# Patient Record
Sex: Female | Born: 1950 | Race: White | Hispanic: No | Marital: Married | Smoking: Former smoker
Health system: Southern US, Community
[De-identification: ages and names within clinical notes are randomized; demographics above are authoritative.]

## PROBLEM LIST (undated history)

## (undated) DIAGNOSIS — F32A Depression, unspecified: Secondary | ICD-10-CM

## (undated) DIAGNOSIS — K5790 Diverticulosis of intestine, part unspecified, without perforation or abscess without bleeding: Secondary | ICD-10-CM

## (undated) DIAGNOSIS — K219 Gastro-esophageal reflux disease without esophagitis: Secondary | ICD-10-CM

## (undated) DIAGNOSIS — M791 Myalgia, unspecified site: Secondary | ICD-10-CM

## (undated) DIAGNOSIS — M2540 Effusion, unspecified joint: Secondary | ICD-10-CM

## (undated) DIAGNOSIS — Z5189 Encounter for other specified aftercare: Secondary | ICD-10-CM

## (undated) DIAGNOSIS — IMO0001 Reserved for inherently not codable concepts without codable children: Secondary | ICD-10-CM

## (undated) DIAGNOSIS — D649 Anemia, unspecified: Secondary | ICD-10-CM

## (undated) DIAGNOSIS — F329 Major depressive disorder, single episode, unspecified: Secondary | ICD-10-CM

## (undated) DIAGNOSIS — M069 Rheumatoid arthritis, unspecified: Secondary | ICD-10-CM

## (undated) DIAGNOSIS — F419 Anxiety disorder, unspecified: Secondary | ICD-10-CM

## (undated) DIAGNOSIS — M797 Fibromyalgia: Secondary | ICD-10-CM

## (undated) DIAGNOSIS — H269 Unspecified cataract: Secondary | ICD-10-CM

## (undated) DIAGNOSIS — M7530 Calcific tendinitis of unspecified shoulder: Secondary | ICD-10-CM

## (undated) HISTORY — DX: Fibromyalgia: M79.7

## (undated) HISTORY — PX: ROTATOR CUFF REPAIR: SHX139

## (undated) HISTORY — DX: Anxiety disorder, unspecified: F41.9

## (undated) HISTORY — DX: Myalgia, unspecified site: M79.10

## (undated) HISTORY — DX: Rheumatoid arthritis, unspecified: M06.9

## (undated) HISTORY — DX: Effusion, unspecified joint: M25.40

## (undated) HISTORY — DX: Gastro-esophageal reflux disease without esophagitis: K21.9

## (undated) HISTORY — PX: OTHER SURGICAL HISTORY: SHX169

## (undated) HISTORY — PX: TUBAL LIGATION: SHX77

## (undated) HISTORY — PX: APPENDECTOMY: SHX54

## (undated) HISTORY — DX: Encounter for other specified aftercare: Z51.89

## (undated) HISTORY — DX: Anemia, unspecified: D64.9

## (undated) HISTORY — DX: Major depressive disorder, single episode, unspecified: F32.9

## (undated) HISTORY — PX: FOOT FRACTURE SURGERY: SHX645

## (undated) HISTORY — DX: Diverticulosis of intestine, part unspecified, without perforation or abscess without bleeding: K57.90

## (undated) HISTORY — PX: TOTAL ABDOMINAL HYSTERECTOMY: SHX209

## (undated) HISTORY — DX: Reserved for inherently not codable concepts without codable children: IMO0001

## (undated) HISTORY — PX: TONSILLECTOMY: SUR1361

## (undated) HISTORY — PX: REPLACEMENT TOTAL KNEE: SUR1224

## (undated) HISTORY — DX: Depression, unspecified: F32.A

## (undated) HISTORY — DX: Calcific tendinitis of unspecified shoulder: M75.30

---

## 1997-09-07 ENCOUNTER — Encounter (HOSPITAL_COMMUNITY): Admission: RE | Admit: 1997-09-07 | Discharge: 1997-12-06 | Payer: Self-pay | Admitting: Dermatology

## 1997-12-12 ENCOUNTER — Encounter (HOSPITAL_COMMUNITY): Admission: RE | Admit: 1997-12-12 | Discharge: 1998-03-12 | Payer: Self-pay | Admitting: Dermatology

## 1998-03-25 ENCOUNTER — Emergency Department (HOSPITAL_COMMUNITY): Admission: EM | Admit: 1998-03-25 | Discharge: 1998-03-25 | Payer: Self-pay | Admitting: Emergency Medicine

## 1999-05-24 ENCOUNTER — Encounter: Payer: Self-pay | Admitting: Urology

## 1999-05-24 ENCOUNTER — Observation Stay (HOSPITAL_COMMUNITY): Admission: RE | Admit: 1999-05-24 | Discharge: 1999-05-25 | Payer: Self-pay | Admitting: Urology

## 1999-08-01 ENCOUNTER — Encounter: Admission: RE | Admit: 1999-08-01 | Discharge: 1999-08-01 | Payer: Self-pay | Admitting: Unknown Physician Specialty

## 1999-09-06 ENCOUNTER — Encounter: Admission: RE | Admit: 1999-09-06 | Discharge: 1999-09-06 | Payer: Self-pay

## 2000-03-02 ENCOUNTER — Encounter: Admission: RE | Admit: 2000-03-02 | Discharge: 2000-03-02 | Payer: Self-pay

## 2000-06-08 ENCOUNTER — Encounter: Payer: Self-pay | Admitting: Orthopedic Surgery

## 2000-06-08 ENCOUNTER — Encounter: Admission: RE | Admit: 2000-06-08 | Discharge: 2000-06-08 | Payer: Self-pay | Admitting: Gastroenterology

## 2000-06-19 ENCOUNTER — Encounter (INDEPENDENT_AMBULATORY_CARE_PROVIDER_SITE_OTHER): Payer: Self-pay | Admitting: Specialist

## 2000-06-19 ENCOUNTER — Ambulatory Visit (HOSPITAL_COMMUNITY): Admission: RE | Admit: 2000-06-19 | Discharge: 2000-06-19 | Payer: Self-pay | Admitting: Oncology

## 2000-08-11 ENCOUNTER — Encounter: Admission: RE | Admit: 2000-08-11 | Discharge: 2000-08-11 | Payer: Self-pay

## 2000-11-11 ENCOUNTER — Encounter: Admission: RE | Admit: 2000-11-11 | Discharge: 2000-11-11 | Payer: Self-pay

## 2001-03-09 ENCOUNTER — Encounter: Admission: RE | Admit: 2001-03-09 | Discharge: 2001-03-09 | Payer: Self-pay

## 2001-12-03 ENCOUNTER — Encounter: Admission: RE | Admit: 2001-12-03 | Discharge: 2001-12-03 | Payer: Self-pay

## 2002-08-05 ENCOUNTER — Encounter: Admission: RE | Admit: 2002-08-05 | Discharge: 2002-08-05 | Payer: Self-pay

## 2003-02-16 ENCOUNTER — Ambulatory Visit (HOSPITAL_COMMUNITY): Admission: RE | Admit: 2003-02-16 | Discharge: 2003-02-16 | Payer: Self-pay | Admitting: Oncology

## 2003-02-16 ENCOUNTER — Encounter (INDEPENDENT_AMBULATORY_CARE_PROVIDER_SITE_OTHER): Payer: Self-pay | Admitting: Specialist

## 2003-04-17 ENCOUNTER — Encounter: Admission: RE | Admit: 2003-04-17 | Discharge: 2003-04-17 | Payer: Self-pay

## 2003-10-03 ENCOUNTER — Encounter: Admission: RE | Admit: 2003-10-03 | Discharge: 2003-10-03 | Payer: Self-pay

## 2003-12-16 ENCOUNTER — Ambulatory Visit (HOSPITAL_COMMUNITY): Admission: AD | Admit: 2003-12-16 | Discharge: 2003-12-18 | Payer: Self-pay | Admitting: Orthopedic Surgery

## 2005-02-17 ENCOUNTER — Encounter: Admission: RE | Admit: 2005-02-17 | Discharge: 2005-02-17 | Payer: Self-pay

## 2005-03-10 ENCOUNTER — Encounter: Admission: RE | Admit: 2005-03-10 | Discharge: 2005-03-10 | Payer: Self-pay

## 2005-04-21 ENCOUNTER — Emergency Department (HOSPITAL_COMMUNITY): Admission: EM | Admit: 2005-04-21 | Discharge: 2005-04-22 | Payer: Self-pay | Admitting: Emergency Medicine

## 2005-07-24 ENCOUNTER — Encounter: Admission: RE | Admit: 2005-07-24 | Discharge: 2005-07-24 | Payer: Self-pay

## 2005-10-24 ENCOUNTER — Encounter (HOSPITAL_COMMUNITY): Admission: RE | Admit: 2005-10-24 | Discharge: 2006-01-22 | Payer: Self-pay

## 2006-03-06 ENCOUNTER — Encounter (HOSPITAL_COMMUNITY): Admission: RE | Admit: 2006-03-06 | Discharge: 2006-06-04 | Payer: Self-pay

## 2006-04-06 ENCOUNTER — Ambulatory Visit: Payer: Self-pay | Admitting: Gastroenterology

## 2006-04-06 ENCOUNTER — Encounter: Admission: RE | Admit: 2006-04-06 | Discharge: 2006-04-06 | Payer: Self-pay

## 2006-04-09 ENCOUNTER — Ambulatory Visit: Payer: Self-pay | Admitting: Gastroenterology

## 2006-04-21 ENCOUNTER — Encounter: Admission: RE | Admit: 2006-04-21 | Discharge: 2006-04-21 | Payer: Self-pay

## 2006-05-11 ENCOUNTER — Ambulatory Visit: Payer: Self-pay | Admitting: Gastroenterology

## 2006-10-30 ENCOUNTER — Ambulatory Visit (HOSPITAL_BASED_OUTPATIENT_CLINIC_OR_DEPARTMENT_OTHER): Admission: RE | Admit: 2006-10-30 | Discharge: 2006-10-30 | Payer: Self-pay | Admitting: Orthopedic Surgery

## 2007-02-05 ENCOUNTER — Ambulatory Visit (HOSPITAL_COMMUNITY): Admission: RE | Admit: 2007-02-05 | Discharge: 2007-02-05 | Payer: Self-pay

## 2008-01-22 ENCOUNTER — Emergency Department (HOSPITAL_COMMUNITY): Admission: EM | Admit: 2008-01-22 | Discharge: 2008-01-22 | Payer: Self-pay | Admitting: Emergency Medicine

## 2008-04-05 ENCOUNTER — Ambulatory Visit: Payer: Self-pay | Admitting: Oncology

## 2008-04-20 LAB — CBC WITH DIFFERENTIAL/PLATELET
Basophils Absolute: 0 10*3/uL (ref 0.0–0.1)
EOS%: 0 % (ref 0.0–7.0)
Eosinophils Absolute: 0 10*3/uL (ref 0.0–0.5)
HCT: 34.9 % (ref 34.8–46.6)
HGB: 12.2 g/dL (ref 11.6–15.9)
MCH: 30.8 pg (ref 26.0–34.0)
MONO#: 0.4 10*3/uL (ref 0.1–0.9)
NEUT#: 0.3 10*3/uL — CL (ref 1.5–6.5)
NEUT%: 16.6 % — ABNORMAL LOW (ref 39.6–76.8)
RDW: 14.3 % (ref 11.3–14.5)
WBC: 1.9 10*3/uL — ABNORMAL LOW (ref 3.9–10.0)
lymph#: 1.2 10*3/uL (ref 0.9–3.3)

## 2008-04-20 LAB — MORPHOLOGY
PLT EST: ADEQUATE
RBC Comments: NORMAL

## 2008-04-24 LAB — COMPREHENSIVE METABOLIC PANEL
ALT: 21 U/L (ref 0–35)
Alkaline Phosphatase: 71 U/L (ref 39–117)
Glucose, Bld: 122 mg/dL — ABNORMAL HIGH (ref 70–99)
Sodium: 134 mEq/L — ABNORMAL LOW (ref 135–145)
Total Bilirubin: 0.5 mg/dL (ref 0.3–1.2)
Total Protein: 7.2 g/dL (ref 6.0–8.3)

## 2008-04-24 LAB — ANA: Anti Nuclear Antibody(ANA): NEGATIVE

## 2008-05-01 LAB — CBC WITH DIFFERENTIAL/PLATELET
Eosinophils Absolute: 0 10*3/uL (ref 0.0–0.5)
LYMPH%: 48.4 % — ABNORMAL HIGH (ref 14.0–48.0)
MONO#: 0.5 10*3/uL (ref 0.1–0.9)
NEUT#: 0.6 10*3/uL — ABNORMAL LOW (ref 1.5–6.5)
Platelets: 215 10*3/uL (ref 145–400)
RBC: 4.31 10*6/uL (ref 3.70–5.32)
WBC: 2.1 10*3/uL — ABNORMAL LOW (ref 3.9–10.0)

## 2008-05-03 LAB — CBC WITH DIFFERENTIAL/PLATELET
Basophils Absolute: 0 10*3/uL (ref 0.0–0.1)
Eosinophils Absolute: 0 10*3/uL (ref 0.0–0.5)
HCT: 38.2 % (ref 34.8–46.6)
LYMPH%: 59.3 % — ABNORMAL HIGH (ref 14.0–48.0)
MONO#: 0.5 10*3/uL (ref 0.1–0.9)
NEUT#: 0.6 10*3/uL — ABNORMAL LOW (ref 1.5–6.5)
NEUT%: 22.4 % — ABNORMAL LOW (ref 39.6–76.8)
Platelets: 217 10*3/uL (ref 145–400)
WBC: 2.9 10*3/uL — ABNORMAL LOW (ref 3.9–10.0)

## 2008-05-24 ENCOUNTER — Ambulatory Visit: Payer: Self-pay | Admitting: Oncology

## 2008-05-26 LAB — CBC WITH DIFFERENTIAL/PLATELET
BASO%: 0.7 % (ref 0.0–2.0)
HCT: 36.2 % (ref 34.8–46.6)
LYMPH%: 61.2 % — ABNORMAL HIGH (ref 14.0–48.0)
MCHC: 35.1 g/dL (ref 32.0–36.0)
MCV: 89.3 fL (ref 81.0–101.0)
MONO%: 18.6 % — ABNORMAL HIGH (ref 0.0–13.0)
NEUT%: 19.5 % — ABNORMAL LOW (ref 39.6–76.8)
Platelets: 199 10*3/uL (ref 145–400)
RBC: 4.06 10*6/uL (ref 3.70–5.32)

## 2008-05-31 LAB — CBC WITH DIFFERENTIAL/PLATELET
BASO%: 0.5 % (ref 0.0–2.0)
EOS%: 0 % (ref 0.0–7.0)
LYMPH%: 51.3 % — ABNORMAL HIGH (ref 14.0–48.0)
MCH: 30.9 pg (ref 26.0–34.0)
MCHC: 34.6 g/dL (ref 32.0–36.0)
MONO#: 0.2 10*3/uL (ref 0.1–0.9)
NEUT%: 39.1 % — ABNORMAL LOW (ref 39.6–76.8)
Platelets: 236 10*3/uL (ref 145–400)
RBC: 4.18 10*6/uL (ref 3.70–5.32)
WBC: 2 10*3/uL — ABNORMAL LOW (ref 3.9–10.0)
lymph#: 1 10*3/uL (ref 0.9–3.3)

## 2008-06-07 LAB — CBC WITH DIFFERENTIAL/PLATELET
BASO%: 0.6 % (ref 0.0–2.0)
EOS%: 0.1 % (ref 0.0–7.0)
HCT: 37.9 % (ref 34.8–46.6)
MCH: 31.4 pg (ref 26.0–34.0)
MCHC: 34.9 g/dL (ref 32.0–36.0)
MONO#: 0.2 10*3/uL (ref 0.1–0.9)
NEUT%: 49 % (ref 39.6–76.8)
RDW: 15.4 % — ABNORMAL HIGH (ref 11.3–14.5)
WBC: 2.4 10*3/uL — ABNORMAL LOW (ref 3.9–10.0)
lymph#: 1 10*3/uL (ref 0.9–3.3)

## 2008-06-15 LAB — COMPREHENSIVE METABOLIC PANEL
ALT: 15 U/L (ref 0–35)
AST: 12 U/L (ref 0–37)
Albumin: 4.2 g/dL (ref 3.5–5.2)
Alkaline Phosphatase: 38 U/L — ABNORMAL LOW (ref 39–117)
Potassium: 4 mEq/L (ref 3.5–5.3)
Sodium: 138 mEq/L (ref 135–145)
Total Protein: 7.2 g/dL (ref 6.0–8.3)

## 2008-06-15 LAB — CBC WITH DIFFERENTIAL/PLATELET
BASO%: 0.3 % (ref 0.0–2.0)
EOS%: 0.1 % (ref 0.0–7.0)
Eosinophils Absolute: 0 10*3/uL (ref 0.0–0.5)
MCHC: 34.6 g/dL (ref 32.0–36.0)
MCV: 90 fL (ref 81.0–101.0)
MONO%: 5.7 % (ref 0.0–13.0)
NEUT#: 3.2 10*3/uL (ref 1.5–6.5)
RBC: 4.52 10*6/uL (ref 3.70–5.32)
RDW: 14.6 % — ABNORMAL HIGH (ref 11.3–14.5)

## 2008-06-19 ENCOUNTER — Inpatient Hospital Stay (HOSPITAL_COMMUNITY): Admission: RE | Admit: 2008-06-19 | Discharge: 2008-06-22 | Payer: Self-pay | Admitting: Orthopedic Surgery

## 2008-06-29 ENCOUNTER — Ambulatory Visit: Payer: Self-pay | Admitting: Oncology

## 2008-08-01 LAB — CBC WITH DIFFERENTIAL/PLATELET
Basophils Absolute: 0 10*3/uL (ref 0.0–0.1)
EOS%: 0.2 % (ref 0.0–7.0)
HGB: 12.7 g/dL (ref 11.6–15.9)
LYMPH%: 67.1 % — ABNORMAL HIGH (ref 14.0–49.7)
MCH: 30.6 pg (ref 25.1–34.0)
MCV: 88.6 fL (ref 79.5–101.0)
MONO%: 11.7 % (ref 0.0–14.0)
RBC: 4.16 10*6/uL (ref 3.70–5.45)
RDW: 14.4 % (ref 11.2–14.5)

## 2008-08-15 ENCOUNTER — Ambulatory Visit: Payer: Self-pay | Admitting: Oncology

## 2008-08-15 LAB — CBC WITH DIFFERENTIAL/PLATELET
BASO%: 0.8 % (ref 0.0–2.0)
EOS%: 0 % (ref 0.0–7.0)
Eosinophils Absolute: 0 10*3/uL (ref 0.0–0.5)
HGB: 12.4 g/dL (ref 11.6–15.9)
LYMPH%: 38.9 % (ref 14.0–49.7)
MCV: 85.6 fL (ref 79.5–101.0)
MONO#: 0.2 10*3/uL (ref 0.1–0.9)
NEUT#: 1.2 10*3/uL — ABNORMAL LOW (ref 1.5–6.5)
NEUT%: 51.9 % (ref 38.4–76.8)
RBC: 4.18 10*6/uL (ref 3.70–5.45)
RDW: 14.5 % (ref 11.2–14.5)
WBC: 2.3 10*3/uL — ABNORMAL LOW (ref 3.9–10.3)

## 2008-08-15 LAB — COMPREHENSIVE METABOLIC PANEL
ALT: <8 U/L (ref 0–35)
AST: 10 U/L (ref 0–37)
Creatinine, Ser: 0.72 mg/dL (ref 0.40–1.20)
Total Bilirubin: 0.5 mg/dL (ref 0.3–1.2)

## 2008-09-01 LAB — CBC WITH DIFFERENTIAL/PLATELET
Basophils Absolute: 0 10*3/uL (ref 0.0–0.1)
EOS%: 0.1 % (ref 0.0–7.0)
HCT: 32.6 % — ABNORMAL LOW (ref 34.8–46.6)
HGB: 11.2 g/dL — ABNORMAL LOW (ref 11.6–15.9)
MCH: 29 pg (ref 25.1–34.0)
MCV: 84.6 fL (ref 79.5–101.0)
MONO%: 17.8 % — ABNORMAL HIGH (ref 0.0–14.0)
NEUT%: 19.6 % — ABNORMAL LOW (ref 38.4–76.8)

## 2008-09-29 ENCOUNTER — Ambulatory Visit: Payer: Self-pay | Admitting: Oncology

## 2008-10-03 LAB — CBC WITH DIFFERENTIAL/PLATELET
Basophils Absolute: 0 10*3/uL (ref 0.0–0.1)
EOS%: 0.1 % (ref 0.0–7.0)
Eosinophils Absolute: 0 10*3/uL (ref 0.0–0.5)
HGB: 12.2 g/dL (ref 11.6–15.9)
NEUT#: 0.8 10*3/uL — ABNORMAL LOW (ref 1.5–6.5)
RBC: 4.26 10*6/uL (ref 3.70–5.45)
RDW: 15.7 % — ABNORMAL HIGH (ref 11.2–14.5)
lymph#: 1.9 10*3/uL (ref 0.9–3.3)

## 2008-10-24 LAB — CBC WITH DIFFERENTIAL/PLATELET
BASO%: 0.7 % (ref 0.0–2.0)
EOS%: 0 % (ref 0.0–7.0)
MCH: 28.4 pg (ref 25.1–34.0)
MCHC: 34 g/dL (ref 31.5–36.0)
MONO%: 18.5 % — ABNORMAL HIGH (ref 0.0–14.0)
RBC: 4.01 10*6/uL (ref 3.70–5.45)
RDW: 15.3 % — ABNORMAL HIGH (ref 11.2–14.5)
lymph#: 1.7 10*3/uL (ref 0.9–3.3)

## 2008-11-10 ENCOUNTER — Ambulatory Visit: Payer: Self-pay | Admitting: Oncology

## 2008-11-14 LAB — CBC WITH DIFFERENTIAL/PLATELET
BASO%: 0.3 % (ref 0.0–2.0)
HCT: 36.2 % (ref 34.8–46.6)
MCHC: 34.7 g/dL (ref 31.5–36.0)
MONO#: 0.7 10*3/uL (ref 0.1–0.9)
NEUT%: 5.5 % — ABNORMAL LOW (ref 38.4–76.8)
WBC: 4.8 10*3/uL (ref 3.9–10.3)
lymph#: 3.9 10*3/uL — ABNORMAL HIGH (ref 0.9–3.3)

## 2008-11-14 LAB — COMPREHENSIVE METABOLIC PANEL
ALT: 10 U/L (ref 0–35)
Albumin: 4 g/dL (ref 3.5–5.2)
CO2: 27 mEq/L (ref 19–32)
Calcium: 9.3 mg/dL (ref 8.4–10.5)
Chloride: 104 mEq/L (ref 96–112)
Creatinine, Ser: 0.62 mg/dL (ref 0.40–1.20)
Potassium: 4.1 mEq/L (ref 3.5–5.3)
Sodium: 140 mEq/L (ref 135–145)
Total Protein: 7.5 g/dL (ref 6.0–8.3)

## 2008-11-14 LAB — LACTATE DEHYDROGENASE: LDH: 136 U/L (ref 94–250)

## 2009-02-09 ENCOUNTER — Ambulatory Visit: Payer: Self-pay | Admitting: Oncology

## 2009-02-14 LAB — CBC WITH DIFFERENTIAL/PLATELET
BASO%: 0.6 % (ref 0.0–2.0)
LYMPH%: 72.4 % — ABNORMAL HIGH (ref 14.0–49.7)
MCHC: 34.5 g/dL (ref 31.5–36.0)
MONO#: 0.5 10*3/uL (ref 0.1–0.9)
Platelets: 191 10*3/uL (ref 145–400)
RBC: 3.92 10*6/uL (ref 3.70–5.45)
WBC: 2.9 10*3/uL — ABNORMAL LOW (ref 3.9–10.3)
lymph#: 2.1 10*3/uL (ref 0.9–3.3)

## 2009-04-18 ENCOUNTER — Encounter: Admission: RE | Admit: 2009-04-18 | Discharge: 2009-04-18 | Payer: Self-pay

## 2009-05-10 ENCOUNTER — Ambulatory Visit: Payer: Self-pay | Admitting: Oncology

## 2009-06-21 ENCOUNTER — Encounter: Admission: RE | Admit: 2009-06-21 | Discharge: 2009-06-21 | Payer: Self-pay | Admitting: Internal Medicine

## 2009-07-03 ENCOUNTER — Ambulatory Visit: Payer: Self-pay | Admitting: Oncology

## 2009-07-05 LAB — CBC WITH DIFFERENTIAL/PLATELET
BASO%: 0.6 % (ref 0.0–2.0)
Basophils Absolute: 0 10*3/uL (ref 0.0–0.1)
EOS%: 0.3 % (ref 0.0–7.0)
Eosinophils Absolute: 0 10*3/uL (ref 0.0–0.5)
HCT: 35.9 % (ref 34.8–46.6)
HGB: 11.9 g/dL (ref 11.6–15.9)
LYMPH%: 83.1 % — ABNORMAL HIGH (ref 14.0–49.7)
MCH: 28 pg (ref 25.1–34.0)
MCHC: 33.3 g/dL (ref 31.5–36.0)
MCV: 84 fL (ref 79.5–101.0)
MONO#: 0.5 10*3/uL (ref 0.1–0.9)
MONO%: 8.7 % (ref 0.0–14.0)
NEUT#: 0.4 10*3/uL — CL (ref 1.5–6.5)
NEUT%: 7.3 % — ABNORMAL LOW (ref 38.4–76.8)
Platelets: 222 10*3/uL (ref 145–400)
RBC: 4.27 10*6/uL (ref 3.70–5.45)
RDW: 17.1 % — ABNORMAL HIGH (ref 11.2–14.5)
WBC: 5.4 10*3/uL (ref 3.9–10.3)
lymph#: 4.5 10*3/uL — ABNORMAL HIGH (ref 0.9–3.3)

## 2009-07-05 LAB — COMPREHENSIVE METABOLIC PANEL
ALT: 9 U/L (ref 0–35)
AST: 11 U/L (ref 0–37)
Albumin: 3.6 g/dL (ref 3.5–5.2)
Alkaline Phosphatase: 52 U/L (ref 39–117)
BUN: 6 mg/dL (ref 6–23)
CO2: 28 mEq/L (ref 19–32)
Calcium: 9 mg/dL (ref 8.4–10.5)
Chloride: 101 mEq/L (ref 96–112)
Creatinine, Ser: 0.69 mg/dL (ref 0.40–1.20)
Glucose, Bld: 116 mg/dL — ABNORMAL HIGH (ref 70–99)
Potassium: 4.1 mEq/L (ref 3.5–5.3)
Sodium: 140 mEq/L (ref 135–145)
Total Bilirubin: 0.4 mg/dL (ref 0.3–1.2)
Total Protein: 6.9 g/dL (ref 6.0–8.3)

## 2009-07-05 LAB — LACTATE DEHYDROGENASE: LDH: 113 U/L (ref 94–250)

## 2010-01-01 ENCOUNTER — Ambulatory Visit: Payer: Self-pay | Admitting: Oncology

## 2010-01-14 LAB — CBC WITH DIFFERENTIAL/PLATELET
Basophils Absolute: 0 10*3/uL (ref 0.0–0.1)
EOS%: 0 % (ref 0.0–7.0)
HCT: 34 % — ABNORMAL LOW (ref 34.8–46.6)
HGB: 11.7 g/dL (ref 11.6–15.9)
LYMPH%: 82.6 % — ABNORMAL HIGH (ref 14.0–49.7)
MCH: 29.4 pg (ref 25.1–34.0)
MCV: 85.7 fL (ref 79.5–101.0)
MONO%: 12.8 % (ref 0.0–14.0)
NEUT%: 4 % — ABNORMAL LOW (ref 38.4–76.8)

## 2010-06-04 ENCOUNTER — Encounter
Admission: RE | Admit: 2010-06-04 | Discharge: 2010-06-04 | Payer: Self-pay | Source: Home / Self Care | Attending: Family Medicine | Admitting: Family Medicine

## 2010-06-21 ENCOUNTER — Ambulatory Visit: Payer: Self-pay | Admitting: Oncology

## 2010-06-25 LAB — COMPREHENSIVE METABOLIC PANEL
ALT: 19 U/L (ref 0–35)
AST: 17 U/L (ref 0–37)
Albumin: 3.8 g/dL (ref 3.5–5.2)
Alkaline Phosphatase: 56 U/L (ref 39–117)
BUN: 11 mg/dL (ref 6–23)
CO2: 30 mEq/L (ref 19–32)
Calcium: 8.8 mg/dL (ref 8.4–10.5)
Chloride: 100 mEq/L (ref 96–112)
Creatinine, Ser: 0.73 mg/dL (ref 0.40–1.20)
Glucose, Bld: 120 mg/dL — ABNORMAL HIGH (ref 70–99)
Potassium: 3.6 mEq/L (ref 3.5–5.3)
Sodium: 138 mEq/L (ref 135–145)
Total Bilirubin: 0.5 mg/dL (ref 0.3–1.2)
Total Protein: 7.7 g/dL (ref 6.0–8.3)

## 2010-06-25 LAB — CBC WITH DIFFERENTIAL/PLATELET
BASO%: 0.5 % (ref 0.0–2.0)
Basophils Absolute: 0 10*3/uL (ref 0.0–0.1)
EOS%: 0 % (ref 0.0–7.0)
Eosinophils Absolute: 0 10*3/uL (ref 0.0–0.5)
HCT: 38.4 % (ref 34.8–46.6)
HGB: 13.2 g/dL (ref 11.6–15.9)
LYMPH%: 85.6 % — ABNORMAL HIGH (ref 14.0–49.7)
MCH: 29.3 pg (ref 25.1–34.0)
MCHC: 34.4 g/dL (ref 31.5–36.0)
MCV: 85.2 fL (ref 79.5–101.0)
MONO#: 0.5 10*3/uL (ref 0.1–0.9)
MONO%: 10.4 % (ref 0.0–14.0)
NEUT#: 0.2 10*3/uL — CL (ref 1.5–6.5)
NEUT%: 3.5 % — ABNORMAL LOW (ref 38.4–76.8)
Platelets: 147 10*3/uL (ref 145–400)
RBC: 4.51 10*6/uL (ref 3.70–5.45)
RDW: 15.4 % — ABNORMAL HIGH (ref 11.2–14.5)
WBC: 4.7 10*3/uL (ref 3.9–10.3)
lymph#: 4 10*3/uL — ABNORMAL HIGH (ref 0.9–3.3)

## 2010-06-25 LAB — TECHNOLOGIST REVIEW

## 2010-06-25 LAB — LACTATE DEHYDROGENASE: LDH: 126 U/L (ref 94–250)

## 2010-07-15 ENCOUNTER — Emergency Department (HOSPITAL_COMMUNITY)
Admission: EM | Admit: 2010-07-15 | Discharge: 2010-07-15 | Disposition: A | Discharge status: Home / Self Care | Payer: Medicare Other | Attending: Urology | Admitting: Urology

## 2010-07-15 ENCOUNTER — Encounter (HOSPITAL_COMMUNITY): Payer: Self-pay | Admitting: Radiology

## 2010-07-15 ENCOUNTER — Inpatient Hospital Stay: Admission: AD | Admit: 2010-07-15 | Payer: Self-pay | Source: Ambulatory Visit | Admitting: Family Medicine

## 2010-07-15 ENCOUNTER — Emergency Department (HOSPITAL_COMMUNITY): Payer: Medicare Other

## 2010-07-15 DIAGNOSIS — IMO0001 Reserved for inherently not codable concepts without codable children: Secondary | ICD-10-CM | POA: Insufficient documentation

## 2010-07-15 DIAGNOSIS — K219 Gastro-esophageal reflux disease without esophagitis: Secondary | ICD-10-CM | POA: Insufficient documentation

## 2010-07-15 DIAGNOSIS — M069 Rheumatoid arthritis, unspecified: Secondary | ICD-10-CM | POA: Insufficient documentation

## 2010-07-15 DIAGNOSIS — J3489 Other specified disorders of nose and nasal sinuses: Secondary | ICD-10-CM | POA: Insufficient documentation

## 2010-07-15 DIAGNOSIS — J029 Acute pharyngitis, unspecified: Secondary | ICD-10-CM | POA: Insufficient documentation

## 2010-07-15 DIAGNOSIS — K1370 Unspecified lesions of oral mucosa: Secondary | ICD-10-CM | POA: Insufficient documentation

## 2010-07-15 DIAGNOSIS — R05 Cough: Secondary | ICD-10-CM | POA: Insufficient documentation

## 2010-07-15 DIAGNOSIS — R059 Cough, unspecified: Secondary | ICD-10-CM | POA: Insufficient documentation

## 2010-07-15 DIAGNOSIS — J329 Chronic sinusitis, unspecified: Secondary | ICD-10-CM | POA: Insufficient documentation

## 2010-07-15 DIAGNOSIS — R509 Fever, unspecified: Secondary | ICD-10-CM | POA: Insufficient documentation

## 2010-07-15 LAB — BASIC METABOLIC PANEL
BUN: 6 mg/dL (ref 6–23)
CO2: 24 mEq/L (ref 19–32)
Calcium: 8.6 mg/dL (ref 8.4–10.5)
Chloride: 98 mEq/L (ref 96–112)
Creatinine, Ser: 0.8 mg/dL (ref 0.4–1.2)
GFR calc Af Amer: >60 mL/min (ref >60)
GFR calc non Af Amer: >60 mL/min (ref >60)
Glucose, Bld: 160 mg/dL — ABNORMAL HIGH (ref 70–99)
Potassium: 3.5 mEq/L (ref 3.5–5.1)
Sodium: 133 mEq/L — ABNORMAL LOW (ref 135–145)

## 2010-07-15 LAB — CBC
HCT: 34.1 % — ABNORMAL LOW (ref 36.0–46.0)
Hemoglobin: 11.6 g/dL — ABNORMAL LOW (ref 12.0–15.0)
MCH: 27.9 pg (ref 26.0–34.0)
MCHC: 34 g/dL (ref 30.0–36.0)
MCV: 82 fL (ref 78.0–100.0)
Platelets: 99 10*3/uL — ABNORMAL LOW (ref 150–400)
RBC: 4.16 MIL/uL (ref 3.87–5.11)
RDW: 14.6 % (ref 11.5–15.5)
WBC: 5.4 10*3/uL (ref 4.0–10.5)

## 2010-07-15 LAB — DIFFERENTIAL
Basophils Absolute: 0 10*3/uL (ref 0.0–0.1)
Basophils Relative: 0 % (ref 0–1)
Eosinophils Absolute: 0 10*3/uL (ref 0.0–0.7)
Eosinophils Relative: 0 % (ref 0–5)
Lymphocytes Relative: 70 % — ABNORMAL HIGH (ref 12–46)
Lymphs Abs: 3.8 10*3/uL (ref 0.7–4.0)
Monocytes Absolute: 1.3 10*3/uL — ABNORMAL HIGH (ref 0.1–1.0)
Monocytes Relative: 24 % — ABNORMAL HIGH (ref 3–12)
Neutro Abs: 0.3 10*3/uL — ABNORMAL LOW (ref 1.7–7.7)
Neutrophils Relative %: 6 % — ABNORMAL LOW (ref 43–77)

## 2010-07-15 MED ORDER — IOHEXOL 300 MG/ML  SOLN: 100.0000 mL | Freq: Once | INTRAMUSCULAR | Status: DC | PRN

## 2010-07-16 LAB — PATHOLOGIST SMEAR REVIEW

## 2010-08-27 ENCOUNTER — Other Ambulatory Visit: Payer: Self-pay | Admitting: Dermatology

## 2010-09-03 NOTE — Consult Note (Signed)
NAMECHLOIE, LONEY            ACCOUNT NO.:  [id number]  MEDICAL RECORD NO.:  [id number]           PATIENT TYPE:  E  LOCATION:  [facility]                         FACILITY:  [facility]  PHYSICIAN:  Dr. Jennette Kettle               DATE OF BIRTH:  [birth year]  DATE OF CONSULTATION: DATE OF DISCHARGE:  07/15/2010                                CONSULTATION   CHIEF COMPLAINT:  Throat hurts.  HISTORY OF PRESENT ILLNESS:  The patient is a 60 year old female who presents from Urgent Care with mouth/throat pain.  The pain began 5 days ago with no provocation (she awoke with it) and has progressively worsened.  For the past 3 days, she has had intolerance of non-cold foods secondary to pain.  Yesterday, she was seen at [facility] for this and was diagnosed with the soft palate infection and sent home on clindamycin 300 mg by mouth every 6 hours.  She returned today and was felt to have worsened.  Accordingly, she was sent to the [facility] Emergency Department for further evaluation with a CT of the head and neck.  This per the request of Dr. Jenne Pane who was consulted by Dr. Ephriam Jenkins ( the physician who saw the patient at [facility]).  The patient passes fevers, frontal sinus pain, watering nonpainful eyes, and productive cough, but denies eye pain, photophobia, blurred vision, hemoptysis, or other systemic complaints.  ALLERGIES:  PENICILLIN.  MEDICATIONS: 1. Prednisone 5 mg p.o. b.i.d. 2. Ultram 50-200 mg p.o. daily p.r.n. 3. Omeprazole 20 mg p.o. daily. 4. Lyrica 200 mg p.o. b.i.d. 5. Fentanyl patch 50 mcg every 3 days. 6. Flexeril 10 mg by mouth q.h.s. 7. Nortriptyline 75 mg p.o. q.h.s.  PAST MEDICAL HISTORY: 1. Rheumatoid arthritis, on chronic prednisone. 2. History of cardiac arrhythmia. 3. Gastroesophageal reflux disease. 4. Urinary incontinence. 5. Osteoporosis. 6. Fibromyalgia. 7. Chronic fatigue syndrome. 8. History of asthma. 9. History of the irritable bowel syndrome. 10.History of  leukopenia.  FAMILY HISTORY:  Heart problems in both parents, otherwise noncontributory.  SOCIAL HISTORY:  Social alcohol use.  Denies tobacco or illicit drug use.  He is currently on disability.  REVIEW OF SYSTEMS:  Per HPI, otherwise, 12-point review of systems was performed and was negative.  PHYSICAL EXAMINATION:  VITAL SIGNS:  Pulse 120, blood pressure 101/65, respiratory rate 18, O2 sats 99% on room air, current temperature is 100.7 degrees Fahrenheit. GENERAL:  No acute distress. CARDIOVASCULAR:  Regular rate and rhythm, 2+ pulses. RESPIRATORY:  Clear to auscultation bilaterally. ABDOMEN:  Soft, nontender, nondistended. EXTREMITIES:  No edema. HEENT:  Eyes, red and watery, no pain to palpation or photophobia. Discomfort with palpation of the frontal sinuses.  A 1-2 mm white lesion in the right posterior hallux, no drainage.  Uvula is midline, no tonsillar exudate but some generalized tonsillar erythema and pharyngeal erythema. NECK:  Has full range of motion.  Slight pain with palpation of right submandibular gland, otherwise, there is no pain with palpation or significant lymphadenopathy.  LABORATORY DATA:  CBC shows white count 5.4, hemoglobin 11.6, platelets 99, absolute eosinophil count of 300 which  is within the patient's normal range.  Basic metabolic demonstrates sodium 133, potassium 3.5, chloride 28, bicarb 24, BUN 6, creatinine 0.8, glucose 160, calcium 8.6. CT of the head and neck demonstrates chronic paranasal sinusitis, mild hypertrophy of the palatine tonsils without tonsillar abscess, atherosclerosis of the left carotid bulb and proximal left internal carotid artery, and some spinal degenerative disk disease.  ASSESSMENT AND PLAN:  This is a 60 year old female here with chronic sinusitis and small posterior palate lesion.  The patient is nontoxic appearing with stable vitals, so I feel that this is way too soon to call this treatment failure.  With a  reassuring CT scan, we feel that the patient can be discharged on continued p.o. clindamycin with outpatient ENT referral for sinusitis.  The patient was given 600 mg of IV clindamycin while in the emergency department along with 1 liter of normal saline.  The patient was instructed to return to the doctor for worsening fever, eye pain, problems breathing, or inability to swallow. Prior to discharge, the patient was also given a stress-dose steroid taper, and viscous lidocaine for pain.  The patient was also discharged with p.o. Vicodin for pain.    ______________________________ Majel Homer, MD   ______________________________ Dr. Jennette Kettle    ER/MEDQ  D:  07/18/2010  T:  07/19/2010  Job:  [id number]  cc:   Dr. Jenne Pane  Electronically Signed by Manuela Neptune MD on 07/31/2010 11:02:42 AM Electronically Signed by Denny Levy MD on 09/03/2010 04:32:16 PM

## 2010-09-23 LAB — BASIC METABOLIC PANEL
CO2: 31 mEq/L (ref 19–32)
CO2: 31 mEq/L (ref 19–32)
Calcium: 8.3 mg/dL — ABNORMAL LOW (ref 8.4–10.5)
Chloride: 103 mEq/L (ref 96–112)
Creatinine, Ser: 0.47 mg/dL (ref 0.4–1.2)
GFR calc Af Amer: >60 mL/min (ref >60)
GFR calc Af Amer: >60 mL/min (ref >60)
GFR calc non Af Amer: >60 mL/min (ref >60)
Glucose, Bld: 126 mg/dL — ABNORMAL HIGH (ref 70–99)
Potassium: 4 mEq/L (ref 3.5–5.1)
Potassium: 4.1 mEq/L (ref 3.5–5.1)
Sodium: 137 mEq/L (ref 135–145)

## 2010-09-23 LAB — COMPREHENSIVE METABOLIC PANEL
ALT: 23 U/L (ref 0–35)
AST: 21 U/L (ref 0–37)
Calcium: 8.9 mg/dL (ref 8.4–10.5)
GFR calc Af Amer: >60 mL/min (ref >60)
Glucose, Bld: 107 mg/dL — ABNORMAL HIGH (ref 70–99)
Sodium: 136 mEq/L (ref 135–145)
Total Protein: 6.7 g/dL (ref 6.0–8.3)

## 2010-09-23 LAB — CBC
HCT: 27.5 % — ABNORMAL LOW (ref 36.0–46.0)
HCT: 30.2 % — ABNORMAL LOW (ref 36.0–46.0)
HCT: 30.3 % — ABNORMAL LOW (ref 36.0–46.0)
Hemoglobin: 10.5 g/dL — ABNORMAL LOW (ref 12.0–15.0)
Hemoglobin: 10.5 g/dL — ABNORMAL LOW (ref 12.0–15.0)
Hemoglobin: 9.6 g/dL — ABNORMAL LOW (ref 12.0–15.0)
MCHC: 33.9 g/dL (ref 30.0–36.0)
MCHC: 34.6 g/dL (ref 30.0–36.0)
MCHC: 34.7 g/dL (ref 30.0–36.0)
MCHC: 34.9 g/dL (ref 30.0–36.0)
MCV: 90.5 fL (ref 78.0–100.0)
MCV: 90.6 fL (ref 78.0–100.0)
Platelets: 196 10*3/uL (ref 150–400)
RBC: 3.04 MIL/uL — ABNORMAL LOW (ref 3.87–5.11)
RBC: 3.31 MIL/uL — ABNORMAL LOW (ref 3.87–5.11)
RBC: 3.35 MIL/uL — ABNORMAL LOW (ref 3.87–5.11)
RDW: 13.8 % (ref 11.5–15.5)
RDW: 14.3 % (ref 11.5–15.5)
RDW: 14.6 % (ref 11.5–15.5)
WBC: 3.1 10*3/uL — ABNORMAL LOW (ref 4.0–10.5)

## 2010-09-23 LAB — PROTIME-INR
INR: 1 (ref 0.00–1.49)
INR: 2.8 — ABNORMAL HIGH (ref 0.00–1.49)
Prothrombin Time: 12.5 seconds (ref 11.6–15.2)

## 2010-09-23 LAB — URINALYSIS, ROUTINE W REFLEX MICROSCOPIC
Bilirubin Urine: NEGATIVE
Glucose, UA: NEGATIVE mg/dL
Ketones, ur: NEGATIVE mg/dL
Nitrite: NEGATIVE
Specific Gravity, Urine: 1.02 (ref 1.005–1.030)
pH: 6.5 (ref 5.0–8.0)

## 2010-10-22 NOTE — Op Note (Signed)
[name], [name]            ACCOUNT NO.:  [id number]   MEDICAL RECORD NO.:  [id number]          PATIENT TYPE:  AMB   LOCATION:  DAY                          FACILITY:  [facility]   PHYSICIAN:  Jamison Neighbor, M.D.  DATE OF BIRTH:  [birth year]   DATE OF PROCEDURE:  02/05/2007  DATE OF DISCHARGE:                               OPERATIVE REPORT   PREOPERATIVE DIAGNOSIS:  Malfunctioning InterStim.   POSTOPERATIVE DIAGNOSIS:  Malfunctioning InterStim.   PROCEDURE:  Replacement of first and second stage InterStim with  intraoperative reprogramming and fluoroscopic guidance.   SURGEON:  Jamison Neighbor, M.D.   ANESTHESIA:  Local plus IV sedation.   COMPLICATIONS:  None.   DRAINS:  None.   BRIEF HISTORY:  This 60 year old female had an InterStim placed eight  years ago and excellent response until recently when she came in and  said it was not functioning.  We at first thought that the problem would  be that her battery had worn out.  We checked this though and the  battery appeared to be functional.  The patient could not get adequate  reprogramming and she felt like the device was cutting on and off.  She  is now to undergo revision of the device.  She may require both first  and second stage replacement.  The patient understands the risks and  benefits of the procedure including the possibility of postoperative  infection which would necessitate removal of the new implant.  She gave  full informed consent.   PROCEDURE:  After adequate IV sedation was obtained, the patient was  placed in the prone position.  She had a Foley catheter in place,  received a full 10-minute scrub and paint with Betadine.  She had her  buttocks pulled apart with tape.  The local anesthetic was infiltrated  into the old incision, which was then opened and the device was  identified.  The old IPG was elevated.  This had the lead extender  attached to it.  The lead extender was removed and the lead was  inspected and it was clear that a little bit of the protective covering  for the wire had come loose.  We tested this and there was an excellent  bellows effect and also good dorsiflexion of the great toe, but because  of the fact that this insulating plastic had become worn, it was thought  that that lead could not be utilized.  Initially attempts at placing a  lead on the opposite side were made, thinking that the old lead could be  left in place and the patient would not require the secondary incision.  It should be noted the patient had one of the very old leads that was  placed through a paramedian incision and it was thought that a new  percutaneous lead would be nicer for her.  Several attempts at getting a  good lead placement on the patient's left hand side were unsuccessful  and that effect was never as good as what she had initially.  It turned  out that we could get somewhat of a bellows effect  in the S4 foramen but  the response in the standard S3 foramen was very poor and incision was  made to abandon the left-hand side and proceed with removal of the old  right-sided lead and placement of a new lead on the right-hand side.  Additional infiltration of local anesthetic was performed and the  paramedian incision was opened.  The lead was identified.  The sutures  were cut away and the lead was removed.  A search needle was then placed  down right through that old hole and testing showed an excellent effect.  The quadripolar lead was then placed down through this and was actually  sutured down with a Vicryl stitch to hold it in place.  Testing showed  this worked peripherally with an excellent bellows effect on every lead.  This was then tunneled over to the old pocket.  The pocket was opened  and a new pocket was created deep to this.  That entire area was  irrigated during the procedure in order to wash out any bile foam that  might be in that area.  After the lead had been  tunneled over, it was  connected to the new IPG which was then placed down into the deep  pocket.  The impedance testing showed that all leads appeared to be  functioning normally.  What was a little unusual, however, is that any  time a lead was connected to the case that the impedance was a little  bit off, but any of the bipolar connections were perfect.  The lead was  left down within the new deep pocket.  The back of the old pocket was  then closed over top of the new IPG.  The pocket was then closed to  close the dead space.  The old incisions were closed with surgical  staples.  The patient tolerated the procedure and was taken to the  recovery room in good condition.  She will be sent home with Tylox and  Keflex and return to see me in one week.      Jamison Neighbor, M.D.  Electronically Signed     RJE/MEDQ  D:  02/05/2007  T:  02/06/2007  Job:  [id number]

## 2010-10-22 NOTE — H&P (Signed)
NAMEADANNA, [name]            ACCOUNT NO.:  [id number]   MEDICAL RECORD NO.:  [id number]          PATIENT TYPE:  INP   LOCATION:                               FACILITY:  [facility]   PHYSICIAN:  Ollen Gross, M.D.    DATE OF BIRTH:  1951-04-03   DATE OF ADMISSION:  06/19/2008  DATE OF DISCHARGE:                              HISTORY & PHYSICAL   d   CHIEF COMPLAINT:  Painful range of motion left knee.   HISTORY OF PRESENT ILLNESS:  The patient is a 60 year old female with  significant history of rheumatoid arthritis and chronic prednisone use.  The patient has been having progressively worsening pain of her left  knee.  The patient has failed conservative treatment in her knee.  She  has not been checked since she had an arthroscopy.  Arthroscopy reveals  that she has significant arthritic changes with bone-on-bone lateral  compartment of the patellofemoral area.  Patient has elected to proceed  with a total knee arthroplasty by Dr. Lequita Halt on June 19, 2008.   ALLERGIES:  PENICILLIN.   PATIENT IS INTOLERANT TO MORPHINE THAT CAUSES A SIGNIFICANT BURNING  SENSATION.   PRIMARY CARE PHYSICIAN:  Dr. Chase Picket.   CURRENT MEDICATIONS:  1. Prednisone 5 mg a day.  2. Vicodin 7.5 mg every 4 hours as needed.  3. Valium 5 mg every 8 hours p.r.n.  4. Prevacid 30 mg a day.  5. Lyrica 75 mg 2 tablets twice a day.  6. Nortriptyline 25 mg 1-3 tablets at bedtime.  7. Flexeril 10 mg at night.  8. Fosamax 70 mg once a weekend.  9. Fentanyl patch 75 mcg changed every 72 hours secondary to      fibromyalgia.   PAST MEDICAL HISTORY:  1. Severe rheumatoid arthritis on chronic prednisone use.  2. Cardiac arrhythmia occasional PVCs.  3. Reflux disease.  4. Urinary incontinence.  5. Osteoporosis.  6. Chronic fatigue syndrome.  7. Fibromyalgia.  8. History of memory impairment.  9. History impaired vision.  10.History of asthma.  11.History of irritable bowel syndrome.   REVIEW OF  SYSTEMS:  GENERAL:  She does have issues occasionally with  memory issues and occasional fatigue.  HEENT: She occasionally has issues related to blurred vision, some  dizziness, but has not had any problems with dizziness recently.  She  does have general weakness due to her fibromyalgia with balance  problems.  CARDIOVASCULAR:  She occasionally has some PVCs.  Her last stress test  was about 2 years previous.  GI:  She does have occasional problems of harping with some difficulty  swallowing.  GU:  She does have some incontinence at night.  MUSCULOSKELETAL:  She does have joint pain and swelling, stiffness and  typical rheumatoid deformities.   The patient does have a left buttocks battery pack for a bladder  stimulator which is implanted.   PAST SURGICAL HISTORY:  1. Left and right knee arthroscopies.  2. Right foot and ankle.  3. Right rotator cuff, left rotator cuff.  4. Right wrist fusion.  5. Left thumb surgery.  6. Right wrist carpal  tunnel.  7. Hysterectomy.  8. Tubal ligation.  9. Appendectomy.  10.Tonsillectomy without any complications with anesthesia.   FAMILY MEDICAL HISTORY:  Father deceased from congestive heart failure  at the age of 19.  Mother is deceased from complications of congestive  heart failure at the age of 65.  Grandmother had mouth cancer and died  the age of 69.   SOCIAL HISTORY:  The patient is married.  She is totally disabled.  She  currently smokes about two cigarettes a day.  She has occasional social  alcoholic beverage.  She has one child.  She lives in a full single  family home, two-story.  She will be returning home for convalescence  with family.   PHYSICAL EXAMINATION:  VITAL SIGNS:  She is 5 feet 2 inches, 175 pounds,  blood pressure was 118/68, pulse of 70 and regular, respirations 12,  patient is afebrile.  GENERAL:  This is short-statured female, ambulates with a cane in her  left hand.  She has obvious upper extremity  rheumatoid changes about  bilateral hand.  She has a very classic moon face, puffiness, secondary  to prednisone.  She walks with a significant left limb.  HEENT: Pupils were equal, she had good hearing.  She had a very typical  moon-faced type puffiness about the soft tissue.  NECK:  She was able to touch her chin to chest, look vertical towards  ceiling about 30 degrees above horizontal.  She is able to look over her  right and left neck and shoulder without any discomfort.  She has no  palpable lymphadenopathy.  CHEST:  Lung sounds were clear and equal.  HEART:  Regular rate and rhythm.  ABDOMEN:  Soft, nontender.  UPPER EXTREMITIES:  She had good range of motion of her right and left  shoulder, right and left elbow, her right wrist was fused.  She had good  motion of the left wrist.  She had very classic rheumatoid deformities  about the hands.  Both hips, she had full extension.  She was able to  flex them up to 120 degrees with 30 degrees internal-external rotation  without any discomfort.  The left knee, she lacked about 5 degrees  extension.  She was only able to flex it back to about 95 degrees.  She  did have a brace on it at this time.  She had no erythema or effusion  about the knee.  No gross instability.  Right knee, she had full  extension.  He was able to flex it back to 120 degrees.  He had no signs  of infection.  No effusion.  No instability.  Both calves were soft.  She had painless motion of both ankles.  PERIPHERAL VASCULAR:  Carotid pulses were 2+, no bruits.  Radial pulses  were 2+.  Dorsalis pedis pulses were 1+.  She had no lower extremity  edema or venous stasis changes.  NEUROLOGICAL:  The patient was conscious, alert and appropriate.  She  had no gross neurologic defects noted.  BREAST, RECTAL, GU:  Exams were deferred at this time.   IMPRESSION:  1. Rheumatoid arthritis on chronic prednisone use.  2. Severe left knee arthritic changes with painful range  of motion.  3. Reflux disease.  4. Cardiac arrhythmia with frequent PVCs.  5. Urinary incontinence.  6. Osteoporosis.  7. Fibromyalgia.  8. Chronic fatigue syndrome.  9. History of asthma.  10.History of memory impairment visual impairment.  11.History of vertigo and migraines.  12.History of irritable bowel syndrome.   PLAN:  Patient is scheduled to have a left total knee arthroplasty by  Dr. Lequita Halt at [facility] on June 19, 2008.  Did request  that anesthesia evaluate this patient for the need of possible x-rays of  her cervical spine for stability issues due to her severe and chronic  rheumatoid disease.  The patient will undergo all of the routine labs  and tests prior to having a left total knee arthroplasty by Dr. Lequita Halt.      Jamelle Rushing, P.A.      Ollen Gross, M.D.  Electronically Signed    RWK/MEDQ  D:  05/19/2008  T:  05/19/2008  Job:  [id number]   cc:   Ollen Gross, M.D.  Fax: [phone]

## 2010-10-22 NOTE — Discharge Summary (Signed)
[name], [name]            ACCOUNT NO.:  [id number]   MEDICAL RECORD NO.:  [id number]          PATIENT TYPE:  INP   LOCATION:  1614                         FACILITY:  [facility]   PHYSICIAN:  Ollen Gross, M.D.    DATE OF BIRTH:  [birth year]   DATE OF ADMISSION:  06/19/2008  DATE OF DISCHARGE:  06/22/2008                               DISCHARGE SUMMARY   ADMITTING DIAGNOSES:  1. Left knee arthritis.  2. Rheumatoid arthritis.  3. Reflux disease.  4. Cardiac arrhythmia with frequent premature ventricular      contractions.  5. Urinary incontinence.  6. Osteoporosis.  7. Fibromyalgia.  8. Chronic fatigue syndrome.  9. History of asthma.  10.History of memory impairment.  11.Visual impairment.  12.History of vertigo.  13.Migraines.  14.History of irritable bowel syndrome.   DISCHARGE DIAGNOSES:  1. Rheumatoid arthritis, left knee, status post left total knee      replacement arthroplasty.  2. Mild postop blood loss anemia; did not require transfusion.      Remaining discharge same as many  3. Reflux disease.  4. Cardiac arrhythmia with frequent premature ventricular      contractions.  5. Urinary incontinence.  6. Osteoporosis.  7. Fibromyalgia.  8. Chronic fatigue syndrome.  9. History of asthma.  10.History of memory impairment.  11.Visual impairment.  12.History of vertigo.  13.Migraines.  14.History of irritable bowel syndrome.   PROCEDURE:  June 19, 2008, left total knee.  Surgeon Dr. Lequita Halt.  Assistant Avel Peace PA-C.  Anesthesia general.  Postop Marcaine pain  pump.   CONSULTATIONS:  None.   BRIEF HISTORY:  [name] with severe  patellofemoral arthritis, left knee, with also rheumatoid arthritis,  extensive nonoperative management, still has intractable pain.  Now  presents for total knee arthroplasty.   LABORATORY DATA:  Preop CBC showed hemoglobin of 13.6, hematocrit 40.1,  white cell count 4.3, platelets 196,000.   Postop hemoglobin 10.5 and  9.6.  Came back up to last H&H of 10.5 and 30.3.  PT/PTT preop 12.5 and  24  respectively.  INR 0.9.  Serial protimes followed per hip protocol.  Last PT/INR 31.6and 2.8.  Chem panel on admission all within normal  limits with exception of minimally low ALP of 36.  Remaining serial  BMETs were followed.  Electrolytes remained within normal limits.  Preop  UA negative.  Blood group type A+.   EKG June 14, 2008 showed sinus rhythm with frequent premature  ventricular complexes, left axis deviation, ST-T wave abnormality,  confirmed Dr. Eldridge Dace.   HOSPITAL COURSE:  The patient was admitted to the hospital, hospital  taken to the OR, underwent above said procedure without complication.  The patient tolerated the procedure well, later transferred to the  orthopedic floor on PCA.  Her pain control following surgery doing  pretty well.  On the morning of day #1, vitals looked good, was  diuresing fluids well.  The patient did receive steroid boost at the  time of surgery and prednisone dependent.  She was also placed on  prednisone taper.  Hemoglobin looked good.  Started getting out  of bed.  By day #2 she was doing well, tolerating meds.  Hemoglobin is down just  a little bit.  She was asymptomatic with this.  Dressing change.  Incision looked good.  She is up walking 80 to 150 feet and then  eventually by day #3 she was doing well, tolerating her meds.  Hemoglobin was back up to 10.5 and she was discharged home.   DISCHARGE/PLAN:  1. Was discharged home on June 22, 2008.  2. Discharge diagnoses, please see above.  3. Discharge meds are Percocet, Robaxin, Coumadin.  4. Diet as tolerated.   ACTIVITY:  Total knee protocol, weightbearing as tolerated.  Home health  PT, home health nursing.   FOLLOW UP:  Follow up in 2 weeks.   DISPOSITION:  Home.   CONDITION ON DISCHARGE:  Improving.      Alexzandrew L. Perkins, P.A.C.      Ollen Gross,  M.D.  Electronically Signed    ALP/MEDQ  D:  08/05/2008  T:  08/05/2008  Job:  [id number]   cc:   Areatha Keas, M.D.  Fax: [phone]

## 2010-10-22 NOTE — Op Note (Signed)
NAMENOHA, MILBERGER            ACCOUNT NO.:  [id number]   MEDICAL RECORD NO.:  [id number]          PATIENT TYPE:  INP   LOCATION:  0005                         FACILITY:  [facility]   PHYSICIAN:  Ollen Gross, M.D.    DATE OF BIRTH:  1950-11-29   DATE OF PROCEDURE:  06/19/2008  DATE OF DISCHARGE:                               OPERATIVE REPORT   PREOPERATIVE DIAGNOSIS:  Rheumatoid/osteoarthritis left knee.   POSTOPERATIVE DIAGNOSIS:  Rheumatoid/osteoarthritis left knee.   PROCEDURE:  Left total knee arthroplasty.   SURGEON:  Ollen Gross, M.D.   ASSISTANT:  Avel Peace, PA-C.   ANESTHESIA:  General with postop Marcaine pain pump.   ESTIMATED BLOOD LOSS:  Minimal.   DRAIN:  None.   TOURNIQUET TIME:  26 minutes at 300 mmHg.   COMPLICATIONS:  None.   CONDITION:  Stable to recovery.   BRIEF CLINICAL NOTE:  [name] is a 60 year old female with severe  patellofemoral arthritis of the left knee, who also has rheumatoid  arthritis.  She has had extensive nonoperative management and still has  intractable pain.  She presents now for total knee arthroplasty.   PROCEDURE IN DETAIL:  After the successful initiation of general  anesthetic, a tourniquet was placed high on the left thigh and the left  lower extremity prepped and draped in the usual sterile fashion.  The  extremity was wrapped in Esmarch, knee flexed and tourniquet inflated to  300 mmHg.  A midline incision was made with a 10 blade through  subcutaneous tissue to the level of the extensor mechanism.  A fresh  blade was used to make a medial parapatellar arthrotomy.  Soft tissue  over the proximal and medial tibia was subperiosteally elevated to the  joint line with the knife and into the semimembranosus bursa with a Cobb  elevator.  Soft tissue laterally was elevated with attention being paid  to avoid the patellar tendon on the tibial tubercle.  The patella was  subluxed laterally, knee flexed 90 degrees, ACL and PCL  removed.  A  drill was used create a starting hole in the distal femur.  The canal  was thoroughly irrigated.  A 5 degree left valgus alignment guide was  placed and referencing off the posterior condyle, rotation was marked  and a block pinned to remove 10 mm off the distal femur.  Distal femoral  resection was made with an oscillating saw.  A sizing block was placed  and a size 2.5 was the most appropriate.  Rotation was marked at the  epicondylar axis.  A size 2.5 cutting block was placed and the anterior,  posterior and chamfer cuts made.   The tibia subluxed forward and the menisci were removed.  Extramedullary  tibial alignment guide was placed referencing proximally at the medial  aspect of the tibial tubercle and distally along the second metatarsal  axis and tibial crest.  The block was pinned to remove 10 mm off the non-  deficient lateral side.  Tibial resection was made with an oscillating  saw.  A size 2.5 was most appropriate tibial component.  The proximal  tibia  was prepared with a modular drill and keel punch for the size 2.5.  Femoral preparation was completed with the intercondylar cut.   A size 2.5 mobile bearing tibial trial, a 2.5 posterior stabilized  femoral trial and a 10-mm posterior stabilized rotating platform insert  trial were placed.  With the 10, she had a little bit of anterior-  posterior play, so I went to 12.5, which allowed for full extension with  excellent varus, valgus, anterior and posterior balance throughout full  range of motion.  The patella was everted and thickness measured to be  20 mm.  Freehand resection was taken to 12 mm, 35 template was placed,  lug holes were drilled, trial patella was placed and it tracked  normally.  Osteophytes were removed off the posterior femur with the  trial in place.  All trials were removed and the cut bone surfaces  prepared with pulsatile lavage.  Cement was mixed and once ready for  implantation, a size  2.5 mobile bearing tibial tray, 2.5 posterior  stabilized femur and 35 patella were cemented into place.  The patella  was held with a clamp.  The trial 12.5 mm insert was placed, knee held  in full extension and all extruded cement removed.  When the cement had  fully hardened, then the permanent 12.5 mm posterior stabilized rotating  platform insert was placed into the tibial tray.  The wound was  copiously irrigated with saline solution and then FloSeal injected on  the posterior capsule, medial and lateral gutters and suprapatellar  area.  A moist sponge was placed and tourniquet released for a total  time of 26 minutes.  The sponge was held for 2 minutes and removed.  Minimal bleeding was encountered.  The bleeding that was encountered was  stopped with electrocautery.  The wound was again irrigated and the  arthrotomy closed with interrupted #1 PDS.  Flexion against gravity was  135 degrees.  The subcu was closed with interrupted 2-0 Vicryl and  subcuticular running 4-0 Monocryl.  The incision was then cleaned and  dried and Steri-Strips and a bulky sterile dressing applied.  She was  then placed into a knee immobilizer, awakened and transported to  recovery in stable condition.      Ollen Gross, M.D.  Electronically Signed     FA/MEDQ  D:  06/19/2008  T:  06/19/2008  Job:  [id number]

## 2010-10-25 NOTE — Op Note (Signed)
NAME:  [name], [name]            ACCOUNT NO.:  [id number]   MEDICAL RECORD NO.:  [id number]          PATIENT TYPE:  AMB   LOCATION:  NESC                         [facility]ACILITY:  [facility]   PHYSICIAN:  Ollen Gross, M.D.    DATE OF BIRTH:  1950-12-12   DATE OF PROCEDURE:  10/30/2006  DATE OF DISCHARGE:                               OPERATIVE REPORT   PREOPERATIVE DIAGNOSIS:  Left knee lateral meniscal tear.   POSTOPERATIVE DIAGNOSIS:  Left knee lateral meniscal tear.   PROCEDURE:  Left knee arthroscopy with lateral meniscal debridement.   SURGEON:  Dr. Homero Fellers Aluisio.   ASSISTANT:  No assistant.   ANESTHESIA:  General.   ESTIMATED BLOOD LOSS:  Minimal.   DRAIN:  None.   COMPLICATIONS:  None.   CONDITION:  Stable to recovery.   CLINICAL NOTE:  [name] is a 60 year old female with a several month  history of progressively increasing left knee pain and dysfunction.  Exam and history suggested a lateral meniscal tear.  She is unable to  have an MRI and based on clinical exam, we elected to proceed with  arthroscopic treatment.  She had a similar presentation with the right  knee a few years back and responded very well to arthroscopy.   DESCRIPTION OF PROCEDURE:  After successful administration of general  anesthetic, a tourniquet was placed on the left thigh and left lower  extremity prepped and draped in the usual sterile fashion.  A standard  superomedial inferolateral incision was made, inflow cannula passed,  superomedial camera passed inferolateral.  Arthroscopic visualization  proceeds.  The undersurface of the patella shows grade 4 change  laterally.  The lateral trochlea also has grade 4 change.  Centrally  there is some grade 2 and 3 change.  The medial and lateral gutters were  visualized, there were no loose bodies.  Flexion and valgus force was  applied to the knee and the medial compartment is entered.  The medial  compartment looks normal.  A spinal needle was used  to localize the  inferomedial portal, a small incision made, dilator placed.  We applied  valgus force in the posterior horn and the medial meniscus looks fine.  The intercondylar notch was visualized and the ACL looks normal.  The  lateral compartment was entered, she has exposed bone throughout a large  portion of the lateral tibial plateau.  The lateral femoral condyle does  not have any corresponding lesion.  There is a bad tear in the body and  posterior horn of the lateral meniscus.  The meniscus was debrided back  to a stable base with baskets and a 4.2-mm shaver.  The area of the  lateral tibial plateau that did not have exposed bone did have some  grade 3 chondromalacia with unstable looking cartilage and I debrided  back to stable cartilaginous base.  The joint was again visualized.  No  other loose bodies, torn cartilage or chondral defects.  The arthroscopic equipment was then removed from inferior portals which  are closed with interrupted 4-0 nylon.  20 mL of 0.25% Marcaine with epi  injected through the inflow  cannula and that is removed and that portal  closed with nylon.  A bulky sterile dressing is applied and she is  awakened and transported to recovery in stable condition.      Ollen Gross, M.D.  Electronically Signed     FA/MEDQ  D:  10/30/2006  T:  10/30/2006  Job:  [id number]

## 2010-10-25 NOTE — Assessment & Plan Note (Signed)
[facility]                         GASTROENTEROLOGY OFFICE NOTE   [name], [name]                   MRN:          [id number]  DATE:05/11/2006                            DOB:          [birth year]    PROBLEM:  Chest pain and dysphagia.   [name] has returned for a scheduled GI followup.  Upper endoscopy  on April 09, 2006 was entirely unremarkable.  She has been taking  Prevacid twice a day and reports no further GI symptoms.  Specifically,  she is without chest pain, dysphagia or pyrosis.  She remains on  prednisone 5 mg a day for her rheumatoid arthritis.   PHYSICAL EXAMINATION:  VITAL SIGNS:  Pulse 60, blood pressure 110/70,  weight 179.   IMPRESSION:  Chest pain - likely secondary to acid reflux.   RECOMMENDATIONS:  Continue PPI therapy, though I instructed her to  reduce her Prevacid to once a day.  She will increase her dose, should  her symptoms recur.     Barbette Hair. Arlyce Dice, MD,FACG  Electronically Signed    RDK/MedQ  DD: 05/11/2006  DT: 05/12/2006  Job #: [id number]   cc:   Areatha Keas, M.D.

## 2010-10-25 NOTE — Letter (Signed)
April 06, 2006    Areatha Keas, M.D.  [address]  [address]  [place], [place] [address]   RE:  [name], [name]  MRN:  [id number]  /  DOB:  1951/01/02   Dear Dr. Phylliss Bob:   Upon your kind referral, I had the pleasure of evaluating your patient and I  am pleased to offer my findings.  I saw [name] in the office  today.  Enclosed is a copy of my progress note that details my findings and  recommendations.   Thank you for the opportunity to participate in your patient's care.    Sincerely,      Barbette Hair. Arlyce Dice, MD,FACG    RDK/MedQ  DD: 04/06/2006  DT: 04/07/2006  Job #: [id number]

## 2010-10-25 NOTE — Assessment & Plan Note (Signed)
[facility]                           GASTROENTEROLOGY OFFICE NOTE   [name], [name]                   MRN:          [id number]  DATE:04/06/2006                            DOB:          Jan 13, 1951    REASON FOR CONSULTATION:  Chest pain.   [name] is a pleasant 60 year old white female referred through the  courtesy of Dr. Phylliss Bob for evaluation.  She has been suffering from  intermittent chest pain.  It is described as burning chest discomfort that  develops into frank pain.  She attributes this to her reflux.  She has  occasional dysphagia to liquids but not solids.  She takes Prevacid with  partial relief.  Symptoms have been worsening over the past 2 months.  She  takes prednisone 5 mg daily chronically.  She was recently started on Cipro  and Flagyl for acute left lower quadrant pain that was determined to be  diverticulitis by CT scan.  She tends to have loose stools.  There is no  history of melena or hematochezia.   PAST MEDICAL HISTORY:  Pertinent for rheumatoid arthritis.  She has  fibromyalgia as well.  She is status post hysterectomy and appendectomy and  what sounds like interstitial cystitis.   FAMILY HISTORY:  Pertinent for a father with colon polyps.  Both parents  have heart disease.   MEDICATIONS:  Include:  1. Prednisone 5 mg a day.  2. Prevacid 30 mg daily.  3. Nortriptyline.  4. Prozac.  5. Fosamax.  6. Xanax p.r.n.  7. Rituxan IV infusion.  8. Reglan before each meal and h.s.   She has no allergies.  She does not smoke, she drinks occasionally.  She is  married and works as a Librarian, academic.   REVIEW OF SYSTEMS:  Positive for feet swelling, joint pains, back pains, and  a recent sore throat.   PHYSICAL EXAMINATION:  VITAL SIGNS:  The pulse is 72, blood pressure 120/80,  weight 175.  HEENT:  EOMI. PERRLA. Sclerae are anicteric.  Conjunctivae are pink.  NECK:  Supple without thyromegaly, adenopathy or  carotid bruits.  CHEST:  Clear to auscultation and percussion without adventitious sounds.  CARDIAC:  Regular rhythm; normal S1 S2.  There are no murmurs, gallops or  rubs.  ABDOMEN:  Bowel sounds are normoactive.  Abdomen is soft, non-tender and non-  distended.  There are no abdominal masses, tenderness, splenic enlargement  or hepatomegaly.  EXTREMITIES:  Full range of motion.  No cyanosis, clubbing or edema.  RECTAL:  Deferred.   IMPRESSION:  1. Chest pain that likely is related to acid reflux.  She may have a      candida infection of her esophagus in view of her chronic steroid use,      though no infection is seen in her oropharynx.  It is unlikely that her      chest pain is related to cardiac pain.  2. Resolving acute diverticulitis.  3. History of intermittent dysphagia - questionable secondary to peptic      stricture.   RECOMMENDATION:  1. Complete 10-day course of antibiotics.  2. Increase Prevacid to twice a day, one-half hour before breakfast and      before dinner.  3. Upper endoscopy with dilatation as indicated.     Barbette Hair. Arlyce Dice, MD,FACG    RDK/MedQ  DD: 04/06/2006  DT: 04/07/2006  Job #: [id number]   cc:   Areatha Keas, M.D.

## 2010-10-25 NOTE — Op Note (Signed)
NAME:  [name], [name]                      ACCOUNT NO.:  [id number]   MEDICAL RECORD NO.:  [id number]                   PATIENT TYPE:  OIB   LOCATION:  2899                                 FACILITY:  [facility]   PHYSICIAN:  Leonides Grills, M.D.                  DATE OF BIRTH:  May 08, 1951   DATE OF PROCEDURE:  12/16/2003  DATE OF DISCHARGE:                                 OPERATIVE REPORT   PREOPERATIVE DIAGNOSES:  1. Right first and second tarsometatarsal joint fracture-dislocation.  2. Right lateral malleolus fracture.  3. Right hallux valgus.  4. Right intercuneiform fracture-dislocation.  5. Right first metatarsal fracture.   POSTOPERATIVE DIAGNOSES:  1. Right first and second tarsometatarsal joint fracture-dislocation.  2. Right lateral malleolus fracture.  3. Right hallux valgus.  4. Right intercuneiform fracture-dislocation.  5. Right first metatarsal fracture.   OPERATION:  1. Open reduction and internal fixation, right first and second     tarsometatarsal joint fracture-dislocations.  2. Open reduction and internal fixation, right lateral malleolus fracture.  3. Open reduction and internal fixation of right intercuneiform fracture-     dislocation.  4. Open reduction and internal fixation, right first metatarsal fracture.  5. Right modified McBride bunionectomy.  6. Stress x-rays, right foot.   ANESTHESIA:  General endotracheal tube with ankle block.   SURGEON:  Leonides Grills, M.D.   ASSISTANT:  Lianne Cure, P.A.   ESTIMATED BLOOD LOSS:  Minimal.   TOURNIQUET TIME:  Approximately an hour and a half.   COMPLICATIONS:  None.   DISPOSITION:  Stable to PAR.   INDICATIONS:  This is a 60 year old female who sustained the above injury.  She also has been complaining of medial right great toe pain that was  interfering with her life prior to her injury.  She wished to have this  fixed as well as her fractures and fracture-dislocations fixed.  She was  consented  for the above procedure.  All risks, which include infection,  nerve or vessel injury, nonunion, malunion, hardware rotation, hardware  failure, stiffness, arthritis, recurrence of deformity, were all explained,  questions were encouraged and answered.   OPERATION:  The patient was brought to the operating room and placed in the  supine position after adequate ankle block as well as general endotracheal  tube anesthesia was administered, as well as Ancef 1 g IV piggyback.  The  right lower extremity was then prepped and draped in the usual sterile  manner over a proximally-placed thigh tourniquet.  The limb was gravity-  exsanguinated and the tourniquet was elevated to 290 mmHg.  A longitudinal  incision over the lateral malleolus was then made, dissection was carried  down to bone.  The fracture site was then encountered.  A four-hole one-  third tubular plate was then applied.  The fracture was then anatomically  reduced with a Weber clamp.  Four 3.5 mm fully-threaded cortical screws were  placed  and held the fracture anatomically reduced.  X-rays were obtained in  AP, lateral, and mortise views and showed an anatomic reduction of the  lateral malleolus.  The wound was copiously irrigated with normal saline.  Hemostasis was obtained throughout this portion of the procedure.  The subcu  was closed with 3-0 Vicryl, the skin was closed with 4-0 nylon.  A  longitudinal incision was then made over the dorsal aspect of the foot.  Dissection was carried down through skin.  Hemostasis was obtained.  It was  just lateral to the EHL tendon the incision was made.  The superficial  peroneal nerve was identified and a formal superficial peroneal nerve  neurolysis was then performed.  Once this was fully neurolysed and both the  extensor hallucis longus and brevis were mobilized, the fracture site and  fracture-dislocation was encountered.  We first reduced the intercuneiform  fracture-dislocation  with the two-point reduction clamp and through  percutaneous incision, a 3.5 mm fully-threaded cortical set screw was then  placed.  This held the reduction anatomic as well.  We then anatomically  reduced the second tarsometatarsal with a two-point reduction clamp.  Once  this was anatomically reduced, again through a stab wound medially a 3.5 mm  fully-threaded cortical set screw was then placed.  This held the second  tarsometatarsal anatomically reduced, and this was verified under C-arm  guidance.  We then found that the base of the first metatarsal as well as  the first tarsometatarsal joint were in a varus position.  We then corrected  this in slight valgus to help with her bunion deformity and applied a six-  hole quarter-tubular stacked plate.  This was pre-bent for the adequate  flexion.  This was felt through the forefoot as well to be adequately  plantar flexed.  We placed five 2.7 mm fully-threaded cortical set screws  through the plate respectively, holding the first metatarsal fracture  anatomically reduced as well as anatomically holding the first  tarsometatarsal joint anatomically reduced as well.  This was verified under  C-arm guidance in the AP and lateral planes to be in excellent position.  We  obtained stress x-rays and showed that the third tarsometatarsal did not  move and did not require any further fixation.  We also found that the  fixation construct was solid and without any gross motion or evidence of any  abnormalities.  All fixation was in proper position as well.  We then made a  longitudinal incision over the great toe MTP joint.  Dissection was carried  down through skin.  Neurovascular structures were identified both superiorly  and inferiorly.  L-shaped capsulotomy was then made.  A simple bunionectomy  was then performed with a sagittal saw.  The lateral capsule was then released with a curved Beaver blade.  We then advanced the capsule both   superiorly and proximally once a portion of capsule had been removed with 2-  0 Vicryl suture.  This was done with the toe held in varus and supinated  position.  This had excellent repair of the hallux valgus deformity.  X-rays  were obtained and showed it was in excellent alignment as well.  The wounds  were copiously irrigated with normal saline, the tourniquet was deflated,  hemostasis was obtained.  The subcu was closed with 3-0 Vicryl, the skin was  closed with 4-0 nylon over all wounds.  A sterile dressing was applied.  A  modified Jones dressing was applied.  The  patient was stable to the PAR.                                               Leonides Grills, M.D.    PB/MEDQ  D:  12/16/2003  T:  12/18/2003  Job:  [id number]

## 2010-11-20 ENCOUNTER — Encounter: Payer: Medicare Other | Attending: Physical Medicine & Rehabilitation | Admitting: Physical Medicine & Rehabilitation

## 2010-11-20 ENCOUNTER — Ambulatory Visit (HOSPITAL_COMMUNITY)
Admission: RE | Admit: 2010-11-20 | Discharge: 2010-11-20 | Disposition: A | Discharge status: Home / Self Care | Payer: Medicare Other | Source: Ambulatory Visit | Attending: Physical Medicine & Rehabilitation | Admitting: Physical Medicine & Rehabilitation

## 2010-11-20 ENCOUNTER — Other Ambulatory Visit: Payer: Self-pay | Admitting: Physical Medicine & Rehabilitation

## 2010-11-20 DIAGNOSIS — IMO0001 Reserved for inherently not codable concepts without codable children: Secondary | ICD-10-CM | POA: Insufficient documentation

## 2010-11-20 DIAGNOSIS — M25519 Pain in unspecified shoulder: Secondary | ICD-10-CM | POA: Insufficient documentation

## 2010-11-20 DIAGNOSIS — F329 Major depressive disorder, single episode, unspecified: Secondary | ICD-10-CM

## 2010-11-20 DIAGNOSIS — R52 Pain, unspecified: Secondary | ICD-10-CM

## 2010-11-20 DIAGNOSIS — M7530 Calcific tendinitis of unspecified shoulder: Secondary | ICD-10-CM

## 2010-11-20 DIAGNOSIS — M069 Rheumatoid arthritis, unspecified: Secondary | ICD-10-CM

## 2010-11-20 DIAGNOSIS — Z96659 Presence of unspecified artificial knee joint: Secondary | ICD-10-CM | POA: Insufficient documentation

## 2010-11-20 DIAGNOSIS — F3289 Other specified depressive episodes: Secondary | ICD-10-CM | POA: Insufficient documentation

## 2010-11-20 DIAGNOSIS — M0500 Felty's syndrome, unspecified site: Secondary | ICD-10-CM | POA: Insufficient documentation

## 2010-11-21 NOTE — Group Therapy Note (Signed)
CHIEF COMPLAINT:  Diffuse pain.  HISTORY OF PRESENT ILLNESS:  [name] with long history dating back around 30 years of rheumatoid arthritis. She was diagnosed also with fibromyalgia syndrome by Dr. Tylene Knox. She has had chronic problems with pain throughout her body from head to toe.  She has had left knee replacement and needs right knee replacement soon.  She has had surgery on her feet as well as orthoscopic surgery on her shoulders in the past.  She has been on numerous medications over the years.  More recently she had been on opiate for pain control including fentanyl.  She had her fentanyl reduced over the last 6 months down to 25 mcg from the previous 75 mcg she had use before and states her pain is increased.  She uses nortriptyline as well as cyclobenzaprine at night with her Valium.  She is on Lyrica additionally.  She trials of DMARDs in the past.  Most recently she is was on Enbrel, now only on prednisone.  She has had problems with the right leg wound after a fall and seen Plastic Surgery for this due to fact wound is not healing.  The patient tells me she will see Dr. Dierdre Knox, in the near future for rheumatological management.  The patient rates her pain is 7/10, described as burning, stabbing, aching.  Pain interferes with general activities, relations with others, enjoyment of life on a moderate-to-severe level.  Pain in shoulders, elbows, hands, wrists, legs, knees, back, etc.  She states that she sleeps fairly well from 11 p.m. to 7 a.m. nightly.  Pain is worse with any type of activity.  She is really limited especially due to her recent fall and wound issues.  Her walking is generally at sore.  She has to walk with assistance.  She may driving.  She climbs steps with assistance only.  REVIEW OF SYSTEMS:  Notable for trouble walking, spasms, tingling, weakness, depression, anxiety, and weight gain.  She reports  swelling overall.  She does state that she has not stimulator.  Full 12-point review is in the written health and history section of the chart.  SOCIAL HISTORY:  The patient is married and living with her spouse, is with her today.  The patient still occasionally drinks alcohol.  FAMILY HISTORY:  Positive for high blood pressure.  ALLERGIES:  None except for PENICILLIN.  HOME MEDICATIONS: 1. Vitamin D 1000 units daily. 2. Calcium 100 mg twice a day. 3. Prednisone 5 mg 2 tablets daily. 4. Prilosec 20 mg twice a day. 5. Lyrica 200 mg 2 times a day. 6. Cymbalta 60 mg 2 pills once a day. 7. Fentanyl patch 25 mcg q.3 days. 8. Nortriptyline 75 mg daily. 9. Cyclobenzaprine 10 mg daily. 10.Diazepam 5 mg 4 times a day. 11.Oxycodone and hydrocodone as needed for breakthrough pain which was     given by her plastic surgeon most recently.  PAST MEDICAL HISTORY:  Positive for the above as well as depression, perhaps Felty syndrome, limited history is available to me in our referral packet.  She also has cholelithiasis.  Mild reflux as well. Vitamin D deficiency.  PHYSICAL EXAMINATION:  VITAL SIGNS:  Blood pressure is 122/69, pulse is 103, respiratory rate 18, and she is satting 97% on room air. GENERAL:  The patient is generally pleasant and alert.  She is slightly overweight. MUSCULOSKELETAL:  Legs have some edema but this is mild.  She has a wound over the shin, I  assume over the proximal third of the tibia as well as over the distal third.  The distal one was wrapped in gauze I did not open.  The top point had some drainage and has looked to be some fiber necrotic tissue superficially.  Rest of her skin was intact.  She had multiple scars from prior operations of the feet, left knee, and shoulders.  The patient is diffusely tender to palpation particularly in the upper extremities along the shoulder and into the tricep region bilaterally.  She had pain with any type of pinching  maneuvers.  Her right wrist is fused.  Strength, otherwise in the upper extremities is 3- 4/5 except for at the shoulders with abduction and rotational movements. Hip flexion, knee extensor grossly 3-4/5 and ankle dorsiflexion, plantar flexion is 3+-4/5.  She had no focal sensory loss that I could discern either leg.  Straight leg testing was is equivocally provocative.  She has poor posture overall and has a head forward shoulders curved internally position.  Trapezius muscle was tender to palpation distally and tight throughout.  She has multiple joint deformities including the left wrist, bilateral metacarpal phalangeal joints are also notable. She has some chronic sclerotic changes in the right knee as well. HEART:  Tachycardiac. CHEST:  Clear. ABDOMEN:  Soft, nontender. COGNITIVE:  The patient is alert, appropriate, and very pleasant today.  ASSESSMENT: 1. Rheumatoid arthritis. 2. Fibromyalgia syndrome. 3. Felty syndrome. 4. History of multiple surgeries including bilateral shoulders, left     knee replacement, bilateral feet, right wrist fusion related to     rheumatoid arthritis. 5. Depression.  PLAN: 1. To save everybody some confusion, I wrote her fentanyl patch and     increased to 50 mcg.  We will take over this medication here on. 2. Also help with her prednisone today but I would prefer that     Rheumatology follows her for direct rheumatological management.     She was given 5 mg 2 tablets daily, #60. 3. We will increase Lyrica to 200 mg t.i.d. for general fibromyalgia     symptoms. 4. The patient will follow up with Plastic Surgery regarding wound     care issues. 5. I would like to see if we can perhaps give sample pack some of her     medications particularly the Valium.  She is generally using that     in the morning on occasion as well as at nighttime.  May be able to     reduce this if we increase her nortriptyline or cyclobenzaprine. 6. Continue vitamin D  and calcium supplementation. 7. I will see the patient back here in about one months time.  We did     discuss shoulder problems today as well.  I will send her for x-     rays of both shoulders and then consider injections for pain as the     shoulders itself seems to     be the most tender area on exam today.  She may need further     followup with Orthopedic Surgery in that area also.     Ranelle Oyster, M.D. Electronically Signed    ZTS/MedQ D:  11/20/2010 14:46:07  T:  11/21/2010 01:54:35  Job #:  [id number]  cc:   Dr. Hamilton Capri  Thayer Headings, M.D. Fax: [phone]  Dr. Laurence Aly

## 2010-12-16 ENCOUNTER — Encounter: Payer: Medicare Other | Attending: Physical Medicine & Rehabilitation | Admitting: Physical Medicine & Rehabilitation

## 2010-12-16 DIAGNOSIS — F341 Dysthymic disorder: Secondary | ICD-10-CM | POA: Insufficient documentation

## 2010-12-16 DIAGNOSIS — M7530 Calcific tendinitis of unspecified shoulder: Secondary | ICD-10-CM

## 2010-12-16 DIAGNOSIS — M19019 Primary osteoarthritis, unspecified shoulder: Secondary | ICD-10-CM | POA: Insufficient documentation

## 2010-12-16 DIAGNOSIS — IMO0001 Reserved for inherently not codable concepts without codable children: Secondary | ICD-10-CM | POA: Insufficient documentation

## 2010-12-16 DIAGNOSIS — M069 Rheumatoid arthritis, unspecified: Secondary | ICD-10-CM | POA: Insufficient documentation

## 2010-12-16 NOTE — Assessment & Plan Note (Signed)
HISTORY:  [name] is back regarding her multiple pain complaints related to her rheumatoid arthritis and fibromyalgia syndrome.  She had her split-thickness skin graft of the right lower leg done on June 25 and states this healing nicely.  She was seen Dr. Dierdre Forth as well in followup.  She felt that the fentanyl patch was helpful for her overall pain as well as Lyrica, but at times due to problems with skin and wound care, she finds a hard to differentiate whether she is improved or not. She reports some pain in the right shoulder still as well as right hamstring more than left hamstring.  She has not been moving around as much with the recent surgery.  The x-ray both of her shoulders, the left shoulder was fairly unremarkable given her history.  The right shoulder was notable for marked narrowing of the subacromial space with likely impinged upon the rotator cuff.  REVIEW OF SYSTEMS:  Notable for the above.  She does report some depression, anxiety, easy bleeding.  Full 12-point review is in the written health and history section of the chart.  SOCIAL HISTORY:  Unchanged.  She is married and husband is with her today.  PHYSICAL EXAMINATION:  VITAL SIGNS:  Blood pressure 154/70, pulse 115, respiratory rate 18, and she is satting 97% on room air. GENERAL:  The patient is pleasant, alert, oriented x3.  Affect showing bright and appropriate. MUSCULOSKELETAL:  She favors the lower right leg with ambulation and walks with a shuffling gait.  She has some pain in both hamstring compartments with standing today.  There was pain also along the posterior pelvis.  Right leg was wrapped and I did not remove the dressing.  Right shoulder has noticeable pain with abduction and she does have tight the subacromial space.  ASSESSMENT: 1. Rheumatoid arthritis. 2. Fibromyalgia syndrome. 3. History of rotator cuff injury bilaterally, status post rotator     cuff repair by reports bilaterally.  She  has significant     degeneration at the right shoulder with likely compromise somewhat     of the rotator cuff tendon. 4. Depression.  PLAN: 1. I would like for the wound in the right leg to heal further.  If     she does see further improvement there, we can pursue physical     therapy to work on lower extremity strength and range of motion,     low back and core muscle strengthening as well as stabilization of     the scapula, rotator cuff/scapula stabilizing muscles. 2. After informed consent, I injected via lateral approach 40 mg     Kenalog and 3 mL of 1% lidocaine.  The patient tolerated well     without any ill effects today. 3. Consider further imaging of the right shoulder.  She cannot have an     MRI due to her stimulator placement.  Consider Orthopedic followup     also. 4. Pursue aquatic therapy at some point down the line as well. 5. I will see her back here in about 1 months' time.  Answered all     questions today.  The patient will call me back with any further     issues.     Ranelle Oyster, M.D. Electronically Signed    ZTS/MedQ D:  12/16/2010 11:46:05  T:  12/16/2010 18:45:31  Job #:  [id number]  cc:   Thayer Headings, M.D. Fax: [phone]  Alben Deeds, MD Fax: [phone]  Dr. Hamilton Capri

## 2011-01-04 ENCOUNTER — Emergency Department (HOSPITAL_COMMUNITY)
Admission: EM | Admit: 2011-01-04 | Discharge: 2011-01-05 | Disposition: A | Discharge status: Home / Self Care | Payer: Medicare Other | Attending: Emergency Medicine | Admitting: Emergency Medicine

## 2011-01-04 ENCOUNTER — Emergency Department (HOSPITAL_COMMUNITY): Payer: Medicare Other

## 2011-01-04 DIAGNOSIS — M069 Rheumatoid arthritis, unspecified: Secondary | ICD-10-CM | POA: Insufficient documentation

## 2011-01-04 DIAGNOSIS — K5732 Diverticulitis of large intestine without perforation or abscess without bleeding: Secondary | ICD-10-CM | POA: Insufficient documentation

## 2011-01-04 DIAGNOSIS — R509 Fever, unspecified: Secondary | ICD-10-CM | POA: Insufficient documentation

## 2011-01-04 DIAGNOSIS — R222 Localized swelling, mass and lump, trunk: Secondary | ICD-10-CM | POA: Insufficient documentation

## 2011-01-04 DIAGNOSIS — Z79899 Other long term (current) drug therapy: Secondary | ICD-10-CM | POA: Insufficient documentation

## 2011-01-04 DIAGNOSIS — R000 Tachycardia, unspecified: Secondary | ICD-10-CM | POA: Insufficient documentation

## 2011-01-04 DIAGNOSIS — IMO0001 Reserved for inherently not codable concepts without codable children: Secondary | ICD-10-CM | POA: Insufficient documentation

## 2011-01-04 LAB — CBC
Platelets: 157 10*3/uL (ref 150–400)
RBC: 3.7 MIL/uL — ABNORMAL LOW (ref 3.87–5.11)
WBC: 6.9 10*3/uL (ref 4.0–10.5)

## 2011-01-04 LAB — URINALYSIS, ROUTINE W REFLEX MICROSCOPIC
Hgb urine dipstick: NEGATIVE
Nitrite: NEGATIVE
Protein, ur: NEGATIVE mg/dL
Urobilinogen, UA: 0.2 mg/dL (ref 0.0–1.0)

## 2011-01-04 LAB — COMPREHENSIVE METABOLIC PANEL
AST: 11 U/L (ref 0–37)
BUN: 5 mg/dL — ABNORMAL LOW (ref 6–23)
CO2: 28 mEq/L (ref 19–32)
Calcium: 8.1 mg/dL — ABNORMAL LOW (ref 8.4–10.5)
Chloride: 99 mEq/L (ref 96–112)
Creatinine, Ser: 0.55 mg/dL (ref 0.50–1.10)
GFR calc Af Amer: >60 mL/min (ref >60)
GFR calc non Af Amer: >60 mL/min (ref >60)
Glucose, Bld: 116 mg/dL — ABNORMAL HIGH (ref 70–99)
Total Bilirubin: 0.3 mg/dL (ref 0.3–1.2)

## 2011-01-04 MED ORDER — IOHEXOL 300 MG/ML  SOLN
100.0000 mL | Freq: Once | INTRAMUSCULAR | Status: AC | PRN
  Administered 2011-01-04: 100 mL via INTRAVENOUS

## 2011-01-05 LAB — DIFFERENTIAL
Band Neutrophils: 0 % (ref 0–10)
Basophils Absolute: 0 10*3/uL (ref 0.0–0.1)
Basophils Relative: 0 % (ref 0–1)
Blasts: 0 %
Myelocytes: 0 %
Neutro Abs: 0.1 10*3/uL — ABNORMAL LOW (ref 1.7–7.7)
Neutrophils Relative %: 2 % — ABNORMAL LOW (ref 43–77)
Promyelocytes Absolute: 0 %

## 2011-01-05 LAB — URINE CULTURE: Culture  Setup Time: 201207282004

## 2011-01-06 LAB — CLOSTRIDIUM DIFFICILE BY PCR: Toxigenic C. Difficile by PCR: POSITIVE — AB

## 2011-01-06 LAB — OVA AND PARASITE EXAMINATION

## 2011-01-08 LAB — STOOL CULTURE

## 2011-01-09 ENCOUNTER — Other Ambulatory Visit (HOSPITAL_COMMUNITY): Payer: Self-pay | Admitting: Internal Medicine

## 2011-01-09 DIAGNOSIS — R918 Other nonspecific abnormal finding of lung field: Secondary | ICD-10-CM

## 2011-01-11 LAB — CULTURE, BLOOD (ROUTINE X 2)
Culture  Setup Time: 201207290119
Culture: NO GROWTH

## 2011-01-13 ENCOUNTER — Ambulatory Visit (HOSPITAL_COMMUNITY)
Admission: RE | Admit: 2011-01-13 | Discharge: 2011-01-13 | Disposition: A | Discharge status: Home / Self Care | Payer: Medicare Other | Source: Ambulatory Visit | Attending: Internal Medicine | Admitting: Internal Medicine

## 2011-01-13 DIAGNOSIS — K7689 Other specified diseases of liver: Secondary | ICD-10-CM | POA: Insufficient documentation

## 2011-01-13 DIAGNOSIS — J984 Other disorders of lung: Secondary | ICD-10-CM | POA: Insufficient documentation

## 2011-01-13 DIAGNOSIS — R918 Other nonspecific abnormal finding of lung field: Secondary | ICD-10-CM

## 2011-01-13 MED ORDER — IOHEXOL 300 MG/ML  SOLN: 95.0000 mL | Freq: Once | INTRAMUSCULAR | Status: AC | PRN

## 2011-01-13 MED ORDER — IOHEXOL 300 MG/ML  SOLN: 100.0000 mL | Freq: Once | INTRAMUSCULAR | Status: DC | PRN

## 2011-01-16 ENCOUNTER — Ambulatory Visit: Payer: Medicare Other

## 2011-01-16 ENCOUNTER — Ambulatory Visit (INDEPENDENT_AMBULATORY_CARE_PROVIDER_SITE_OTHER): Payer: Medicare Other | Admitting: Internal Medicine

## 2011-01-16 ENCOUNTER — Encounter: Payer: Self-pay | Admitting: Internal Medicine

## 2011-01-16 VITALS — BP 124/66 | HR 101 | Temp 98.2°F | Ht 62.5 in | Wt 178.0 lb

## 2011-01-16 DIAGNOSIS — R918 Other nonspecific abnormal finding of lung field: Secondary | ICD-10-CM

## 2011-01-16 DIAGNOSIS — R0989 Other specified symptoms and signs involving the circulatory and respiratory systems: Secondary | ICD-10-CM

## 2011-01-16 DIAGNOSIS — R0609 Other forms of dyspnea: Secondary | ICD-10-CM

## 2011-01-16 DIAGNOSIS — R222 Localized swelling, mass and lump, trunk: Secondary | ICD-10-CM

## 2011-01-16 DIAGNOSIS — R05 Cough: Secondary | ICD-10-CM | POA: Insufficient documentation

## 2011-01-16 DIAGNOSIS — R0602 Shortness of breath: Secondary | ICD-10-CM

## 2011-01-16 LAB — PROTIME-INR
INR: 1.1 ratio — ABNORMAL HIGH (ref 0.8–1.0)
Prothrombin Time: 12.7 s — ABNORMAL HIGH (ref 10.2–12.4)

## 2011-01-16 LAB — PULMONARY FUNCTION TEST

## 2011-01-16 LAB — APTT: aPTT: 26.9 s (ref 21.7–28.8)

## 2011-01-16 NOTE — Assessment & Plan Note (Signed)
Explained ddx of lung cancer (high prob), inflammation and Rheumatoid lung involvment (both low prob). Explained need for biopsy and methods of CTT guided TTNA, EBUS and regular flex bronch - all to be decided after PET. Explained risks of bleeding, ptx and sedation complications and non-diagnosis. She agrees to proceed.  PLAN Please have PET scan < 1 week My nurse will check if there is recent blood work for PT/PTT for biopsy procedure; if not we will repeat this Please have full PFT breathing test in < 1 week here or [place] I will call you with PET scan results to decide on appropriate biopsy procedure Call if having any problems

## 2011-01-16 NOTE — Progress Notes (Signed)
Subjective:    Patient ID: [name], female    DOB: 08-11-1950, 60 y.o.   MRN: [id number]  HPI  60 year old female.Ex-smoker. RA on chronic prednisone.  Presents with husband [name] who sees me for asthma. Had diverticular symptoms 2 weeks ago per hx. CT abg lung cut showed mass. This resulted in CT chest as outpatient 01/13/2011 which shows lung mass. So referred here. REviwe of CT shows 4.6cm irregular lung mass that abuts visceral pleura and is ain RML with associated lung collapse. There is also 2.3 subcarinal node. Otherwise negative. Personally reviewed film and these are new since CT chest Nov 2006 and CXR May 2011  In terms of symptoms reports 2 months of new insidious onset cough that is slowly progressive but still rates it as mild-moderate. Past 1 week small amounts of associated white sputum. Also, past 2 months associated feelings of fatigue +. No clear cut aggravating or relieving factors. No associated chest pain, fever, chills, weight loss  Recent labs 01/09/2011 - creat 0.6mg %, plat 152k  Past Medical History  Diagnosis Date  . Rheumatoid arthritis   . Diverticulosis   . Fibromyalgia      Family History  Problem Relation Age of Onset  . Clotting disorder Mother   . Allergies Mother      History   Social History  . Marital Status: Married    Spouse Name: N/A    Number of Children: N/A  . Years of Education: N/A   Occupational History  . disability    Social History Main Topics  . Smoking status: Former Smoker -- 0.2 packs/day for 40 years    Types: Cigarettes    Quit date: 03/09/2009  . Smokeless tobacco: Not on file  . Alcohol Use: Yes     social  . Drug Use: No  . Sexually Active: Not on file   Other Topics Concern  . Not on file   Social History Narrative  . No narrative on file     Allergies  Allergen Reactions  . Penicillins      No outpatient prescriptions prior to visit.      Review of Systems  Constitutional:  Negative for fever and unexpected weight change.  HENT: Negative for ear pain, nosebleeds, congestion, sore throat, rhinorrhea, sneezing, trouble swallowing, dental problem, postnasal drip and sinus pressure.   Eyes: Negative for redness and itching.  Respiratory: Positive for cough and shortness of breath. Negative for chest tightness and wheezing.   Cardiovascular: Negative for palpitations and leg swelling.  Gastrointestinal: Negative for nausea and vomiting.  Genitourinary: Negative for dysuria.  Musculoskeletal: Negative for joint swelling.  Skin: Negative for rash.  Neurological: Negative for headaches.  Hematological: Does not bruise/bleed easily.  Psychiatric/Behavioral: Negative for dysphoric mood. The patient is not nervous/anxious.        Objective:   Physical Exam  Vitals reviewed. Constitutional: She is oriented to person, place, and time. She appears well-developed and well-nourished. No distress.       Obese cushingoid  HENT:  Head: Normocephalic and atraumatic.  Right Ear: External ear normal.  Left Ear: External ear normal.  Mouth/Throat: Oropharynx is clear and moist. No oropharyngeal exudate.  Eyes: Conjunctivae and EOM are normal. Pupils are equal, round, and reactive to light. Right eye exhibits no discharge. Left eye exhibits no discharge. No scleral icterus.  Neck: Normal range of motion. Neck supple. No JVD present. No tracheal deviation present. No thyromegaly present.  Cardiovascular: Normal rate,  regular rhythm, normal heart sounds and intact distal pulses.  Exam reveals no gallop and no friction rub.   No murmur heard. Pulmonary/Chest: Effort normal and breath sounds normal. No respiratory distress. She has no wheezes. She has no rales. She exhibits no tenderness.  Abdominal: Soft. Bowel sounds are normal. She exhibits no distension and no mass. There is no tenderness. There is no rebound and no guarding.  Musculoskeletal: Normal range of motion. She  exhibits no edema and no tenderness.       RA joint deformities Left knee scar Ace wrap in left shin Rt knee OA  Lymphadenopathy:    She has no cervical adenopathy.  Neurological: She is alert and oriented to person, place, and time. She has normal reflexes. No cranial nerve deficit. She exhibits normal muscle tone. Coordination normal.  Skin: Skin is warm and dry. No rash noted. She is not diaphoretic. No erythema. No pallor.  Psychiatric: Her behavior is normal. Judgment and thought content normal.       Flat affect Anxious about pottential cancer diagnosis          Assessment & Plan:

## 2011-01-16 NOTE — Patient Instructions (Signed)
Please have PET scan < 1 week My nurse will check if there is recent blood work for PT/PTT for biopsy procedure; if not we will repeat this Please have full PFT breathing test in < 1 week here or [place] I will call you with PET scan results to decide on appropriate biopsy procedure Call if having any problems

## 2011-01-16 NOTE — Assessment & Plan Note (Signed)
Probably due to ;lung mass  Plan Per lung mass

## 2011-01-16 NOTE — Progress Notes (Signed)
PFT done today. 

## 2011-01-20 ENCOUNTER — Encounter: Payer: Medicare Other | Attending: Physical Medicine & Rehabilitation | Admitting: Physical Medicine & Rehabilitation

## 2011-01-21 ENCOUNTER — Emergency Department (HOSPITAL_COMMUNITY): Payer: Medicare Other

## 2011-01-21 ENCOUNTER — Inpatient Hospital Stay (HOSPITAL_COMMUNITY)
Admission: EM | Admit: 2011-01-21 | Discharge: 2011-01-24 | DRG: 166 | Disposition: A | Discharge status: Home / Self Care | Payer: Medicare Other | Attending: Internal Medicine | Admitting: Internal Medicine

## 2011-01-21 ENCOUNTER — Encounter (HOSPITAL_COMMUNITY): Payer: Self-pay

## 2011-01-21 DIAGNOSIS — Z8719 Personal history of other diseases of the digestive system: Secondary | ICD-10-CM

## 2011-01-21 DIAGNOSIS — Z880 Allergy status to penicillin: Secondary | ICD-10-CM

## 2011-01-21 DIAGNOSIS — IMO0001 Reserved for inherently not codable concepts without codable children: Secondary | ICD-10-CM | POA: Diagnosis present

## 2011-01-21 DIAGNOSIS — R918 Other nonspecific abnormal finding of lung field: Secondary | ICD-10-CM

## 2011-01-21 DIAGNOSIS — D6489 Other specified anemias: Secondary | ICD-10-CM | POA: Diagnosis present

## 2011-01-21 DIAGNOSIS — IMO0002 Reserved for concepts with insufficient information to code with codable children: Secondary | ICD-10-CM

## 2011-01-21 DIAGNOSIS — J189 Pneumonia, unspecified organism: Principal | ICD-10-CM | POA: Diagnosis present

## 2011-01-21 DIAGNOSIS — A0472 Enterocolitis due to Clostridium difficile, not specified as recurrent: Secondary | ICD-10-CM | POA: Diagnosis not present

## 2011-01-21 DIAGNOSIS — M810 Age-related osteoporosis without current pathological fracture: Secondary | ICD-10-CM | POA: Diagnosis present

## 2011-01-21 DIAGNOSIS — G894 Chronic pain syndrome: Secondary | ICD-10-CM | POA: Diagnosis present

## 2011-01-21 DIAGNOSIS — L97909 Non-pressure chronic ulcer of unspecified part of unspecified lower leg with unspecified severity: Secondary | ICD-10-CM | POA: Diagnosis present

## 2011-01-21 DIAGNOSIS — Z87891 Personal history of nicotine dependence: Secondary | ICD-10-CM

## 2011-01-21 DIAGNOSIS — K219 Gastro-esophageal reflux disease without esophagitis: Secondary | ICD-10-CM | POA: Diagnosis present

## 2011-01-21 DIAGNOSIS — J9600 Acute respiratory failure, unspecified whether with hypoxia or hypercapnia: Secondary | ICD-10-CM | POA: Diagnosis present

## 2011-01-21 DIAGNOSIS — M069 Rheumatoid arthritis, unspecified: Secondary | ICD-10-CM | POA: Diagnosis present

## 2011-01-21 DIAGNOSIS — R222 Localized swelling, mass and lump, trunk: Secondary | ICD-10-CM | POA: Diagnosis present

## 2011-01-21 LAB — COMPREHENSIVE METABOLIC PANEL
ALT: 12 U/L (ref 0–35)
AST: 14 U/L (ref 0–37)
Calcium: 8.8 mg/dL (ref 8.4–10.5)
Creatinine, Ser: 1.11 mg/dL — ABNORMAL HIGH (ref 0.50–1.10)
GFR calc Af Amer: >60 mL/min (ref >60)
Glucose, Bld: 129 mg/dL — ABNORMAL HIGH (ref 70–99)
Sodium: 135 mEq/L (ref 135–145)
Total Protein: 8.2 g/dL (ref 6.0–8.3)

## 2011-01-21 LAB — DIFFERENTIAL
Basophils Absolute: 0.1 10*3/uL (ref 0.0–0.1)
Eosinophils Absolute: 0 10*3/uL (ref 0.0–0.7)
Lymphocytes Relative: 75 % — ABNORMAL HIGH (ref 12–46)
Neutrophils Relative %: 2 % — ABNORMAL LOW (ref 43–77)

## 2011-01-21 LAB — URINALYSIS, ROUTINE W REFLEX MICROSCOPIC
Nitrite: NEGATIVE
Specific Gravity, Urine: 1.02 (ref 1.005–1.030)
Urobilinogen, UA: 0.2 mg/dL (ref 0.0–1.0)
pH: 6 (ref 5.0–8.0)

## 2011-01-21 LAB — CBC
Platelets: 201 10*3/uL (ref 150–400)
RBC: 4.11 MIL/uL (ref 3.87–5.11)
RDW: 16.5 % — ABNORMAL HIGH (ref 11.5–15.5)
WBC: 6.1 10*3/uL (ref 4.0–10.5)

## 2011-01-21 LAB — PROTIME-INR: Prothrombin Time: 15 seconds (ref 11.6–15.2)

## 2011-01-21 LAB — APTT: aPTT: 44 seconds — ABNORMAL HIGH (ref 24–37)

## 2011-01-21 MED ORDER — IOHEXOL 300 MG/ML  SOLN
100.0000 mL | Freq: Once | INTRAMUSCULAR | Status: AC | PRN
  Administered 2011-01-21: 100 mL via INTRAVENOUS

## 2011-01-22 ENCOUNTER — Telehealth: Payer: Self-pay | Admitting: Internal Medicine

## 2011-01-22 ENCOUNTER — Inpatient Hospital Stay (HOSPITAL_COMMUNITY): Payer: Medicare Other

## 2011-01-22 LAB — IRON AND TIBC
Iron: <10 ug/dL — ABNORMAL LOW (ref 42–135)
UIBC: 140 ug/dL

## 2011-01-22 LAB — CBC
Hemoglobin: 9.2 g/dL — ABNORMAL LOW (ref 12.0–15.0)
MCH: 26.7 pg (ref 26.0–34.0)
MCHC: 32.4 g/dL (ref 30.0–36.0)

## 2011-01-22 LAB — COMPREHENSIVE METABOLIC PANEL
ALT: 10 U/L (ref 0–35)
Alkaline Phosphatase: 50 U/L (ref 39–117)
CO2: 27 mEq/L (ref 19–32)
Calcium: 7.9 mg/dL — ABNORMAL LOW (ref 8.4–10.5)
GFR calc Af Amer: >60 mL/min (ref >60)
GFR calc non Af Amer: >60 mL/min (ref >60)
Glucose, Bld: 103 mg/dL — ABNORMAL HIGH (ref 70–99)
Sodium: 133 mEq/L — ABNORMAL LOW (ref 135–145)
Total Bilirubin: 0.6 mg/dL (ref 0.3–1.2)

## 2011-01-22 LAB — PATHOLOGIST SMEAR REVIEW

## 2011-01-22 LAB — TSH: TSH: 1.934 u[IU]/mL (ref 0.350–4.500)

## 2011-01-22 LAB — DIFFERENTIAL
Basophils Absolute: 0 10*3/uL (ref 0.0–0.1)
Eosinophils Relative: 0 % (ref 0–5)
Lymphs Abs: 2.5 10*3/uL (ref 0.7–4.0)
Monocytes Absolute: 0.5 10*3/uL (ref 0.1–1.0)

## 2011-01-22 LAB — PROTIME-INR: Prothrombin Time: 16.2 seconds — ABNORMAL HIGH (ref 11.6–15.2)

## 2011-01-22 MED ORDER — FLUDEOXYGLUCOSE F - 18 (FDG) INJECTION
14.2000 | Freq: Once | INTRAVENOUS | Status: AC | PRN
  Administered 2011-01-22: 14.2 via INTRAVENOUS

## 2011-01-22 NOTE — Telephone Encounter (Signed)
Please of PET scan results. [name], CMA

## 2011-01-22 NOTE — Telephone Encounter (Signed)
Patient is admitted as inpatient. Spoke to hospitlast Dr Kerry Hough. Recommended he call Pulmonary consultation

## 2011-01-23 ENCOUNTER — Other Ambulatory Visit: Payer: Self-pay | Admitting: Internal Medicine

## 2011-01-23 ENCOUNTER — Inpatient Hospital Stay (HOSPITAL_COMMUNITY): Payer: Medicare Other

## 2011-01-23 DIAGNOSIS — J189 Pneumonia, unspecified organism: Secondary | ICD-10-CM

## 2011-01-23 DIAGNOSIS — R509 Fever, unspecified: Secondary | ICD-10-CM

## 2011-01-23 DIAGNOSIS — R222 Localized swelling, mass and lump, trunk: Secondary | ICD-10-CM

## 2011-01-23 DIAGNOSIS — R05 Cough: Secondary | ICD-10-CM

## 2011-01-23 LAB — CBC
MCH: 26.5 pg (ref 26.0–34.0)
MCHC: 32.3 g/dL (ref 30.0–36.0)
Platelets: 132 10*3/uL — ABNORMAL LOW (ref 150–400)
RDW: 16.3 % — ABNORMAL HIGH (ref 11.5–15.5)

## 2011-01-23 LAB — GLUCOSE, CAPILLARY: Glucose-Capillary: 111 mg/dL — ABNORMAL HIGH (ref 70–99)

## 2011-01-23 LAB — BASIC METABOLIC PANEL
Calcium: 8.1 mg/dL — ABNORMAL LOW (ref 8.4–10.5)
Creatinine, Ser: 0.59 mg/dL (ref 0.50–1.10)
GFR calc non Af Amer: >60 mL/min (ref >60)
Sodium: 136 mEq/L (ref 135–145)

## 2011-01-23 LAB — CLOSTRIDIUM DIFFICILE BY PCR: Toxigenic C. Difficile by PCR: POSITIVE — AB

## 2011-01-24 LAB — BASIC METABOLIC PANEL
BUN: 6 mg/dL (ref 6–23)
CO2: 26 mEq/L (ref 19–32)
Calcium: 8.7 mg/dL (ref 8.4–10.5)
Chloride: 106 mEq/L (ref 96–112)
Creatinine, Ser: <0.47 mg/dL — ABNORMAL LOW (ref 0.50–1.10)
Glucose, Bld: 137 mg/dL — ABNORMAL HIGH (ref 70–99)
Potassium: 3.4 mEq/L — ABNORMAL LOW (ref 3.5–5.1)
Sodium: 139 mEq/L (ref 135–145)

## 2011-01-24 LAB — CBC
MCHC: 31.5 g/dL (ref 30.0–36.0)
MCV: 82.9 fL (ref 78.0–100.0)
Platelets: 121 10*3/uL — ABNORMAL LOW (ref 150–400)
RDW: 16.2 % — ABNORMAL HIGH (ref 11.5–15.5)
WBC: 1.6 10*3/uL — ABNORMAL LOW (ref 4.0–10.5)

## 2011-01-26 LAB — CULTURE, RESPIRATORY W GRAM STAIN

## 2011-01-26 NOTE — H&P (Signed)
NAME:  [name], ALARID            ACCOUNT NO.:  [id number]  MEDICAL RECORD NO.:  [id number]  LOCATION:  [facility]                         FACILITY:  [facility]  PHYSICIAN:  Kathlen Mody, MD       DATE OF BIRTH:  Dec 16, 1950  DATE OF ADMISSION:  01/21/2011 DATE OF DISCHARGE:                             HISTORY & PHYSICAL   PRIMARY CARE PHYSICIAN:  Lorelle Formosa, MD  CHIEF COMPLAINT:  Brought in by the patient's husband for cough, right- sided chest pain, right upper quadrant pain, and fever.  HISTORY OF PRESENT ILLNESS:  This is a 60 year old lady with history of rheumatoid arthritis, fibromyalgia, chronic lower extremity ulcer, depression.  She had a recent episode of diverticulitis, was treated with ciprofloxacin and Flagyl.  Had persistent right upper quadrant pain and right-sided chest pain, associated with cough and fever of 103.  The patient is a poor historian.  The patient's husband is not at bedside. Whatever history that was available was from the ER notes and very minimal history from the patient. The patient was recently found to have a right lung mass about a week ago and that she was scheduled for a PET scan the next Monday.  The patient did not have biopsy of the right lung mass yet.  Today, she complains of right upper quadrant pain, not associated with any nausea, vomiting, diarrhea, also points to her right lower ribs and complains of pain.  Has chronic cough since many months and a fever of 103.3.  Denies any shortness of breath.  Denies headaches, blurry vision.  In the ER, the patient had a CT abdomen and pelvis with contrast done which found to have a 5.9 and 4.8 mass-like opacity with central necrosis in the right middle lobe of the lung, suspicious for bronchogenic carcinoma.  The hospitalist service has been requested to admit the patient for possible postobstructive pneumonia, in the setting of right lung mass.  REVIEW OF SYSTEMS:  See HPI, otherwise  negative.  PAST MEDICAL HISTORY: 1. History of rheumatoid arthritis, on chronic steroids. 2. Fibromyalgia. 3. Anemia. 4. 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 5. GERD. 6. Osteoporosis. 7. Chronic pain syndrome. 8. History of diverticulosis and diverticulitis. 9. Depression.  PAST SURGICAL HISTORY:  History of multiple surgeries to the shoulders, has a history of hysterectomy, and appendectomy.  SOCIAL HISTORY:  Lives with her husband, chronic smoker, takes alcohol occasionally.  Denies any recreational drug use.  ALLERGIES:  Patient is allergic to PENICILLIN, gives her itching as per the E-chart, but the patient denies at this time of any allergies.  FAMILY HISTORY:  No significant.  PHYSICAL EXAMINATION:  VITAL SIGNS:  She is febrile with T-max of 103.3, blood pressure of 110/55, pulse of 108 per minute, respirations 20 per minute, saturating 94% on room air, 96% on 2 L of nasal cannula. GENERAL:  She is alert.  She is febrile.  She is comfortable, in no acute distress, but very restless. HEENT:  Pupils reacting to light.  No JVD. CARDIOVASCULAR:  S1 and S2 heard. RESPIRATORY:  Decreased air entry bilateral.  No wheezing or rhonchi. ABDOMEN:  Obese, soft, nontender.  Bowel sounds are heard. EXTREMITIES:  Chronic  lower extremity ulcer, with wrapped in a stocking.  PERTINENT LABORATORY DATA:  On admission, the patient had a CBC significant for hemoglobin of 10.6, WBC count was normal, platelets were normal.  INR of 1.16.  Comprehensive metabolic panel significant for creatinine of 1.19, glucose of 129.  Urinalysis negative for nitrites and leukocytes.  RADIOLOGICAL DATA:  The patient had a chest x-ray which showed mass within the right lobe as does the bowel, may represent a primary carcinoma versus inflammatory process.  Had a CT abdomen and pelvis with contrast today showed no evidence of bowel obstruction, colonic diverticulosis; no inflammatory changes, but  has a 5.9 and a 4.8 cm mass- like opacity with central necrosis and surrounding inflammatory changes in the right middle lobe; neoplasm not excluded.  The patient had a CT chest with contrast on January 13, 2011, which shows right middle lobe mass approximately 4.6 cm in diameter.  ASSESSMENT AND PLAN:  A 60 year old lady admitted for right upper quadrant and right chest pain, associated with chronic cough and fever, was found to have right lung mass, suspicious for bronchogenic carcinoma.  We will treat the patient for postobstructive pneumonia.  We will treat her with broad-spectrum antibiotics, IV Zosyn, and IV vancomycin even though patient is allergic to penicillin.  She was given Zosyn in ER at 7 p.m. today.  She did not have any allergic reaction to that.  Probably, scheduled for a PET scan while she is in the hospital. We will need an oncology consult in the morning.  Keep her nasal oxygen as needed and albuterol nebulizers if needed.  Gastroesophageal reflux disease.  Continue with Protonix 40 mg daily.  For deep vein thrombosis prophylaxis, subcutaneous Lovenox.          ______________________________ Kathlen Mody, MD     VA/MEDQ  D:  01/21/2011  T:  01/21/2011  Job:  [id number]  Electronically Signed by Kathlen Mody MD on 01/26/2011 07:50:26 AM

## 2011-01-27 ENCOUNTER — Telehealth: Payer: Self-pay | Admitting: Internal Medicine

## 2011-01-27 ENCOUNTER — Other Ambulatory Visit (HOSPITAL_COMMUNITY): Payer: Medicare Other

## 2011-01-27 NOTE — Telephone Encounter (Signed)
Spoke with pt. She states that that Dr Sherene Sires called her today and gave her results over the phone. She states that now all she needs is to sched rov with MR per Dr Thurston Hole recs. I have scheduled her for his first available- 02/20/11 at 3:45 and advised her to call sooner of needed. Pt okay with this plan and states nothing further needed.

## 2011-01-28 LAB — CULTURE, BLOOD (ROUTINE X 2): Culture  Setup Time: 201208150533

## 2011-02-01 NOTE — Consult Note (Signed)
[name], PROVINCE            ACCOUNT NO.:  [id number]  MEDICAL RECORD NO.:  [id number]  LOCATION:  1438                         FACILITY:  [facility]  PHYSICIAN:  Charlaine Dalton. Sherene Sires, MD, FCCPDATE OF BIRTH:  [birth year]  DATE OF CONSULTATION:  01/23/2011 DATE OF DISCHARGE:                                CONSULTATION   REASON FOR CONSULTATION:  Febrile illness with right middle lobe density.  HISTORY:  This is a 60 year old white female who quit smoking 2 years ago who has a history of steroid-dependent rheumatoid arthritis with a CT scan of the abdomen done for the purposes of evaluating possible diverticulosis showing a right lower lobe density associated with a 2- week history of cough.  She abruptly became worse within 24 hours of admission with fever of 102 and right-sided pleuritic pain.  Her cough has previously been productive of white mucus and continues to be so with no purulence or hemoptysis.  However, inpatient study was completed which indicated a PET scan showing a densely active lesion in an area in the right middle lobe with the differential diagnosis being pneumonia versus neoplasm.  Because she has been on steroids with the possibility of opportunistic infection and because she has been a smoker with possibility of bronchogenic carcinoma, the issue was whether or not she should have bronchoscopy.  PAST MEDICAL HISTORY:  Significant for rheumatoid arthritis, diverticulosis, and fibromyalgia.  SOCIAL HISTORY:  She quit smoking 2 years ago.  FAMILY HISTORY:  Positive for clotting disorders in her mother and allergies in her mother.  MEDICATIONS:  Prior to admission, this patient was maintained on prednisone as noted above with no other pulmonary medications.  ALLERGIES:  The patient is reported to be allergic to PENICILLIN, although the patient denies this.  PHYSICAL EXAMINATION:  GENERAL:  This is a pleasant, elderly white female, in no acute distress with  stable vital signs.  She looks older than stated age. HEENT:  Unremarkable.  Oropharynx is clear. NECK:  Supple without cervical adenopathy or tenderness.  Her trachea is midline. LUNGS:  Lungs fields reveal minimal rhonchi bilaterally.  Overall air movement is adequate. HEART:  There is regular rate and rhythm without murmur, gallop, or rub. ABDOMEN:  Soft and benign with no palpable organomegaly, masses or tenderness. EXTREMITIES:  Without calf tenderness, cyanosis, clubbing, or edema.  IMAGING STUDIES:  Chest x-ray shows a dense right middle lobe infiltrate which corresponds to the intensely positive PET scan done on August 15th with no evidence of nodal activity.  No metastatic diseases noted for that matter and evidence of active diverticular inflammation.  IMPRESSION:  Incidentally noted "lung mass" now with a febrile illness, but no history of any hemoptysis or purulent sputum.  It is not clear to me whether the acute illness  that resulted in hospitalization has anything to do with the mass, but since the differential diagnosis includes opportunistic infection in a steroid-dependent patient and bronchogenic carcinoma in this former smoker, I think it is reasonable to try to obtain a tissue diagnosis rather than just treat her empirically with antibiotics (this does not really fit the pattern of a pneumonia, although the right middle lobe syndrome does come to mind as  possibility for chronic infection masquerading her cancer).  I discussed the risks, benefits, and alternatives in detail with both the patient and her husband, and they agreed to proceed as soon as we can schedule this.     Charlaine Dalton. Sherene Sires, MD, FCCP     MBW/MEDQ  D:  01/23/2011  T:  01/24/2011  Job:  [id number]  Electronically Signed by Sandrea Hughs MD FCCP on 02/01/2011 08:58:33 AM

## 2011-02-01 NOTE — Op Note (Signed)
  NAMEMODESTY, RUDY            ACCOUNT NO.:  [id number]  MEDICAL RECORD NO.:  [id number]  LOCATION:  1438                         FACILITY:  [facility]  PHYSICIAN:  Charlaine Dalton. Sherene Sires, MD, FCCPDATE OF BIRTH:  1950-08-06  DATE OF PROCEDURE: 01/23/11 DATE OF DISCHARGE:                              OPERATIVE REPORT   PROCEDURE: Video Fiberoptic bronchoscopy with transbronchial biopsy of the right middle lobe.  HISTORY AND INDICATIONS:  This is a 60 year old white female, a former smoker, who agreed to bronchoscopy after a full discussion of the risks, benefits, and alternatives.  She was seen on the morning of the evaluation, and a full H and P was dictated.  A formal time-out procedure was carried out prior to the procedure.  She was premedicated with a total of 5 mg of IV Versed and 50 mg of IV Demerol, but required an additional 5 mg of IV Versed and 50 grams of IV Demerol during the procedure to suppress excessive coughing.  The procedure was performed in the bronchoscopy suite with continuous monitoring by a surface ECG and oximetry.  Using a standard flexible video fiber bronchoscope, the right naris was easily cannulated with good visualization of the oropharynx and larynx. The cords moved normally.  There were no apparent upper airway lesions.  Using additional 1% lidocaine as needed, the entire tracheobronchial tree was explored bilaterally with the following findings: 1. Trachea and left-sided airways were normal. 2. The right upper lobe was normal. 3. There was heaped up tissue diffusely throughout the right middle     lobe and right lower lobe basal segments in a redundant fashion,     but nothing that suggested neoplasm.  On inspiration, the airways     appeared opened widely except for the right middle lobe which was     slit-like in appearance.  DESCRIPTION OF PROCEDURE: 1. Initially, the right middle lobe was lavaged.  However, I could not     obtain a wedge  position for this procedure. 2. The right middle lobe was biopsied x4.  I could not obtain a wedge     in this lobe either because of the slit-like anatomy of the     orifice.  However, the patient tolerated the procedure well, and we obtained adequate tissue within the density by fluoroscopic-guided transbronchial biopsy.  Followup chest x-ray shows no significant change or pneumothorax.  The patient tolerated the procedure well and will be returned to her room and diet resumed in 2 hours.  IMPRESSION:  Density, right middle lobe associated with febrile illness, unclear whether this represents pneumonia versus neoplasm in a patient who has rheumatoid arthritis but with also a history of steroid dependence(therefore immunocompromised) and also a smoker in the past.  RECOMMENDATIONS:  Await histology and special stains if indicated.     Charlaine Dalton. Sherene Sires, MD, FCCP     MBW/MEDQ  D:  01/23/2011  T:  01/24/2011  Job:  [id number]  Electronically Signed by Sandrea Hughs MD FCCP on 02/01/2011 08:59:45 AM

## 2011-02-04 NOTE — Discharge Summary (Signed)
[name], [name]            ACCOUNT NO.:  [id number]  MEDICAL RECORD NO.:  [id number]  LOCATION:  1438                         FACILITY:  [facility]  PHYSICIAN:  Erick Blinks, MD     DATE OF BIRTH:  March 20, 1951  DATE OF ADMISSION:  01/21/2011 DATE OF DISCHARGE:  01/24/2011                              DISCHARGE SUMMARY   PRIMARY CARE PHYSICIAN:  Dr. Ronne Binning  PULMONOLOGIST:  Kalman Shan, MD  DISCHARGE DIAGNOSES: 1. Right-sided pneumonia, possibly postobstructive, improving. 2. C difficile colitis. 3. Right middle lobe mass for outpatient pulmonary followup. 4. History of rheumatoid arthritis, on chronic prednisone. 5. Fibromyalgia. 6. Chronic anemia. 7. 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 8. Acute respiratory failure, resolved. 9. History of diverticulosis. 10.Chronic pain syndrome. 11.Gastroesophageal reflux disease. 12.Osteoporosis.  DISCHARGE MEDICATIONS: 1. Mucinex 600 mg p.o. b.i.d. 2. Guaifenesin DM 5 mL p.o. q.4 h. p.r.n. 3. Ferrous sulfate 325 mg p.o. b.i.d. 4. Flagyl 500 mg p.o. t.i.d. 5. Florastor 250 mg p.o. b.i.d. 6. Vitamin D3, 1000 units 1 tablet p.o. daily. 7. Calcium 600 mg 1 tablet p.o. b.i.d. 8. Prednisone 5 mg p.o. daily. 9. Prilosec 20 mg p.o. b.i.d. 10.Lyrica 200 mg p.o. t.i.d. 11.Fentanyl 50 mcg per hour 1 patch transdermal q.72 h. 12.Nortriptyline 75 mg 1 capsule p.o. nightly. 13.Diazepam 5 mg p.o. q.6 h p.r.n. 14.Cymbalta 60 mg p.o. b.i.d. 15.Flexeril 10 mg p.o. nightly. 16.Avelox 400 mg p.o. daily.  ADMISSION HISTORY:  This is a 60 year old female, former smoker, with a history of steroid-dependent rheumatoid arthritis.  She had a recent episode of diverticulitis and was treated with ciprofloxacin and Flagyl. The patient had persistent right upper quadrant pain as well as right- sided chest pain.  She had a fever of 103 and was brought to the emergency  room for evaluation.  The patient had a CT scan done recently, which showed possible right lung mass.  She was scheduled to have a PET scan as well as possible biopsy with Dr. Marchelle Gearing as an outpatient. She presents to the emergency room with productive cough as well as fever of 103.  It is felt that she may have a possible postobstructive pneumonia.  She was subsequently admitted for further evaluation.  For details, please refer to the history and physical per Dr. Blake Divine on August 14th.  HOSPITAL COURSE: 1. Right lung mass.  The patient was seen in consultation by Dr. Sherene Sires     from the pulmonary service and underwent fiberoptic bronchoscopy on     August 16th.  Biopsy samples were taken but the results are     pending.  Respiratory culture preliminary showed no organisms.  AFB     smear pulmonary also showed no acid fast bacilli.  Fungal smears     did not show any history of fungal elements, which is also     preliminary.  The patient was started initially on vancomycin and     Zosyn and this was switched over to Avelox.  She is currently     afebrile.  She does have a productive cough, but is off oxygen and  reports feeling back to her baseline.  She will complete a total of     10 days antibiotics and follow up with pulmonary service.  She may     follow up with Dr. Marchelle Gearing for biopsy results.  Of note, she also     did have a PET scan while here in the hospital, which showed     intense uptake in the right middle lobe at the site of her possible     mass, otherwise no other signs of metastases were identified.  The     patient will follow up with Dr. Marchelle Gearing for further treatment.     She has been cleared for discharge by Dr. Sherene Sires. 2. C difficile colitis.  The patient started to have multiple bowel     movements and was also febrile.  She did test positive for C     difficile and was started on Flagyl.  She reports resolution of her     diarrhea and will complete a total of  14 days of Flagyl.  We have     also added a probiotic to her regimen. 3. Rheumatoid arthritis.  The patient was continued on her home dose     of prednisone and has not had any acute issues with her arthritis.  DIAGNOSTIC IMAGING: 1. Chest x-ray on admission shows a mass within the right middle lobe     as described above, which may represent primary carcinoma of the     lung versus an inflammatory process.  Followup chest CT maybe     helpful to determine any significant interval change. 2. CT abdomen and pelvis on August 14th shows no evidence of bowel     obstruction, colonic diverticulosis without associated inflammatory     changes prior inflammatory stranding in the left mid abdomen has     resolved 5.9 x 4.8 cm mass, likely opacity with central necrosis     and surrounding inflammatory changes in the right middle lobe,     neoplasm not excluded, small right pleural effusion increased. 3. 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  Given the surrounding mid inflammatory     change, pneumonia cannot be excluded, but is felt less likely if     they are infectious symptoms, an antibiotic therapy and short-term     imaging followup recommended.  If no such symptoms, antibiotic     should be considered.  No evidence of thoracic nodes or     extrathoracic metastasis, small right pleural effusion with     adjacent dependant atelectasis, splenomegaly, diffuse increased     activity throughout the marrow space, nonspecific, no focal     abnormality to suggest osseous metastases. 4. Chest x-ray, August 16th shows stable right middle lobe opacity,     suspected small right pleural effusion, no pneumothorax.  PROCEDURES:  Fiberoptic bronchoscopy on August 16th by Dr. Sherene Sires.  DISCHARGE INSTRUCTIONS:  The patient should continue on a low-salt diet, conduct her activity as  tolerated.  She will follow up with Dr. Marchelle Gearing next week for results of her biopsies from the bronchoscopy and any further treatment as appropriate, she needs to see her primary care physician in the next 1 week and her activities as tolerated.  CONDITION AT THE TIME OF DISCHARGE:  Improved.     Erick Blinks, MD  JM/MEDQ  D:  01/24/2011  T:  01/25/2011  Job:  [id number]  cc:   Dr. Bethena Midget, MD [address] [place] [address] [place] [address]  Electronically Signed by Erick Blinks  on 02/04/2011 04:22:45 PM

## 2011-02-07 ENCOUNTER — Other Ambulatory Visit: Payer: Self-pay | Admitting: Internal Medicine

## 2011-02-07 ENCOUNTER — Ambulatory Visit
Admission: RE | Admit: 2011-02-07 | Discharge: 2011-02-07 | Disposition: A | Discharge status: Home / Self Care | Payer: Medicare Other | Source: Ambulatory Visit | Attending: Internal Medicine | Admitting: Internal Medicine

## 2011-02-07 ENCOUNTER — Encounter: Payer: Self-pay | Admitting: Internal Medicine

## 2011-02-07 ENCOUNTER — Other Ambulatory Visit (HOSPITAL_COMMUNITY): Payer: Self-pay | Admitting: Internal Medicine

## 2011-02-07 DIAGNOSIS — R1011 Right upper quadrant pain: Secondary | ICD-10-CM

## 2011-02-07 DIAGNOSIS — J90 Pleural effusion, not elsewhere classified: Secondary | ICD-10-CM

## 2011-02-08 ENCOUNTER — Ambulatory Visit (HOSPITAL_COMMUNITY)
Admission: RE | Admit: 2011-02-08 | Discharge: 2011-02-08 | Disposition: A | Discharge status: Home / Self Care | Payer: Medicare Other | Source: Ambulatory Visit | Attending: Internal Medicine | Admitting: Internal Medicine

## 2011-02-08 ENCOUNTER — Other Ambulatory Visit (HOSPITAL_COMMUNITY): Payer: Self-pay | Admitting: Internal Medicine

## 2011-02-08 DIAGNOSIS — J90 Pleural effusion, not elsewhere classified: Secondary | ICD-10-CM

## 2011-02-08 LAB — CBC
Hemoglobin: 9.4 g/dL — ABNORMAL LOW (ref 12.0–15.0)
MCH: 26.2 pg (ref 26.0–34.0)
RBC: 3.59 MIL/uL — ABNORMAL LOW (ref 3.87–5.11)

## 2011-02-08 LAB — DIFFERENTIAL
Eosinophils Relative: 0 % (ref 0–5)
Lymphs Abs: 3 10*3/uL (ref 0.7–4.0)
Monocytes Relative: 17 % — ABNORMAL HIGH (ref 3–12)

## 2011-02-08 LAB — COMPREHENSIVE METABOLIC PANEL
AST: 10 U/L (ref 0–37)
Albumin: 2.6 g/dL — ABNORMAL LOW (ref 3.5–5.2)
Chloride: 95 mEq/L — ABNORMAL LOW (ref 96–112)
Creatinine, Ser: 0.52 mg/dL (ref 0.50–1.10)
Potassium: 3.3 mEq/L — ABNORMAL LOW (ref 3.5–5.1)
Total Bilirubin: 0.4 mg/dL (ref 0.3–1.2)
Total Protein: 7.9 g/dL (ref 6.0–8.3)

## 2011-02-08 LAB — TRIGLYCERIDES: Triglycerides: 141 mg/dL (ref <150)

## 2011-02-11 ENCOUNTER — Emergency Department (HOSPITAL_COMMUNITY): Payer: Medicare Other

## 2011-02-11 ENCOUNTER — Inpatient Hospital Stay (HOSPITAL_COMMUNITY)
Admission: EM | Admit: 2011-02-11 | Discharge: 2011-02-18 | DRG: 194 | Disposition: A | Discharge status: Home / Self Care | Payer: Medicare Other | Attending: Internal Medicine | Admitting: Internal Medicine

## 2011-02-11 DIAGNOSIS — Z9889 Other specified postprocedural states: Secondary | ICD-10-CM

## 2011-02-11 DIAGNOSIS — I959 Hypotension, unspecified: Secondary | ICD-10-CM | POA: Diagnosis present

## 2011-02-11 DIAGNOSIS — G8929 Other chronic pain: Secondary | ICD-10-CM | POA: Diagnosis present

## 2011-02-11 DIAGNOSIS — B9689 Other specified bacterial agents as the cause of diseases classified elsewhere: Secondary | ICD-10-CM | POA: Diagnosis present

## 2011-02-11 DIAGNOSIS — IMO0001 Reserved for inherently not codable concepts without codable children: Secondary | ICD-10-CM | POA: Diagnosis present

## 2011-02-11 DIAGNOSIS — F17200 Nicotine dependence, unspecified, uncomplicated: Secondary | ICD-10-CM | POA: Diagnosis present

## 2011-02-11 DIAGNOSIS — K219 Gastro-esophageal reflux disease without esophagitis: Secondary | ICD-10-CM | POA: Diagnosis present

## 2011-02-11 DIAGNOSIS — N179 Acute kidney failure, unspecified: Secondary | ICD-10-CM | POA: Diagnosis present

## 2011-02-11 DIAGNOSIS — D708 Other neutropenia: Secondary | ICD-10-CM | POA: Diagnosis present

## 2011-02-11 DIAGNOSIS — E876 Hypokalemia: Secondary | ICD-10-CM | POA: Diagnosis present

## 2011-02-11 DIAGNOSIS — E871 Hypo-osmolality and hyponatremia: Secondary | ICD-10-CM | POA: Diagnosis present

## 2011-02-11 DIAGNOSIS — J90 Pleural effusion, not elsewhere classified: Secondary | ICD-10-CM | POA: Diagnosis present

## 2011-02-11 DIAGNOSIS — D638 Anemia in other chronic diseases classified elsewhere: Secondary | ICD-10-CM | POA: Diagnosis present

## 2011-02-11 DIAGNOSIS — J189 Pneumonia, unspecified organism: Principal | ICD-10-CM | POA: Diagnosis present

## 2011-02-11 DIAGNOSIS — IMO0002 Reserved for concepts with insufficient information to code with codable children: Secondary | ICD-10-CM

## 2011-02-11 DIAGNOSIS — R5081 Fever presenting with conditions classified elsewhere: Secondary | ICD-10-CM | POA: Diagnosis present

## 2011-02-11 DIAGNOSIS — M069 Rheumatoid arthritis, unspecified: Secondary | ICD-10-CM | POA: Diagnosis present

## 2011-02-11 DIAGNOSIS — R222 Localized swelling, mass and lump, trunk: Secondary | ICD-10-CM | POA: Diagnosis present

## 2011-02-11 DIAGNOSIS — R197 Diarrhea, unspecified: Secondary | ICD-10-CM | POA: Diagnosis present

## 2011-02-11 LAB — DIFFERENTIAL
Basophils Absolute: 0 10*3/uL (ref 0.0–0.1)
Lymphocytes Relative: 85 % — ABNORMAL HIGH (ref 12–46)
Monocytes Relative: 11 % (ref 3–12)

## 2011-02-11 LAB — CBC
MCV: 82 fL (ref 78.0–100.0)
Platelets: 185 10*3/uL (ref 150–400)
RDW: 18.2 % — ABNORMAL HIGH (ref 11.5–15.5)
WBC: 3.6 10*3/uL — ABNORMAL LOW (ref 4.0–10.5)

## 2011-02-11 LAB — POCT I-STAT, CHEM 8
Glucose, Bld: 125 mg/dL — ABNORMAL HIGH (ref 70–99)
HCT: 30 % — ABNORMAL LOW (ref 36.0–46.0)
Hemoglobin: 10.2 g/dL — ABNORMAL LOW (ref 12.0–15.0)
Potassium: 3.2 mEq/L — ABNORMAL LOW (ref 3.5–5.1)
Sodium: 134 mEq/L — ABNORMAL LOW (ref 135–145)

## 2011-02-11 LAB — PROTIME-INR
INR: 1.39 (ref 0.00–1.49)
Prothrombin Time: 17.3 seconds — ABNORMAL HIGH (ref 11.6–15.2)

## 2011-02-11 LAB — URINALYSIS, ROUTINE W REFLEX MICROSCOPIC
Leukocytes, UA: NEGATIVE
Nitrite: NEGATIVE
Specific Gravity, Urine: 1.016 (ref 1.005–1.030)
pH: 5.5 (ref 5.0–8.0)

## 2011-02-11 LAB — PATHOLOGIST SMEAR REVIEW

## 2011-02-11 LAB — MRSA PCR SCREENING: MRSA by PCR: NEGATIVE

## 2011-02-11 LAB — PRO B NATRIURETIC PEPTIDE: Pro B Natriuretic peptide (BNP): 678.3 pg/mL — ABNORMAL HIGH (ref 0–125)

## 2011-02-12 ENCOUNTER — Other Ambulatory Visit: Payer: Self-pay | Admitting: Interventional Radiology

## 2011-02-12 ENCOUNTER — Inpatient Hospital Stay (HOSPITAL_COMMUNITY): Payer: Medicare Other

## 2011-02-12 DIAGNOSIS — D61818 Other pancytopenia: Secondary | ICD-10-CM

## 2011-02-12 LAB — CBC
HCT: 24 % — ABNORMAL LOW (ref 36.0–46.0)
HCT: 21.9 % — ABNORMAL LOW (ref 36.0–46.0)
MCH: 26 pg (ref 26.0–34.0)
MCHC: 32.4 g/dL (ref 30.0–36.0)
MCV: 80.2 fL (ref 78.0–100.0)
Platelets: 194 10*3/uL (ref 150–400)
Platelets: 175 10*3/uL (ref 150–400)
RDW: 17.9 % — ABNORMAL HIGH (ref 11.5–15.5)
RDW: 17.4 % — ABNORMAL HIGH (ref 11.5–15.5)
WBC: 0.5 10*3/uL — CL (ref 4.0–10.5)

## 2011-02-12 LAB — URINE CULTURE
Colony Count: NO GROWTH
Culture: NO GROWTH

## 2011-02-12 LAB — DIFFERENTIAL
Band Neutrophils: 0 % (ref 0–10)
Basophils Absolute: 0 10*3/uL (ref 0.0–0.1)
Eosinophils Absolute: 0 10*3/uL (ref 0.0–0.7)
Monocytes Absolute: 0 10*3/uL — ABNORMAL LOW (ref 0.1–1.0)
nRBC: 0 /100 WBC

## 2011-02-12 LAB — SODIUM, URINE, RANDOM: Sodium, Ur: 25 mEq/L

## 2011-02-12 LAB — COMPREHENSIVE METABOLIC PANEL
ALT: 6 U/L (ref 0–35)
AST: 9 U/L (ref 0–37)
Albumin: 2.1 g/dL — ABNORMAL LOW (ref 3.5–5.2)
Alkaline Phosphatase: 32 U/L — ABNORMAL LOW (ref 39–117)
BUN: 8 mg/dL (ref 6–23)
Chloride: 105 mEq/L (ref 96–112)
Potassium: 3.5 mEq/L (ref 3.5–5.1)
Sodium: 137 mEq/L (ref 135–145)
Total Bilirubin: 0.2 mg/dL — ABNORMAL LOW (ref 0.3–1.2)

## 2011-02-12 LAB — CREATININE, URINE, RANDOM: Creatinine, Urine: 28.2 mg/dL

## 2011-02-12 NOTE — H&P (Signed)
NAME:  [name], [name]            ACCOUNT NO.:  [id number]  MEDICAL RECORD NO.:  [id number]  LOCATION:  WLED                         FACILITY:  [facility]  PHYSICIAN:  Marinda Elk, M.D.DATE OF BIRTH:  Oct 19, 1950  DATE OF ADMISSION:  02/11/2011 DATE OF DISCHARGE:                             HISTORY & PHYSICAL   PRIMARY CARE DOCTOR:  Dr. Ronne Binning.  PULMONOLOGIST:  Dr. Marchelle Gearing.  HISTORY OF PRESENT ILLNESS:  This is a 60 year old female recently discharged from the hospital on January 25, 2011 for right-sided pneumonia, possible post obstructive with bronchoscopy at that time with pathology showing benign lung, negative for dysplasia or malignancy. Also during that time, she developed Clostridium difficile colitis and also was to follow up with Pulmonology as an outpatient.  She started having fever 2 or 3 days after she left the hospital with progressive weakness.  She has also been complaining that she slipped today, but did not hit her head.  She was so weak when she stood up and felt dizzy and she kind of slipped.  She did not lost consciousness at that time, was not complaining of any joint pain.  She checked her temperature at home. It was 102.  When she slipped and almost fell, she did not fall because she was caught by her husband.  She decided to come here into the ED. Her in the ED, temperature was checked, it was 102.  She has been coughing ever since and has not stopped and her cough has not gotten better.  She relates no nausea, vomiting, diarrhea, chest pain, or shortness of breath.  ALLERGIES:  PENICILLIN, she gets itching.  PAST MEDICAL HISTORY: 1. Right-sided pneumonia with possible post obstruction, Clostridium     difficile colitis. 2. Right middle lobe mass for outpatient workup. 3. History of rheumatoid arthritis, on chronic steroid. 4. Fibromyalgia. 5. Chronic anemia. 6. Chronic traumatic wound to the right lower extremity, status post      graft. 7. Respiratory failure secondary to #1, now resolved. 8. History of diverticulosis. 9. History of chronic pain syndrome. 10.History of GERD.  DISCHARGE MEDICATIONS: 1. Calcium carbonate 600 mg p.o. b.i.d. 2. Cymbalta 60 mg p.o. b.i.d. 3. Vicodin 7.5/650 mg p.o. q.6 h. p.r.n. 4. Vitamin D 1000 units daily. 5. Florastor 1 capsule b.i.d. 6. Prilosec 20 mg b.i.d. 7. Prednisone 5 mg daily. 8. Nortriptyline 75 mg at bedtime. 9. Flagyl 500 mg p.o. daily. 10.Lyrica 200 mg capsule t.i.d. 11.Flexeril 10 mg daily. 12.Ferrous sulfate 325 mg b.i.d. 13.Fentanyl patch 50 mcg every 3 days. 14.Diazepam 5 mg p.o. q.6 h.  PAST SURGICAL HISTORY:  Multiple surgeries on her shoulder, hysterectomy, and appendectomy.  SOCIAL HISTORY:  Lives with her husband, chronic smoker, drinks alcohol occasionally.  Denies drugs.  FAMILY HISTORY:  Noncontributory.  PHYSICAL EXAMINATION:  VITAL SIGNS:  Temperature 102.5, heart rate of 115, and blood pressure 108/43.  She is saturating 100% on room air and breathing 20 times per minute. GENERAL:  She is awake, alert, and oriented x3, coherent for language. HEENT:  Atraumatic and normocephalic.  Anicteric.  No pallor. CARDIOVASCULAR:  She has a regular rate and rhythm with positive S1 and S2.  No murmurs, rubs, or gallops.  LUNGS:  Good air movement, clear to auscultation. ABDOMEN:  Positive bowel sounds, nontender, nondistended, and soft. EXTREMITIES:  Positive pulses.  No clubbing, cyanosis, or edema. SKIN:  She has no rashes or ulceration except for the graft on her right lower extremity, which is not infected. NEUROLOGIC:  Nonfocal.  LABORATORY DATA:  Labs on admission shows pathology smear, pancytopenia. Her lactic acid was 1.2.  Her procalcitonin was 0.32 and BNP was 678. Her white count was 3.6 with an ANC of 0.1.  Her hemoglobin is 8.9 with an MCV of 82, and platelet count 185 with RDW of 18.2.  Her first set of cardiac enzymes is negative.   Her sodium is 134, potassium 3.2, chloride 92, glucose of 125, BUN of 5, creatinine of 1.0, baseline 0.4, and bicarbonate of 25.  Her D-dimer was positive at 2.2.  Her UA does not show any signs of infection.  Pevic abdominal x-ray showed no fracture or dislocation in the pelvis.  C-spine x-rays showed no fracture or dislocation.  DJD of C6-C7.  Her chest x-ray showed increased small right pleural effusion and right lower lobe infiltrate.  ASSESSMENT AND PLAN: 1. Healthcare-associated pneumonia, cough, and fever, She was recently      in the hospital. She was here for more than 3 days, started her on      vancomycin and Zosyn, monitor saturations, admitted her to step-down.       We will get sputum cultures and blood cultures.  We will give her      stress dose steroid as her blood pressure is borderline.  We will      also try to tap that pleural effusion to see if we can get any      kind of organism or any malignancy cells.  So, we will send for      cytology also. 2. Hyponatremia and hypokalemia most likely secondary to #3.  We will     give her IV fluids.  Check a BMET.  Replete potassium and check her     magnesium.  Check her urine sodium and urine creatinine.  This also     could be relative adrenal insufficiency.  So we will give her     stress dose steroids. 3. Acute kidney injury.  This is secondary to #1 and decreased p.o.     intake.  We will give her IV fluids. 4. Leukopenia probably secondary to #1.  Continue to monitor. 5. Hypotension.  We will bolus her here in the ED.  We will admit her     to step-down, do strict intake and outputs.  Her lactic is normal.     So at this time, we will not consult CCM.  I do not think she needs     pressors.  We will go for her stress dose steroids.     As she is having fever, has hyponatremia, and is  little bit borderline      hypotensive.  She might be in relative adrenal  insufficiency. 6. Anemia of chronic disease.  We will type  and screen 1 unit and we     will monitor her hemoglobin in case she needs transfusion after     necessitation. 7. Pleural effusion.  We will tap till IR to tap and send for     Cytology.     Marinda Elk, M.D.     AF/MEDQ  D:  02/11/2011  T:  02/11/2011  Job:  [id number]  cc:   Dr. Ronne Binning.  Electronically Signed by Marinda Elk M.D. on 02/12/2011 08:40:12 AM

## 2011-02-13 ENCOUNTER — Ambulatory Visit: Payer: Medicare Other | Admitting: Adult Health

## 2011-02-13 DIAGNOSIS — D61818 Other pancytopenia: Secondary | ICD-10-CM

## 2011-02-13 LAB — BASIC METABOLIC PANEL
Chloride: 107 mEq/L (ref 96–112)
Glucose, Bld: 141 mg/dL — ABNORMAL HIGH (ref 70–99)
Potassium: 3 mEq/L — ABNORMAL LOW (ref 3.5–5.1)
Sodium: 140 mEq/L (ref 135–145)

## 2011-02-13 LAB — DIFFERENTIAL
Basophils Absolute: 0 10*3/uL (ref 0.0–0.1)
Eosinophils Relative: 0 % (ref 0–5)
Lymphocytes Relative: 50 % — ABNORMAL HIGH (ref 12–46)
Lymphs Abs: 0.5 10*3/uL — ABNORMAL LOW (ref 0.7–4.0)
Monocytes Relative: 27 % — ABNORMAL HIGH (ref 3–12)
Neutrophils Relative %: 23 % — ABNORMAL LOW (ref 43–77)

## 2011-02-13 LAB — CBC
Hemoglobin: 7.8 g/dL — ABNORMAL LOW (ref 12.0–15.0)
MCHC: 31.6 g/dL (ref 30.0–36.0)
RBC: 3.05 MIL/uL — ABNORMAL LOW (ref 3.87–5.11)
WBC: 1.1 10*3/uL — CL (ref 4.0–10.5)

## 2011-02-13 LAB — VANCOMYCIN, TROUGH: Vancomycin Tr: 12.6 ug/mL (ref 10.0–20.0)

## 2011-02-13 LAB — IGG, IGA, IGM
IgA: 256 mg/dL (ref 69–380)
IgM, Serum: 52 mg/dL — ABNORMAL LOW (ref 52–322)

## 2011-02-14 LAB — CBC
HCT: 23.8 % — ABNORMAL LOW (ref 36.0–46.0)
Hemoglobin: 7.6 g/dL — ABNORMAL LOW (ref 12.0–15.0)
WBC: 1.8 10*3/uL — ABNORMAL LOW (ref 4.0–10.5)

## 2011-02-14 LAB — DIFFERENTIAL
Eosinophils Relative: 1 % (ref 0–5)
Monocytes Relative: 15 % — ABNORMAL HIGH (ref 3–12)
Neutrophils Relative %: 28 % — ABNORMAL LOW (ref 43–77)

## 2011-02-14 LAB — BASIC METABOLIC PANEL
BUN: 6 mg/dL (ref 6–23)
CO2: 26 mEq/L (ref 19–32)
Chloride: 107 mEq/L (ref 96–112)
Glucose, Bld: 107 mg/dL — ABNORMAL HIGH (ref 70–99)
Potassium: 3.3 mEq/L — ABNORMAL LOW (ref 3.5–5.1)

## 2011-02-14 LAB — CULTURE, RESPIRATORY W GRAM STAIN

## 2011-02-15 LAB — DIFFERENTIAL
Eosinophils Relative: 1 % (ref 0–5)
Lymphs Abs: 0.7 10*3/uL (ref 0.7–4.0)
Monocytes Relative: 10 % (ref 3–12)
Neutro Abs: 1.1 10*3/uL — ABNORMAL LOW (ref 1.7–7.7)

## 2011-02-15 LAB — CBC
Hemoglobin: 8.5 g/dL — ABNORMAL LOW (ref 12.0–15.0)
MCH: 25.6 pg — ABNORMAL LOW (ref 26.0–34.0)
MCV: 83.4 fL (ref 78.0–100.0)
RBC: 3.32 MIL/uL — ABNORMAL LOW (ref 3.87–5.11)

## 2011-02-16 LAB — DIFFERENTIAL
Basophils Relative: 1 % (ref 0–1)
Eosinophils Relative: 1 % (ref 0–5)
Monocytes Absolute: 0.4 10*3/uL (ref 0.1–1.0)
Neutrophils Relative %: 41 % — ABNORMAL LOW (ref 43–77)
WBC Morphology: INCREASED

## 2011-02-16 LAB — CBC
HCT: 24.4 % — ABNORMAL LOW (ref 36.0–46.0)
Hemoglobin: 7.9 g/dL — ABNORMAL LOW (ref 12.0–15.0)
MCHC: 32.4 g/dL (ref 30.0–36.0)
RBC: 2.98 MIL/uL — ABNORMAL LOW (ref 3.87–5.11)

## 2011-02-17 DIAGNOSIS — D709 Neutropenia, unspecified: Secondary | ICD-10-CM

## 2011-02-17 DIAGNOSIS — J189 Pneumonia, unspecified organism: Secondary | ICD-10-CM

## 2011-02-17 LAB — DIFFERENTIAL
Basophils Relative: 0 % (ref 0–1)
Eosinophils Absolute: 0 10*3/uL (ref 0.0–0.7)
Eosinophils Relative: 0 % (ref 0–5)
Lymphocytes Relative: 43 % (ref 12–46)
Monocytes Absolute: 0.4 10*3/uL (ref 0.1–1.0)
Neutrophils Relative %: 40 % — ABNORMAL LOW (ref 43–77)

## 2011-02-17 LAB — BASIC METABOLIC PANEL
CO2: 32 mEq/L (ref 19–32)
Chloride: 101 mEq/L (ref 96–112)
Chloride: 102 mEq/L (ref 96–112)
GFR calc Af Amer: >60 mL/min (ref >60)
GFR calc Af Amer: >60 mL/min (ref >60)
GFR calc non Af Amer: >60 mL/min (ref >60)
Potassium: 2.5 mEq/L — CL (ref 3.5–5.1)
Potassium: 2.7 mEq/L — CL (ref 3.5–5.1)
Sodium: 141 mEq/L (ref 135–145)
Sodium: 142 mEq/L (ref 135–145)

## 2011-02-17 LAB — CBC
HCT: 25.1 % — ABNORMAL LOW (ref 36.0–46.0)
MCHC: 31.1 g/dL (ref 30.0–36.0)
RDW: 18 % — ABNORMAL HIGH (ref 11.5–15.5)
WBC: 2.6 10*3/uL — ABNORMAL LOW (ref 4.0–10.5)

## 2011-02-17 LAB — CULTURE, BLOOD (ROUTINE X 2)
Culture  Setup Time: 201209042318
Culture  Setup Time: 201209042258
Culture: NO GROWTH

## 2011-02-17 LAB — MAGNESIUM: Magnesium: 1.5 mg/dL (ref 1.5–2.5)

## 2011-02-18 LAB — CBC
MCV: 83 fL (ref 78.0–100.0)
Platelets: 108 10*3/uL — ABNORMAL LOW (ref 150–400)
RDW: 18.6 % — ABNORMAL HIGH (ref 11.5–15.5)
WBC: 2.8 10*3/uL — ABNORMAL LOW (ref 4.0–10.5)

## 2011-02-18 LAB — DIFFERENTIAL
Basophils Absolute: 0 10*3/uL (ref 0.0–0.1)
Eosinophils Relative: 1 % (ref 0–5)
Lymphocytes Relative: 41 % (ref 12–46)
Lymphs Abs: 1.1 10*3/uL (ref 0.7–4.0)
Monocytes Relative: 14 % — ABNORMAL HIGH (ref 3–12)
Neutrophils Relative %: 43 % (ref 43–77)

## 2011-02-18 LAB — BASIC METABOLIC PANEL
Calcium: 8.6 mg/dL (ref 8.4–10.5)
Chloride: 104 mEq/L (ref 96–112)
Creatinine, Ser: 0.91 mg/dL (ref 0.50–1.10)
GFR calc Af Amer: >60 mL/min (ref >60)
Sodium: 144 mEq/L (ref 135–145)

## 2011-02-18 NOTE — Discharge Summary (Addendum)
  [name], [name]            ACCOUNT NO.:  [id number]  MEDICAL RECORD NO.:  [id number]  LOCATION:  1406                         FACILITY:  [facility]  PHYSICIAN:  Brendia Sacks, MD    DATE OF BIRTH:  12-01-1950  DATE OF ADMISSION:  02/11/2011 DATE OF DISCHARGE:  02/18/2011                        DISCHARGE SUMMARY - REFERRING   ADDENDUM:  FOLLOWUP:  The patient has an appointment with Dr. Marchelle Gearing on Thursday of this week.  Could consider repeat BMET to check her potassium level.  The patient is being discharged on 40 mEq of potassium b.i.d. for some hypokalemia experienced inpatient. Alternatively, BMET could be followed up at [facility] or PCP.     Gwenyth Bender, NP   ______________________________ Brendia Sacks, MD    KMB/MEDQ  D:  02/18/2011  T:  02/18/2011  Job:  [id number]  Electronically Signed by Brendia Sacks  on 02/18/2011 09:43:11 PM Electronically Signed by Toya Smothers  on 02/27/2011 07:55:10 AM

## 2011-02-18 NOTE — Discharge Summary (Addendum)
NAMEMarland Knox  BRYCELYNN, STAMPLEY            ACCOUNT NO.:  [id number]  MEDICAL RECORD NO.:  [id number]  LOCATION:  1406                         FACILITY:  [facility]  PHYSICIAN:  Brendia Sacks, MD    DATE OF BIRTH:  1951/01/24  DATE OF ADMISSION:  02/11/2011 DATE OF DISCHARGE:  02/18/2011                        DISCHARGE SUMMARY - REFERRING   PRIMARY CARE PROVIDER:  Thayer Headings, M.D.  PULMONOLOGIST:  Kalman Shan, MD.  ONCOLOGIST:  Samul Dada, M.D.  DISCHARGE DIAGNOSES: 1. Healthcare-acquired pneumonia/right middle lobe mass. 2. Neutropenic fever. 3. Diarrhea. 4. Normocytic anemia. 5. Autoimmune neutropenia. 6. Rheumatoid arthritis.  DISCHARGE MEDICATIONS: 1. Neupogen 480 mcg per 1.6 mL, 480 mcg subcutaneously q. 24. 2. Potassium chloride 40 mEq p.o. b.i.d. 3. Calcium one tablet p.o. b.i.d. 4. Cymbalta 60 mg p.o. b.i.d. 5. Diazepam 5 mg p.o. every 6 hours as needed for anxiety. 6. Fentanyl patch 50 mcg/hr one patch transdermally every 3 days. 7. Flexeril 10 mg p.o. daily at bedtime as needed for muscle spasms. 8. Ferrous sulfate 325 mg one tablet p.o. b.i.d. 9. Hydrocodone/APAP 7.5/650 mg one tablet p.o. every 6 hours as needed     for pain. 10.Lyrica 200 mg p.o. t.i.d. 11.Nortriptyline 75 mg p.o. daily at bedtime. 12.Prednisone 5 mg p.o. daily. 13.Prilosec 20 mg p.o. b.i.d. 14.Florastor 250 mg p.o. b.i.d. 15.Vitamin D3 1000 units one tablet p.o. daily.  DIAGNOSTIC LABS:  WBCs 3.6, hemoglobin 8.9, hematocrit 28.3, MCH 25.8, neutrophils 2%.  Sodium 134, potassium 3.2, chloride 96, CO2 of 25, BUN 5, creatinine 1, glucose 125.  D-dimer was 2.26.  Urinalysis was negative.  Troponin-1 0.00.  Pro-BNP is 678.3.  Procalcitonin was 0.32. Lactic acid was 1.2.  MRSA PCR screening negative.  Pathologist smear review yields pancytopenia.  Magnesium 1.7, PT 17.3, INR 1.39.  C diff by PCR is negative.  Urine sodium 25, urine creatinine 28.2, IgA 256, IgG 1790, IgM 52.  Blood  cultures yield no growth x2 to date. Respiratory culture yields moderate white blood cells present, predominantly PMN, few squamous epithelial  cells, rare gram-positive cocci in pairs, gram-positive rods.  Urine culture shows no growth to date.  PATHOLOGY:  Cytology of pleural fluid yields atypical cells. Morphological features and immunophenotype favor reactive mesothelial cells.  DIAGNOSTIC IMAGING: 1. Chest x-ray on September 4:  Increased small right pleural effusion     and right lower lung infiltrate and/or atelectasis. 2. C-spine x-ray on September 4 yields no fracture or dislocation     seen.  Degenerative changes, most severe at C6-7.  Mild     anterolisthesis of C7 on T1. 3. Pelvic x-ray yields no fracture dislocation. 4. Chest x-ray on September 5 yields no pneumothorax, decreased right     effusion. 5. Ultrasound-guided right thoracentesis on September 5 yielded 400 mL     of pleural fluid.  CONSULTATIONS:  Dr. Arline Asp with Oncology/Hematology.  PROCEDURES:  Ultrasound-guided right thoracentesis on September 5.  BRIEF HISTORY:  [name] is a 60 year old female, recently discharged from the hospital for right-sided pneumonia, possible postobstructive with bronchitis at that time with pathology showing benign lung, negative for dysplasia or malignancy.  Also during that time, she developed C-diff and was also to follow up  with Pulmonology as an outpatient.  Two days prior to her presentation on September 4, she developed progressive weakness.  She also complained of a fall and stated she did not hit her head.  She indicated that she was very weak, felt dizzy and that she just slept.  She denied any loss of consciousness.  She checked her temperature and indicated that it was 102.  She came to the emergency room and in the emergency room at [facility], her temperature was 102.  She indicated she had been coughing ever since she was discharged.  She denied nausea,  vomiting, diarrhea, chest pain, or shortness of breath.  She was admitted by the hospitalist for further workup and treatment.  HOSPITAL COURSE BY PROBLEM: 1. Healthcare-associated pneumonia.  She was admitted to the step-down     unit.  She was provided with IV fluids, O2 support.  Blood     cultures, sputum culture, urine cultures were obtained and as     indicated above.  She was started on vancomycin and Zosyn and     completed a 7-day course of same.  On September 5, she underwent a     right-sided thoracentesis, which yielded 400 mL of fluid.  Cytology     dictated as above.  She improved with this course of treatment, was     transferred to Telemetry floor on September 7.  At the time of     discharge, she has completed a 7-day course of vancomycin and     Zosyn.  She is afebrile, nontoxic-appearing. 2. Neutropenic fever.  Dr. Arline Asp from Oncology consulted.  His     office notes indicate the patient has had neutropenia since the     late 1990s.  The patient clinically benign until recently.  Of     note, the patient also receiving prednisone.  At the time of     discharge, the patient is afebrile. 3. Diarrhea.  C-diff PCR was negative.  Flagyl was discontinued on     September 7.  The patient has had no reoccurrence of diarrhea.  No     abdominal pain. 4. Normocytic anemia.  The patient's hemoglobin ranged from 7.8 to 8.1     during her hospitalization. The patient has an appointment with Dr. Arline Asp     who will follow her blood counts on an outpatient basis.  The     patient will continue on her Neupogen.  Dr. Mamie Levers office will     administer this on a daily basis.  Discussed with the patient who     was instructed to call Dr. Mamie Levers office this afternoon after     discharge to make arrangements for daily administration of her     Neupogen. 5. Autoimmune neutropenia, see above. 6. Right mid lobe mass.  The patient has an appointment with Dr.     Marchelle Gearing on  September 13 at 9:30.  Biopsies negative for cancer,     but etiology remains unclear. 7. Rheumatoid arthritis remained stable.  The patient received a     stress dose of steroids.  Is being discharged on her home dose of 5     mg of prednisone.  PHYSICAL EXAM:  VITAL SIGNS:  Temperature 98.5, blood pressure 134/82, respiration 17, heart rate 92, saturations 93% on room air. GENERAL:  Awake, alert, well-nourished, no acute distress. CV:  Regular rate and rhythm.  No murmur, gallop or rub.  No lower extremity edema. RESPIRATORY:  Normal effort.  Breath sounds clear to auscultation.  No rhonchi or wheeze. ABDOMEN:  Round, soft, positive bowel sounds throughout, nontender to palpation. MUSCULOSKELETAL:  Moves all extremities, full range of motion.  No joint swelling.  FOLLOWUP: 1. The patient will see her pulmonologist, Dr. Marchelle Gearing on September     13. 2. Follow up with Dr. Arline Asp.  She will call his office this     afternoon to make arrangements for her daily Neupogen     administration.  She will also schedule an appointment for a     followup lab work to be done within the week.  ACTIVITY:  As tolerated.  DIET:  Regular.  DISPOSITION:  The patient is medically stable and ready for discharge to home.  Time spent on discharge 50 minutes.     Gwenyth Bender, NP   ______________________________ Brendia Sacks, MD    KMB/MEDQ  D:  02/18/2011  T:  02/18/2011  Job:  [id number]  cc:   Thayer Headings, M.D. Fax: [phone]  Kalman Shan, MD [address] [place] [address] [place] [address]  Samul Dada, M.D. Fax: [phone]  Electronically Signed by Brendia Sacks  on 02/18/2011 09:46:47 PM Electronically Signed by Toya Smothers  on 02/27/2011 07:55:14 AM

## 2011-02-19 ENCOUNTER — Encounter (HOSPITAL_BASED_OUTPATIENT_CLINIC_OR_DEPARTMENT_OTHER): Payer: Medicare Other | Admitting: Oncology

## 2011-02-19 DIAGNOSIS — D72819 Decreased white blood cell count, unspecified: Secondary | ICD-10-CM

## 2011-02-19 DIAGNOSIS — D709 Neutropenia, unspecified: Secondary | ICD-10-CM

## 2011-02-20 ENCOUNTER — Encounter: Payer: Self-pay | Admitting: Internal Medicine

## 2011-02-20 ENCOUNTER — Encounter (HOSPITAL_BASED_OUTPATIENT_CLINIC_OR_DEPARTMENT_OTHER): Payer: Medicare Other | Admitting: Oncology

## 2011-02-20 ENCOUNTER — Ambulatory Visit (INDEPENDENT_AMBULATORY_CARE_PROVIDER_SITE_OTHER): Payer: Medicare Other | Admitting: Internal Medicine

## 2011-02-20 VITALS — BP 112/68 | HR 94 | Temp 98.6°F | Ht 62.5 in | Wt 173.6 lb

## 2011-02-20 DIAGNOSIS — D709 Neutropenia, unspecified: Secondary | ICD-10-CM

## 2011-02-20 DIAGNOSIS — R918 Other nonspecific abnormal finding of lung field: Secondary | ICD-10-CM

## 2011-02-20 DIAGNOSIS — R5383 Other fatigue: Secondary | ICD-10-CM

## 2011-02-20 DIAGNOSIS — D72819 Decreased white blood cell count, unspecified: Secondary | ICD-10-CM

## 2011-02-20 DIAGNOSIS — R222 Localized swelling, mass and lump, trunk: Secondary | ICD-10-CM

## 2011-02-20 NOTE — Patient Instructions (Signed)
Please have CT scan chest without contrast for followup around 03/15/11 - few days before or or after ( I am off week of 03/14/11 for recert-exams) I will see you after that If my appointment is much after the CT, I will call you with results I will talk to Dr Dierdre Forth about your rheumatoid status I will let Dr Ronne Binning know about your urination problems

## 2011-02-20 NOTE — Progress Notes (Signed)
Subjective:    Patient ID: [name], female    DOB: 1950/09/25, 60 y.o.   MRN: [id number]  HPI 60 year old female.Ex-smoker. RA on chronic prednisone. Patient of Dr. 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 Husband Janiah Devinney who sees me for asthma.  CT abg lung cut in July 2012 for diverticular symptoms showed rt lung mass. 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 There is also 2.3 subcarinal node. Otherwise negative. These are new since CT chest Nov 2006 and CXR May 2011   OV 02/20/11: She presents for followup of above. Husband with her. Son also present this time. Since above she got admitted 8/14 - 8/17 with C diff colitis. During this time PET scan 01/22/11: PET HOT SUV 10 with worsening size of mass and 9 x 6cm size with development of new Rt pleural effusion in a week . S/p bronch by Dr Sherene Sires 01/23/11: RML orifice noted to be slit like with difficulty to wedge for BAL (concernn for RML syndrome raised) and transbronhical biopsy was non-diagnostic for malignancy. Dishargeed on avelox and flagyl. She was then readmitted 02/11/11 to 02/18/11 for neutropenic fever; presumed source was RML mass/infiltrate. Rx in hospital for 7 days with vanc/zosyn. Of note, on 02/12/11 - 2 days later 400cc thoracentesis done (fluid showed atypical cells and mesothelial cells, no glucse or other workup sent). Currently on neupogen but not on antibiotics  Currently reporting persistence of chroni cough (mild mpderate) and some green sputum for several days but no fever. Fatigue persists. No dyspnea.  No associated chest pain, fever, chills, weight loss. In addition, is having nocturia and polyria. She is due to see Dr Dierdre Forth of rheum (? restablisshin with rheum after a while)  She and family very worried about  symptoms. She is keen to have lobectomy in case cancer or RML syndrome is etiology   Review of Systems  Constitutional: Negative for fever and unexpected weight change.  HENT: Negative for ear pain, nosebleeds, congestion, sore throat, rhinorrhea, sneezing, trouble swallowing, dental problem, postnasal drip and sinus pressure.   Eyes: Negative for redness and itching.  Respiratory: Negative for cough, chest tightness, shortness of breath and wheezing.   Cardiovascular: Negative for palpitations and leg swelling.  Gastrointestinal: Negative for nausea and vomiting.  Genitourinary: Negative for dysuria.  Musculoskeletal: Negative for joint swelling.  Skin: Negative for rash.  Neurological: Negative for numbness and headaches.  Hematological: Does not bruise/bleed easily.  Psychiatric/Behavioral: Negative for dysphoric mood. The patient is not nervous/anxious.        Objective:   Physical Exam Vitals reviewed. Constitutional: She is oriented to person, place, and time. She appears well-developed and well-nourished. No distress.       Obese cushingoid  HENT:  Head: Normocephalic and atraumatic.  Right Ear: External ear normal.  Left Ear: External ear normal.  Mouth/Throat: Oropharynx is clear and moist. No oropharyngeal exudate.  Eyes: Conjunctivae and EOM are normal. Pupils are equal, round, and reactive to light. Right eye exhibits no discharge. Left eye exhibits no discharge. No scleral icterus.  Neck: Normal range of motion. Neck supple. No JVD present. No tracheal deviation present. No thyromegaly present.  Cardiovascular: Normal rate, regular rhythm, normal heart sounds and intact distal pulses.  Exam reveals no gallop and no friction rub.  No murmur heard. Pulmonary/Chest: Effort normal and breath sounds normal. No respiratory distress. She has no wheezes. She has no rales. She exhibits no tenderness.  Abdominal: Soft. Bowel sounds are normal. She exhibits no distension and no  mass. There is no tenderness. There is no rebound and no guarding.  Musculoskeletal: Normal range of motion. She exhibits no edema and no tenderness.       RA joint deformities Left knee scar Ace wrap in left shin Rt knee OA  Lymphadenopathy:    She has no cervical adenopathy.  Neurological: She is alert and oriented to person, place, and time. She has normal reflexes. No cranial nerve deficit. She exhibits normal muscle tone. Coordination normal.  Skin: Skin is warm and dry. No rash noted. She is not diaphoretic. No erythema. No pallor.  Psychiatric: Her behavior is normal. Judgment and thought content normal.       Flat affect Anxious about pottential cancer diagnosis        Assessment & Plan:

## 2011-02-21 ENCOUNTER — Encounter (HOSPITAL_BASED_OUTPATIENT_CLINIC_OR_DEPARTMENT_OTHER): Payer: Medicare Other | Admitting: Oncology

## 2011-02-21 DIAGNOSIS — D72819 Decreased white blood cell count, unspecified: Secondary | ICD-10-CM

## 2011-02-21 DIAGNOSIS — D709 Neutropenia, unspecified: Secondary | ICD-10-CM

## 2011-02-21 LAB — FUNGUS CULTURE W SMEAR: Fungal Smear: NONE SEEN

## 2011-02-21 NOTE — Assessment & Plan Note (Addendum)
So far the needle suspicion is lower for malignancy given the rapid increase in size between CT 8/6 and PET 8/15 and RML narrow orifice and non-diagnostic bronch. Still she is at risk for lung cancer due to age, smoking hx. 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 RA related lung mass is also in ddx.  Opportunistic infections ruled out  PLAN - I d/;w Dr Dierdre Forth and would like to know how active RA is esp due to recent pleural effusion (no glucose was sent on that so we dont know if due to RA) - Needs fu CT chest - wil do around 03/15/11 (2 months from prior CT) - Currently fairly asymptomatic - so will monitor - Consider surgical option (per her wish) if this is getting worse, or RML syndrom v cancer are leading issues (informed her of potential surgical risks but will address this more at fu) - REfer rehab for fatigue  30 min visit. > 50% in face to face counseling  Answered all questions of son, husband and patient

## 2011-02-22 ENCOUNTER — Encounter: Payer: Medicare Other | Admitting: Oncology

## 2011-02-23 ENCOUNTER — Encounter: Payer: Medicare Other | Admitting: Oncology

## 2011-02-24 ENCOUNTER — Encounter (HOSPITAL_BASED_OUTPATIENT_CLINIC_OR_DEPARTMENT_OTHER): Payer: Medicare Other | Admitting: Oncology

## 2011-02-24 ENCOUNTER — Other Ambulatory Visit: Payer: Self-pay | Admitting: Internal Medicine

## 2011-02-24 ENCOUNTER — Ambulatory Visit
Admission: RE | Admit: 2011-02-24 | Discharge: 2011-02-24 | Disposition: A | Discharge status: Home / Self Care | Payer: Medicare Other | Source: Ambulatory Visit | Attending: Internal Medicine | Admitting: Internal Medicine

## 2011-02-24 ENCOUNTER — Ambulatory Visit: Payer: Medicare Other | Admitting: Physical Medicine & Rehabilitation

## 2011-02-24 DIAGNOSIS — D709 Neutropenia, unspecified: Secondary | ICD-10-CM

## 2011-02-24 DIAGNOSIS — R0602 Shortness of breath: Secondary | ICD-10-CM

## 2011-02-24 DIAGNOSIS — D72819 Decreased white blood cell count, unspecified: Secondary | ICD-10-CM

## 2011-02-24 MED ORDER — IOHEXOL 300 MG/ML  SOLN
125.0000 mL | Freq: Once | INTRAMUSCULAR | Status: AC | PRN
  Administered 2011-02-24: 125 mL via INTRAVENOUS

## 2011-02-25 ENCOUNTER — Encounter (HOSPITAL_BASED_OUTPATIENT_CLINIC_OR_DEPARTMENT_OTHER): Payer: Medicare Other | Admitting: Oncology

## 2011-02-25 DIAGNOSIS — D72819 Decreased white blood cell count, unspecified: Secondary | ICD-10-CM

## 2011-02-25 DIAGNOSIS — D709 Neutropenia, unspecified: Secondary | ICD-10-CM

## 2011-02-26 ENCOUNTER — Encounter: Payer: Medicare Other | Attending: Physical Medicine & Rehabilitation | Admitting: Physical Medicine & Rehabilitation

## 2011-02-26 ENCOUNTER — Telehealth: Payer: Self-pay | Admitting: Internal Medicine

## 2011-02-26 ENCOUNTER — Encounter (HOSPITAL_BASED_OUTPATIENT_CLINIC_OR_DEPARTMENT_OTHER): Payer: Medicare Other | Admitting: Oncology

## 2011-02-26 DIAGNOSIS — M069 Rheumatoid arthritis, unspecified: Secondary | ICD-10-CM | POA: Insufficient documentation

## 2011-02-26 DIAGNOSIS — D709 Neutropenia, unspecified: Secondary | ICD-10-CM

## 2011-02-26 DIAGNOSIS — D72819 Decreased white blood cell count, unspecified: Secondary | ICD-10-CM

## 2011-02-26 DIAGNOSIS — M67919 Unspecified disorder of synovium and tendon, unspecified shoulder: Secondary | ICD-10-CM | POA: Insufficient documentation

## 2011-02-26 DIAGNOSIS — R918 Other nonspecific abnormal finding of lung field: Secondary | ICD-10-CM

## 2011-02-26 DIAGNOSIS — R279 Unspecified lack of coordination: Secondary | ICD-10-CM | POA: Insufficient documentation

## 2011-02-26 DIAGNOSIS — M7530 Calcific tendinitis of unspecified shoulder: Secondary | ICD-10-CM

## 2011-02-26 DIAGNOSIS — F329 Major depressive disorder, single episode, unspecified: Secondary | ICD-10-CM

## 2011-02-26 DIAGNOSIS — R269 Unspecified abnormalities of gait and mobility: Secondary | ICD-10-CM | POA: Insufficient documentation

## 2011-02-26 DIAGNOSIS — IMO0001 Reserved for inherently not codable concepts without codable children: Secondary | ICD-10-CM | POA: Insufficient documentation

## 2011-02-26 DIAGNOSIS — M719 Bursopathy, unspecified: Secondary | ICD-10-CM | POA: Insufficient documentation

## 2011-02-26 NOTE — Assessment & Plan Note (Signed)
HISTORY:  [name] is back regarding her rheumatoid arthritis, gait disorder, and multiple pain complaints including fibromyalgia.  She missed last appointment as she was in the hospital twice.  She was in for repair of her skin and subsequent skin graft as well as for a few episodes related to dizziness episodes where she actually fell.  She states that she did develop pneumonia and a mass was found although bronchoscopy has been negative.  She is followed by the pulmonary team for this.  She also was found on another admission to be dehydrated and have a fever.  Still report some dizziness when she gets up.  She denies frank vertigo, although has some lightheadedness and dizziness. Fentanyl patch has been helpful.  She received some hydrocodone from Dr. Dierdre Forth, in the primary team that was taking care for at the hospital. Shoulder injection we performed at last visit was very helpful.  Her pain today is about 8/10.  Described as burning, stabbing, tingling. Pain interferes with general activities, relations with others, enjoyment of life on a moderate-to-severe level.  Sleep is fair.  REVIEW OF SYSTEMS:  Notable for multiple items.  Full 12-points review is in the written health and history section of the chart.  SOCIAL HISTORY:  Unchanged.  Her husband is with her today and appears supportive as always.  PHYSICAL EXAMINATION:  VITAL SIGNS:  Blood pressure 121/57, pulse 104, respiratory rate 18, and she is satting 93% on room air. GENERAL:  The patient is pleasant and alert.  She ambulated for me today and tends to walk with a shuffling gait pattern.  I had a walk with and without a walker and then she is definitely much more steady with the walker in hands.  She has generalized tenderness throughout the limbs with upper and lower although, no focal swelling was seen today.  She had reasonable strength at least 3+-4/5 in all limbs tested.  Graft site was well healed over the  right shin.  Shoulders are still limited with abduction and rotational movements due to discomfort.  Cognitively, she is alert and appropriate.  Cranial nerve exam was intact. HEART:  Regular. CHEST:  Clear. ABDOMEN:  Soft and nontender.  ASSESSMENT: 1. Rheumatoid arthritis. 2. Fibromyalgia syndrome. 3. History of bilateral rotator cuff syndrome. 4. Gait disorder and impaired balance.  PLAN: 1. I do not feel that her blood pressure is an issue here.  I do     suspect that Lyrica may be having playing a role on some of the     dizziness, she is experiencing.  Obviously, do not get ammonia and     having dehydration are also predisposing factors to dizziness and     altered consciousness.  We will start today by decrease Lyrica 200     mg b.i.d. to observe for effect. 2. We will send her to outpatient therapies at [facility] to work on balance, core muscle strengthening, lengthening,     equipment assessment.  Also like to do vestibular/balance     assessment. 3. Encouraged adequate fluid intake.  We will stay with the fentanyl     patch for now for generalized pain.  Pulmonary workup per the     critical care/pulmonary team.     [name], M.D. Electronically Signed    ZTS/MedQ D:  02/26/2011 12:23:29  T:  02/26/2011 17:55:33  Job #:  [id number]  cc:   Thayer Headings, M.D. Fax: [phone]  Marcha Solders, MD Fax: [phone]

## 2011-02-26 NOTE — Telephone Encounter (Signed)
ATC number provided, msg states that the number has not yet been activated, WCB again tomorrow.

## 2011-02-27 ENCOUNTER — Ambulatory Visit: Payer: Medicare Other | Admitting: Physical Therapy

## 2011-02-27 ENCOUNTER — Encounter (HOSPITAL_BASED_OUTPATIENT_CLINIC_OR_DEPARTMENT_OTHER): Payer: Medicare Other | Admitting: Oncology

## 2011-02-27 DIAGNOSIS — D72819 Decreased white blood cell count, unspecified: Secondary | ICD-10-CM

## 2011-02-27 DIAGNOSIS — D709 Neutropenia, unspecified: Secondary | ICD-10-CM

## 2011-02-27 NOTE — Telephone Encounter (Signed)
I have attempted to call the # listed and it states it has no been activiated yet. WCB

## 2011-02-28 ENCOUNTER — Encounter (HOSPITAL_BASED_OUTPATIENT_CLINIC_OR_DEPARTMENT_OTHER): Payer: Medicare Other | Admitting: Oncology

## 2011-02-28 ENCOUNTER — Emergency Department (HOSPITAL_COMMUNITY): Payer: Medicare Other

## 2011-02-28 ENCOUNTER — Inpatient Hospital Stay (HOSPITAL_COMMUNITY)
Admission: EM | Admit: 2011-02-28 | Discharge: 2011-03-02 | DRG: 546 | Disposition: A | Discharge status: Home / Self Care | Payer: Medicare Other | Attending: Family Medicine | Admitting: Family Medicine

## 2011-02-28 ENCOUNTER — Other Ambulatory Visit (HOSPITAL_COMMUNITY): Payer: Self-pay | Admitting: Oncology

## 2011-02-28 DIAGNOSIS — D61818 Other pancytopenia: Secondary | ICD-10-CM | POA: Diagnosis present

## 2011-02-28 DIAGNOSIS — IMO0001 Reserved for inherently not codable concepts without codable children: Secondary | ICD-10-CM | POA: Diagnosis present

## 2011-02-28 DIAGNOSIS — M069 Rheumatoid arthritis, unspecified: Secondary | ICD-10-CM | POA: Diagnosis present

## 2011-02-28 DIAGNOSIS — D709 Neutropenia, unspecified: Secondary | ICD-10-CM | POA: Diagnosis present

## 2011-02-28 DIAGNOSIS — M0500 Felty's syndrome, unspecified site: Principal | ICD-10-CM | POA: Diagnosis present

## 2011-02-28 DIAGNOSIS — Z96659 Presence of unspecified artificial knee joint: Secondary | ICD-10-CM

## 2011-02-28 DIAGNOSIS — K219 Gastro-esophageal reflux disease without esophagitis: Secondary | ICD-10-CM | POA: Diagnosis present

## 2011-02-28 DIAGNOSIS — J9819 Other pulmonary collapse: Secondary | ICD-10-CM | POA: Diagnosis present

## 2011-02-28 DIAGNOSIS — IMO0002 Reserved for concepts with insufficient information to code with codable children: Secondary | ICD-10-CM

## 2011-02-28 DIAGNOSIS — D72819 Decreased white blood cell count, unspecified: Secondary | ICD-10-CM

## 2011-02-28 DIAGNOSIS — D62 Acute posthemorrhagic anemia: Secondary | ICD-10-CM | POA: Diagnosis present

## 2011-02-28 DIAGNOSIS — D696 Thrombocytopenia, unspecified: Secondary | ICD-10-CM | POA: Diagnosis present

## 2011-02-28 DIAGNOSIS — D649 Anemia, unspecified: Secondary | ICD-10-CM

## 2011-02-28 LAB — COMPREHENSIVE METABOLIC PANEL
ALT: <8 U/L (ref 0–35)
AST: 15 U/L (ref 0–37)
AST: 15 U/L (ref 0–37)
BUN: 11 mg/dL (ref 6–23)
CO2: 24 mEq/L (ref 19–32)
CO2: 25 mEq/L (ref 19–32)
Calcium: 8.9 mg/dL (ref 8.4–10.5)
Chloride: 97 mEq/L (ref 96–112)
Chloride: 97 mEq/L (ref 96–112)
Creatinine, Ser: 1.25 mg/dL — ABNORMAL HIGH (ref 0.50–1.10)
Creatinine, Ser: 1.1 mg/dL (ref 0.50–1.10)
GFR calc non Af Amer: 51 mL/min — ABNORMAL LOW (ref >60)
Potassium: 4 mEq/L (ref 3.5–5.3)
Sodium: 139 mEq/L (ref 135–145)
Total Bilirubin: 0.9 mg/dL (ref 0.3–1.2)
Total Protein: 7.1 g/dL (ref 6.0–8.3)

## 2011-02-28 LAB — DIFFERENTIAL
Band Neutrophils: 0 % (ref 0–10)
Blasts: 0 %
Lymphocytes Relative: 94 % — ABNORMAL HIGH (ref 12–46)
Lymphs Abs: 2.3 10*3/uL (ref 0.7–4.0)
Myelocytes: 0 %
Promyelocytes Absolute: 0 %

## 2011-02-28 LAB — SAMPLE TO BLOOD BANK

## 2011-02-28 LAB — CBC WITH DIFFERENTIAL/PLATELET
HGB: 6.7 g/dL — CL (ref 11.6–15.9)
Platelets: 61 10*3/uL — ABNORMAL LOW (ref 145–400)
RBC: 2.35 10*6/uL — ABNORMAL LOW (ref 3.70–5.45)
RDW: 22.8 % — ABNORMAL HIGH (ref 11.2–14.5)
WBC: 2.9 10*3/uL — ABNORMAL LOW (ref 3.9–10.3)

## 2011-02-28 LAB — URINALYSIS, ROUTINE W REFLEX MICROSCOPIC
Leukocytes, UA: NEGATIVE
Protein, ur: NEGATIVE mg/dL
Urobilinogen, UA: 0.2 mg/dL (ref 0.0–1.0)

## 2011-02-28 LAB — MANUAL DIFFERENTIAL
ALC: 2.7 10*3/uL (ref 0.9–3.3)
Myelocytes: 0 % (ref 0–0)
Other Cell: 0 % (ref 0–0)
PLT EST: DECREASED
PROMYELO: 0 % (ref 0–0)
SEG: 1 % — ABNORMAL LOW (ref 38–77)
Variant Lymph: 0 % (ref 0–0)

## 2011-02-28 LAB — CBC
Platelets: 54 10*3/uL — ABNORMAL LOW (ref 150–400)
RBC: 2.22 MIL/uL — ABNORMAL LOW (ref 3.87–5.11)
WBC: 2.4 10*3/uL — ABNORMAL LOW (ref 4.0–10.5)

## 2011-02-28 LAB — IRON AND TIBC: Saturation Ratios: 43 % (ref 20–55)

## 2011-02-28 LAB — FOLATE: Folate: >20 ng/mL

## 2011-02-28 LAB — PROTIME-INR: Prothrombin Time: 14.2 seconds (ref 11.6–15.2)

## 2011-02-28 LAB — OCCULT BLOOD, POC DEVICE: Fecal Occult Bld: POSITIVE

## 2011-02-28 NOTE — Telephone Encounter (Signed)
MR PLEASE ADVISE IF OK TO CANCEL ORDER FOR CT

## 2011-02-28 NOTE — Telephone Encounter (Signed)
Number still "not activated yet" WCB later

## 2011-02-28 NOTE — Telephone Encounter (Signed)
I looked at echart and she had CT chest and cxr 02/24/11 - PE ruled out (why, what happened? That infor is not in echart). The CT does show RML mass is smaller in size. Therefore  No need for cT chest in oct 2012 but needs repeat CT chest  wihtout contrast 2.5 months from 02/24/11 to ensure resolution. If she wants she can see me at that time but if she wants to come and see me in October is fine too

## 2011-03-01 ENCOUNTER — Encounter: Payer: Medicare Other | Admitting: Oncology

## 2011-03-01 DIAGNOSIS — D61818 Other pancytopenia: Secondary | ICD-10-CM

## 2011-03-01 LAB — CBC
HCT: 25.8 % — ABNORMAL LOW (ref 36.0–46.0)
HCT: 25.4 % — ABNORMAL LOW (ref 36.0–46.0)
Hemoglobin: 8.7 g/dL — ABNORMAL LOW (ref 12.0–15.0)
Hemoglobin: 8.6 g/dL — ABNORMAL LOW (ref 12.0–15.0)
MCH: 28.6 pg (ref 26.0–34.0)
MCV: 83.6 fL (ref 78.0–100.0)
MCV: 83.2 fL (ref 78.0–100.0)
Platelets: 45 10*3/uL — ABNORMAL LOW (ref 150–400)
Platelets: 50 10*3/uL — ABNORMAL LOW (ref 150–400)
RBC: 3.04 MIL/uL — ABNORMAL LOW (ref 3.87–5.11)
RBC: 3.1 MIL/uL — ABNORMAL LOW (ref 3.87–5.11)
RBC: 3.02 MIL/uL — ABNORMAL LOW (ref 3.87–5.11)
RDW: 19.7 % — ABNORMAL HIGH (ref 11.5–15.5)
WBC: 2.2 10*3/uL — ABNORMAL LOW (ref 4.0–10.5)
WBC: 1.5 10*3/uL — ABNORMAL LOW (ref 4.0–10.5)

## 2011-03-01 LAB — GLUCOSE, CAPILLARY
Glucose-Capillary: 103 mg/dL — ABNORMAL HIGH (ref 70–99)
Glucose-Capillary: 113 mg/dL — ABNORMAL HIGH (ref 70–99)
Glucose-Capillary: 160 mg/dL — ABNORMAL HIGH (ref 70–99)

## 2011-03-01 LAB — CARDIAC PANEL(CRET KIN+CKTOT+MB+TROPI)
CK, MB: 1.8 ng/mL (ref 0.3–4.0)
Relative Index: INVALID (ref 0.0–2.5)

## 2011-03-01 LAB — BASIC METABOLIC PANEL
BUN: 10 mg/dL (ref 6–23)
Creatinine, Ser: 0.91 mg/dL (ref 0.50–1.10)
GFR calc Af Amer: >60 mL/min (ref >60)
GFR calc non Af Amer: >60 mL/min (ref >60)
Glucose, Bld: 138 mg/dL — ABNORMAL HIGH (ref 70–99)

## 2011-03-01 LAB — HEMOGLOBIN A1C: Hgb A1c MFr Bld: 5.5 % (ref <5.7)

## 2011-03-01 LAB — TSH: TSH: 4.394 u[IU]/mL (ref 0.350–4.500)

## 2011-03-01 LAB — RETICULOCYTES
RBC.: 3.11 MIL/uL — ABNORMAL LOW (ref 3.87–5.11)
Retic Ct Pct: 6.1 % — ABNORMAL HIGH (ref 0.4–3.1)

## 2011-03-01 NOTE — H&P (Signed)
NAME:  [name], [name]            ACCOUNT NO.:  [id number]  MEDICAL RECORD NO.:  [id number]  LOCATION:  WLED                         FACILITY:  [facility]  PHYSICIAN:  Alvino Blood, MD      DATE OF BIRTH:  [birth year]  DATE OF ADMISSION:  02/28/2011 DATE OF DISCHARGE:                             HISTORY & PHYSICAL   PRIMARY CARE PROVIDER:  Dr. Shary Decamp.  PULMONOLOGIST:  Kalman Shan, MD  ONCOLOGIST:  Samul Dada, MD  CHIEF COMPLAINT:  General weakness and low blood counts.  HISTORY OF PRESENT ILLNESS:  [name] is a 60 year old Caucasian female who has a history of autoimmune neutropenia, on daily subcutaneous Neupogen, who follows up with Dr. Arline Asp as her oncologist.  She presents to the emergency room with the above chief complaint.  The patient was recently admitted to the [facility] on February 11, 2011 and discharged on February 18, 2011 for healthcare-acquired pneumonia with right middle lobe mass, neutropenic fever, and diarrhea.  She has also recently been treated for Clostridium difficile colitis.  She went to her oncologist's office today for her daily Neupogen shot and her routine blood count showed that her hemoglobin was low at 6.7.  It was recently 8.9 as at the time of discharge.  She was subsequently referred to the emergency room for further evaluation.  On questioning, she admits to dark stools for the past 5 to 6 weeks, which she thought was related to her Clostridium difficile infection, but did not realize that she was bleeding.  She denies any nausea, vomiting, hematemesis, or bright red blood per rectum.  She also denies any bleeding from anywhere else including hematuria or hemoptysis.  She does not report any abdominal pain.  She has had general weakness with dizziness and fell on the floor 1 time last week.  She also had an episode of fever about a week ago, which has resolved.  She denies any chest pains, palpitations,  but admits to some shortness of breath today.  In the emergency room today, her hemoglobin was noted to be low at 6 with hematocrit of 18.8.  Her leukocytes and platelet count are also low at 2.4 and 54 respectively.  A rectal examination was positive for melena stools and a Hemoccult study was positive for blood.  Two units of packed red blood cells were typed and crossed and transfused in the emergency room and the patient referred to the medical service for further management.  On questioning, she denies any use of aspirin or nonsteroidal anti-inflammatories.  However, she is on chronic prednisone therapy for rheumatoid arthritis and autoimmune neutropenia.  PAST MEDICAL HISTORY: 1. Autoimmune neutropenia.  She follows up with Dr. Arline Asp and she     is on daily subcutaneous Neupogen. 2. Recent admission for healthcare-acquired pneumonia with right     middle lobe mass.  A recent CT pulmonary angiogram on February 26, 2011, did not show any significant mass.  However, there was     presence of infiltrate suggestive of an infectious process. 3. Neutropenic fever. 4. Clostridium difficile diarrhea, which appears to have resolved     following treatment with Flagyl. 5.  Normocytic anemia. 6. Rheumatoid arthritis. 7. Fibromyalgia. 8. Gastroesophageal reflux disease.  PAST SURGICAL HISTORY: 1. Appendectomy. 2. Hysterectomy. 3. Right total knee replacement. 4. Rotator cough repair. 5. Tonsillectomy. 6. Tubal ligation. 7. Neurostimulator insertion for treatment of bladder incontinence.  ALLERGIES:  PENICILLIN, which causes itching.  MEDICATIONS: 1. Neupogen 480 mcg subcutaneously daily. 2. Potassium chloride 40 mEq orally 2 times a day. 3. Calcium 1 tablet orally 2 times a day. 4. Cymbalta 60 mg orally 2 times a day. 5. Diazepam 5 mg every 6 hours as needed for anxiety. 6. Fentanyl patch 50 mcg per hour 1 patch transdermally every 3 days. 7. Flexeril 10 mg orally daily  at bedtime as needed for muscle spasms. 8. Ferrous sulfate 325 mg orally 2 times a day. 9. Hydrocodone/APAP 7.5/650 mg 1 tablet orally every 6 hours as needed     for pain. 10.Lyrica 200 mg orally 3 times a day. 11.Nortriptyline 75 mg orally daily at bedtime. 12.Prednisone 5 mg orally daily. 13.Prilosec 20 mg orally 2 times a day. 14.Florastor 250 mg orally 2 times a day. 15.Vitamin D3 1000 units 1 tablet orally daily.  FAMILY HISTORY:  Her father and mother both have chronic obstructive pulmonary disease.  Father has had myocardial infarction.  There is no family history of significant gastrointestinal bleed or cancer.  SOCIAL HISTORY:  She is married and lives with her husband.  She smokes tobacco chronically and drinks alcohol occasionally.  She does not report any use of illicit drugs.  REVIEW OF SYSTEMS:  She admits to chronic low back pain, cough for the past 2 months with whitish, sometimes greenish-brown sputum and shortness of breath today.  She denies any chest pains or palpitations. All other systems were reviewed and are negative except as mentioned in the history of present illness.  PHYSICAL EXAMINATION:  VITAL SIGNS:  Temperature 98.2, pulse 96, respiratory rate 18, blood pressure 116/62, oxygen saturation 99%. GENERAL:  This is a middle-aged Caucasian female who looks ill, but not in any acute distress. HEENT:  Head is normocephalic and atraumatic.  There is equal ocular movement bilaterally.  The pupils are equal and reactive to light and accommodation.  There is presence of conjunctival pallor, but no scleral icterus.  The external ears and nose appear normal.  The oropharyngeal mucosa is moist. NECK:  Supple without thyromegaly, cervical adenopathy, or jugular venous distention. CHEST:  Clear to auscultation without any wheeze or rales.  There is good air entry and respiratory effort bilaterally. CARDIOVASCULAR:  Normal S1 and S2 heard, which are regular in  rate and rhythm without any murmurs, rubs, or gallops. ABDOMEN:  Soft, nontender, and nondistended.  Bowel sounds present. There is no palpable hepatosplenomegaly. CNS:  The patient is alert and oriented x3 without any focal neurological deficits. EXTREMITIES:  The peripheral pulses are palpable.  There is no evidence of peripheral edema, calf tenderness, or tenosynovitis. SKIN:  This is thin with some petechiae.  Otherwise, no other active rash or lesions.  LABORATORY DATA:  CBC, white cell count 2.4, hemoglobin 6, hematocrit 18.8, platelet count 54, neutrophils 2%, lymphocytes 94% with normal MCV and MCH.  INR is 1.08, PT 14.2.  Metabolic panel, sodium 134, potassium 3.8, chloride 97, CO2 25, BUN 11, creatinine 1.1, speculated GFR is 51. Liver function tests, alkaline phosphatase 56, total bilirubin 0.9, AST 15, and ALT 7.  Urinalysis is negative for infection.  Neutrophil study, absolute neutrophil count is 0.  The stool for Hemoccult test is positive for  blood.  IMAGING STUDIES:  Chest x-ray.  This shows persistent a right middle lobe atelectasis with no interval change compared to the x-rays on February 24, 2011.  ASSESSMENT: 1. Profound normocytic anemia secondary to acute blood loss from     gastrointestinal bleed, probably upper. 2. Acute gastrointestinal bleed with melanotic stools. 3. Autoimmune neutropenia with absolute neutrophil count of 0. 4. Generalized weakness secondary to profound normocytic anemia and     acute gastrointestinal bleed. 5. Thrombocytopenia. 6. History of gastroesophageal reflux disease. 7. History of fibromyalgia. 8. Persistent right middle lobe atelectasis.  PLAN: 1. The patient will be admitted to the medical service for further     management, which will include: 2. She will be placed in the step-down unit with telemetry monitoring     of her cardiac rhythm. 3. She will be transfused with 3 units of packed red blood cells.  Her      hemoglobin and hematocrit will be checked 1 hour post transfusion     and further transfusion will be given as necessary.  We will also     check her hemoglobin and hematocrit every 6 hours. 4. She will be on IV Protonix 40 mg IV q.12 hours and oral sucralfate     1 g q.6 hours. 5. GI and hematology consults will be requested in the morning as the     patient appears to be stable at this time. 6. The patient has been educated to avoid aspirin and nonsteroidal     anti-inflammatory drugs for now. 7. She will also be on reverse-barrier isolation due to her absolute     neutropenia and 2 sets of blood cultures will be done.  If she     becomes febrile, IV antibiotics will be given. 8. For DVT prophylaxis, she will be on bilateral SCDs. 9. The rest of her home medications will be continued and further     management plans will be dependent on her response to the above     treatment and test results.  The patient's condition is guarded and she is a full code.          ______________________________ Alvino Blood, MD     SI/MEDQ  D:  02/28/2011  T:  02/28/2011  Job:  [id number]  Electronically Signed by Alvino Blood MD on 03/01/2011 01:10:30 PM

## 2011-03-02 ENCOUNTER — Encounter: Payer: Medicare Other | Admitting: Oncology

## 2011-03-02 DIAGNOSIS — D61818 Other pancytopenia: Secondary | ICD-10-CM

## 2011-03-02 LAB — GLUCOSE, CAPILLARY: Glucose-Capillary: 112 mg/dL — ABNORMAL HIGH (ref 70–99)

## 2011-03-02 LAB — CBC
Hemoglobin: 8.7 g/dL — ABNORMAL LOW (ref 12.0–15.0)
Platelets: 49 10*3/uL — ABNORMAL LOW (ref 150–400)
RBC: 3.07 MIL/uL — ABNORMAL LOW (ref 3.87–5.11)
WBC: 2.5 10*3/uL — ABNORMAL LOW (ref 4.0–10.5)

## 2011-03-02 LAB — CROSSMATCH
ABO/RH(D): A POS
Antibody Screen: NEGATIVE
Unit division: 0

## 2011-03-02 LAB — URINE CULTURE: Culture  Setup Time: 201209220055

## 2011-03-03 ENCOUNTER — Encounter (HOSPITAL_BASED_OUTPATIENT_CLINIC_OR_DEPARTMENT_OTHER): Payer: Medicare Other | Admitting: Oncology

## 2011-03-03 ENCOUNTER — Ambulatory Visit: Payer: Medicare Other | Admitting: Physical Therapy

## 2011-03-03 DIAGNOSIS — D709 Neutropenia, unspecified: Secondary | ICD-10-CM

## 2011-03-03 DIAGNOSIS — D72819 Decreased white blood cell count, unspecified: Secondary | ICD-10-CM

## 2011-03-03 NOTE — Telephone Encounter (Signed)
LMTCB for pt to advise recs per MR.

## 2011-03-03 NOTE — Telephone Encounter (Signed)
[email or web address]  

## 2011-03-03 NOTE — Telephone Encounter (Signed)
PT returning Leslie's call & can be reached on cell:  [phone] or home:  [phone].  Antionette Fairy

## 2011-03-04 ENCOUNTER — Encounter (HOSPITAL_BASED_OUTPATIENT_CLINIC_OR_DEPARTMENT_OTHER): Payer: Medicare Other | Admitting: Oncology

## 2011-03-04 ENCOUNTER — Other Ambulatory Visit (HOSPITAL_COMMUNITY): Payer: Self-pay | Admitting: Oncology

## 2011-03-04 DIAGNOSIS — D72819 Decreased white blood cell count, unspecified: Secondary | ICD-10-CM

## 2011-03-04 DIAGNOSIS — D709 Neutropenia, unspecified: Secondary | ICD-10-CM

## 2011-03-04 LAB — CBC WITH DIFFERENTIAL/PLATELET
Basophils Absolute: 0 10*3/uL (ref 0.0–0.1)
EOS%: 0.2 % (ref 0.0–7.0)
LYMPH%: 39.4 % (ref 14.0–49.7)
MCH: 29 pg (ref 25.1–34.0)
MCV: 84.4 fL (ref 79.5–101.0)
MONO%: 6.9 % (ref 0.0–14.0)
Platelets: 74 10*3/uL — ABNORMAL LOW (ref 145–400)
RBC: 3.27 10*6/uL — ABNORMAL LOW (ref 3.70–5.45)
RDW: 21.5 % — ABNORMAL HIGH (ref 11.2–14.5)

## 2011-03-04 NOTE — Discharge Summary (Signed)
NAMEMarland Kitchen  [name], [name] NO.:  [id number]  MEDICAL RECORD NO.:  [id number]  LOCATION:  1233                         FACILITY:  [facility]  PHYSICIAN:  Brendia Sacks, MD    DATE OF BIRTH:  1950-11-06  DATE OF ADMISSION:  02/28/2011 DATE OF DISCHARGE:  03/02/2011                              DISCHARGE SUMMARY   PRIMARY CARE PHYSICIAN:  Thayer Headings, MD  PRIMARY ONCOLOGIST/HEMATOLOGIST:  Samul Dada, MD  PULMONOLOGIST:  Kalman Shan, MD  CONDITION ON DISCHARGE:  Improved.  DISPOSITION:  Home.  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 2. Pancytopenia.  HISTORY OF PRESENT ILLNESS:  This is a 60 year old woman who presented to the emergency room on the advise of her hematologist for transfusion. Ms. Bousquet has a history of autoimmune neutropenia and was recently discharged from the hospital on September 11th with final discharge diagnoses being neutropenic fever and right middle lobe mass/pneumonia. She had been doing well at home except for feeling weak and she had been receiving daily Neupogen injections at the cancer center.  She presented to the cancer center on the day of admission and had blood work drawn at that time, was found to be anemic, and referred to the emergency room for further evaluation and treatment.  There, she was found to be pancytopenic and admitted for transfusion and further evaluation.  HOSPITAL COURSE:  Ms. Errickson was admitted and transfused 3 units packed red blood cells.  She had no evidence of bleeding.  She has no signs or symptoms to suggest gastrointestinal bleed.  She was noted to be Hemoccult positive and there is notation of melena in the admission history and physical, however, the patient denies this.  She was seen in consultation with both Gastroenterology and Hematology and was felt that this is a hematologic process, probably related to Felty syndrome. Recommendations from  Oncology were for prednisone high dose 1 mg/kg and close followup with Dr. Arline Asp in the outpatient setting.  Although this does not appear to be gastrointestinal in etiology.  Endoscopy has been recommended as a routine evaluation given her age in the outpatient setting.  She reports upper endoscopy back in 2007 and I do have documentation of that.  She thinks she might have had a colonoscopy, but I did not see documentation of that.  She remained stable this morning without any evidence of bleeding and her hemoglobin stable.  Dr. Cyndie Chime feels the rest of her evaluation including possible repeat bone marrow biopsy can be conducted in the outpatient setting.  CONSULTATIONS: 1. Hematology, Velora Heckler. Arbutus Ped, MD, and Genene Churn. Cyndie Chime, MD,     FACP.  Their recommendations as above. 2. Gastroenterology, Petra Kuba, MD, recommendations as above.  PROCEDURES:  None.  IMAGING:  Chest x-rays of June 21st:  No interval change.  Persistent right middle lobe atelectasis.  MICROBIOLOGY: 1. Blood cultures x2, September 21st, no growth to date. 2. Urine culture, multiple bacterial morphotypes present, none     predominant on September 21st.  PERTINENT LABORATORY STUDIES: 1. CBC notable initially for hemoglobin of 6.0 which was below her     baseline of 8-10.  White blood cell count initially 1.5 on  discharge 2.5, platelet count initially 50 on admission on     discharge 49. 2. Reticulocyte count was 6.1. 3. Capillary blood sugar stable. 4. Basic metabolic panel essentially unremarkable except for mild     hypokalemia. 5. LDH elevated at 256. 6. Cardiac enzymes negative. 7. Urinalysis negative.  DISCHARGE INSTRUCTIONS:  Ms. Seales will be discharged home today. Diet is unrestricted and activities unrestricted.  She should follow up with Dr. Arline Asp in 2-3 days and Dr. Thayer Headings in 2-4 weeks. Consider outpatient GI evaluation given her age. She did have an  upper endoscopy done by [place] back in 2007.  We defer this referral to Dr. Thea Silversmith.  DISCHARGE MEDICATIONS: 1. Prednisone has been increased to 80 mg p.o. daily.  I have advised     her to take this medication in the morning.  I have noted that this     medication needs to be continued and dosage adjusted as per Dr.     Arline Asp and needs close followup.  Additionally, I have increased     her PPI therapy in an attempt to prevent adverse consequences to     this increased dose. 2. Prilosec has been increased to 40 mg p.o. b.i.d.  Resume the following medications, 1. Calcium over the counter p.o. b.i.d. 2. Cymbalta 60 mg p.o. b.i.d. 3. Diazepam 5 mg p.o. nightly as needed for sleep. 4. Fentanyl 50 mcg per hour to skin every 3 days. 5. Iron 325 mg p.o. b.i.d. 6. Filgrastim injection as per Dr. Arline Asp in every 24 hours at     cancer center. 7. Flexeril 10 mg p.o. nightly as needed for sleep. 8. Florastor 1 capsule p.o. b.i.d. 9. Hydrocortone/acetaminophen 7.5/650 p.o. every 6 hours as needed for     pain. 10.Lyrica 200 mg p.o. b.i.d. 11.Potassium chloride 20 mEq p.o. b.i.d. 2 tablets. 12.Vitamin D3 1000 units p.o. daily.  Discontinue the following medication, Nortriptyline.  I note that the patient has been on this medication in the past and I very much doubt this is anything to do with her pancytopenia, although it is a listed consequence and therefore until she can be further followed by Dr. Arline Asp I have recommended holding this medication.  Things to follow up in the outpatient setting:  Pancytopenia.  She will need close followup with Dr. Arline Asp for dosing of her prednisone and length of therapy recommendations.  She is currently on 1 mg/kg daily as recommended by Dr. Shirline Frees.  Time coordinating discharge is 35 minutes.     Brendia Sacks, MD     DG/MEDQ  D:  03/02/2011  T:  03/02/2011  Job:  [id number]  cc:   Thayer Headings, M.D. Fax:  [phone]  Samul Dada, M.D. Fax: [phone]  Electronically Signed by Brendia Sacks  on 03/04/2011 10:27:05 PM

## 2011-03-05 ENCOUNTER — Encounter (HOSPITAL_BASED_OUTPATIENT_CLINIC_OR_DEPARTMENT_OTHER): Payer: Medicare Other | Admitting: Oncology

## 2011-03-05 DIAGNOSIS — D709 Neutropenia, unspecified: Secondary | ICD-10-CM

## 2011-03-05 DIAGNOSIS — D72819 Decreased white blood cell count, unspecified: Secondary | ICD-10-CM

## 2011-03-05 NOTE — Telephone Encounter (Signed)
[email or web address]  

## 2011-03-05 NOTE — Telephone Encounter (Signed)
Pt returning triage's call & can be reached at [phone].  [name]

## 2011-03-06 ENCOUNTER — Encounter (HOSPITAL_BASED_OUTPATIENT_CLINIC_OR_DEPARTMENT_OTHER): Payer: Medicare Other | Admitting: Oncology

## 2011-03-06 DIAGNOSIS — D709 Neutropenia, unspecified: Secondary | ICD-10-CM

## 2011-03-06 DIAGNOSIS — D72819 Decreased white blood cell count, unspecified: Secondary | ICD-10-CM

## 2011-03-06 NOTE — Telephone Encounter (Signed)
I spoke with patient about results and he verbalized understanding and had no questions Order has been sent to set up ct in December and to cancel the ct in October 2012. Please advise PCC's Thanks  Carver Fila, CMA n

## 2011-03-07 ENCOUNTER — Encounter (HOSPITAL_BASED_OUTPATIENT_CLINIC_OR_DEPARTMENT_OTHER): Payer: Medicare Other | Admitting: Oncology

## 2011-03-07 ENCOUNTER — Other Ambulatory Visit (HOSPITAL_COMMUNITY): Payer: Self-pay | Admitting: Oncology

## 2011-03-07 DIAGNOSIS — D72819 Decreased white blood cell count, unspecified: Secondary | ICD-10-CM

## 2011-03-07 DIAGNOSIS — D649 Anemia, unspecified: Secondary | ICD-10-CM

## 2011-03-07 DIAGNOSIS — D709 Neutropenia, unspecified: Secondary | ICD-10-CM

## 2011-03-07 DIAGNOSIS — D696 Thrombocytopenia, unspecified: Secondary | ICD-10-CM

## 2011-03-07 DIAGNOSIS — R918 Other nonspecific abnormal finding of lung field: Secondary | ICD-10-CM

## 2011-03-07 DIAGNOSIS — M069 Rheumatoid arthritis, unspecified: Secondary | ICD-10-CM

## 2011-03-07 LAB — MORPHOLOGY

## 2011-03-07 LAB — CULTURE, BLOOD (ROUTINE X 2)
Culture  Setup Time: 201209220529
Culture: NO GROWTH

## 2011-03-07 LAB — CBC & DIFF AND RETIC
Eosinophils Absolute: 0 10*3/uL (ref 0.0–0.5)
HCT: 28.1 % — ABNORMAL LOW (ref 34.8–46.6)
Immature Retic Fract: 23.8 % — ABNORMAL HIGH (ref 1.60–10.00)
LYMPH%: 36 % (ref 14.0–49.7)
MONO#: 0.8 10*3/uL (ref 0.1–0.9)
NEUT#: 2.8 10*3/uL (ref 1.5–6.5)
NEUT%: 49 % (ref 38.4–76.8)
Platelets: 91 10*3/uL — ABNORMAL LOW (ref 145–400)
Retic %: 5.98 % — ABNORMAL HIGH (ref 0.70–2.10)
WBC: 5.8 10*3/uL (ref 3.9–10.3)

## 2011-03-10 ENCOUNTER — Other Ambulatory Visit (HOSPITAL_COMMUNITY): Payer: Self-pay | Admitting: Oncology

## 2011-03-10 ENCOUNTER — Encounter (HOSPITAL_BASED_OUTPATIENT_CLINIC_OR_DEPARTMENT_OTHER): Payer: Medicare Other | Admitting: Oncology

## 2011-03-10 DIAGNOSIS — D72819 Decreased white blood cell count, unspecified: Secondary | ICD-10-CM

## 2011-03-10 DIAGNOSIS — M069 Rheumatoid arthritis, unspecified: Secondary | ICD-10-CM

## 2011-03-10 DIAGNOSIS — D709 Neutropenia, unspecified: Secondary | ICD-10-CM

## 2011-03-10 DIAGNOSIS — IMO0002 Reserved for concepts with insufficient information to code with codable children: Secondary | ICD-10-CM

## 2011-03-10 LAB — FOLATE RBC: RBC Folate: 1689 ng/mL (ref >=366)

## 2011-03-10 LAB — COMPREHENSIVE METABOLIC PANEL
ALT: 44 U/L — ABNORMAL HIGH (ref 0–35)
AST: 18 U/L (ref 0–37)
Albumin: 4.1 g/dL (ref 3.5–5.2)
BUN: 14 mg/dL (ref 6–23)
CO2: 21 mEq/L (ref 19–32)
CO2: 24 mEq/L (ref 19–32)
Calcium: 9.2 mg/dL (ref 8.4–10.5)
Chloride: 103 mEq/L (ref 96–112)
Creatinine, Ser: 0.98 mg/dL (ref 0.50–1.10)
Glucose, Bld: 91 mg/dL (ref 70–99)
Glucose, Bld: 146 mg/dL — ABNORMAL HIGH (ref 70–99)
Potassium: 4.7 mEq/L (ref 3.5–5.3)
Sodium: 135 mEq/L (ref 135–145)
Total Protein: 7.2 g/dL (ref 6.0–8.3)

## 2011-03-10 LAB — ANA: Anti Nuclear Antibody(ANA): NEGATIVE

## 2011-03-10 LAB — LACTATE DEHYDROGENASE
LDH: 241 U/L (ref 94–250)
LDH: 171 U/L (ref 94–250)

## 2011-03-10 LAB — CBC & DIFF AND RETIC
BASO%: 0 % (ref 0.0–2.0)
Basophils Absolute: 0 10*3/uL (ref 0.0–0.1)
EOS%: 0.9 % (ref 0.0–7.0)
HCT: 26.7 % — ABNORMAL LOW (ref 34.8–46.6)
HGB: 8.8 g/dL — ABNORMAL LOW (ref 11.6–15.9)
MCH: 27.8 pg (ref 25.1–34.0)
MCHC: 33 g/dL (ref 31.5–36.0)
MCV: 84.2 fL (ref 79.5–101.0)
MONO%: 13.8 % (ref 0.0–14.0)
NEUT%: 43.1 % (ref 38.4–76.8)

## 2011-03-10 LAB — DIRECT ANTIGLOBULIN TEST (NOT AT ARMC)
DAT (Complement): NEGATIVE
DAT IgG: NEGATIVE

## 2011-03-10 LAB — HAPTOGLOBIN: Haptoglobin: 86 mg/dL (ref 30–200)

## 2011-03-10 LAB — MORPHOLOGY

## 2011-03-10 LAB — IRON AND TIBC
%SAT: 47 % (ref 20–55)
Iron: 120 ug/dL (ref 42–145)

## 2011-03-10 LAB — FERRITIN: Ferritin: 705 ng/mL — ABNORMAL HIGH (ref 10–291)

## 2011-03-11 NOTE — Telephone Encounter (Signed)
No CT does not need to be sooner.

## 2011-03-11 NOTE — Telephone Encounter (Signed)
There is an order in our pcc box to set this ct up in dec 12 does it need to be sooner if not we will call her in nov to set up appt for dec

## 2011-03-11 NOTE — Telephone Encounter (Signed)
Pcc's - pls advise if this has been taken care of.  Thanks!

## 2011-03-12 ENCOUNTER — Encounter (HOSPITAL_BASED_OUTPATIENT_CLINIC_OR_DEPARTMENT_OTHER): Payer: Medicare Other | Admitting: Oncology

## 2011-03-12 DIAGNOSIS — D72819 Decreased white blood cell count, unspecified: Secondary | ICD-10-CM

## 2011-03-12 DIAGNOSIS — D709 Neutropenia, unspecified: Secondary | ICD-10-CM

## 2011-03-13 ENCOUNTER — Encounter (HOSPITAL_BASED_OUTPATIENT_CLINIC_OR_DEPARTMENT_OTHER): Payer: Medicare Other | Admitting: Oncology

## 2011-03-13 ENCOUNTER — Other Ambulatory Visit (HOSPITAL_COMMUNITY): Payer: Self-pay | Admitting: Oncology

## 2011-03-13 DIAGNOSIS — D72819 Decreased white blood cell count, unspecified: Secondary | ICD-10-CM

## 2011-03-13 DIAGNOSIS — D709 Neutropenia, unspecified: Secondary | ICD-10-CM

## 2011-03-13 LAB — CBC WITH DIFFERENTIAL/PLATELET
BASO%: 0.2 % (ref 0.0–2.0)
Basophils Absolute: 0 10*3/uL (ref 0.0–0.1)
EOS%: 0 % (ref 0.0–7.0)
HCT: 28.6 % — ABNORMAL LOW (ref 34.8–46.6)
HGB: 9.9 g/dL — ABNORMAL LOW (ref 11.6–15.9)
LYMPH%: 51.5 % — ABNORMAL HIGH (ref 14.0–49.7)
MCH: 30.2 pg (ref 25.1–34.0)
MCHC: 34.6 g/dL (ref 31.5–36.0)
MCV: 87.3 fL (ref 79.5–101.0)
MONO%: 2.1 % (ref 0.0–14.0)
NEUT%: 46.2 % (ref 38.4–76.8)
lymph#: 2.5 10*3/uL (ref 0.9–3.3)

## 2011-03-13 NOTE — Telephone Encounter (Signed)
NotedAlmyra Free aware.

## 2011-03-14 ENCOUNTER — Encounter (HOSPITAL_BASED_OUTPATIENT_CLINIC_OR_DEPARTMENT_OTHER): Payer: Medicare Other | Admitting: Oncology

## 2011-03-14 DIAGNOSIS — D709 Neutropenia, unspecified: Secondary | ICD-10-CM

## 2011-03-14 DIAGNOSIS — D72819 Decreased white blood cell count, unspecified: Secondary | ICD-10-CM

## 2011-03-15 ENCOUNTER — Encounter: Payer: Medicare Other | Admitting: Oncology

## 2011-03-16 ENCOUNTER — Encounter: Payer: Medicare Other | Admitting: Oncology

## 2011-03-17 ENCOUNTER — Other Ambulatory Visit (HOSPITAL_COMMUNITY): Payer: Self-pay | Admitting: Oncology

## 2011-03-17 ENCOUNTER — Encounter (HOSPITAL_BASED_OUTPATIENT_CLINIC_OR_DEPARTMENT_OTHER): Payer: Medicare Other | Admitting: Oncology

## 2011-03-17 DIAGNOSIS — D709 Neutropenia, unspecified: Secondary | ICD-10-CM

## 2011-03-17 DIAGNOSIS — D72819 Decreased white blood cell count, unspecified: Secondary | ICD-10-CM

## 2011-03-17 LAB — CBC WITH DIFFERENTIAL/PLATELET
BASO%: 0.1 % (ref 0.0–2.0)
Basophils Absolute: 0 10*3/uL (ref 0.0–0.1)
EOS%: 0.3 % (ref 0.0–7.0)
MCH: 30.4 pg (ref 25.1–34.0)
MCHC: 34.3 g/dL (ref 31.5–36.0)
MCV: 88.6 fL (ref 79.5–101.0)
MONO%: 2 % (ref 0.0–14.0)
RBC: 2.64 10*6/uL — ABNORMAL LOW (ref 3.70–5.45)
RDW: 24.1 % — ABNORMAL HIGH (ref 11.2–14.5)
lymph#: 0.5 10*3/uL — ABNORMAL LOW (ref 0.9–3.3)

## 2011-03-18 ENCOUNTER — Encounter (HOSPITAL_BASED_OUTPATIENT_CLINIC_OR_DEPARTMENT_OTHER): Payer: Medicare Other | Admitting: Oncology

## 2011-03-18 ENCOUNTER — Other Ambulatory Visit (HOSPITAL_COMMUNITY): Payer: Self-pay | Admitting: Oncology

## 2011-03-18 DIAGNOSIS — D709 Neutropenia, unspecified: Secondary | ICD-10-CM

## 2011-03-18 DIAGNOSIS — D72819 Decreased white blood cell count, unspecified: Secondary | ICD-10-CM

## 2011-03-18 LAB — CBC WITH DIFFERENTIAL/PLATELET
EOS%: 0.4 % (ref 0.0–7.0)
Eosinophils Absolute: 0 10*3/uL (ref 0.0–0.5)
HGB: 9.2 g/dL — ABNORMAL LOW (ref 11.6–15.9)
MCV: 88.8 fL (ref 79.5–101.0)
MONO%: 0.4 % (ref 0.0–14.0)
NEUT#: 3.6 10*3/uL (ref 1.5–6.5)
RBC: 3.04 10*6/uL — ABNORMAL LOW (ref 3.70–5.45)
RDW: 24.6 % — ABNORMAL HIGH (ref 11.2–14.5)
lymph#: 2.7 10*3/uL (ref 0.9–3.3)

## 2011-03-18 NOTE — Consult Note (Signed)
[name]            ACCOUNT NO.:  [id number]  MEDICAL RECORD NO.:  [id number]  LOCATION:  1406                         FACILITY:  [facility]  PHYSICIAN:  Laurice Record, M.D.DATE OF BIRTH:  1950-11-09  DATE OF CONSULTATION: DATE OF DISCHARGE:  02/18/2011                                CONSULTATION   REFERRING PHYSICIAN:  [facility].  REASON FOR CONSULTATION:  Lung mass finding.  [name] is a 60 year old white female, patient of Dr. Arline Asp, with history of autoimmune leukopenia and thrombocytopenia dating back ten  years.  When he last saw the patient 8 months ago, her labs were stable and no specific recommendations were made.  She has a history of rheumatoid arthritis, managed with Enbrel injections.  The patient was admitted on January 21, 2011, with right-sided chest pain, cough, and fever for 1 week prior to admission and she was found to have a right lung mass on chest CT scan on January 13, 2011.  The mass measures, 3.9 x 4.6 x 2.9 cm, irregular, heterogeneous in the right middle lobe, abutting the visceral pleura.  Inferior to the mass, lung mass was seen to had collapse.  There were 2 low density lesions in the left lobe of the liver, compatible with cysts.  The adrenal glands were unremarkable. PET scan on January 23, 2011, showed hypermetabolism corresponding to the right middle lobe mass.  Note, on earlier abdominal and pelvic CT scan in July 2012, unremarkable findings, except for having a 5 cm right middle lobe opacity.  On January 26, 2011, the patient received a fiberoptic bronchoscopy with biopsy of the right middle lobe.  Pathology was negative for dysplasia.  It was felt that the patient had pneumonia and was treated with antibiotics.  Her course was complicated by C difficile colitis, managed as an outpatient.  She was re-admitted on February 11, 2011, with generalized weakness and fall.  Her admission labs showed a white count of 3.6,  hemoglobin and hematocrit of 8.9 and 28.3 respectively, and a platelet count of 189, with ANC of 200.  She was febrile at 102 Fahrenheit.  Cultures were drawn and they are pending.  Chest x-ray revealed a moderate right pleural effusion.  She underwent thoracenteses drawing 400 cc of amber colored fluid.  The cytology is pending.    PAST MEDICAL HISTORY: 1. Autoimmune leukopenia. 2. Fibromyalgia. 3. Rheumatoid arthritis, on Enbrel. 4. Status post hysterectomy. 5. History of porphyria cutanea tarda.  ALLERGIES:  PENICILLIN.  MEDICATIONS: 1. Zosyn 3.375 mg IV q.8 h. 2. Vancomycin 1000 mg q.12 h. 3. Flagyl 500 mg b.i.d. 4. Duragesic 50 mcg q.72 h. 5. Cymbalta 60 mg p.o. b.i.d. 6. Lyrica 200 mg t.i.d. 7. Vicodin 1-2 p.o. q.4 h. p.r.n. 8. Prednisone 5 mg daily.  SOCIAL HISTORY:  The patient is married, chronic smoker.  FAMILY HISTORY:  Noncontributory.  REVIEW OF SYSTEMS:  Remarkable for chronic nonproductive cough.  No chest pain.  There is right upper quadrant discomfort.  Denies any chills.  Otherwise, review of systems is negative.  PHYSICAL EXAMINATION:  GENERAL:  This is a 60 year old white female, in no acute distress, alert and oriented x3. VITAL SIGNS:  Blood pressure 148/63, pulse  81, respirations 19, temperature 102.5, saturations 97% in 2 L of oxygen. HEENT:  Normocephalic, atraumatic.  PERRLA.  Oral cavity without thrush or lesions. NECK:  Supple.  No cervical or supraclavicular masses. CARDIOVASCULAR:  Regular rate and rhythm without murmurs, rubs, or gallops. LUNGS:  Essentially clear to auscultation. ABDOMEN:  Soft, slightly tender in the right upper quadrant, but no palpable masses.  Bowel sounds x4. EXTREMITIES:  Without edema.  Pulses present.  LABORATORY DATA:  Hemoglobin 7.1, hematocrit 21.9, white count 0.5, ANC of 0, platelets 175.  Sodium 137, potassium 3.5, chloride 105, CO2 of 21, BUN 8, creatinine 0.51, glucose 178, calcium 8.2.  Total  bilirubin 0.2, AST 9, ALT 6, alkaline phosphatase 32.  IMPRESSION AND PLAN: 1. Autoimmune neutropenia, in the setting of rheumatoid arthritis     which is managed with Enbrel and prednisone, now complicated by     fever and neutropenia.  The patient has been placed on broad-     spectrum coverage.  Cultures are pending.  Would recommend Neupogen     480 mcg subcutaneous daily with neutropenic precautions.     Neutropenia also is complicated by ongoing infection thus obtain quantitative      immunoglobulins. 2. Anemia, status post 2 units of packed RBCs, keep hemoglobin greater     than 7. 3. Right lung mass, felt to be secondary to infection, the patient has     undergone biopsy. 4. Clostridium difficile colitis.  Thank you very much for allowing [place] the opportunity to participate in the care of [name].  Dr. Arline Asp will see pateint when he returns to the office in the morning.     [name], [name]   ______________________________ Laurice Record, M.D.    SW/MEDQ  D:  02/18/2011  T:  02/18/2011  Job:  [id number]  cc:   Thayer Headings, M.D. Fax: [phone]  Electronically Signed by [name] P.A. on 02/19/2011 03:16:00 PM Electronically Signed by Arlan Organ M.D. on 03/18/2011 01:29:14 PM

## 2011-03-19 ENCOUNTER — Other Ambulatory Visit (HOSPITAL_COMMUNITY): Payer: Self-pay | Admitting: Oncology

## 2011-03-19 ENCOUNTER — Encounter (HOSPITAL_BASED_OUTPATIENT_CLINIC_OR_DEPARTMENT_OTHER): Payer: Medicare Other | Admitting: Oncology

## 2011-03-19 DIAGNOSIS — D709 Neutropenia, unspecified: Secondary | ICD-10-CM

## 2011-03-19 DIAGNOSIS — D72819 Decreased white blood cell count, unspecified: Secondary | ICD-10-CM

## 2011-03-19 DIAGNOSIS — D649 Anemia, unspecified: Secondary | ICD-10-CM

## 2011-03-19 LAB — FECAL OCCULT BLOOD, GUAIAC: Occult Blood: NEGATIVE

## 2011-03-20 ENCOUNTER — Encounter (HOSPITAL_BASED_OUTPATIENT_CLINIC_OR_DEPARTMENT_OTHER): Payer: Medicare Other | Admitting: Oncology

## 2011-03-20 ENCOUNTER — Other Ambulatory Visit (HOSPITAL_COMMUNITY): Payer: Self-pay | Admitting: Oncology

## 2011-03-20 ENCOUNTER — Encounter (HOSPITAL_COMMUNITY)
Admission: RE | Admit: 2011-03-20 | Discharge: 2011-03-20 | Disposition: A | Discharge status: Home / Self Care | Payer: Medicare Other | Source: Ambulatory Visit | Attending: Oncology | Admitting: Oncology

## 2011-03-20 DIAGNOSIS — D72819 Decreased white blood cell count, unspecified: Secondary | ICD-10-CM

## 2011-03-20 DIAGNOSIS — D649 Anemia, unspecified: Secondary | ICD-10-CM | POA: Insufficient documentation

## 2011-03-20 DIAGNOSIS — D709 Neutropenia, unspecified: Secondary | ICD-10-CM

## 2011-03-20 LAB — CBC WITH DIFFERENTIAL/PLATELET
BASO%: 0.1 % (ref 0.0–2.0)
Eosinophils Absolute: 0 10*3/uL (ref 0.0–0.5)
HCT: 25 % — ABNORMAL LOW (ref 34.8–46.6)
MCHC: 34.6 g/dL (ref 31.5–36.0)
MONO#: 0 10*3/uL — ABNORMAL LOW (ref 0.1–0.9)
NEUT#: 4.5 10*3/uL (ref 1.5–6.5)
RBC: 2.79 10*6/uL — ABNORMAL LOW (ref 3.70–5.45)
WBC: 5.1 10*3/uL (ref 3.9–10.3)
lymph#: 0.6 10*3/uL — ABNORMAL LOW (ref 0.9–3.3)

## 2011-03-21 ENCOUNTER — Encounter (HOSPITAL_BASED_OUTPATIENT_CLINIC_OR_DEPARTMENT_OTHER): Payer: Medicare Other | Admitting: Oncology

## 2011-03-21 DIAGNOSIS — D709 Neutropenia, unspecified: Secondary | ICD-10-CM

## 2011-03-21 DIAGNOSIS — D72819 Decreased white blood cell count, unspecified: Secondary | ICD-10-CM

## 2011-03-22 ENCOUNTER — Encounter (HOSPITAL_BASED_OUTPATIENT_CLINIC_OR_DEPARTMENT_OTHER): Payer: Medicare Other | Admitting: Oncology

## 2011-03-22 DIAGNOSIS — D709 Neutropenia, unspecified: Secondary | ICD-10-CM

## 2011-03-22 LAB — CROSSMATCH
ABO/RH(D): A POS
Antibody Screen: NEGATIVE
Unit division: 0
Unit division: 0

## 2011-03-23 ENCOUNTER — Encounter (HOSPITAL_BASED_OUTPATIENT_CLINIC_OR_DEPARTMENT_OTHER): Payer: Medicare Other | Admitting: Oncology

## 2011-03-23 DIAGNOSIS — D709 Neutropenia, unspecified: Secondary | ICD-10-CM

## 2011-03-24 ENCOUNTER — Other Ambulatory Visit (HOSPITAL_COMMUNITY): Payer: Self-pay | Admitting: Oncology

## 2011-03-24 ENCOUNTER — Encounter (HOSPITAL_BASED_OUTPATIENT_CLINIC_OR_DEPARTMENT_OTHER): Payer: Medicare Other | Admitting: Oncology

## 2011-03-24 DIAGNOSIS — D709 Neutropenia, unspecified: Secondary | ICD-10-CM

## 2011-03-24 DIAGNOSIS — D72819 Decreased white blood cell count, unspecified: Secondary | ICD-10-CM

## 2011-03-24 DIAGNOSIS — D649 Anemia, unspecified: Secondary | ICD-10-CM

## 2011-03-24 LAB — CBC WITH DIFFERENTIAL/PLATELET
Basophils Absolute: 0 10*3/uL (ref 0.0–0.1)
Eosinophils Absolute: 0 10*3/uL (ref 0.0–0.5)
HGB: 11.1 g/dL — ABNORMAL LOW (ref 11.6–15.9)
LYMPH%: 5.4 % — ABNORMAL LOW (ref 14.0–49.7)
MCV: 90.9 fL (ref 79.5–101.0)
MONO%: 0.3 % (ref 0.0–14.0)
NEUT#: 5.7 10*3/uL (ref 1.5–6.5)
NEUT%: 94.1 % — ABNORMAL HIGH (ref 38.4–76.8)
Platelets: 87 10*3/uL — ABNORMAL LOW (ref 145–400)

## 2011-03-25 ENCOUNTER — Encounter (HOSPITAL_BASED_OUTPATIENT_CLINIC_OR_DEPARTMENT_OTHER): Payer: Medicare Other | Admitting: Oncology

## 2011-03-25 DIAGNOSIS — D709 Neutropenia, unspecified: Secondary | ICD-10-CM

## 2011-03-26 ENCOUNTER — Encounter (HOSPITAL_BASED_OUTPATIENT_CLINIC_OR_DEPARTMENT_OTHER): Payer: Medicare Other | Admitting: Oncology

## 2011-03-26 ENCOUNTER — Encounter: Payer: Medicare Other | Attending: Neurosurgery | Admitting: Neurosurgery

## 2011-03-26 DIAGNOSIS — M67919 Unspecified disorder of synovium and tendon, unspecified shoulder: Secondary | ICD-10-CM | POA: Insufficient documentation

## 2011-03-26 DIAGNOSIS — D759 Disease of blood and blood-forming organs, unspecified: Secondary | ICD-10-CM | POA: Insufficient documentation

## 2011-03-26 DIAGNOSIS — M069 Rheumatoid arthritis, unspecified: Secondary | ICD-10-CM | POA: Insufficient documentation

## 2011-03-26 DIAGNOSIS — IMO0001 Reserved for inherently not codable concepts without codable children: Secondary | ICD-10-CM | POA: Insufficient documentation

## 2011-03-26 DIAGNOSIS — R269 Unspecified abnormalities of gait and mobility: Secondary | ICD-10-CM | POA: Insufficient documentation

## 2011-03-26 DIAGNOSIS — R52 Pain, unspecified: Secondary | ICD-10-CM | POA: Insufficient documentation

## 2011-03-26 DIAGNOSIS — D709 Neutropenia, unspecified: Secondary | ICD-10-CM

## 2011-03-26 DIAGNOSIS — R279 Unspecified lack of coordination: Secondary | ICD-10-CM | POA: Insufficient documentation

## 2011-03-26 DIAGNOSIS — M719 Bursopathy, unspecified: Secondary | ICD-10-CM | POA: Insufficient documentation

## 2011-03-26 NOTE — Assessment & Plan Note (Signed)
HISTORY:  This is a patient of Dr. Riley Kill, seen regarding generalized pain.  She has a history of fibromyalgia as well as RA and somewhat past history of a gait disorder.  She states that she has been under the care Dr. Arline Asp, for a blood dyscrasia and is going to have a total workup to through office.  He has coordinating her prednisone wean from when she was in the hospital a few weeks ago.  She rates no change in her pain at 7-8, it is intermittent and aching type pain.  General activity level is 3-7.  Pain is worse in the morning.  Sleep patterns are fair. Pain is worse with bending and activity.  Rest and medication tend to help.  She walks without assistance.  She can walk about 10 minutes at a time.  She climb steps and drives.  She is on disability.  She needs some help with meal prep, household duties, and shopping.  REVIEW OF SYSTEMS:  She has issues with easy bleeding, skin rash or breakdown, limb swelling, weakness, numbness, trouble, walking, depression, anxiety, no suicidal thoughts.  She did not bring her fentanyl box today for count.  She was warned that would not be tolerated, she needs to comply, and she stated understanding.  PAST MEDICAL HISTORY, SOCIAL HISTORY, AND FAMILY HISTORY:  Unchanged.  PHYSICAL EXAMINATION:  VITAL SIGNS: Blood pressure is 103/53, pulse 94, respirations 18, and O2 sats 95 on room air. MUSCULOSKELETAL:  Motor strength and sensation appear to be intact. CONSTITUTIONAL:  She is within normal limits.  She is alert and oriented times 3.  She has normal gait.  ASSESSMENT: 1. History of rheumatoid arthritis. 2. Fibromyalgia. 3. Bilateral rotator cuff syndrome. 4. Gait disorder some balance impairment.  PLAN: 1. She has decreased her Lyrica 1 twice a day. 2. Fentanyl transdermal 50 mcg once transdermally every 72 hours, 10     with no refill. 3. We are going to continue her on Cymbalta 60 mg 1 p.o. b.i.d. 60     with 2 refills, that was  from her previous primary care doctor and     she has requested we take that over I think it will be a problem. 4. Her questions were encouraged and answered.  We will see her here     in a month.     [name]     Ranelle Oyster, M.D. Electronically Signed   RLW/MedQ D:  03/26/2011 12:42:50  T:  03/26/2011 22:19:38  Job #:  [id number]

## 2011-03-27 ENCOUNTER — Encounter (HOSPITAL_BASED_OUTPATIENT_CLINIC_OR_DEPARTMENT_OTHER): Payer: Medicare Other | Admitting: Oncology

## 2011-03-27 DIAGNOSIS — D709 Neutropenia, unspecified: Secondary | ICD-10-CM

## 2011-03-28 ENCOUNTER — Encounter (HOSPITAL_BASED_OUTPATIENT_CLINIC_OR_DEPARTMENT_OTHER): Payer: Medicare Other | Admitting: Oncology

## 2011-03-29 ENCOUNTER — Encounter: Payer: Medicare Other | Admitting: Oncology

## 2011-03-29 DIAGNOSIS — D709 Neutropenia, unspecified: Secondary | ICD-10-CM

## 2011-03-30 ENCOUNTER — Encounter: Payer: Medicare Other | Admitting: Oncology

## 2011-03-31 ENCOUNTER — Encounter (HOSPITAL_BASED_OUTPATIENT_CLINIC_OR_DEPARTMENT_OTHER): Payer: Medicare Other | Admitting: Oncology

## 2011-03-31 DIAGNOSIS — D709 Neutropenia, unspecified: Secondary | ICD-10-CM

## 2011-04-01 ENCOUNTER — Encounter (HOSPITAL_BASED_OUTPATIENT_CLINIC_OR_DEPARTMENT_OTHER): Payer: Medicare Other | Admitting: Oncology

## 2011-04-01 ENCOUNTER — Other Ambulatory Visit (HOSPITAL_COMMUNITY): Payer: Self-pay | Admitting: Oncology

## 2011-04-01 DIAGNOSIS — D709 Neutropenia, unspecified: Secondary | ICD-10-CM

## 2011-04-01 DIAGNOSIS — D72819 Decreased white blood cell count, unspecified: Secondary | ICD-10-CM

## 2011-04-01 DIAGNOSIS — D649 Anemia, unspecified: Secondary | ICD-10-CM

## 2011-04-01 LAB — CBC WITH DIFFERENTIAL/PLATELET
BASO%: 0.1 % (ref 0.0–2.0)
Eosinophils Absolute: 0 10*3/uL (ref 0.0–0.5)
LYMPH%: 7.5 % — ABNORMAL LOW (ref 14.0–49.7)
MCH: 31.6 pg (ref 25.1–34.0)
MCHC: 34.1 g/dL (ref 31.5–36.0)
MCV: 92.9 fL (ref 79.5–101.0)
MONO%: 0.4 % (ref 0.0–14.0)
Platelets: 73 10*3/uL — ABNORMAL LOW (ref 145–400)
RBC: 2.98 10*6/uL — ABNORMAL LOW (ref 3.70–5.45)

## 2011-04-01 NOTE — Consult Note (Signed)
NAME:  [name], [name]            ACCOUNT NO.:  [id number]  MEDICAL RECORD NO.:  [id number]  LOCATION:  1233                         FACILITY:  [facility]  PHYSICIAN:  Petra Kuba, M.D.    DATE OF BIRTH:  1951/02/13  DATE OF CONSULTATION:  03/01/2011 DATE OF DISCHARGE:                                CONSULTATION   HISTORY OF PRESENT ILLNESS:  The patient is seen at the request of the hospitalist for guaiac positivity and worsening anemia than her recent anemia.  She has a long history of chronic anemia and chronic pancytopenia, followed by Dr. Arline Asp but has no GI symptoms except for she is on iron and does see some dark stools from time to time.  Her baseline hemoglobin is usually around 8 but she dropped to 6 and was guaiac positive and we were consulted for further workup and plans.  She had a colonoscopy she says 4 to 5 years ago at [facility] but not found on the computer, although an endoscopy report was okay back then.  Recently she has had a questionable lung problem although a CT angiogram did not reveal a mass done recently.  She also had C diff recently, helped by Flagyl, but no upper tract symptoms.  PAST MEDICAL HISTORY:  Pertinent for the autoimmune neutropenia as well as the lung problems mentioned above, history of C diff, rheumatoid arthritis, fibromyalgia, and reflux.  She has had an appendectomy, hysterectomy, total knee replacement, rotator cuff, tonsillectomy, tubal ligation and a neurostimulator insertion for bladder incontinence.  ALLERGIES:  PENICILLIN.  HOME MEDICATIONS:  Include Neupogen, potassium, calcium, Cymbalta,Valium, fentanyl patch, Flexeril, iron, hydrocodone, Lyrica, protriptyline prednisone, Prilosec, Florastor, and vitamins.  FAMILY HISTORY:  Negative for any obvious GI problems.  SOCIAL HISTORY:  She does smoke.  Drinks occasionally.  Will use 1 Aleve every 2 months and denies any other over-the-counter medicine use.  REVIEW OF SYSTEMS:   Negative except above.  PHYSICAL EXAM:  GENERAL:  No acute distress, lying comfortable in the bed. VITAL SIGNS:  Stable, afebrile. ABDOMEN:  Soft, nontender.  PERTINENT LABORATORY AND X-RAY DATA:  Pertinent for a PET scan in August which did show the lung abnormality but did not light up any other GI issues.  A CT of the abdomen and pelvis in August showed some diverticula but no other abnormalities.  And a recent ultrasound did confirm her large gallstones which she has had before but no other abnormalities.  Labs include white count of 2.2 with a post transfusion hemoglobin of 8.7, MCV 83, platelets 45.  BUN and creatinine normal. Anemia panel pertinent for ferritin of 393.  She was not iron deficient with normal iron TIBC and percent saturation.  B12 and folate were normal.  PT was normal.  Liver tests were normal.  Albumin 3.2.  ASSESSMENT: 1. Multiple medical problems. 2. Pancytopenia with a slight decrease in her chronic hemoglobin. 3. Guaiac positivity with essentially no GI symptoms but no workup in     the last 4 to 5 years endoscopically.  However, PET scan and CT     recently were okay.  PLAN:  Happy to proceed with colonoscopy and endoscopy if wanted or needed per the  Oncology team.  We will also require them to say it is safe to proceed.  We could proceed either as an inpatient or an outpatient based on her current stability.  Happy to see back p.r.n. Will follow with you.          ______________________________ Petra Kuba, M.D.     MEM/MEDQ  D:  03/01/2011  T:  03/01/2011  Job:  [id number]  cc:   Kalman Shan, MD [address] [place] [address] [place] [address]  Samul Dada, M.D. Fax: [phone]  Electronically Signed by Vida Rigger M.D. on 04/01/2011 02:26:01 PM

## 2011-04-02 ENCOUNTER — Encounter (HOSPITAL_BASED_OUTPATIENT_CLINIC_OR_DEPARTMENT_OTHER): Payer: Medicare Other | Admitting: Oncology

## 2011-04-02 DIAGNOSIS — D709 Neutropenia, unspecified: Secondary | ICD-10-CM

## 2011-04-03 ENCOUNTER — Encounter (HOSPITAL_BASED_OUTPATIENT_CLINIC_OR_DEPARTMENT_OTHER): Payer: Medicare Other | Admitting: Oncology

## 2011-04-03 DIAGNOSIS — D709 Neutropenia, unspecified: Secondary | ICD-10-CM

## 2011-04-04 ENCOUNTER — Encounter (HOSPITAL_BASED_OUTPATIENT_CLINIC_OR_DEPARTMENT_OTHER): Payer: Medicare Other | Admitting: Oncology

## 2011-04-05 ENCOUNTER — Encounter: Payer: Medicare Other | Admitting: Oncology

## 2011-04-06 ENCOUNTER — Encounter: Payer: Medicare Other | Admitting: Oncology

## 2011-04-06 DIAGNOSIS — D709 Neutropenia, unspecified: Secondary | ICD-10-CM

## 2011-04-07 ENCOUNTER — Encounter (HOSPITAL_BASED_OUTPATIENT_CLINIC_OR_DEPARTMENT_OTHER): Payer: Medicare Other | Admitting: Oncology

## 2011-04-07 ENCOUNTER — Other Ambulatory Visit (HOSPITAL_COMMUNITY): Payer: Self-pay | Admitting: Oncology

## 2011-04-07 DIAGNOSIS — D72819 Decreased white blood cell count, unspecified: Secondary | ICD-10-CM

## 2011-04-07 DIAGNOSIS — D649 Anemia, unspecified: Secondary | ICD-10-CM

## 2011-04-07 DIAGNOSIS — D709 Neutropenia, unspecified: Secondary | ICD-10-CM

## 2011-04-07 LAB — CBC WITH DIFFERENTIAL/PLATELET
BASO%: 0 % (ref 0.0–2.0)
LYMPH%: 5.7 % — ABNORMAL LOW (ref 14.0–49.7)
MCHC: 33.6 g/dL (ref 31.5–36.0)
MONO#: 0 10*3/uL — ABNORMAL LOW (ref 0.1–0.9)
Platelets: 110 10*3/uL — ABNORMAL LOW (ref 145–400)
RBC: 3.06 10*6/uL — ABNORMAL LOW (ref 3.70–5.45)
WBC: 10.2 10*3/uL (ref 3.9–10.3)

## 2011-04-08 ENCOUNTER — Encounter (HOSPITAL_BASED_OUTPATIENT_CLINIC_OR_DEPARTMENT_OTHER): Payer: Medicare Other | Admitting: Oncology

## 2011-04-08 DIAGNOSIS — D709 Neutropenia, unspecified: Secondary | ICD-10-CM

## 2011-04-09 ENCOUNTER — Encounter (HOSPITAL_BASED_OUTPATIENT_CLINIC_OR_DEPARTMENT_OTHER): Payer: Medicare Other | Admitting: Oncology

## 2011-04-09 DIAGNOSIS — D709 Neutropenia, unspecified: Secondary | ICD-10-CM

## 2011-04-10 ENCOUNTER — Encounter (HOSPITAL_BASED_OUTPATIENT_CLINIC_OR_DEPARTMENT_OTHER): Payer: Medicare Other | Admitting: Oncology

## 2011-04-10 DIAGNOSIS — D709 Neutropenia, unspecified: Secondary | ICD-10-CM

## 2011-04-11 ENCOUNTER — Encounter (HOSPITAL_BASED_OUTPATIENT_CLINIC_OR_DEPARTMENT_OTHER): Payer: Medicare Other | Admitting: Oncology

## 2011-04-12 ENCOUNTER — Encounter: Payer: Medicare Other | Admitting: Oncology

## 2011-04-12 DIAGNOSIS — D709 Neutropenia, unspecified: Secondary | ICD-10-CM

## 2011-04-13 ENCOUNTER — Ambulatory Visit: Payer: Medicare Other

## 2011-04-14 ENCOUNTER — Other Ambulatory Visit (HOSPITAL_COMMUNITY): Payer: Self-pay | Admitting: Oncology

## 2011-04-14 ENCOUNTER — Ambulatory Visit (HOSPITAL_BASED_OUTPATIENT_CLINIC_OR_DEPARTMENT_OTHER): Payer: Medicare Other

## 2011-04-14 ENCOUNTER — Other Ambulatory Visit (HOSPITAL_BASED_OUTPATIENT_CLINIC_OR_DEPARTMENT_OTHER): Payer: Medicare Other | Admitting: Lab

## 2011-04-14 ENCOUNTER — Encounter: Payer: Self-pay | Admitting: Gastroenterology

## 2011-04-14 ENCOUNTER — Other Ambulatory Visit: Payer: Self-pay | Admitting: Oncology

## 2011-04-14 ENCOUNTER — Ambulatory Visit (INDEPENDENT_AMBULATORY_CARE_PROVIDER_SITE_OTHER): Payer: Medicare Other | Admitting: Gastroenterology

## 2011-04-14 VITALS — BP 106/66 | HR 95 | Temp 98.3°F

## 2011-04-14 DIAGNOSIS — M359 Systemic involvement of connective tissue, unspecified: Secondary | ICD-10-CM

## 2011-04-14 DIAGNOSIS — M0509 Felty's syndrome, multiple sites: Secondary | ICD-10-CM | POA: Insufficient documentation

## 2011-04-14 DIAGNOSIS — D709 Neutropenia, unspecified: Secondary | ICD-10-CM

## 2011-04-14 DIAGNOSIS — D704 Cyclic neutropenia: Secondary | ICD-10-CM

## 2011-04-14 DIAGNOSIS — D649 Anemia, unspecified: Secondary | ICD-10-CM | POA: Insufficient documentation

## 2011-04-14 DIAGNOSIS — M0500 Felty's syndrome, unspecified site: Secondary | ICD-10-CM

## 2011-04-14 DIAGNOSIS — Z1211 Encounter for screening for malignant neoplasm of colon: Secondary | ICD-10-CM

## 2011-04-14 LAB — CBC WITH DIFFERENTIAL/PLATELET
BASO%: 0 % (ref 0.0–2.0)
HCT: 29.5 % — ABNORMAL LOW (ref 34.8–46.6)
MCHC: 33.8 g/dL (ref 31.5–36.0)
MONO#: 0.1 10*3/uL (ref 0.1–0.9)
NEUT%: 85.9 % — ABNORMAL HIGH (ref 38.4–76.8)
RBC: 3.09 10*6/uL — ABNORMAL LOW (ref 3.70–5.45)
RDW: 22.5 % — ABNORMAL HIGH (ref 11.2–14.5)
WBC: 5.7 10*3/uL (ref 3.9–10.3)
lymph#: 0.7 10*3/uL — ABNORMAL LOW (ref 0.9–3.3)

## 2011-04-14 MED ORDER — PEG-KCL-NACL-NASULF-NA ASC-C 100 G PO SOLR: 1.0000 | Freq: Once | ORAL | Status: DC

## 2011-04-14 MED ORDER — FILGRASTIM 480 MCG/0.8ML IJ SOLN
480.0000 ug | Freq: Once | INTRAMUSCULAR | Status: AC
  Administered 2011-04-14: 480 ug via SUBCUTANEOUS
  Filled 2011-04-14: qty 0.8

## 2011-04-14 NOTE — Assessment & Plan Note (Addendum)
Despite no evidence of GI blood loss her recent ongoing transfusion requirement raises the question of GI bleeding.  Recommendations #1 upper endoscopy and lower endoscopy-to be done at the same time

## 2011-04-14 NOTE — Progress Notes (Signed)
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 She's had 3 hospitalizations in the last 3 months for a severe anemia requiring blood transfusions.  She's had no overt GI bleeding including melena or hematochezia. Stools were negative x3 last month.  She is on omeprazole. She also takes low dose prednisone.  Her hemoglobin runs in the 8-10 g range. Platelets are 80-120,000 and her white count varies from 1.9-6.4.      Review of Systems: Pertinent positive and negative review of systems were noted in the above HPI section. All other review of systems were otherwise negative.    Current Medications, Allergies, Past Medical History, Past Surgical History, Family History and Social History were reviewed in [facility] electronic medical record  Vital signs were reviewed in today's medical record. Physical Exam: General: Well developed , well nourished, no acute distress Head: Normocephalic and atraumatic; she is cushingoid appearing Eyes:  sclerae anicteric, EOMI Ears: Normal auditory acuity Mouth: No deformity or lesions Lungs: Clear throughout to auscultation Heart: Regular rate and rhythm; no murmurs, rubs or bruits Abdomen: Soft, non tender and non distended. No masses, hepatosplenomegaly or hernias noted. Normal Bowel sounds Rectal:deferred Musculoskeletal: Symmetrical with no gross deformities  Pulses:  Normal pulses noted Extremities: No clubbing, cyanosis, edema or deformities noted Neurological: Alert oriented x 4, grossly nonfocal Psychological:  Alert and cooperative. Normal mood and affect

## 2011-04-14 NOTE — Patient Instructions (Addendum)
Your Colonoscopy/Endoscopy is scheduled on 04/22/2011 at 2:30pm You can pick up your MoviPrep from your pharmacy today  Colonoscopy A colonoscopy is an exam to evaluate your entire colon. In this exam, your colon is cleansed. A long fiberoptic tube is inserted through your rectum and into your colon. The fiberoptic scope (endoscope) is a long bundle of enclosed and very flexible fibers. These fibers transmit light to the area examined and send images from that area to your caregiver. Discomfort is usually minimal. You may be given a drug to help you sleep (sedative) during or prior to the procedure. This exam helps to detect lumps (tumors), polyps, inflammation, and areas of bleeding. Your caregiver may also take a small piece of tissue (biopsy) that will be examined under a microscope. LET YOUR CAREGIVER KNOW ABOUT:   Allergies to food or medicine.   Medicines taken, including vitamins, herbs, eyedrops, over-the-counter medicines, and creams.   Use of steroids (by mouth or creams).   Previous problems with anesthetics or numbing medicines.   History of bleeding problems or blood clots.   Previous surgery.   Other health problems, including diabetes and kidney problems.   Possibility of pregnancy, if this applies.  BEFORE THE PROCEDURE   A clear liquid diet may be required for 2 days before the exam.   Ask your caregiver about changing or stopping your regular medications.   Liquid injections (enemas) or laxatives may be required.   A large amount of electrolyte solution may be given to you to drink over a short period of time. This solution is used to clean out your colon.   You should be present 60 minutes prior to your procedure or as directed by your caregiver.  AFTER THE PROCEDURE   If you received a sedative or pain relieving medication, you will need to arrange for someone to drive you home.   Occasionally, there is a little blood passed with the first bowel movement. Do  not be concerned.  FINDING OUT THE RESULTS OF YOUR TEST Not all test results are available during your visit. If your test results are not back during the visit, make an appointment with your caregiver to find out the results. Do not assume everything is normal if you have not heard from your caregiver or the medical facility. It is important for you to follow up on all of your test results. HOME CARE INSTRUCTIONS   It is not unusual to pass moderate amounts of gas and experience mild abdominal cramping following the procedure. This is due to air being used to inflate your colon during the exam. Walking or a warm pack on your belly (abdomen) may help.   You may resume all normal meals and activities after sedatives and medicines have worn off.   Only take over-the-counter or prescription medicines for pain, discomfort, or fever as directed by your caregiver. Do not use aspirin or blood thinners if a biopsy was taken. Consult your caregiver for medicine usage if biopsies were taken.  SEEK IMMEDIATE MEDICAL CARE IF:   You have a fever.   You pass large blood clots or fill a toilet with blood following the procedure. This may also occur 10 to 14 days following the procedure. This is more likely if a biopsy was taken.   You develop abdominal pain that keeps getting worse and cannot be relieved with medicine.  Document Released: 05/23/2000 Document Revised: 02/05/2011 Document Reviewed: 01/06/2008 [facility] Patient Information 2012 [place], [place]. You will go to the  basement today for your Hemoccult IFOB kit, Please return within 2 weeks

## 2011-04-15 ENCOUNTER — Ambulatory Visit (HOSPITAL_BASED_OUTPATIENT_CLINIC_OR_DEPARTMENT_OTHER): Payer: Medicare Other

## 2011-04-15 VITALS — BP 94/64 | HR 112 | Temp 99.9°F

## 2011-04-15 DIAGNOSIS — D709 Neutropenia, unspecified: Secondary | ICD-10-CM

## 2011-04-15 DIAGNOSIS — M359 Systemic involvement of connective tissue, unspecified: Secondary | ICD-10-CM

## 2011-04-15 MED ORDER — FILGRASTIM 480 MCG/0.8ML IJ SOLN
480.0000 ug | Freq: Once | INTRAMUSCULAR | Status: AC
  Administered 2011-04-15: 480 ug via SUBCUTANEOUS
  Filled 2011-04-15: qty 0.8

## 2011-04-16 ENCOUNTER — Ambulatory Visit (HOSPITAL_BASED_OUTPATIENT_CLINIC_OR_DEPARTMENT_OTHER): Payer: Medicare Other

## 2011-04-16 VITALS — BP 108/76 | HR 105 | Temp 97.5°F

## 2011-04-16 DIAGNOSIS — D704 Cyclic neutropenia: Secondary | ICD-10-CM

## 2011-04-16 DIAGNOSIS — D708 Other neutropenia: Secondary | ICD-10-CM

## 2011-04-16 MED ORDER — FILGRASTIM 480 MCG/0.8ML IJ SOLN
480.0000 ug | Freq: Once | INTRAMUSCULAR | Status: AC
  Administered 2011-04-16: 480 ug via SUBCUTANEOUS
  Filled 2011-04-16: qty 0.8

## 2011-04-17 ENCOUNTER — Other Ambulatory Visit: Payer: Self-pay | Admitting: Oncology

## 2011-04-17 ENCOUNTER — Ambulatory Visit (HOSPITAL_BASED_OUTPATIENT_CLINIC_OR_DEPARTMENT_OTHER): Payer: Medicare Other

## 2011-04-17 VITALS — BP 112/73 | HR 97 | Temp 97.1°F

## 2011-04-17 DIAGNOSIS — D704 Cyclic neutropenia: Secondary | ICD-10-CM

## 2011-04-17 DIAGNOSIS — D708 Other neutropenia: Secondary | ICD-10-CM

## 2011-04-17 MED ORDER — FILGRASTIM 480 MCG/0.8ML IJ SOLN
480.0000 ug | Freq: Once | INTRAMUSCULAR | Status: AC
  Administered 2011-04-17: 480 ug via SUBCUTANEOUS
  Filled 2011-04-17: qty 0.8

## 2011-04-18 ENCOUNTER — Other Ambulatory Visit: Payer: Self-pay | Admitting: Oncology

## 2011-04-18 ENCOUNTER — Ambulatory Visit (HOSPITAL_BASED_OUTPATIENT_CLINIC_OR_DEPARTMENT_OTHER): Payer: Medicare Other

## 2011-04-18 VITALS — BP 104/65 | HR 95 | Temp 98.2°F | Wt 170.4 lb

## 2011-04-18 DIAGNOSIS — M359 Systemic involvement of connective tissue, unspecified: Secondary | ICD-10-CM

## 2011-04-18 DIAGNOSIS — D709 Neutropenia, unspecified: Secondary | ICD-10-CM

## 2011-04-18 MED ORDER — FILGRASTIM 480 MCG/0.8ML IJ SOLN
480.0000 ug | Freq: Once | INTRAMUSCULAR | Status: AC
  Administered 2011-04-18: 480 ug via SUBCUTANEOUS
  Filled 2011-04-18: qty 0.8

## 2011-04-19 ENCOUNTER — Ambulatory Visit (HOSPITAL_BASED_OUTPATIENT_CLINIC_OR_DEPARTMENT_OTHER): Payer: Medicare Other

## 2011-04-19 VITALS — BP 113/72 | HR 96 | Temp 97.6°F

## 2011-04-19 DIAGNOSIS — D708 Other neutropenia: Secondary | ICD-10-CM

## 2011-04-19 DIAGNOSIS — M359 Systemic involvement of connective tissue, unspecified: Secondary | ICD-10-CM

## 2011-04-19 MED ORDER — FILGRASTIM 480 MCG/0.8ML IJ SOLN
480.0000 ug | Freq: Once | INTRAMUSCULAR | Status: AC
Start: 2011-04-19 — End: 2011-04-19
  Administered 2011-04-19: 480 ug via SUBCUTANEOUS

## 2011-04-21 ENCOUNTER — Other Ambulatory Visit (HOSPITAL_BASED_OUTPATIENT_CLINIC_OR_DEPARTMENT_OTHER): Payer: Medicare Other | Admitting: Lab

## 2011-04-21 ENCOUNTER — Ambulatory Visit (HOSPITAL_BASED_OUTPATIENT_CLINIC_OR_DEPARTMENT_OTHER): Payer: Medicare Other

## 2011-04-21 ENCOUNTER — Other Ambulatory Visit (HOSPITAL_COMMUNITY): Payer: Self-pay | Admitting: Oncology

## 2011-04-21 VITALS — BP 118/79 | HR 101 | Temp 99.2°F

## 2011-04-21 DIAGNOSIS — M359 Systemic involvement of connective tissue, unspecified: Secondary | ICD-10-CM

## 2011-04-21 DIAGNOSIS — D704 Cyclic neutropenia: Secondary | ICD-10-CM

## 2011-04-21 DIAGNOSIS — D708 Other neutropenia: Secondary | ICD-10-CM

## 2011-04-21 LAB — CBC & DIFF AND RETIC
Basophils Absolute: 0 10*3/uL (ref 0.0–0.1)
Eosinophils Absolute: 0 10*3/uL (ref 0.0–0.5)
HCT: 29.8 % — ABNORMAL LOW (ref 34.8–46.6)
HGB: 9.7 g/dL — ABNORMAL LOW (ref 11.6–15.9)
LYMPH%: 14.1 % (ref 14.0–49.7)
MCV: 96.8 fL (ref 79.5–101.0)
MONO%: 5.9 % (ref 0.0–14.0)
NEUT#: 4.5 10*3/uL (ref 1.5–6.5)
NEUT%: 79.4 % — ABNORMAL HIGH (ref 38.4–76.8)
Platelets: 113 10*3/uL — ABNORMAL LOW (ref 145–400)
Retic Ct Abs: 186.34 10*3/uL — ABNORMAL HIGH (ref 33.70–90.70)

## 2011-04-21 LAB — COMPREHENSIVE METABOLIC PANEL
ALT: 12 U/L (ref 0–35)
AST: 17 U/L (ref 0–37)
Albumin: 3.7 g/dL (ref 3.5–5.2)
Alkaline Phosphatase: 92 U/L (ref 39–117)
BUN: 8 mg/dL (ref 6–23)
Calcium: 8.6 mg/dL (ref 8.4–10.5)
Chloride: 103 mEq/L (ref 96–112)
Potassium: 3.7 mEq/L (ref 3.5–5.3)
Sodium: 140 mEq/L (ref 135–145)
Total Protein: 6.6 g/dL (ref 6.0–8.3)

## 2011-04-21 LAB — LACTATE DEHYDROGENASE: LDH: 178 U/L (ref 94–250)

## 2011-04-21 LAB — SEDIMENTATION RATE: Sed Rate: 40 mm/hr — ABNORMAL HIGH (ref 0–22)

## 2011-04-21 LAB — MORPHOLOGY: PLT EST: DECREASED

## 2011-04-21 MED ORDER — FILGRASTIM 480 MCG/0.8ML IJ SOLN
480.0000 ug | Freq: Once | INTRAMUSCULAR | Status: AC
  Administered 2011-04-21: 480 ug via SUBCUTANEOUS
  Filled 2011-04-21: qty 0.8

## 2011-04-22 ENCOUNTER — Encounter: Payer: Self-pay | Admitting: Gastroenterology

## 2011-04-22 ENCOUNTER — Ambulatory Visit (HOSPITAL_BASED_OUTPATIENT_CLINIC_OR_DEPARTMENT_OTHER): Payer: Medicare Other

## 2011-04-22 ENCOUNTER — Ambulatory Visit (AMBULATORY_SURGERY_CENTER): Payer: Medicare Other | Admitting: Gastroenterology

## 2011-04-22 VITALS — BP 116/78 | HR 97 | Temp 98.1°F

## 2011-04-22 VITALS — BP 97/68 | HR 82 | Temp 99.4°F | Resp 15 | Ht 62.0 in | Wt 170.0 lb

## 2011-04-22 DIAGNOSIS — D649 Anemia, unspecified: Secondary | ICD-10-CM

## 2011-04-22 DIAGNOSIS — K5730 Diverticulosis of large intestine without perforation or abscess without bleeding: Secondary | ICD-10-CM

## 2011-04-22 DIAGNOSIS — D709 Neutropenia, unspecified: Secondary | ICD-10-CM

## 2011-04-22 DIAGNOSIS — M359 Systemic involvement of connective tissue, unspecified: Secondary | ICD-10-CM

## 2011-04-22 MED ORDER — SODIUM CHLORIDE 0.9 % IV SOLN: 500.0000 mL | INTRAVENOUS | Status: DC

## 2011-04-22 MED ORDER — FILGRASTIM 480 MCG/0.8ML IJ SOLN
480.0000 ug | Freq: Once | INTRAMUSCULAR | Status: AC
  Administered 2011-04-22: 480 ug via SUBCUTANEOUS
  Filled 2011-04-22: qty 0.8

## 2011-04-22 NOTE — Progress Notes (Signed)
Erika Bailey, CRNA administered propofol to the pt for sedation.  MAW  hung second bag of 0.9% N.S.569ml at 15:24. maw   Pt tolerated the colonoscopy very well. Maw  Pt tolerated the egd very well. maw

## 2011-04-22 NOTE — Patient Instructions (Addendum)
Mild Diverticulosis Normal EGD - Hemmoccult stools in 4-5 days Cards given and explained   Please follow all discharge instructions given to you by the recovery room nurse. If you have any questions or problems after discharge please call [phone]. You will receive a phone call in the am to see how you are doing and answer any questions you may have. Thank you for choosing [facility] for your health care needs.

## 2011-04-22 NOTE — Progress Notes (Signed)
Patient did not experience any of the following events: a burn prior to discharge; a fall within the facility; wrong site/side/patient/procedure/implant event; or a hospital transfer or hospital admission upon discharge from the facility. (G8907) Patient did not have preoperative order for IV antibiotic SSI prophylaxis. (G8918)  

## 2011-04-22 NOTE — Progress Notes (Signed)
Pt has neurostimulator for bladder control

## 2011-04-23 ENCOUNTER — Ambulatory Visit (HOSPITAL_BASED_OUTPATIENT_CLINIC_OR_DEPARTMENT_OTHER): Payer: Medicare Other

## 2011-04-23 ENCOUNTER — Encounter: Payer: Medicare Other | Attending: Physical Medicine & Rehabilitation | Admitting: Physical Medicine & Rehabilitation

## 2011-04-23 ENCOUNTER — Telehealth: Payer: Self-pay | Admitting: *Deleted

## 2011-04-23 VITALS — BP 119/73 | HR 97 | Temp 98.2°F

## 2011-04-23 DIAGNOSIS — M67919 Unspecified disorder of synovium and tendon, unspecified shoulder: Secondary | ICD-10-CM | POA: Insufficient documentation

## 2011-04-23 DIAGNOSIS — IMO0001 Reserved for inherently not codable concepts without codable children: Secondary | ICD-10-CM | POA: Insufficient documentation

## 2011-04-23 DIAGNOSIS — F329 Major depressive disorder, single episode, unspecified: Secondary | ICD-10-CM

## 2011-04-23 DIAGNOSIS — D704 Cyclic neutropenia: Secondary | ICD-10-CM

## 2011-04-23 DIAGNOSIS — M7530 Calcific tendinitis of unspecified shoulder: Secondary | ICD-10-CM

## 2011-04-23 DIAGNOSIS — D708 Other neutropenia: Secondary | ICD-10-CM

## 2011-04-23 DIAGNOSIS — M719 Bursopathy, unspecified: Secondary | ICD-10-CM | POA: Insufficient documentation

## 2011-04-23 DIAGNOSIS — R269 Unspecified abnormalities of gait and mobility: Secondary | ICD-10-CM | POA: Insufficient documentation

## 2011-04-23 DIAGNOSIS — M069 Rheumatoid arthritis, unspecified: Secondary | ICD-10-CM

## 2011-04-23 MED ORDER — FILGRASTIM 480 MCG/0.8ML IJ SOLN
480.0000 ug | Freq: Once | INTRAMUSCULAR | Status: AC
  Administered 2011-04-23: 480 ug via SUBCUTANEOUS
  Filled 2011-04-23: qty 0.8

## 2011-04-23 NOTE — Assessment & Plan Note (Signed)
[place] is back regarding chronic pain syndrome.  Dr. Arline Asp is taking care of Ms. Carias regarding her blood dyscrasia and workup is still in progress.  She had colonoscopy and endoscopy apparently recently and this was within normal limits.  She has had some increased right shoulder pain of late.  Fentanyl patch in general was working well.  Her Lyrica was decreased to twice a day at last visit.  She has been doing pretty well with this.  She does not want to come off the medication as she noted previously when weaned off that her pain increased quite a bit.  The patient's pain today is a 5/10.  Sleep is fair.  REVIEW OF SYSTEMS:  Notable for numbness, tremor, tingling, depression, anxiety.  Full 12 point review is in the written health and history section of the chart.  SOCIAL HISTORY:  Unchanged.  She is living with her spouse.  She is hoping to travel to [place] over Thanksgiving.  PHYSICAL EXAMINATION:  VITAL SIGNS:  Blood pressure 123/76, pulse 87, respiratory rate 16, she is satting 97% on room air. GENERAL:  The patient is pleasant, alert. MUSCULOSKELETAL:  Right shoulder is tender to range of motion.  She had pain when she had 90 degrees of active/passive abduction.  The patient had pain with rotator cuff impingement maneuvers and subdeltoid bursa was tender to touch. SKIN:  Intact.  She did have multiple petechiae over the arms and legs today. NEUROLOGIC:  Strength otherwise as well as sensation was fairly intact and grossly 4/5.  Sensation was 2/2.  Cognitively she is alert and appropriate with good insight and awareness as well as memory.  ASSESSMENT: 1. History of rheumatoid arthritis. 2. Fibromyalgia. 3. Bilateral rotator cuff syndrome, right more affected than left. 4. Gait disorder with balance impairment.  PLAN: 1. We have not been able to pursue physical therapy due to her medical     issues noted above.  We will hold off on that for the moment. 2. Stay  with Lyrica at 200 mg b.i.d.  She is doing well with that     dose. 3. Refilled fentanyl patch 50 mcg 1 q.72 hours, #10. 4. Will stay with Cymbalta as well as this seems to be working for     her. 5. After informed consent, we injected the right shoulder via lateral     and posterior approach with a total of 60 mg Kenalog and 4 mL 1%     lidocaine.  The patient tolerated it well.  Discussed appropriate     exercise and activity.     She should not try to over do it during the holiday season with     cooking, etc. 6. She will see my nurse practitioner back in about a month and I will     see her in 3-4 months.     Ranelle Oyster, M.D. Electronically Signed    ZTS/MedQ D:  04/23/2011 13:09:56  T:  04/23/2011 18:38:20  Job #:  [id number]  cc:   Samul Dada, M.D. Fax: [phone]

## 2011-04-23 NOTE — Telephone Encounter (Signed)

## 2011-04-24 ENCOUNTER — Ambulatory Visit (HOSPITAL_BASED_OUTPATIENT_CLINIC_OR_DEPARTMENT_OTHER): Payer: Medicare Other

## 2011-04-24 VITALS — BP 130/74 | HR 92 | Temp 98.6°F

## 2011-04-24 DIAGNOSIS — M359 Systemic involvement of connective tissue, unspecified: Secondary | ICD-10-CM

## 2011-04-24 DIAGNOSIS — D708 Other neutropenia: Secondary | ICD-10-CM

## 2011-04-24 MED ORDER — FILGRASTIM 480 MCG/0.8ML IJ SOLN
480.0000 ug | Freq: Once | INTRAMUSCULAR | Status: AC
  Administered 2011-04-24: 480 ug via SUBCUTANEOUS
  Filled 2011-04-24: qty 0.8

## 2011-04-25 ENCOUNTER — Other Ambulatory Visit (HOSPITAL_BASED_OUTPATIENT_CLINIC_OR_DEPARTMENT_OTHER): Payer: Medicare Other | Admitting: Lab

## 2011-04-25 ENCOUNTER — Ambulatory Visit: Payer: Medicare Other

## 2011-04-25 ENCOUNTER — Ambulatory Visit (HOSPITAL_BASED_OUTPATIENT_CLINIC_OR_DEPARTMENT_OTHER): Payer: Medicare Other | Admitting: Oncology

## 2011-04-25 VITALS — BP 116/77 | HR 89 | Temp 97.6°F | Ht 62.0 in | Wt 169.5 lb

## 2011-04-25 DIAGNOSIS — D704 Cyclic neutropenia: Secondary | ICD-10-CM

## 2011-04-25 DIAGNOSIS — D708 Other neutropenia: Secondary | ICD-10-CM

## 2011-04-25 DIAGNOSIS — D649 Anemia, unspecified: Secondary | ICD-10-CM

## 2011-04-25 LAB — CBC WITH DIFFERENTIAL/PLATELET
Basophils Absolute: 0 10*3/uL (ref 0.0–0.1)
HCT: 31.3 % — ABNORMAL LOW (ref 34.8–46.6)
HGB: 10.6 g/dL — ABNORMAL LOW (ref 11.6–15.9)
MONO#: 0.1 10*3/uL (ref 0.1–0.9)
NEUT#: 14.8 10*3/uL — ABNORMAL HIGH (ref 1.5–6.5)
NEUT%: 90.6 % — ABNORMAL HIGH (ref 38.4–76.8)
WBC: 16.4 10*3/uL — ABNORMAL HIGH (ref 3.9–10.3)
lymph#: 1.4 10*3/uL (ref 0.9–3.3)

## 2011-04-25 MED ORDER — FILGRASTIM 480 MCG/0.8ML IJ SOLN
480.0000 ug | Freq: Once | INTRAMUSCULAR | Status: AC
  Administered 2011-04-25: 480 ug via SUBCUTANEOUS
  Filled 2011-04-25: qty 0.8

## 2011-04-25 NOTE — Progress Notes (Signed)
This office note has been dictated.  #[id number]

## 2011-04-25 NOTE — Progress Notes (Signed)
CC:   Thayer Headings, M.D. Ollen Gross, M.D. Barbette Hair. Arlyce Dice, MD,FACG Kalman Shan, MD Alben Deeds, MD  HISTORY:  [name] was seen today for followup of multiple problems as outlined in my note of 03/07/2011.  The main problem that we are following relates to her autoimmune neutropenia currently being treated with prednisone and Neupogen.  Currently the patient is taking prednisone 30 mg daily.  We have tapered this down from 80 mg daily. The patient currently is on Neupogen 480 mcg daily except on Sundays because of logistical problems.  Fortunately [name] has done well. She was last seen by [place] on 04/07/2011.  Her blood counts have held up quite well, and she has not had any infections or fever.  Unfortunately with the tapering of her prednisone, she has once again developed a flare of her rheumatoid arthritis.  She also says that she is having fibromyalgia symptoms.  She has had some swelling of her MCP joints, and I believe also her knees.  The patient denies any obvious blood in her stools.  She has undergone endoscopy and colonoscopy by Dr. Melvia Heaps on 04/22/2011.  She had some mild diverticulosis in the sigmoid colon.  Otherwise her colonoscopy and EGD were negative.  Dr. Arlyce Dice recommended repeat colonoscopy in 10 years.  Despite the negative findings, we are pretty sure that the patient was having GI bleeding when she was hospitalized back in September.  [name] tells me that she has been under a lot of stress lately. Her husband suffers from dementia and apparently that has gotten a lot worse.  [name] tells me that she will be undergoing a CT scan of the chest on December 5th and that will be followed up by Dr. Marchelle Gearing. It will be recalled that the patient had a right middle lobe mass that was felt to be most likely due to an inflammatory process, but the possibility of cancer cannot be excluded and hopefully there will be resolution  of this mass.  Medicines are reviewed and recorded.  The patient is on quite an extensive list of medicines.  We are going to decrease the prednisone from 30 mg a day to 20 mg a day.  Hopefully we can continue reducing this.  As stated, the patient is having a flare of her rheumatoid arthritis and I urged her to make an earlier appointment with Dr. Dierdre Forth.  We are also going to make an adjustment in the Neupogen given the fact that the patient's blood counts have held up well.  We are going to try Neupogen 480 mcg on Monday, Wednesdays and Fridays.  PHYSICAL EXAMINATION:  There is little change.  The patient looks well. Weight is stable at 169 pounds 8 ounces, height 5 feet 2 inches, body surface area 1.83 m squared.  Blood pressure 116/77, pulse 89 and regular, respirations regular and unlabored.  The patient is afebrile. There is no scleral icterus.  Mouth and pharynx are benign.  There is no peripheral adenopathy palpable.  Heart/lungs:  Normal.  Abdomen:  Obese, nontender with no organomegaly or masses palpable.  Extremities:  No peripheral edema.  The patient has lots of purpura and petechial lesions, particularly over her upper extremities.  LABORATORY DATA:  Today white count 16.4, ANC 14.8, hemoglobin 10.6, hematocrit 31.3 and platelets are 122,000.  Blood counts have been fairly stable although today the white count is higher than it had been. On 04/20/2011 white count was 5.7, ANC 4.5.  On 11/05  the white count was 5.7 and ANC 4.9.  Platelet counts have been fairly stable as has the hemoglobin and hematocrit.  We have chemistries is from 04/21/2011 that were normal except for glucose of 135.  Sedimentation rate on that date was 40.  We were giving the patient stool cards today.  IMPRESSION AND PLAN:  [name] clinical condition remained stable as are her blood counts on the current treatment program.  We are going to decrease the prednisone to 20 mg daily from 30 mg  daily.  Neupogen will now be 480 mcg subcu Mondays, Wednesdays and Fridays.  Stool cards were given to check for occult blood.  We are going to check CBCs weekly every Friday and I will plan to reconvene with [name] in 4 weeks which will be December 14th at which time we will check CBC, CMET, and sedimentation rate.  Hopefully we can continue to taper the prednisone and possibly the Neupogen.  As stated, [name] will have a CT scan of the chest on December 5th.    ______________________________ Samul Dada, M.D. DSM/MEDQ  D:  04/25/2011  T:  04/25/2011  Job:  [id number]

## 2011-04-28 ENCOUNTER — Other Ambulatory Visit (HOSPITAL_BASED_OUTPATIENT_CLINIC_OR_DEPARTMENT_OTHER): Payer: Medicare Other | Admitting: Lab

## 2011-04-28 ENCOUNTER — Ambulatory Visit (HOSPITAL_BASED_OUTPATIENT_CLINIC_OR_DEPARTMENT_OTHER): Payer: Medicare Other

## 2011-04-28 VITALS — BP 132/88 | HR 96 | Temp 97.6°F

## 2011-04-28 DIAGNOSIS — D708 Other neutropenia: Secondary | ICD-10-CM

## 2011-04-28 DIAGNOSIS — M359 Systemic involvement of connective tissue, unspecified: Secondary | ICD-10-CM

## 2011-04-28 DIAGNOSIS — D704 Cyclic neutropenia: Secondary | ICD-10-CM

## 2011-04-28 LAB — CBC WITH DIFFERENTIAL/PLATELET
Eosinophils Absolute: 0 10*3/uL (ref 0.0–0.5)
LYMPH%: 13.3 % — ABNORMAL LOW (ref 14.0–49.7)
MONO#: 0.2 10*3/uL (ref 0.1–0.9)
NEUT#: 5.2 10*3/uL (ref 1.5–6.5)
Platelets: 142 10*3/uL — ABNORMAL LOW (ref 145–400)
RBC: 3.23 10*6/uL — ABNORMAL LOW (ref 3.70–5.45)
RDW: 18.6 % — ABNORMAL HIGH (ref 11.2–14.5)
WBC: 6.3 10*3/uL (ref 3.9–10.3)

## 2011-04-28 MED ORDER — FILGRASTIM 480 MCG/0.8ML IJ SOLN
480.0000 ug | Freq: Once | INTRAMUSCULAR | Status: AC
  Administered 2011-04-28: 480 ug via SUBCUTANEOUS
  Filled 2011-04-28: qty 0.8

## 2011-04-29 ENCOUNTER — Other Ambulatory Visit: Payer: Self-pay | Admitting: Family Medicine

## 2011-04-29 ENCOUNTER — Other Ambulatory Visit: Payer: Medicare Other

## 2011-04-29 DIAGNOSIS — Z1211 Encounter for screening for malignant neoplasm of colon: Secondary | ICD-10-CM

## 2011-04-29 LAB — FECAL OCCULT BLOOD, IMMUNOCHEMICAL: Fecal Occult Bld: NEGATIVE

## 2011-04-30 ENCOUNTER — Ambulatory Visit (HOSPITAL_BASED_OUTPATIENT_CLINIC_OR_DEPARTMENT_OTHER): Payer: Medicare Other

## 2011-04-30 ENCOUNTER — Encounter: Payer: Self-pay | Admitting: *Deleted

## 2011-04-30 VITALS — BP 121/78 | HR 98 | Temp 98.1°F

## 2011-04-30 DIAGNOSIS — D704 Cyclic neutropenia: Secondary | ICD-10-CM

## 2011-04-30 DIAGNOSIS — D708 Other neutropenia: Secondary | ICD-10-CM

## 2011-04-30 MED ORDER — FILGRASTIM 480 MCG/0.8ML IJ SOLN
480.0000 ug | Freq: Once | INTRAMUSCULAR | Status: AC
  Administered 2011-04-30: 480 ug via SUBCUTANEOUS
  Filled 2011-04-30: qty 0.8

## 2011-05-02 ENCOUNTER — Ambulatory Visit (HOSPITAL_BASED_OUTPATIENT_CLINIC_OR_DEPARTMENT_OTHER): Payer: Medicare Other

## 2011-05-02 VITALS — BP 118/81 | HR 88 | Temp 98.0°F

## 2011-05-02 DIAGNOSIS — D708 Other neutropenia: Secondary | ICD-10-CM

## 2011-05-02 DIAGNOSIS — M359 Systemic involvement of connective tissue, unspecified: Secondary | ICD-10-CM

## 2011-05-02 MED ORDER — FILGRASTIM 480 MCG/0.8ML IJ SOLN
480.0000 ug | Freq: Once | INTRAMUSCULAR | Status: AC
  Administered 2011-05-02: 480 ug via SUBCUTANEOUS
  Filled 2011-05-02: qty 0.8

## 2011-05-05 ENCOUNTER — Ambulatory Visit (HOSPITAL_BASED_OUTPATIENT_CLINIC_OR_DEPARTMENT_OTHER): Payer: Medicare Other

## 2011-05-05 ENCOUNTER — Other Ambulatory Visit (HOSPITAL_BASED_OUTPATIENT_CLINIC_OR_DEPARTMENT_OTHER): Payer: Medicare Other | Admitting: Lab

## 2011-05-05 VITALS — BP 122/82 | HR 99 | Temp 97.7°F

## 2011-05-05 DIAGNOSIS — D704 Cyclic neutropenia: Secondary | ICD-10-CM

## 2011-05-05 DIAGNOSIS — D708 Other neutropenia: Secondary | ICD-10-CM

## 2011-05-05 DIAGNOSIS — M359 Systemic involvement of connective tissue, unspecified: Secondary | ICD-10-CM

## 2011-05-05 LAB — CBC WITH DIFFERENTIAL/PLATELET
EOS%: 0.5 % (ref 0.0–7.0)
LYMPH%: 20.8 % (ref 14.0–49.7)
MCH: 32.8 pg (ref 25.1–34.0)
MCHC: 33.1 g/dL (ref 31.5–36.0)
MCV: 99.2 fL (ref 79.5–101.0)
MONO%: 7.6 % (ref 0.0–14.0)
RBC: 3.77 10*6/uL (ref 3.70–5.45)
RDW: 18.3 % — ABNORMAL HIGH (ref 11.2–14.5)

## 2011-05-05 MED ORDER — FILGRASTIM 480 MCG/0.8ML IJ SOLN
480.0000 ug | Freq: Once | INTRAMUSCULAR | Status: AC
  Administered 2011-05-05: 480 ug via SUBCUTANEOUS
  Filled 2011-05-05: qty 0.8

## 2011-05-06 ENCOUNTER — Telehealth: Payer: Self-pay | Admitting: Internal Medicine

## 2011-05-06 ENCOUNTER — Other Ambulatory Visit: Payer: Self-pay | Admitting: Oncology

## 2011-05-06 NOTE — Telephone Encounter (Signed)
[name]  She is having ct chest sometime in December. Needs fu appt with me after ct  Thanks  MR

## 2011-05-07 ENCOUNTER — Ambulatory Visit: Payer: Medicare Other

## 2011-05-07 ENCOUNTER — Other Ambulatory Visit: Payer: Self-pay

## 2011-05-07 ENCOUNTER — Ambulatory Visit (HOSPITAL_BASED_OUTPATIENT_CLINIC_OR_DEPARTMENT_OTHER): Payer: Medicare Other

## 2011-05-07 ENCOUNTER — Other Ambulatory Visit: Payer: Self-pay | Admitting: Oncology

## 2011-05-07 VITALS — BP 117/76 | HR 75 | Temp 97.5°F

## 2011-05-07 DIAGNOSIS — D704 Cyclic neutropenia: Secondary | ICD-10-CM

## 2011-05-07 DIAGNOSIS — D708 Other neutropenia: Secondary | ICD-10-CM

## 2011-05-07 MED ORDER — FILGRASTIM 480 MCG/0.8ML IJ SOLN
480.0000 ug | Freq: Once | INTRAMUSCULAR | Status: AC
  Administered 2011-05-07: 480 ug via SUBCUTANEOUS
  Filled 2011-05-07: qty 0.8

## 2011-05-07 NOTE — Telephone Encounter (Signed)
CT chest has not bee scheduled yet. I will send a message to [facility] to go ahead and schedule the CT so we can get ROV set as well. [facility] please schedule CT for pt in December, order is in, and let me know when it is so I can schedule ROV with MR after. Thanks!

## 2011-05-07 NOTE — Telephone Encounter (Signed)
Will route message to [facility] per her request to schedule appt for pt.

## 2011-05-07 NOTE — Telephone Encounter (Signed)
CT Chest has been scheduled for Friday 05/16/11 at 11:00 at [facility]. No prep.

## 2011-05-08 ENCOUNTER — Other Ambulatory Visit: Payer: Self-pay | Admitting: *Deleted

## 2011-05-08 NOTE — Telephone Encounter (Signed)
PT RETURNED CALL. I HAVE SCHEDULED A F/U AFTER CT. [name]

## 2011-05-08 NOTE — Telephone Encounter (Signed)
[id number] to get pt set to see MR after CT on 05-16-11.Carron Curie, CMA

## 2011-05-09 ENCOUNTER — Ambulatory Visit (HOSPITAL_BASED_OUTPATIENT_CLINIC_OR_DEPARTMENT_OTHER): Payer: Medicare Other

## 2011-05-09 VITALS — BP 107/72 | HR 87 | Temp 97.4°F

## 2011-05-09 DIAGNOSIS — M359 Systemic involvement of connective tissue, unspecified: Secondary | ICD-10-CM

## 2011-05-09 DIAGNOSIS — D708 Other neutropenia: Secondary | ICD-10-CM

## 2011-05-09 MED ORDER — FILGRASTIM 480 MCG/0.8ML IJ SOLN
480.0000 ug | Freq: Once | INTRAMUSCULAR | Status: AC
  Administered 2011-05-09: 480 ug via SUBCUTANEOUS
  Filled 2011-05-09: qty 0.8

## 2011-05-12 ENCOUNTER — Ambulatory Visit (HOSPITAL_BASED_OUTPATIENT_CLINIC_OR_DEPARTMENT_OTHER): Payer: Medicare Other

## 2011-05-12 ENCOUNTER — Other Ambulatory Visit (HOSPITAL_BASED_OUTPATIENT_CLINIC_OR_DEPARTMENT_OTHER): Payer: Medicare Other | Admitting: Lab

## 2011-05-12 VITALS — BP 110/74 | HR 96 | Temp 98.1°F

## 2011-05-12 DIAGNOSIS — D708 Other neutropenia: Secondary | ICD-10-CM

## 2011-05-12 DIAGNOSIS — D704 Cyclic neutropenia: Secondary | ICD-10-CM

## 2011-05-12 DIAGNOSIS — M359 Systemic involvement of connective tissue, unspecified: Secondary | ICD-10-CM

## 2011-05-12 LAB — CBC WITH DIFFERENTIAL/PLATELET
BASO%: 0.4 % (ref 0.0–2.0)
Eosinophils Absolute: 0 10*3/uL (ref 0.0–0.5)
MCHC: 34.1 g/dL (ref 31.5–36.0)
MONO#: 0.1 10*3/uL (ref 0.1–0.9)
MONO%: 1.9 % (ref 0.0–14.0)
NEUT#: 3.7 10*3/uL (ref 1.5–6.5)
RBC: 3.67 10*6/uL — ABNORMAL LOW (ref 3.70–5.45)
RDW: 16.9 % — ABNORMAL HIGH (ref 11.2–14.5)
WBC: 4.4 10*3/uL (ref 3.9–10.3)

## 2011-05-12 MED ORDER — FILGRASTIM 480 MCG/0.8ML IJ SOLN
480.0000 ug | Freq: Once | INTRAMUSCULAR | Status: AC
  Administered 2011-05-12: 480 ug via SUBCUTANEOUS
  Filled 2011-05-12: qty 0.8

## 2011-05-14 ENCOUNTER — Ambulatory Visit (HOSPITAL_BASED_OUTPATIENT_CLINIC_OR_DEPARTMENT_OTHER): Payer: Medicare Other

## 2011-05-14 VITALS — BP 108/75 | HR 94 | Temp 98.1°F

## 2011-05-14 DIAGNOSIS — M359 Systemic involvement of connective tissue, unspecified: Secondary | ICD-10-CM

## 2011-05-14 DIAGNOSIS — D708 Other neutropenia: Secondary | ICD-10-CM

## 2011-05-14 MED ORDER — FILGRASTIM 480 MCG/0.8ML IJ SOLN
480.0000 ug | Freq: Once | INTRAMUSCULAR | Status: AC
  Administered 2011-05-14: 480 ug via SUBCUTANEOUS
  Filled 2011-05-14: qty 0.8

## 2011-05-15 ENCOUNTER — Other Ambulatory Visit: Payer: Medicare Other

## 2011-05-15 ENCOUNTER — Other Ambulatory Visit: Payer: Self-pay | Admitting: Gastroenterology

## 2011-05-15 DIAGNOSIS — Z1211 Encounter for screening for malignant neoplasm of colon: Secondary | ICD-10-CM

## 2011-05-15 LAB — HEMOCCULT SLIDES (X 3 CARDS)
OCCULT 1: NEGATIVE
OCCULT 2: NEGATIVE
OCCULT 4: NEGATIVE
OCCULT 5: NEGATIVE

## 2011-05-16 ENCOUNTER — Ambulatory Visit (INDEPENDENT_AMBULATORY_CARE_PROVIDER_SITE_OTHER)
Admission: RE | Admit: 2011-05-16 | Discharge: 2011-05-16 | Disposition: A | Discharge status: Home / Self Care | Payer: Medicare Other | Source: Ambulatory Visit | Attending: Internal Medicine | Admitting: Internal Medicine

## 2011-05-16 ENCOUNTER — Ambulatory Visit (HOSPITAL_BASED_OUTPATIENT_CLINIC_OR_DEPARTMENT_OTHER): Payer: Medicare Other

## 2011-05-16 VITALS — BP 109/68 | HR 77 | Temp 98.1°F

## 2011-05-16 DIAGNOSIS — R918 Other nonspecific abnormal finding of lung field: Secondary | ICD-10-CM

## 2011-05-16 DIAGNOSIS — M359 Systemic involvement of connective tissue, unspecified: Secondary | ICD-10-CM

## 2011-05-16 DIAGNOSIS — R222 Localized swelling, mass and lump, trunk: Secondary | ICD-10-CM

## 2011-05-16 DIAGNOSIS — D708 Other neutropenia: Secondary | ICD-10-CM

## 2011-05-16 DIAGNOSIS — D704 Cyclic neutropenia: Secondary | ICD-10-CM

## 2011-05-16 MED ORDER — FILGRASTIM 480 MCG/0.8ML IJ SOLN
480.0000 ug | Freq: Once | INTRAMUSCULAR | Status: AC
  Administered 2011-05-16: 480 ug via SUBCUTANEOUS
  Filled 2011-05-16: qty 0.8

## 2011-05-16 NOTE — Progress Notes (Signed)
Quick Note:  Please inform the patient that hemeoccults were negative and to continue current plan of action  Cc Drs. Mckenzie and Murinson ______

## 2011-05-19 ENCOUNTER — Other Ambulatory Visit (HOSPITAL_BASED_OUTPATIENT_CLINIC_OR_DEPARTMENT_OTHER): Payer: Medicare Other

## 2011-05-19 ENCOUNTER — Ambulatory Visit (HOSPITAL_BASED_OUTPATIENT_CLINIC_OR_DEPARTMENT_OTHER): Payer: Medicare Other

## 2011-05-19 ENCOUNTER — Telehealth: Payer: Self-pay

## 2011-05-19 VITALS — BP 140/85 | HR 82 | Temp 98.2°F

## 2011-05-19 DIAGNOSIS — D708 Other neutropenia: Secondary | ICD-10-CM

## 2011-05-19 DIAGNOSIS — M359 Systemic involvement of connective tissue, unspecified: Secondary | ICD-10-CM

## 2011-05-19 DIAGNOSIS — D649 Anemia, unspecified: Secondary | ICD-10-CM

## 2011-05-19 LAB — CBC WITH DIFFERENTIAL/PLATELET
Basophils Absolute: 0 10*3/uL (ref 0.0–0.1)
EOS%: 0.8 % (ref 0.0–7.0)
Eosinophils Absolute: 0 10*3/uL (ref 0.0–0.5)
HCT: 33.7 % — ABNORMAL LOW (ref 34.8–46.6)
HGB: 11.5 g/dL — ABNORMAL LOW (ref 11.6–15.9)
MCH: 33.5 pg (ref 25.1–34.0)
MONO#: 0.3 10*3/uL (ref 0.1–0.9)
NEUT#: 5 10*3/uL (ref 1.5–6.5)
NEUT%: 79.4 % — ABNORMAL HIGH (ref 38.4–76.8)
RDW: 15.8 % — ABNORMAL HIGH (ref 11.2–14.5)
lymph#: 1 10*3/uL (ref 0.9–3.3)

## 2011-05-19 MED ORDER — FILGRASTIM 480 MCG/0.8ML IJ SOLN
480.0000 ug | Freq: Once | INTRAMUSCULAR | Status: AC
  Administered 2011-05-19: 480 ug via SUBCUTANEOUS
  Filled 2011-05-19: qty 0.8

## 2011-05-19 NOTE — Telephone Encounter (Signed)
Message copied by Michele Mcalpine on Mon May 19, 2011  3:05 PM ------      Message from: Melvia Heaps D      Created: Fri May 16, 2011  3:34 PM       Please inform the patient that hemeoccults were negative and to continue current plan of action            Cc Drs. Mckenzie and Murinson

## 2011-05-19 NOTE — Telephone Encounter (Signed)
Pt aware.

## 2011-05-21 ENCOUNTER — Telehealth: Payer: Self-pay | Admitting: Internal Medicine

## 2011-05-21 ENCOUNTER — Encounter: Payer: Medicare Other | Attending: Neurosurgery | Admitting: Neurosurgery

## 2011-05-21 ENCOUNTER — Ambulatory Visit (HOSPITAL_BASED_OUTPATIENT_CLINIC_OR_DEPARTMENT_OTHER): Payer: Medicare Other

## 2011-05-21 VITALS — BP 114/75 | HR 99 | Temp 98.1°F

## 2011-05-21 DIAGNOSIS — R269 Unspecified abnormalities of gait and mobility: Secondary | ICD-10-CM | POA: Insufficient documentation

## 2011-05-21 DIAGNOSIS — IMO0001 Reserved for inherently not codable concepts without codable children: Secondary | ICD-10-CM

## 2011-05-21 DIAGNOSIS — M069 Rheumatoid arthritis, unspecified: Secondary | ICD-10-CM

## 2011-05-21 DIAGNOSIS — M719 Bursopathy, unspecified: Secondary | ICD-10-CM | POA: Insufficient documentation

## 2011-05-21 DIAGNOSIS — M25529 Pain in unspecified elbow: Secondary | ICD-10-CM

## 2011-05-21 DIAGNOSIS — M359 Systemic involvement of connective tissue, unspecified: Secondary | ICD-10-CM

## 2011-05-21 DIAGNOSIS — M67919 Unspecified disorder of synovium and tendon, unspecified shoulder: Secondary | ICD-10-CM | POA: Insufficient documentation

## 2011-05-21 DIAGNOSIS — D708 Other neutropenia: Secondary | ICD-10-CM

## 2011-05-21 MED ORDER — FILGRASTIM 480 MCG/0.8ML IJ SOLN
480.0000 ug | Freq: Once | INTRAMUSCULAR | Status: AC
  Administered 2011-05-21: 480 ug via SUBCUTANEOUS
  Filled 2011-05-21: qty 0.8

## 2011-05-21 NOTE — Telephone Encounter (Signed)
Called and spoke with pt. Informed her of ct results. Nothing further needed.

## 2011-05-22 ENCOUNTER — Ambulatory Visit (INDEPENDENT_AMBULATORY_CARE_PROVIDER_SITE_OTHER): Payer: Medicare Other | Admitting: Internal Medicine

## 2011-05-22 ENCOUNTER — Encounter: Payer: Self-pay | Admitting: Internal Medicine

## 2011-05-22 VITALS — BP 120/72 | HR 104 | Temp 99.1°F | Ht 62.5 in | Wt 173.4 lb

## 2011-05-22 DIAGNOSIS — R222 Localized swelling, mass and lump, trunk: Secondary | ICD-10-CM

## 2011-05-22 DIAGNOSIS — R918 Other nonspecific abnormal finding of lung field: Secondary | ICD-10-CM

## 2011-05-22 NOTE — Assessment & Plan Note (Signed)
This is a patient of Dr. Riley Kill, that is seen for history of rheumatoid arthritis as well as fibromyalgia.  She has a history of bilateral rotator cuff syndrome.  The patient rates her average pain unchanged at a 5.  It is a tingling, aching pain.  General activity level is 3-5. Pain is worse during the morning.  Sleep patterns are fair.  Pain is worse with bending, standing, and activity.  Rest, pacing, and medication tend to help.  She walks without assistance.  She can climb steps and drive.  She can walk about 20 minutes at a time. Functionally, she is on disability.  Needs help with ADLs and household duties.  REVIEW OF SYSTEMS:  Notable for difficulties as described above as well as some easy bleeding, trouble walking, dizziness, depression, anxiety. No suicidal thoughts or aberrant behaviors.  Last pill count and UDS consistent.  PAST MEDICAL HISTORY:  Unchanged.  SOCIAL HISTORY:  Unchanged.  FAMILY HISTORY:  Unchanged.  PHYSICAL EXAMINATION:  VITAL SIGNS:  Her blood pressure is 124/61, pulse 100, respirations are 14, O2 sats 96 on room air. NEUROLOGIC:  Her motor strength and sensation are intact. Constitutionally, she is within normal limits.  She is alert and oriented x3.  She has normal gait.  We did talk about her sleep pattern.  She states that she uses diazepam, but had rather be on the nortriptyline that she used to be on through her primary care when she was in the hospital.  Recently, they took off the nortriptyline as we could restart that for her, and I would rather see her take that and the Valium.  ASSESSMENT: 1. History of rheumatoid arthritis. 2. Fibromyalgia. 3. Bilateral rotator cuff syndrome. 4. Gait disorder.  PLAN: 1. Start nortriptyline 75 mg 1 p.o. at bedtime, #30 with 2 refills. 2. Fentanyl 50 mcg 1 transdermally every 72 hours, #10 with no refill.     Her questions were encouraged and answered.  We will see her back     in a  month.     Hatsuko Bizzarro L. Blima Dessert Electronically Signed    RLW/MedQ D:  05/21/2011 10:10:14  T:  05/22/2011 00:45:33  Job #:  [id number]

## 2011-05-22 NOTE — Progress Notes (Signed)
Subjective:    Patient ID: [name], female    DOB: 1951/02/26, 60 y.o.   MRN: [id number]  HPI PI  60 year old female.Ex-smoker. RA on chronic prednisone. Patient of Dr. 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 Husband Dakiyah Heinke who sees me for asthma. CT abg lung cut in July 2012 for diverticular symptoms showed rt lung mass. Followup CT chest as outpatient 01/13/2011 which shows lung RML 4.6 irregular lung mass that abuts visceral pleura with associated lung collapse. There is also 2.3 subcarinal node. Otherwise negative. These are new since CT chest Nov 2006 and CXR May 2011   OV 02/20/11: She presents for followup of above. Husband with her. Son also present this time. Since above she got admitted 8/14 - 8/17 with C diff colitis. During this time PET scan 01/22/11: PET HOT SUV 10 with worsening size of mass and 9 x 6cm size with development of new Rt pleural effusion in a week . S/p bronch by Dr Sherene Sires 01/23/11: RML orifice noted to be slit like with difficulty to wedge for BAL (concernn for RML syndrome raised) and transbronhical biopsy was non-diagnostic for malignancy. Dishargeed on avelox and flagyl. She was then readmitted 02/11/11 to 02/18/11 for neutropenic fever; presumed source was RML mass/infiltrate. Rx in hospital for 7 days with vanc/zosyn. Of note, on 02/12/11 - 2 days later 400cc thoracentesis done (fluid showed atypical cells and mesothelial cells, no glucse or other workup sent). Currently on neupogen but not on antibiotics  Currently reporting persistence of chroni cough (mild mpderate) and some green sputum for several days but no fever. Fatigue persists. No dyspnea. No associated chest pain, fever, chills, weight loss. In addition, is having nocturia and polyria. She is due to see Dr Dierdre Forth of rheum (? restablisshin with rheum after a while)  She and family very worried about symptoms.  She is keen to have lobectomy in case cancer or RML syndrome is etiology   REC  Please have CT scan chest without contrast for followup around 03/15/11 - few days before or or after ( I am off week of 03/14/11 for recert-exams)  I will see you after that  If my appointment is much after the CT, I will call you with results  I will talk to Dr Dierdre Forth about your rheumatoid status  I will let Dr Ronne Binning know about your urination problem   OV 05/22/2011  Followup for right middle lobe mass.. since last visit ahe had CT chest and cxr 02/24/11 in the emergency room- PE ruled out (why, what happened? That infor is not in echart). The CT does show RML mass is smaller in size. Therefore we decided No need for cT chest in oct 2012 but needs repeat CT chest wihtout contrast 2.5 months from 02/24/11 to ensure resolution. So she had the CT 05/16/2011 and is here for followup today. CT scan shows a continued improvement of the right middle lobe consolidation/mass. 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  She feels much improved. Current functional status is excellent.  Past, Family, Social reviewed: Her thrombocytopenia and neutropenia is much better and so his anemia according to her history. Energy levels are much improved and she is quite functional. Her husband Mr. Johnella Crumm suffered a TIA and is having memory deficits since last visit. This is a new finding for him. Otherwise no  change   Review of Systems  Constitutional: Negative for fever and unexpected weight change.  HENT: Negative for sore throat, rhinorrhea, sneezing, trouble swallowing, dental problem, postnasal drip and sinus pressure.   Eyes: Negative for redness and itching.  Respiratory: Positive for cough. Negative for shortness of breath and wheezing.   Cardiovascular: Positive for leg swelling.  Gastrointestinal: Negative for nausea and vomiting.  Genitourinary: Negative  for dysuria.  Musculoskeletal: Positive for joint swelling.  Skin: Negative for rash.  Neurological: Negative for headaches.  Hematological: Bruises/bleeds easily.  Psychiatric/Behavioral: Positive for dysphoric mood. The patient is nervous/anxious.        Objective:   Physical Exam  Vitals reviewed. Constitutional: She is oriented to person, place, and time. She appears well-developed and well-nourished. No distress.       Obese  HENT:  Head: Normocephalic and atraumatic.  Right Ear: External ear normal.  Left Ear: External ear normal.  Mouth/Throat: Oropharynx is clear and moist. No oropharyngeal exudate.  Eyes: Conjunctivae and EOM are normal. Pupils are equal, round, and reactive to light. Right eye exhibits no discharge. Left eye exhibits no discharge. No scleral icterus.  Neck: Normal range of motion. Neck supple. No JVD present. No tracheal deviation present. No thyromegaly present.  Cardiovascular: Normal rate, regular rhythm, normal heart sounds and intact distal pulses.  Exam reveals no gallop and no friction rub.   No murmur heard. Pulmonary/Chest: Effort normal and breath sounds normal. No respiratory distress. She has no wheezes. She has no rales. She exhibits no tenderness.  Abdominal: Soft. Bowel sounds are normal. She exhibits no distension and no mass. There is no tenderness. There is no rebound and no guarding.  Musculoskeletal: Normal range of motion. She exhibits no edema and no tenderness.       Evidence of rheumatoid arthritis  Lymphadenopathy:    She has no cervical adenopathy.  Neurological: She is alert and oriented to person, place, and time. She has normal reflexes. No cranial nerve deficit. She exhibits normal muscle tone. Coordination normal.  Skin: Skin is warm and dry. No rash noted. She is not diaphoretic. No erythema. No pallor.  Psychiatric: She has a normal mood and affect. Her behavior is normal. Judgment and thought content normal.       Previous  flat affect resolved          Assessment & Plan:

## 2011-05-22 NOTE — Progress Notes (Signed)
Subjective:    Patient ID: [name], female    DOB: 20-May-1951, 60 y.o.   MRN: [id number]  HPI  60 year old female.Ex-smoker. RA on chronic prednisone. Patient of Dr. Arline Knox, with   history of autoimmune leukopenia and thrombocytopenia dating back to 10  Years but labs reportedly okay early 2012. Husband [name] who sees me for asthma.  CT abg lung cut in July 2012 for diverticular symptoms showed rt lung mass. 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 There is also 2.3 subcarinal node. Otherwise negative. These are new since CT chest Nov 2006 and CXR May 2011   OV 02/20/11: She presents for followup of above. Husband with her. Son also present this time. Since above she got admitted 8/14 - 8/17 with C diff colitis. During this time PET scan 01/22/11: PET HOT SUV 10 with worsening size of mass and 9 x 6cm size with development of new Rt pleural effusion in a week . S/p bronch by Dr [name] 01/23/11: RML orifice noted to be slit like with difficulty to wedge for BAL (concernn for RML syndrome raised) and transbronhical biopsy was non-diagnostic for malignancy. Dishargeed on avelox and flagyl. She was then readmitted 02/11/11 to 02/18/11 for neutropenic fever; presumed source was RML mass/infiltrate. Rx in hospital for 7 days with vanc/zosyn. Of note, on 02/12/11 - 2 days later 400cc thoracentesis done (fluid showed atypical cells and mesothelial cells, no glucse or other workup sent). Currently on neupogen but not on antibiotics  Currently reporting persistence of chroni cough (mild mpderate) and some green sputum for several days but no fever. Fatigue persists. No dyspnea.  No associated chest pain, fever, chills, weight loss. In addition, is having nocturia and polyria. She is due to see Dr [name] of rheum (? restablisshin with rheum after a while)  She and family very worried about  symptoms. She is keen to have lobectomy in case cancer or RML syndrome is etiology   REC Please have CT scan chest without contrast for followup around 03/15/11 - few days before or or after ( I am off week of 03/14/11 for recert-exams) I will see you after that If my appointment is much after the CT, I will call you with results I will talk to Dr [name] about your rheumatoid status I will let Dr [name] know about your urination problem  OV 05/22/2011 she had CT chest and cxr 02/24/11 - PE ruled out (why, what happened? That infor is not in echart). The CT does show RML mass is smaller in size. Therefore  No need for cT chest in oct 2012 but needs repeat CT chest  wihtout contrast 2.5 months from 02/24/11 to ensure resolution. If she wants she can see me at that time but if she wants to come and see me in October is fine too  CT-scan of the chest  Review of Systems  Constitutional: Negative for fever and unexpected weight change.  HENT: Negative for ear pain, nosebleeds, congestion, sore throat, rhinorrhea, sneezing, trouble swallowing, dental problem, postnasal drip and sinus pressure.   Eyes: Negative for redness and itching.  Respiratory: Negative for cough, chest tightness, shortness of breath and wheezing.   Cardiovascular: Negative for palpitations and leg swelling.  Gastrointestinal: Negative for nausea and vomiting.  Genitourinary: Negative for dysuria.  Musculoskeletal: Negative for joint swelling.  Skin: Negative for rash.  Neurological: Negative for  headaches.  Hematological: Does not bruise/bleed easily.  Psychiatric/Behavioral: Negative for dysphoric mood. The patient is not nervous/anxious.        Objective:   Physical Exam        Assessment & Plan:

## 2011-05-22 NOTE — Patient Instructions (Signed)
You have right middle lobe syndrome CT chest 05/16/11 is much improved Please have CT scan chest 4-5 months from now Will call you with result Anytime you have pneumonia symptoms please call us or primary care doctor immediately or visit [place]

## 2011-05-23 ENCOUNTER — Encounter: Payer: Self-pay | Admitting: Internal Medicine

## 2011-05-23 ENCOUNTER — Ambulatory Visit (HOSPITAL_BASED_OUTPATIENT_CLINIC_OR_DEPARTMENT_OTHER): Payer: Medicare Other

## 2011-05-23 VITALS — BP 130/85 | HR 95 | Temp 97.9°F

## 2011-05-23 DIAGNOSIS — M359 Systemic involvement of connective tissue, unspecified: Secondary | ICD-10-CM

## 2011-05-23 DIAGNOSIS — D704 Cyclic neutropenia: Secondary | ICD-10-CM

## 2011-05-23 DIAGNOSIS — D708 Other neutropenia: Secondary | ICD-10-CM

## 2011-05-23 MED ORDER — FILGRASTIM 480 MCG/0.8ML IJ SOLN
480.0000 ug | Freq: Once | INTRAMUSCULAR | Status: AC
  Administered 2011-05-23: 480 ug via SUBCUTANEOUS
  Filled 2011-05-23: qty 0.8

## 2011-05-23 NOTE — Assessment & Plan Note (Signed)
You have right middle lobe syndrome CT chest 05/16/11 is much improved Please have CT scan chest 4-5 months from now Will call you with result Anytime you have pneumonia symptoms please call us or primary care doctor immediately or visit us 

## 2011-05-26 ENCOUNTER — Other Ambulatory Visit (HOSPITAL_COMMUNITY): Payer: Self-pay | Admitting: Oncology

## 2011-05-26 ENCOUNTER — Ambulatory Visit (HOSPITAL_BASED_OUTPATIENT_CLINIC_OR_DEPARTMENT_OTHER): Payer: Medicare Other | Admitting: Oncology

## 2011-05-26 ENCOUNTER — Telehealth: Payer: Self-pay | Admitting: Oncology

## 2011-05-26 ENCOUNTER — Other Ambulatory Visit (HOSPITAL_BASED_OUTPATIENT_CLINIC_OR_DEPARTMENT_OTHER): Payer: Medicare Other

## 2011-05-26 DIAGNOSIS — D649 Anemia, unspecified: Secondary | ICD-10-CM

## 2011-05-26 DIAGNOSIS — D708 Other neutropenia: Secondary | ICD-10-CM

## 2011-05-26 DIAGNOSIS — M069 Rheumatoid arthritis, unspecified: Secondary | ICD-10-CM

## 2011-05-26 DIAGNOSIS — M0500 Felty's syndrome, unspecified site: Secondary | ICD-10-CM

## 2011-05-26 DIAGNOSIS — R918 Other nonspecific abnormal finding of lung field: Secondary | ICD-10-CM

## 2011-05-26 DIAGNOSIS — D704 Cyclic neutropenia: Secondary | ICD-10-CM

## 2011-05-26 LAB — CBC WITH DIFFERENTIAL/PLATELET
Basophils Absolute: 0 10*3/uL (ref 0.0–0.1)
EOS%: 0.6 % (ref 0.0–7.0)
Eosinophils Absolute: 0.1 10*3/uL (ref 0.0–0.5)
HCT: 37.5 % (ref 34.8–46.6)
HGB: 12.8 g/dL (ref 11.6–15.9)
LYMPH%: 12.6 % — ABNORMAL LOW (ref 14.0–49.7)
MCH: 33.5 pg (ref 25.1–34.0)
MCV: 98.1 fL (ref 79.5–101.0)
MONO%: 3.2 % (ref 0.0–14.0)
NEUT#: 7 10*3/uL — ABNORMAL HIGH (ref 1.5–6.5)
NEUT%: 83.3 % — ABNORMAL HIGH (ref 38.4–76.8)
Platelets: 198 10*3/uL (ref 145–400)
RDW: 15.2 % — ABNORMAL HIGH (ref 11.2–14.5)

## 2011-05-26 LAB — COMPREHENSIVE METABOLIC PANEL
ALT: 12 U/L (ref 0–35)
Alkaline Phosphatase: 82 U/L (ref 39–117)
CO2: 32 mEq/L (ref 19–32)
Creatinine, Ser: 0.82 mg/dL (ref 0.50–1.10)
Sodium: 142 mEq/L (ref 135–145)
Total Bilirubin: 0.6 mg/dL (ref 0.3–1.2)
Total Protein: 7.2 g/dL (ref 6.0–8.3)

## 2011-05-26 LAB — SEDIMENTATION RATE: Sed Rate: 12 mm/hr (ref 0–22)

## 2011-05-26 MED ORDER — FILGRASTIM 480 MCG/0.8ML IJ SOLN
480.0000 ug | Freq: Once | INTRAMUSCULAR | Status: AC
  Administered 2011-05-26: 480 ug via SUBCUTANEOUS
  Filled 2011-05-26: qty 0.8

## 2011-05-26 NOTE — Progress Notes (Signed)
CC:   Thayer Headings, M.D. Ollen Gross, M.D. Barbette Hair. 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  Ms. Dralle has been doing well since her last visit here on 04/25/2011.  At that time, we had reduced her prednisone from 30 mg daily to 20 mg daily.  We may also have reduced her Neupogen from daily treatments to Monday, Wednesdays and Fridays with a dose of 480 mcg.  These injections are given here at the [facility] because of insurance considerations.  Fortunately, Ms. Dascenzo has done well.  There has been no fever or infections, any sense of ill health.  Her arthritis seems to be under reasonably good control.  She is having some pain in her shoulders which may be due to degenerative disease.  Since her last visit here, the patient did undergo a CT scan of the chest without IV contrast, ordered by Dr. Marchelle Gearing to follow up on the right middle lobe infiltrate.  That study was carried out on May 16, 2011 and apparently showed some slight improvement according to the report.  The report reads that the right middle lobe air space consolidation containing air bronchograms is again noted.  This measures 2.6 cm in thickness and appears slightly improved from prior study.  It was suggested that a followup scan be obtained and I believe that is scheduled for May.  There is no endobronchial lesion identified.  The patient seems to be asymptomatic.  PROBLEM LIST: 1. 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  The patient has had bone marrow on 06/19/2000 and     02/16/2003 that failed to show any bone marrow abnormalities. 2. History of rheumatoid arthritis with positive rheumatoid factor,     antineutrophil antibodies with diagnosis going back  to 1986,     fibromyalgia date onset 1993, history of GI bleeding in the fall of     2012 with negative endoscopy and colonoscopy by Dr. Melvia Heaps     on 04/22/2011.  Stools on 05/15/2011 were negative x5 and the     patient denies any evidence of blood or melena currently. 3. Right middle lobe consolidation noted in September 2012 with most     recent CT scan of the chest carried out on 05/16/2011 to be     followed up with CT scan in May 2013.  MEDICINES:  Reviewed and recorded.  Include the following:  Calcium carbonate; vitamin D; copper gluconate; Flexeril; Cymbalta; Valium; Duragesic patch 50 mcg/hour, change every 3 days; Neupogen currently 480 mcg subcu given Monday, Wednesdays and Fridays at the [facility]; Lorcet Plus 7.5/650 as needed; Lyrica;Pamelor;Prilosec;prednisone currently 20 mg daily, to be decreased to 20 mg alternating with 10 mg; probiotic; Ultram; and vitamin B12 tablets.  PHYSICAL EXAM:  Ms. Altizer looks well.  She still looks a little cushingoid to me.  Weight is 169 pounds, height 5 feet 2-1/2 inches, body surface area 1.84 meters square.  Blood pressure 99/73.  Other vital signs are normal.  She is afebrile.  As stated, she has had no febrile illnesses.  No signs of infection.  No scleral icterus.  Mouth and pharynx are benign.  No peripheral adenopathy palpable. Heart/lungs:  Normal.  No port or central catheters.  Abdomen:  Obese, nontender with no organomegaly, masses  palpable.  Extremities:  No peripheral edema or clubbing.  The patient has a lot of purpura and petechial lesions over the upper extremities as previously noted.  LABORATORY DATA:  Today, white count 8.4, ANC 7.0, hemoglobin 12.8, hematocrit 37.5, platelets of 198,000.  Sedimentation rate and chemistries are pending.  Chemistries from 04/21/2011 were normal except for glucose of 135.  LDH was 178, albumin 3.7, sedimentation rate on 11/12 was 40.  She had vitamin B12 level on  03/07/2011 was over 2000.  A CT scan of the chest without IV contrast was described above, basically shows some improvement and followup exam is suggested.  IMPRESSION AND PLAN:  I am happy that Ms. Strider seems to be doing well clinically.  In addition, her blood counts have been stable.  We have been checking them on a weekly basis. We will make a slight decrease in her prednisone dose from 20 mg daily to 20 mg alternating with 10 mg.  We will continue Neupogen 480 mcg subcu Monday, Wednesdays and Fridays.  The patient will continue to receive this here at the [facility].  We will check weekly blood counts and plan to see Ms. Brines again in 4 weeks which will be January 14th at which time we will check CBC, chemistries and sedimentation rate.  Hopefully, we will continue to drop the prednisone dose.  I would like to get the patient on every-other-day schedule. Thereafter we may try to wean her from the Neupogen but we will do this very carefully.    ______________________________ Samul Dada, M.D. DSM/MEDQ  D:  05/26/2011  T:  05/26/2011  Job:  [id number]

## 2011-05-26 NOTE — Telephone Encounter (Signed)
gv pt appt schedule for dec/jan °

## 2011-05-26 NOTE — Progress Notes (Signed)
This office note has been dictated.  #[id number]

## 2011-05-28 ENCOUNTER — Ambulatory Visit (HOSPITAL_BASED_OUTPATIENT_CLINIC_OR_DEPARTMENT_OTHER): Payer: Medicare Other

## 2011-05-28 VITALS — BP 110/74 | HR 99 | Temp 98.9°F

## 2011-05-28 DIAGNOSIS — D704 Cyclic neutropenia: Secondary | ICD-10-CM

## 2011-05-28 DIAGNOSIS — D709 Neutropenia, unspecified: Secondary | ICD-10-CM

## 2011-05-28 DIAGNOSIS — M359 Systemic involvement of connective tissue, unspecified: Secondary | ICD-10-CM

## 2011-05-28 MED ORDER — FILGRASTIM 480 MCG/0.8ML IJ SOLN
480.0000 ug | Freq: Once | INTRAMUSCULAR | Status: AC
  Administered 2011-05-28: 480 ug via SUBCUTANEOUS
  Filled 2011-05-28: qty 0.8

## 2011-05-30 ENCOUNTER — Ambulatory Visit (HOSPITAL_BASED_OUTPATIENT_CLINIC_OR_DEPARTMENT_OTHER): Payer: Medicare Other

## 2011-05-30 VITALS — BP 114/78 | HR 113 | Temp 98.6°F

## 2011-05-30 DIAGNOSIS — D704 Cyclic neutropenia: Secondary | ICD-10-CM

## 2011-05-30 DIAGNOSIS — D709 Neutropenia, unspecified: Secondary | ICD-10-CM

## 2011-05-30 DIAGNOSIS — M359 Systemic involvement of connective tissue, unspecified: Secondary | ICD-10-CM

## 2011-05-30 MED ORDER — FILGRASTIM 480 MCG/0.8ML IJ SOLN
480.0000 ug | Freq: Once | INTRAMUSCULAR | Status: AC
  Administered 2011-05-30: 480 ug via SUBCUTANEOUS
  Filled 2011-05-30: qty 0.8

## 2011-06-01 ENCOUNTER — Encounter (HOSPITAL_COMMUNITY): Payer: Self-pay | Admitting: Oncology

## 2011-06-01 NOTE — Progress Notes (Unsigned)
CT chest from 05/16/11 reviewed.  PET scan from 01/22/11 showed RML hypermetabolic process.  CT angio chest from 02/24/11 also available for comparison.  Present study looks more like scarring to me--no actual mass.  Pt to have repeat chest CT scan in May 2013.

## 2011-06-02 ENCOUNTER — Other Ambulatory Visit (HOSPITAL_BASED_OUTPATIENT_CLINIC_OR_DEPARTMENT_OTHER): Payer: Medicare Other | Admitting: Lab

## 2011-06-02 ENCOUNTER — Ambulatory Visit (HOSPITAL_BASED_OUTPATIENT_CLINIC_OR_DEPARTMENT_OTHER): Payer: Medicare Other

## 2011-06-02 VITALS — BP 126/87 | HR 116 | Temp 99.4°F

## 2011-06-02 DIAGNOSIS — D704 Cyclic neutropenia: Secondary | ICD-10-CM

## 2011-06-02 DIAGNOSIS — M0500 Felty's syndrome, unspecified site: Secondary | ICD-10-CM

## 2011-06-02 DIAGNOSIS — D708 Other neutropenia: Secondary | ICD-10-CM

## 2011-06-02 DIAGNOSIS — M359 Systemic involvement of connective tissue, unspecified: Secondary | ICD-10-CM

## 2011-06-02 LAB — CBC WITH DIFFERENTIAL/PLATELET
Eosinophils Absolute: 0 10*3/uL (ref 0.0–0.5)
HCT: 43.1 % (ref 34.8–46.6)
LYMPH%: 30.6 % (ref 14.0–49.7)
MCHC: 33.8 g/dL (ref 31.5–36.0)
MCV: 97.8 fL (ref 79.5–101.0)
MONO#: 0.3 10*3/uL (ref 0.1–0.9)
MONO%: 3.9 % (ref 0.0–14.0)
NEUT#: 5.4 10*3/uL (ref 1.5–6.5)
NEUT%: 64.7 % (ref 38.4–76.8)
Platelets: 155 10*3/uL (ref 145–400)
RBC: 4.41 10*6/uL (ref 3.70–5.45)
WBC: 8.3 10*3/uL (ref 3.9–10.3)

## 2011-06-02 MED ORDER — FILGRASTIM 480 MCG/0.8ML IJ SOLN
480.0000 ug | Freq: Once | INTRAMUSCULAR | Status: AC
  Administered 2011-06-02: 480 ug via SUBCUTANEOUS
  Filled 2011-06-02: qty 0.8

## 2011-06-04 ENCOUNTER — Ambulatory Visit (HOSPITAL_BASED_OUTPATIENT_CLINIC_OR_DEPARTMENT_OTHER): Payer: Medicare Other

## 2011-06-04 VITALS — BP 105/69 | HR 96 | Temp 97.1°F

## 2011-06-04 DIAGNOSIS — D708 Other neutropenia: Secondary | ICD-10-CM

## 2011-06-04 DIAGNOSIS — M359 Systemic involvement of connective tissue, unspecified: Secondary | ICD-10-CM

## 2011-06-04 MED ORDER — FILGRASTIM 480 MCG/0.8ML IJ SOLN
480.0000 ug | Freq: Once | INTRAMUSCULAR | Status: AC
  Administered 2011-06-04: 480 ug via SUBCUTANEOUS
  Filled 2011-06-04: qty 0.8

## 2011-06-06 ENCOUNTER — Ambulatory Visit (HOSPITAL_BASED_OUTPATIENT_CLINIC_OR_DEPARTMENT_OTHER): Payer: Medicare Other

## 2011-06-06 VITALS — BP 134/87 | HR 103 | Temp 98.9°F

## 2011-06-06 DIAGNOSIS — D704 Cyclic neutropenia: Secondary | ICD-10-CM

## 2011-06-06 DIAGNOSIS — D708 Other neutropenia: Secondary | ICD-10-CM

## 2011-06-06 MED ORDER — FILGRASTIM 480 MCG/0.8ML IJ SOLN
480.0000 ug | Freq: Once | INTRAMUSCULAR | Status: AC
  Administered 2011-06-06: 480 ug via SUBCUTANEOUS
  Filled 2011-06-06: qty 0.8

## 2011-06-09 ENCOUNTER — Other Ambulatory Visit: Payer: Self-pay | Admitting: Oncology

## 2011-06-09 ENCOUNTER — Other Ambulatory Visit (HOSPITAL_BASED_OUTPATIENT_CLINIC_OR_DEPARTMENT_OTHER): Payer: Medicare Other | Admitting: Lab

## 2011-06-09 ENCOUNTER — Ambulatory Visit (HOSPITAL_BASED_OUTPATIENT_CLINIC_OR_DEPARTMENT_OTHER): Payer: Medicare Other

## 2011-06-09 DIAGNOSIS — D709 Neutropenia, unspecified: Secondary | ICD-10-CM

## 2011-06-09 DIAGNOSIS — M0500 Felty's syndrome, unspecified site: Secondary | ICD-10-CM

## 2011-06-09 DIAGNOSIS — D704 Cyclic neutropenia: Secondary | ICD-10-CM

## 2011-06-09 LAB — CBC WITH DIFFERENTIAL/PLATELET
BASO%: 0.7 % (ref 0.0–2.0)
EOS%: 0.8 % (ref 0.0–7.0)
HCT: 38.4 % (ref 34.8–46.6)
LYMPH%: 23.9 % (ref 14.0–49.7)
MCH: 33.7 pg (ref 25.1–34.0)
MCHC: 34.6 g/dL (ref 31.5–36.0)
MONO%: 9.2 % (ref 0.0–14.0)
NEUT%: 65.4 % (ref 38.4–76.8)
Platelets: 196 10*3/uL (ref 145–400)
RBC: 3.95 10*6/uL (ref 3.70–5.45)

## 2011-06-09 MED ORDER — FILGRASTIM 480 MCG/0.8ML IJ SOLN
480.0000 ug | Freq: Once | INTRAMUSCULAR | Status: AC
  Administered 2011-06-09: 480 ug via SUBCUTANEOUS
  Filled 2011-06-09: qty 0.8

## 2011-06-11 ENCOUNTER — Ambulatory Visit (HOSPITAL_BASED_OUTPATIENT_CLINIC_OR_DEPARTMENT_OTHER): Payer: Medicare Other

## 2011-06-11 DIAGNOSIS — M0500 Felty's syndrome, unspecified site: Secondary | ICD-10-CM

## 2011-06-11 DIAGNOSIS — D708 Other neutropenia: Secondary | ICD-10-CM

## 2011-06-11 DIAGNOSIS — D704 Cyclic neutropenia: Secondary | ICD-10-CM

## 2011-06-11 MED ORDER — FILGRASTIM 480 MCG/0.8ML IJ SOLN
480.0000 ug | Freq: Once | INTRAMUSCULAR | Status: AC
  Administered 2011-06-11: 480 ug via SUBCUTANEOUS
  Filled 2011-06-11: qty 0.8

## 2011-06-13 ENCOUNTER — Ambulatory Visit (HOSPITAL_BASED_OUTPATIENT_CLINIC_OR_DEPARTMENT_OTHER): Payer: Medicare Other

## 2011-06-13 DIAGNOSIS — M0500 Felty's syndrome, unspecified site: Secondary | ICD-10-CM

## 2011-06-13 DIAGNOSIS — M359 Systemic involvement of connective tissue, unspecified: Secondary | ICD-10-CM

## 2011-06-13 DIAGNOSIS — D708 Other neutropenia: Secondary | ICD-10-CM

## 2011-06-13 MED ORDER — FILGRASTIM 480 MCG/0.8ML IJ SOLN
480.0000 ug | Freq: Once | INTRAMUSCULAR | Status: AC
  Administered 2011-06-13: 480 ug via SUBCUTANEOUS
  Filled 2011-06-13: qty 0.8

## 2011-06-16 ENCOUNTER — Other Ambulatory Visit (HOSPITAL_BASED_OUTPATIENT_CLINIC_OR_DEPARTMENT_OTHER): Payer: Medicare Other | Admitting: Lab

## 2011-06-16 ENCOUNTER — Ambulatory Visit (HOSPITAL_BASED_OUTPATIENT_CLINIC_OR_DEPARTMENT_OTHER): Payer: Medicare Other

## 2011-06-16 DIAGNOSIS — M0500 Felty's syndrome, unspecified site: Secondary | ICD-10-CM

## 2011-06-16 DIAGNOSIS — M359 Systemic involvement of connective tissue, unspecified: Secondary | ICD-10-CM

## 2011-06-16 DIAGNOSIS — D704 Cyclic neutropenia: Secondary | ICD-10-CM

## 2011-06-16 DIAGNOSIS — D708 Other neutropenia: Secondary | ICD-10-CM

## 2011-06-16 LAB — CBC WITH DIFFERENTIAL/PLATELET
Basophils Absolute: 0 10*3/uL (ref 0.0–0.1)
EOS%: 0.7 % (ref 0.0–7.0)
Eosinophils Absolute: 0.1 10*3/uL (ref 0.0–0.5)
HCT: 39.1 % (ref 34.8–46.6)
HGB: 13.6 g/dL (ref 11.6–15.9)
MONO#: 0.5 10*3/uL (ref 0.1–0.9)
NEUT#: 6.1 10*3/uL (ref 1.5–6.5)
NEUT%: 77.6 % — ABNORMAL HIGH (ref 38.4–76.8)
RDW: 13.8 % (ref 11.2–14.5)
WBC: 7.9 10*3/uL (ref 3.9–10.3)
lymph#: 1.2 10*3/uL (ref 0.9–3.3)

## 2011-06-16 MED ORDER — FILGRASTIM 480 MCG/0.8ML IJ SOLN
480.0000 ug | Freq: Once | INTRAMUSCULAR | Status: AC
  Administered 2011-06-16: 480 ug via SUBCUTANEOUS
  Filled 2011-06-16: qty 0.8

## 2011-06-18 ENCOUNTER — Ambulatory Visit (HOSPITAL_BASED_OUTPATIENT_CLINIC_OR_DEPARTMENT_OTHER): Payer: Medicare Other

## 2011-06-18 DIAGNOSIS — M359 Systemic involvement of connective tissue, unspecified: Secondary | ICD-10-CM

## 2011-06-18 DIAGNOSIS — D708 Other neutropenia: Secondary | ICD-10-CM

## 2011-06-18 DIAGNOSIS — M0500 Felty's syndrome, unspecified site: Secondary | ICD-10-CM

## 2011-06-18 MED ORDER — FILGRASTIM 480 MCG/0.8ML IJ SOLN
480.0000 ug | Freq: Once | INTRAMUSCULAR | Status: AC
  Administered 2011-06-18: 480 ug via SUBCUTANEOUS
  Filled 2011-06-18: qty 0.8

## 2011-06-19 ENCOUNTER — Encounter: Payer: Medicare Other | Attending: Neurosurgery | Admitting: Neurosurgery

## 2011-06-19 DIAGNOSIS — M67919 Unspecified disorder of synovium and tendon, unspecified shoulder: Secondary | ICD-10-CM | POA: Insufficient documentation

## 2011-06-19 DIAGNOSIS — M25529 Pain in unspecified elbow: Secondary | ICD-10-CM

## 2011-06-19 DIAGNOSIS — M719 Bursopathy, unspecified: Secondary | ICD-10-CM | POA: Insufficient documentation

## 2011-06-19 DIAGNOSIS — IMO0001 Reserved for inherently not codable concepts without codable children: Secondary | ICD-10-CM

## 2011-06-19 DIAGNOSIS — M069 Rheumatoid arthritis, unspecified: Secondary | ICD-10-CM | POA: Insufficient documentation

## 2011-06-19 DIAGNOSIS — R269 Unspecified abnormalities of gait and mobility: Secondary | ICD-10-CM | POA: Insufficient documentation

## 2011-06-20 ENCOUNTER — Ambulatory Visit: Payer: Medicare Other | Admitting: Neurosurgery

## 2011-06-20 ENCOUNTER — Ambulatory Visit: Payer: Medicare Other

## 2011-06-23 ENCOUNTER — Encounter: Payer: Self-pay | Admitting: Oncology

## 2011-06-23 ENCOUNTER — Ambulatory Visit (HOSPITAL_BASED_OUTPATIENT_CLINIC_OR_DEPARTMENT_OTHER): Payer: Medicare Other | Admitting: Oncology

## 2011-06-23 ENCOUNTER — Other Ambulatory Visit (HOSPITAL_BASED_OUTPATIENT_CLINIC_OR_DEPARTMENT_OTHER): Payer: Medicare Other | Admitting: Lab

## 2011-06-23 DIAGNOSIS — Z5189 Encounter for other specified aftercare: Secondary | ICD-10-CM

## 2011-06-23 DIAGNOSIS — D708 Other neutropenia: Secondary | ICD-10-CM

## 2011-06-23 DIAGNOSIS — D704 Cyclic neutropenia: Secondary | ICD-10-CM

## 2011-06-23 DIAGNOSIS — M0500 Felty's syndrome, unspecified site: Secondary | ICD-10-CM

## 2011-06-23 DIAGNOSIS — M359 Systemic involvement of connective tissue, unspecified: Secondary | ICD-10-CM

## 2011-06-23 LAB — CBC WITH DIFFERENTIAL/PLATELET
Basophils Absolute: 0 10*3/uL (ref 0.0–0.1)
Eosinophils Absolute: 0 10*3/uL (ref 0.0–0.5)
HGB: 13 g/dL (ref 11.6–15.9)
LYMPH%: 22.3 % (ref 14.0–49.7)
MCV: 96.1 fL (ref 79.5–101.0)
MONO#: 0.3 10*3/uL (ref 0.1–0.9)
MONO%: 5.9 % (ref 0.0–14.0)
NEUT#: 3.8 10*3/uL (ref 1.5–6.5)
Platelets: 207 10*3/uL (ref 145–400)
RBC: 3.92 10*6/uL (ref 3.70–5.45)
WBC: 5.4 10*3/uL (ref 3.9–10.3)

## 2011-06-23 LAB — COMPREHENSIVE METABOLIC PANEL
Albumin: 3.9 g/dL (ref 3.5–5.2)
BUN: 12 mg/dL (ref 6–23)
CO2: 28 mEq/L (ref 19–32)
Glucose, Bld: 122 mg/dL — ABNORMAL HIGH (ref 70–99)
Potassium: 3.6 mEq/L (ref 3.5–5.3)
Sodium: 141 mEq/L (ref 135–145)
Total Bilirubin: 0.5 mg/dL (ref 0.3–1.2)
Total Protein: 6.6 g/dL (ref 6.0–8.3)

## 2011-06-23 LAB — SEDIMENTATION RATE: Sed Rate: 14 mm/hr (ref 0–22)

## 2011-06-23 MED ORDER — FILGRASTIM 480 MCG/0.8ML IJ SOLN
480.0000 ug | Freq: Once | INTRAMUSCULAR | Status: AC
  Administered 2011-06-23: 480 ug via SUBCUTANEOUS
  Filled 2011-06-23: qty 0.8

## 2011-06-23 NOTE — Progress Notes (Signed)
CC:   Thayer Headings, M.D. Ollen Gross, M.D. Barbette Hair. Arlyce Dice, MD,FACG Kalman Shan, MD Alben Deeds, MD  HISTORY:  Ernestene Mention was seen today for followup of her autoimmune neutropenia which we feel is most likely due to Felty syndrome in association with rheumatoid arthritis.  Ms. Hartis was last seen by [place] on 05/26/2011.  Since that visit, she has been on prednisone 20 mg alternating with 10 mg and Neupogen 480 mcg Mondays, Wednesdays and Fridays.  She has done fairly well since her last visit  without any fever or signs of infection.  She has been receiving her Neupogen injections at the [facility] because of insurance considerations. She reports that her cough has improved.  She does produce some clear phlegm.  The patient has no sense of ill health.  She will be celebrating her 61st birthday on [birth year].  She and her husband would like to go to [place] to celebrate her birthday.  PROBLEM LIST: 1. 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  Bone marrow was carried out on 06/19/2000 and     again on 02/16/2003.  There were no abnormalities noted.  The bone     marrows were negative.  Currently responding to prednisone and neupogen. 2. History of rheumatoid arthritis with positive rheumatoid factor,     anti neutrophil antibodies with diagnosis going back to 1986. 3. Fibromyalgia with date of onset 1993. 4. History of GI bleeding in the fall of 2012 with negative endoscopy     and colonoscopy by Dr. Melvia Heaps on 04/22/2011.  Stools on     05/15/2011 were negative x5.  The patient denies any signs of GI     bleeding at the present time. 5. Right middle lobe consolidation noted in September 2012 with most     recent CT scan of the chest carried out on 05/16/2011.  These scans     showed some slight improvement as compared with the CT angiogram of     the chest carried out on 02/24/2011.  Followup CT scan of the chest  is apparently scheduled for May 2013. 6. Osteopenia. 7. History of porphyria cutanea tarda diagnosed in 1992. 8. Left total knee arthroplasty by Dr. Ollen Gross on 06/19/2008.  MEDICATIONS: 1. Calcium carbonate 600 mg twice a day. 2. Vitamin D 1000 units daily. 3. Copper gluconate 2 mg daily. 4. Flexeril 10 mg at bedtime. 5. Cymbalta 60 mg twice a day. 6. Duragesic patch 50 mcg/hour, change every 3 days. 7. Neupogen 480 mcg subcu Mondays, Wednesdays and Fridays to be     changed to twice a week on Mondays and Thursdays. 8. Vicodin 5/500 twice a day as needed for pain. 9. Lyrica 200 mg twice a day. 10.Pamelor 75 mg 1 at bedtime. 11.Prilosec 20 mg twice a day. 12.Prednisone currently 20 mg alternating with 10 mg daily. 13.Vitamin B12 1000 mcg daily. 14.Probiotic 1 capsule daily.  PHYSICAL EXAMINATION:  Mrs. Thomaston looks well.  I think she looks less cushingoid than she has in the past.  Weight is 174.9 pounds as compared with 169 pounds on 05/26/2011.  Height 5 feet 2-1/2 inches.  Body surface area 1.87 m squared.  Blood pressure 110/76.  Other vital signs are normal.  O2 saturation on room air at rest was 97%.  No scleral icterus.  Mouth and pharynx are benign.  No peripheral adenopathy palpable.  Heart/lungs:  Normal.  No Port-A-Cath or central catheters. Abdomen:  Obese,  nontender with no organomegaly or masses palpable. Ecchymoses are present from her Neupogen injections.  Extremities:  No peripheral edema or clubbing.  The patient has a lot of purpura over the upper extremities.  LABORATORY DATA:  Today white count 5.4, ANC 3.8, hemoglobin 13.0, hematocrit 37.7, platelets 207,000.  Chemistries and sedimentation rate today are pending.  Chemistries from 05/26/2011 were normal except for glucose of 111.  Sedimentation rate on 05/26/2011 was 12 as compared with 40 on 04/21/2011.  IMAGING STUDIES:  CT scan of the chest without IV contrast on 05/16/2011 showed slight  improvement in the right middle lobe consolidation containing air bronchograms.  No endobronchial lesion was identified. It was suggested that continued radiographic follow up to be carried out to ensure resolution.  I reviewed this CT scan as well as the previous scan of 02/28/2011 with the radiologist.  IMPRESSION AND PLAN:  Clinically Mrs. Sanpedro is doing well at present time.  We will reduce the Neupogen from 3 times a week to twice a week, primarily on Mondays and Thursdays.  The dose of 480 mcg will remain unchanged.  Because the patient will be going to [place], we will move the Thursday dose of 1/24 to Friday January 25th.  She will be away on the 28th and will be back on January 30th to receive her Neupogen dose. Thereafter we will go back to Monday and Thursday schedule, specifically February 4th and 7th.  We will be checking weekly CBCs on Wednesday. Will plan to see Ms. Alcaraz again on Monday February 11th at which time will check CBC, chemistries and sedimentation rate.  The dose of prednisone will remain the same at 20 mg alternating with 10 mg.  At some point, we hope to wean both of these drugs and get the patient on an every-other-day schedule of prednisone.    ______________________________ Samul Dada, M.D. DSM/MEDQ  D:  06/23/2011  T:  06/23/2011  Job:  [id number]

## 2011-06-23 NOTE — Progress Notes (Signed)
This office note has been dictated.  #[id number]

## 2011-06-25 ENCOUNTER — Ambulatory Visit (HOSPITAL_BASED_OUTPATIENT_CLINIC_OR_DEPARTMENT_OTHER): Payer: Medicare Other

## 2011-06-25 DIAGNOSIS — Z5189 Encounter for other specified aftercare: Secondary | ICD-10-CM

## 2011-06-25 DIAGNOSIS — M0500 Felty's syndrome, unspecified site: Secondary | ICD-10-CM

## 2011-06-25 DIAGNOSIS — M359 Systemic involvement of connective tissue, unspecified: Secondary | ICD-10-CM

## 2011-06-25 DIAGNOSIS — D708 Other neutropenia: Secondary | ICD-10-CM

## 2011-06-25 MED ORDER — FILGRASTIM 480 MCG/0.8ML IJ SOLN
480.0000 ug | Freq: Once | INTRAMUSCULAR | Status: AC
  Administered 2011-06-25: 480 ug via SUBCUTANEOUS
  Filled 2011-06-25: qty 0.8

## 2011-06-27 ENCOUNTER — Ambulatory Visit: Payer: Medicare Other

## 2011-06-30 ENCOUNTER — Ambulatory Visit (HOSPITAL_BASED_OUTPATIENT_CLINIC_OR_DEPARTMENT_OTHER): Payer: Medicare Other

## 2011-06-30 DIAGNOSIS — D704 Cyclic neutropenia: Secondary | ICD-10-CM

## 2011-06-30 DIAGNOSIS — M0500 Felty's syndrome, unspecified site: Secondary | ICD-10-CM

## 2011-06-30 DIAGNOSIS — D708 Other neutropenia: Secondary | ICD-10-CM

## 2011-06-30 MED ORDER — FILGRASTIM 480 MCG/0.8ML IJ SOLN
480.0000 ug | Freq: Once | INTRAMUSCULAR | Status: AC
  Administered 2011-06-30: 480 ug via SUBCUTANEOUS
  Filled 2011-06-30: qty 0.8

## 2011-07-02 ENCOUNTER — Other Ambulatory Visit (HOSPITAL_BASED_OUTPATIENT_CLINIC_OR_DEPARTMENT_OTHER): Payer: Medicare Other | Admitting: Lab

## 2011-07-02 DIAGNOSIS — M0500 Felty's syndrome, unspecified site: Secondary | ICD-10-CM

## 2011-07-02 DIAGNOSIS — D708 Other neutropenia: Secondary | ICD-10-CM

## 2011-07-02 DIAGNOSIS — D704 Cyclic neutropenia: Secondary | ICD-10-CM

## 2011-07-02 LAB — CBC WITH DIFFERENTIAL/PLATELET
Basophils Absolute: 0 10*3/uL (ref 0.0–0.1)
EOS%: 0.2 % (ref 0.0–7.0)
Eosinophils Absolute: 0 10*3/uL (ref 0.0–0.5)
HGB: 13.4 g/dL (ref 11.6–15.9)
MCV: 96.1 fL (ref 79.5–101.0)
MONO%: 3.6 % (ref 0.0–14.0)
NEUT#: 8.9 10*3/uL — ABNORMAL HIGH (ref 1.5–6.5)
RBC: 4.08 10*6/uL (ref 3.70–5.45)
RDW: 13.6 % (ref 11.2–14.5)
lymph#: 0.9 10*3/uL (ref 0.9–3.3)

## 2011-07-03 ENCOUNTER — Ambulatory Visit: Payer: Medicare Other

## 2011-07-03 ENCOUNTER — Ambulatory Visit (HOSPITAL_BASED_OUTPATIENT_CLINIC_OR_DEPARTMENT_OTHER): Payer: Medicare Other

## 2011-07-03 DIAGNOSIS — M0500 Felty's syndrome, unspecified site: Secondary | ICD-10-CM

## 2011-07-03 DIAGNOSIS — C9200 Acute myeloblastic leukemia, not having achieved remission: Secondary | ICD-10-CM

## 2011-07-03 DIAGNOSIS — Z5189 Encounter for other specified aftercare: Secondary | ICD-10-CM

## 2011-07-03 DIAGNOSIS — M359 Systemic involvement of connective tissue, unspecified: Secondary | ICD-10-CM

## 2011-07-03 DIAGNOSIS — D708 Other neutropenia: Secondary | ICD-10-CM

## 2011-07-03 MED ORDER — FILGRASTIM 480 MCG/0.8ML IJ SOLN
480.0000 ug | Freq: Once | INTRAMUSCULAR | Status: AC
  Administered 2011-07-03: 480 ug via SUBCUTANEOUS
  Filled 2011-07-03: qty 0.8

## 2011-07-04 ENCOUNTER — Ambulatory Visit: Payer: Medicare Other

## 2011-07-09 ENCOUNTER — Other Ambulatory Visit (HOSPITAL_BASED_OUTPATIENT_CLINIC_OR_DEPARTMENT_OTHER): Payer: Medicare Other | Admitting: Lab

## 2011-07-09 ENCOUNTER — Ambulatory Visit (HOSPITAL_BASED_OUTPATIENT_CLINIC_OR_DEPARTMENT_OTHER): Payer: Medicare Other

## 2011-07-09 DIAGNOSIS — M0500 Felty's syndrome, unspecified site: Secondary | ICD-10-CM

## 2011-07-09 DIAGNOSIS — M359 Systemic involvement of connective tissue, unspecified: Secondary | ICD-10-CM

## 2011-07-09 DIAGNOSIS — D708 Other neutropenia: Secondary | ICD-10-CM

## 2011-07-09 LAB — CBC WITH DIFFERENTIAL/PLATELET
BASO%: 0.3 % (ref 0.0–2.0)
Basophils Absolute: 0 10*3/uL (ref 0.0–0.1)
Eosinophils Absolute: 0 10*3/uL (ref 0.0–0.5)
HCT: 41.5 % (ref 34.8–46.6)
HGB: 14.5 g/dL (ref 11.6–15.9)
LYMPH%: 22.7 % (ref 14.0–49.7)
MCHC: 34.9 g/dL (ref 31.5–36.0)
MONO#: 0.5 10*3/uL (ref 0.1–0.9)
NEUT%: 69.3 % (ref 38.4–76.8)
Platelets: 211 10*3/uL (ref 145–400)
WBC: 7.1 10*3/uL (ref 3.9–10.3)
lymph#: 1.6 10*3/uL (ref 0.9–3.3)

## 2011-07-09 MED ORDER — FILGRASTIM 480 MCG/0.8ML IJ SOLN
480.0000 ug | Freq: Once | INTRAMUSCULAR | Status: AC
  Administered 2011-07-09: 480 ug via SUBCUTANEOUS
  Filled 2011-07-09: qty 0.8

## 2011-07-09 NOTE — Progress Notes (Signed)
[place] here for Neupogen injection.   Manuel BP  86/56-72.  Rechecked by Melina Schools, Dr Murinson's nurse.  She got BP 100/62 Left arm and 92/64 Right arm. Pt states that she has gotten a sinus infection and is going to call her PCP for an antibiotic.  We have encouraged her to drink lots of fluids and call her PCP.

## 2011-07-14 ENCOUNTER — Other Ambulatory Visit: Payer: Self-pay | Admitting: Internal Medicine

## 2011-07-14 ENCOUNTER — Ambulatory Visit (HOSPITAL_BASED_OUTPATIENT_CLINIC_OR_DEPARTMENT_OTHER): Payer: Medicare Other

## 2011-07-14 DIAGNOSIS — Z5189 Encounter for other specified aftercare: Secondary | ICD-10-CM

## 2011-07-14 DIAGNOSIS — Z1231 Encounter for screening mammogram for malignant neoplasm of breast: Secondary | ICD-10-CM

## 2011-07-14 DIAGNOSIS — M359 Systemic involvement of connective tissue, unspecified: Secondary | ICD-10-CM

## 2011-07-14 DIAGNOSIS — D708 Other neutropenia: Secondary | ICD-10-CM

## 2011-07-14 DIAGNOSIS — M0500 Felty's syndrome, unspecified site: Secondary | ICD-10-CM

## 2011-07-14 MED ORDER — FILGRASTIM 480 MCG/0.8ML IJ SOLN
480.0000 ug | Freq: Once | INTRAMUSCULAR | Status: AC
  Administered 2011-07-14: 480 ug via SUBCUTANEOUS
  Filled 2011-07-14: qty 0.8

## 2011-07-16 ENCOUNTER — Other Ambulatory Visit (HOSPITAL_BASED_OUTPATIENT_CLINIC_OR_DEPARTMENT_OTHER): Payer: Medicare Other | Admitting: Lab

## 2011-07-16 ENCOUNTER — Encounter: Payer: Medicare Other | Attending: Physical Medicine & Rehabilitation | Admitting: Physical Medicine & Rehabilitation

## 2011-07-16 DIAGNOSIS — M7530 Calcific tendinitis of unspecified shoulder: Secondary | ICD-10-CM

## 2011-07-16 DIAGNOSIS — M069 Rheumatoid arthritis, unspecified: Secondary | ICD-10-CM | POA: Insufficient documentation

## 2011-07-16 DIAGNOSIS — D708 Other neutropenia: Secondary | ICD-10-CM

## 2011-07-16 DIAGNOSIS — M25539 Pain in unspecified wrist: Secondary | ICD-10-CM | POA: Insufficient documentation

## 2011-07-16 DIAGNOSIS — IMO0001 Reserved for inherently not codable concepts without codable children: Secondary | ICD-10-CM | POA: Insufficient documentation

## 2011-07-16 DIAGNOSIS — M0500 Felty's syndrome, unspecified site: Secondary | ICD-10-CM

## 2011-07-16 DIAGNOSIS — F329 Major depressive disorder, single episode, unspecified: Secondary | ICD-10-CM

## 2011-07-16 DIAGNOSIS — M25519 Pain in unspecified shoulder: Secondary | ICD-10-CM | POA: Insufficient documentation

## 2011-07-16 DIAGNOSIS — M79609 Pain in unspecified limb: Secondary | ICD-10-CM | POA: Insufficient documentation

## 2011-07-16 DIAGNOSIS — M67919 Unspecified disorder of synovium and tendon, unspecified shoulder: Secondary | ICD-10-CM | POA: Insufficient documentation

## 2011-07-16 DIAGNOSIS — M719 Bursopathy, unspecified: Secondary | ICD-10-CM | POA: Insufficient documentation

## 2011-07-16 DIAGNOSIS — M359 Systemic involvement of connective tissue, unspecified: Secondary | ICD-10-CM

## 2011-07-16 LAB — CBC WITH DIFFERENTIAL/PLATELET
BASO%: 0 % (ref 0.0–2.0)
Basophils Absolute: 0 10*3/uL (ref 0.0–0.1)
EOS%: 0 % (ref 0.0–7.0)
HCT: 40.1 % (ref 34.8–46.6)
HGB: 13.8 g/dL (ref 11.6–15.9)
LYMPH%: 6.9 % — ABNORMAL LOW (ref 14.0–49.7)
MCH: 32.4 pg (ref 25.1–34.0)
MCHC: 34.3 g/dL (ref 31.5–36.0)
MCV: 94.4 fL (ref 79.5–101.0)
MONO%: 3.1 % (ref 0.0–14.0)
NEUT%: 90 % — ABNORMAL HIGH (ref 38.4–76.8)
Platelets: 201 10*3/uL (ref 145–400)
lymph#: 0.6 10*3/uL — ABNORMAL LOW (ref 0.9–3.3)

## 2011-07-16 NOTE — Assessment & Plan Note (Signed)
[name] is back regarding her rheumatoid arthritis and fibromyalgia. Apparently, her husband shredded her fentanyl prescription at beginning of last month and she has been out of her fentanyl patches for about 2 weeks.  Pamelor was added at 75 mg and this has helped her sleep as replaced her Valium.  Her pain is about 7-9/10 currently involving the arms and shoulders predominantly as well as left wrist more than right wrist.  She sees her rheumatologist tomorrow.  REVIEW OF SYSTEMS:  Notable for the above.  Full 12-point review is in the written health and history section of the chart.  SOCIAL HISTORY:  Unchanged.  She did mention that her husband had a stroke in October and is having some attention and memory issues.  PHYSICAL EXAMINATION:  VITAL SIGNS:  Blood pressure is 110/80, pulse 103, respiratory rate is 16 and she is satting 97% on room air. GENERAL:  The patient is pleasant, alert and oriented x3.  Affect is generally bright and appropriate.  EXTREMITIES:  She is tender to all palpation of the upper extremities particularly the shoulders laterally and anteriorly.  The left wrist is tender with range even with her splint in place. NEUROLOGIC:  Cognitively, she is appropriate and alert.  She walks with normal gait pattern. HEART:  Regular, except for tachycardic. CHEST:  Clear.  ASSESSMENT: 1. History of rheumatoid arthritis and fibromyalgia 2. Bilateral rotator cuff syndrome.  PLAN: 1. After informed consent, we injected both shoulders via lateral     approach with 40 mg Kenalog and 3 mL of 1% lidocaine.  The patient     tolerated well. 2. I resumed fentanyl 50 mcg, but talked with the patient at length     regarding safety with her prescriptions.  I also discussed the     concept of withdrawal.  I told her that I would be willing to     resume the medication, but if she has further problems like this we     will have to reconsider. 3. Refilled her hydrocodone 5/500  one q.8 h. p.r.n., #90. 4. The patient sees her rheumatologist tomorrow for further assessment     and possible treatment options. 5. We discussed appropriate range of motion, exercise and carrying     capacity.  She had a purse with her that we at least 10 pounds     today.     Ranelle Oyster, M.D. Electronically Signed    ZTS/MedQ D:  07/16/2011 10:46:14  T:  07/16/2011 17:33:39  Job #:  [id number]

## 2011-07-17 ENCOUNTER — Ambulatory Visit (HOSPITAL_BASED_OUTPATIENT_CLINIC_OR_DEPARTMENT_OTHER): Payer: Medicare Other

## 2011-07-17 DIAGNOSIS — M0500 Felty's syndrome, unspecified site: Secondary | ICD-10-CM

## 2011-07-17 DIAGNOSIS — D708 Other neutropenia: Secondary | ICD-10-CM

## 2011-07-17 DIAGNOSIS — M359 Systemic involvement of connective tissue, unspecified: Secondary | ICD-10-CM

## 2011-07-17 MED ORDER — FILGRASTIM 480 MCG/0.8ML IJ SOLN
480.0000 ug | Freq: Once | INTRAMUSCULAR | Status: AC
  Administered 2011-07-17: 480 ug via SUBCUTANEOUS
  Filled 2011-07-17: qty 0.8

## 2011-07-18 ENCOUNTER — Other Ambulatory Visit: Payer: Self-pay | Admitting: *Deleted

## 2011-07-21 ENCOUNTER — Ambulatory Visit (HOSPITAL_BASED_OUTPATIENT_CLINIC_OR_DEPARTMENT_OTHER): Payer: Medicare Other

## 2011-07-21 ENCOUNTER — Ambulatory Visit: Payer: Medicare Other | Admitting: Oncology

## 2011-07-21 ENCOUNTER — Other Ambulatory Visit: Payer: Medicare Other | Admitting: Lab

## 2011-07-21 VITALS — BP 109/77 | HR 95 | Temp 98.2°F | Ht 62.5 in | Wt 175.0 lb

## 2011-07-21 DIAGNOSIS — M359 Systemic involvement of connective tissue, unspecified: Secondary | ICD-10-CM

## 2011-07-21 DIAGNOSIS — D708 Other neutropenia: Secondary | ICD-10-CM

## 2011-07-21 DIAGNOSIS — M0500 Felty's syndrome, unspecified site: Secondary | ICD-10-CM

## 2011-07-21 DIAGNOSIS — D704 Cyclic neutropenia: Secondary | ICD-10-CM

## 2011-07-21 LAB — COMPREHENSIVE METABOLIC PANEL
ALT: 12 U/L (ref 0–35)
AST: 11 U/L (ref 0–37)
Alkaline Phosphatase: 71 U/L (ref 39–117)
BUN: 18 mg/dL (ref 6–23)
Creatinine, Ser: 0.78 mg/dL (ref 0.50–1.10)
Potassium: 3.8 mEq/L (ref 3.5–5.3)

## 2011-07-21 LAB — CBC WITH DIFFERENTIAL/PLATELET
BASO%: 0.3 % (ref 0.0–2.0)
Basophils Absolute: 0 10*3/uL (ref 0.0–0.1)
EOS%: 0.2 % (ref 0.0–7.0)
HGB: 15.9 g/dL (ref 11.6–15.9)
MCH: 32.2 pg (ref 25.1–34.0)
MCHC: 34.3 g/dL (ref 31.5–36.0)
MCV: 93.6 fL (ref 79.5–101.0)
MONO%: 5.3 % (ref 0.0–14.0)
NEUT%: 86.8 % — ABNORMAL HIGH (ref 38.4–76.8)
RDW: 12.5 % (ref 11.2–14.5)
lymph#: 0.6 10*3/uL — ABNORMAL LOW (ref 0.9–3.3)

## 2011-07-21 MED ORDER — FILGRASTIM 480 MCG/0.8ML IJ SOLN
480.0000 ug | Freq: Once | INTRAMUSCULAR | Status: AC
  Administered 2011-07-21: 480 ug via SUBCUTANEOUS
  Filled 2011-07-21: qty 0.8

## 2011-07-21 NOTE — Progress Notes (Signed)
This office note has been dictated.  #[id number]

## 2011-07-21 NOTE — Progress Notes (Signed)
CC:   Thayer Headings, M.D. Ollen Gross, M.D. Barbette Hair. Arlyce Dice, MD,FACG Kalman Shan, MD Alben Deeds, MD  PROBLEM LIST: 1. 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  Bone marrow was carried out on 06/19/2000 and     again on 02/16/2003.  There were no abnormalities noted.  The bone     marrows were negative. 2. History of rheumatoid arthritis with positive rheumatoid factor,     anti neutrophil antibodies with diagnosis going back to 1986. 3. Fibromyalgia with date of onset 1993. 4. History of GI bleeding in the fall of 2012 with negative endoscopy     and colonoscopy by Dr. Melvia Heaps on 04/22/2011.  Stools on     05/15/2011 were negative x5.  The patient denies any signs of GI     bleeding at the present time. 5. Right middle lobe consolidation noted in September 2012 with most     recent CT scan of the chest carried out on 05/16/2011.  These scans     showed some slight improvement as compared with the CT angiogram of     the chest carried out on 02/24/2011.  Followup CT scan of the chest     is apparently scheduled for May 2013. 6. Osteopenia. 7. History of porphyria cutanea tarda diagnosed in 1992. 8. Left total knee arthroplasty by Dr. Ollen Gross on 06/19/2008.  MEDICATIONS: 1. Calcium carbonate 600 mg twice a day. 2. Vitamin D 1000 units daily. 3. Copper gluconate 2 mg daily. 4. Flexeril 10 mg at bedtime. 5. Cymbalta 60 mg twice a day. 6. Duragesic patch 50 mcg/hour, change every 3 days. 7. Neupogen 480 mcg subcu every Monday and Thursday, i.e., twice     weekly. 8. Vicodin 5/500 twice a day as needed for pain. 9. Lyrica 200 mg twice a day. 10.Pamelor 75 mg 1 at bedtime. 11.Prilosec 20 mg twice a day. 12.Current prednisone dose is 20 mg alternating with 10 mg on a daily     basis.  Starting today, the patient will be on 10 mg daily. 13.Vitamin B12 1000 mcg daily. 14.Probiotic 1 capsule daily.  HISTORY:   [name] is seen today for followup of her autoimmune neutropenia felt to be due most likely to Felty syndrome in association with rheumatoid arthritis.  Ms. Kropp was last seen by [place] on 06/23/2011.  She is currently on prednisone 20 mg alternating with 10 mg on a daily basis and Neupogen 480 mcg subcu every Monday and Thursday. She has done quite well.  There are no changes in her condition.  No history of fever or infections.  We have been checking CBCs on a weekly basis, and her blood counts have been stable.  There has been no neutropenia.  The patient has hot flashes related to menopause.  PHYSICAL EXAMINATION:  General:  She looks well.  Vital Signs:  Weight is 175 pounds, height 5 feet 2-1/2 inches, body surface area 1.87 sq m. Blood pressure 109/77.  Other vital signs are normal.  She is afebrile. Her weight on 06/23/2011 was 175 pounds.  HEENT:  No scleral icterus. Mouth and pharynx are benign.  Lymphatic:  No peripheral adenopathy palpable.  Heart/Lungs:  Normal.  No Port-A-Cath or central catheters. Abdomen:  Obese, nontender with no organomegaly or masses palpable. Ecchymoses are present from her Neupogen injections.  Extremities:  No peripheral edema or clubbing.  The patient has a lot of purpura over the upper extremities.  LABORATORY DATA:  Today, white count 7.6, ANC 6.6, hemoglobin 15.9, hematocrit 46.1, platelets 276,000.  Chemistries today are pending including a sedimentation rate.  Chemistries from 06/23/2011 were normal.  Sedimentation rate was 14.  IMAGING STUDIES:  CT scan of the chest without IV contrast on 05/16/2011 showed slight improvement in the right middle lobe consolidation containing air bronchograms.  No endobronchial lesion was identified. It was suggested that continued radiographic follow up to be carried out to ensure resolution.  I reviewed this CT scan as well as the previous scan of 02/28/2011 with the radiologist.  IMPRESSION AND  PLAN:  Ms. Deremer continues to do well at the present time.  We will decrease her current prednisone dose from 20 mg alternating with 10 mg down to 10 mg daily.  Previously, Ms. Guiffre had been on 5 mg daily.  We will leave the Neupogen dose the same at 480 mcg subcu every Monday and Thursday for now.  We will plan to check a CBC in 2 weeks, which will be February 25th.  If the CBC is stable, then we will probably drop the Neupogen dose down to once a week.  Ms. Steuart will see [place] again around March 15th, at which time we will check CBC and chemistries.  At that time, if her blood counts are stable, we may discontinue the Neupogen.  The patient tells me that she and her husband will be going to [place] from March 21st through April 9th.    ______________________________ Samul Dada, M.D. DSM/MEDQ  D:  07/21/2011  T:  07/21/2011  Job:  [id number]

## 2011-07-22 ENCOUNTER — Ambulatory Visit
Admission: RE | Admit: 2011-07-22 | Discharge: 2011-07-22 | Disposition: A | Discharge status: Home / Self Care | Payer: Medicare Other | Source: Ambulatory Visit | Attending: Internal Medicine | Admitting: Internal Medicine

## 2011-07-22 DIAGNOSIS — Z1231 Encounter for screening mammogram for malignant neoplasm of breast: Secondary | ICD-10-CM

## 2011-07-24 ENCOUNTER — Telehealth: Payer: Self-pay | Admitting: Oncology

## 2011-07-24 ENCOUNTER — Ambulatory Visit (HOSPITAL_BASED_OUTPATIENT_CLINIC_OR_DEPARTMENT_OTHER): Payer: Medicare Other

## 2011-07-24 ENCOUNTER — Other Ambulatory Visit: Payer: Self-pay

## 2011-07-24 DIAGNOSIS — M0500 Felty's syndrome, unspecified site: Secondary | ICD-10-CM

## 2011-07-24 DIAGNOSIS — D708 Other neutropenia: Secondary | ICD-10-CM

## 2011-07-24 DIAGNOSIS — Z5189 Encounter for other specified aftercare: Secondary | ICD-10-CM

## 2011-07-24 DIAGNOSIS — M359 Systemic involvement of connective tissue, unspecified: Secondary | ICD-10-CM

## 2011-07-24 MED ORDER — FILGRASTIM 480 MCG/0.8ML IJ SOLN
480.0000 ug | Freq: Once | INTRAMUSCULAR | Status: AC
  Administered 2011-07-24: 480 ug via SUBCUTANEOUS
  Filled 2011-07-24: qty 0.8

## 2011-07-24 NOTE — Telephone Encounter (Signed)
pt aware appt moved from 3/15 to 3/14   aom

## 2011-07-28 ENCOUNTER — Ambulatory Visit (HOSPITAL_BASED_OUTPATIENT_CLINIC_OR_DEPARTMENT_OTHER): Payer: Medicare Other

## 2011-07-28 DIAGNOSIS — M0500 Felty's syndrome, unspecified site: Secondary | ICD-10-CM

## 2011-07-28 DIAGNOSIS — M359 Systemic involvement of connective tissue, unspecified: Secondary | ICD-10-CM

## 2011-07-28 DIAGNOSIS — D708 Other neutropenia: Secondary | ICD-10-CM

## 2011-07-28 DIAGNOSIS — Z5189 Encounter for other specified aftercare: Secondary | ICD-10-CM

## 2011-07-28 MED ORDER — FILGRASTIM 480 MCG/0.8ML IJ SOLN
480.0000 ug | Freq: Once | INTRAMUSCULAR | Status: AC
  Administered 2011-07-28: 480 ug via SUBCUTANEOUS
  Filled 2011-07-28: qty 0.8

## 2011-07-31 ENCOUNTER — Ambulatory Visit (HOSPITAL_BASED_OUTPATIENT_CLINIC_OR_DEPARTMENT_OTHER): Payer: Medicare Other

## 2011-07-31 DIAGNOSIS — D708 Other neutropenia: Secondary | ICD-10-CM

## 2011-07-31 DIAGNOSIS — M0500 Felty's syndrome, unspecified site: Secondary | ICD-10-CM

## 2011-07-31 DIAGNOSIS — M359 Systemic involvement of connective tissue, unspecified: Secondary | ICD-10-CM

## 2011-07-31 MED ORDER — FILGRASTIM 480 MCG/0.8ML IJ SOLN
480.0000 ug | Freq: Once | INTRAMUSCULAR | Status: AC
  Administered 2011-07-31: 480 ug via SUBCUTANEOUS
  Filled 2011-07-31: qty 0.8

## 2011-08-04 ENCOUNTER — Telehealth: Payer: Self-pay | Admitting: Oncology

## 2011-08-04 ENCOUNTER — Ambulatory Visit: Payer: Medicare Other

## 2011-08-04 ENCOUNTER — Other Ambulatory Visit: Payer: Medicare Other

## 2011-08-04 NOTE — Telephone Encounter (Signed)
ptr called and is ill and spoke with [facility] whom gave ok to r/s lab and inj to 2/26  aom

## 2011-08-05 ENCOUNTER — Telehealth: Payer: Self-pay

## 2011-08-05 ENCOUNTER — Ambulatory Visit (HOSPITAL_BASED_OUTPATIENT_CLINIC_OR_DEPARTMENT_OTHER): Payer: Medicare Other

## 2011-08-05 ENCOUNTER — Other Ambulatory Visit: Payer: Medicare Other | Admitting: Lab

## 2011-08-05 DIAGNOSIS — Z5189 Encounter for other specified aftercare: Secondary | ICD-10-CM

## 2011-08-05 DIAGNOSIS — D704 Cyclic neutropenia: Secondary | ICD-10-CM

## 2011-08-05 DIAGNOSIS — D708 Other neutropenia: Secondary | ICD-10-CM

## 2011-08-05 DIAGNOSIS — M359 Systemic involvement of connective tissue, unspecified: Secondary | ICD-10-CM

## 2011-08-05 DIAGNOSIS — M0500 Felty's syndrome, unspecified site: Secondary | ICD-10-CM

## 2011-08-05 LAB — CBC WITH DIFFERENTIAL/PLATELET
Eosinophils Absolute: 0 10*3/uL (ref 0.0–0.5)
LYMPH%: 25.6 % (ref 14.0–49.7)
MONO#: 0.4 10*3/uL (ref 0.1–0.9)
NEUT#: 3.8 10*3/uL (ref 1.5–6.5)
Platelets: 205 10*3/uL (ref 145–400)
RBC: 4.54 10*6/uL (ref 3.70–5.45)
RDW: 13.1 % (ref 11.2–14.5)
WBC: 5.6 10*3/uL (ref 3.9–10.3)
lymph#: 1.4 10*3/uL (ref 0.9–3.3)

## 2011-08-05 MED ORDER — FILGRASTIM 480 MCG/0.8ML IJ SOLN
480.0000 ug | Freq: Once | INTRAMUSCULAR | Status: AC
  Administered 2011-08-05: 480 ug via SUBCUTANEOUS
  Filled 2011-08-05: qty 0.8

## 2011-08-05 NOTE — Telephone Encounter (Signed)
[name]: she is on weekly neupogen injections. She is taking prednisone 10 mg daily.  Her schedule is weekly for the next 2 weeks and to F/U with Dr Arline Asp on the 14th. She voiced understanding. Dr Arline Asp is considering stopping neupogen if counts remain stable.

## 2011-08-11 ENCOUNTER — Ambulatory Visit (HOSPITAL_BASED_OUTPATIENT_CLINIC_OR_DEPARTMENT_OTHER): Payer: Medicare Other

## 2011-08-11 DIAGNOSIS — Z5189 Encounter for other specified aftercare: Secondary | ICD-10-CM

## 2011-08-11 DIAGNOSIS — D708 Other neutropenia: Secondary | ICD-10-CM

## 2011-08-11 DIAGNOSIS — M359 Systemic involvement of connective tissue, unspecified: Secondary | ICD-10-CM

## 2011-08-11 DIAGNOSIS — M0500 Felty's syndrome, unspecified site: Secondary | ICD-10-CM

## 2011-08-11 MED ORDER — FILGRASTIM 480 MCG/0.8ML IJ SOLN
480.0000 ug | Freq: Once | INTRAMUSCULAR | Status: AC
  Administered 2011-08-11: 480 ug via SUBCUTANEOUS
  Filled 2011-08-11: qty 0.8

## 2011-08-13 ENCOUNTER — Encounter: Payer: Medicare Other | Attending: Physical Medicine & Rehabilitation | Admitting: *Deleted

## 2011-08-13 ENCOUNTER — Encounter: Payer: Self-pay | Admitting: *Deleted

## 2011-08-13 VITALS — BP 138/81 | HR 107 | Resp 18 | Ht 62.0 in | Wt 177.0 lb

## 2011-08-13 DIAGNOSIS — M67919 Unspecified disorder of synovium and tendon, unspecified shoulder: Secondary | ICD-10-CM | POA: Insufficient documentation

## 2011-08-13 DIAGNOSIS — M719 Bursopathy, unspecified: Secondary | ICD-10-CM | POA: Insufficient documentation

## 2011-08-13 DIAGNOSIS — M069 Rheumatoid arthritis, unspecified: Secondary | ICD-10-CM

## 2011-08-13 DIAGNOSIS — IMO0001 Reserved for inherently not codable concepts without codable children: Secondary | ICD-10-CM

## 2011-08-13 MED ORDER — PREGABALIN 200 MG PO CAPS: 200.0000 mg | ORAL_CAPSULE | Freq: Two times a day (BID) | ORAL | Status: DC

## 2011-08-13 MED ORDER — HYDROCODONE-ACETAMINOPHEN 5-500 MG PO TABS: 1.0000 | ORAL_TABLET | Freq: Three times a day (TID) | ORAL | Status: DC | PRN

## 2011-08-13 MED ORDER — FENTANYL 50 MCG/HR TD PT72: 1.0000 | MEDICATED_PATCH | TRANSDERMAL | Status: DC

## 2011-08-13 NOTE — Progress Notes (Signed)
Obvious swelling rt knee; states had hoped someone could draw fluid off knee today. Will call rheumatologist or orthopedic office to see if she can get this done. Had ED visit in [facility] for flu/dehydration. Brought d/c papers; she did receive some IV dilaudid for abdominal pain. Reports poor sleep since discontinuing diazepam. Will ask Dr Riley Kill about this.

## 2011-08-13 NOTE — Progress Notes (Signed)
Addended by: Leonie Douglas A on: 08/13/2011 04:32 PM   Modules accepted: Level of Service

## 2011-08-14 ENCOUNTER — Telehealth: Payer: Self-pay | Admitting: Physical Medicine & Rehabilitation

## 2011-08-14 MED ORDER — NORTRIPTYLINE HCL 50 MG PO CAPS: 100.0000 mg | ORAL_CAPSULE | Freq: Every day | ORAL | Status: DC

## 2011-08-14 NOTE — Telephone Encounter (Signed)
Dr Riley Kill alerted to your sleep issues; he has called in an increased dose of your current sleep med to [facility].

## 2011-08-14 NOTE — Telephone Encounter (Signed)
i increased pamelor to 100mg  and escripted [facility]

## 2011-08-14 NOTE — Telephone Encounter (Signed)
Message copied by Ranelle Oyster on Thu Aug 14, 2011  8:34 AM ------      Message from: Leonie Douglas A      Created: Wed Aug 13, 2011  4:16 PM      Regarding: Med change       RN visit today. Diazepam 5mg  @ [facility] was discontinued 2 months ago. She now is having a lot of trouble falling asleep. Nortriptyline not helping as much as the diazepam was.                 Do you want to prescribe something else for sleep?

## 2011-08-18 ENCOUNTER — Other Ambulatory Visit: Payer: Self-pay | Admitting: *Deleted

## 2011-08-18 ENCOUNTER — Ambulatory Visit (HOSPITAL_BASED_OUTPATIENT_CLINIC_OR_DEPARTMENT_OTHER): Payer: Medicare Other

## 2011-08-18 DIAGNOSIS — M359 Systemic involvement of connective tissue, unspecified: Secondary | ICD-10-CM

## 2011-08-18 DIAGNOSIS — M0500 Felty's syndrome, unspecified site: Secondary | ICD-10-CM

## 2011-08-18 DIAGNOSIS — D708 Other neutropenia: Secondary | ICD-10-CM

## 2011-08-18 DIAGNOSIS — Z5189 Encounter for other specified aftercare: Secondary | ICD-10-CM

## 2011-08-18 MED ORDER — FILGRASTIM 480 MCG/0.8ML IJ SOLN
480.0000 ug | Freq: Once | INTRAMUSCULAR | Status: AC
  Administered 2011-08-18: 480 ug via SUBCUTANEOUS
  Filled 2011-08-18: qty 0.8

## 2011-08-18 MED ORDER — DULOXETINE HCL 60 MG PO CPEP: 60.0000 mg | ORAL_CAPSULE | Freq: Two times a day (BID) | ORAL | Status: DC

## 2011-08-20 ENCOUNTER — Telehealth: Payer: Self-pay | Admitting: Physical Medicine & Rehabilitation

## 2011-08-20 NOTE — Telephone Encounter (Signed)
Pt aware that her cymbalta was sent in yesterday. The nortripyline was a misunderstanding she thought she was getting less than what she had been taking but she is actually taking 2 50mg  capsules QHS instead of 1 75mg  capsule.

## 2011-08-20 NOTE — Telephone Encounter (Signed)
Patient had pharmacy to fax Cymbalta r/f request last Friday.  Still have not heard anything, did we get it?  Also, Dr changed meds to help her slepp, calli=ed in 15mg  Nortriptyline, already on 75 mg Nortriptyline, so she cannot take the 15 mg.  Please call.

## 2011-08-21 ENCOUNTER — Ambulatory Visit (HOSPITAL_BASED_OUTPATIENT_CLINIC_OR_DEPARTMENT_OTHER): Payer: Medicare Other | Admitting: Oncology

## 2011-08-21 ENCOUNTER — Encounter: Payer: Self-pay | Admitting: Oncology

## 2011-08-21 ENCOUNTER — Other Ambulatory Visit (HOSPITAL_BASED_OUTPATIENT_CLINIC_OR_DEPARTMENT_OTHER): Payer: Medicare Other | Admitting: Lab

## 2011-08-21 DIAGNOSIS — M359 Systemic involvement of connective tissue, unspecified: Secondary | ICD-10-CM

## 2011-08-21 DIAGNOSIS — D704 Cyclic neutropenia: Secondary | ICD-10-CM

## 2011-08-21 DIAGNOSIS — D708 Other neutropenia: Secondary | ICD-10-CM

## 2011-08-21 DIAGNOSIS — M0500 Felty's syndrome, unspecified site: Secondary | ICD-10-CM

## 2011-08-21 LAB — COMPREHENSIVE METABOLIC PANEL
AST: 19 U/L (ref 0–37)
BUN: 12 mg/dL (ref 6–23)
Calcium: 9.3 mg/dL (ref 8.4–10.5)
Chloride: 99 mEq/L (ref 96–112)
Creatinine, Ser: 0.54 mg/dL (ref 0.50–1.10)

## 2011-08-21 LAB — CBC WITH DIFFERENTIAL/PLATELET
Basophils Absolute: 0 10*3/uL (ref 0.0–0.1)
EOS%: 0 % (ref 0.0–7.0)
HCT: 41.3 % (ref 34.8–46.6)
HGB: 14.2 g/dL (ref 11.6–15.9)
MCH: 32 pg (ref 25.1–34.0)
MCV: 93.1 fL (ref 79.5–101.0)
NEUT%: 79.4 % — ABNORMAL HIGH (ref 38.4–76.8)
Platelets: 240 10*3/uL (ref 145–400)
lymph#: 1.4 10*3/uL (ref 0.9–3.3)

## 2011-08-21 LAB — LACTATE DEHYDROGENASE: LDH: 134 U/L (ref 94–250)

## 2011-08-21 NOTE — Progress Notes (Signed)
CC:   Thayer Headings, M.D. Ollen Gross, M.D. Barbette Hair. Arlyce Dice, MD,FACG Kalman Shan, MD Alben Deeds, MD   PROBLEM LIST:  1. 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 Bone marrow was carried out on 06/19/2000 and  again on 02/16/2003. There were no abnormalities noted. The bone  marrows were negative.  2. History of rheumatoid arthritis with positive rheumatoid factor,  anti neutrophil antibodies with diagnosis going back to 1986.  3. Fibromyalgia with date of onset 1993.  4. History of GI bleeding in the fall of 2012 with negative endoscopy  and colonoscopy by Dr. Melvia Heaps on 04/22/2011. Stools on  05/15/2011 were negative x5. The patient denies any signs of GI  bleeding at the present time.  5. Right middle lobe consolidation noted in September 2012 with most  recent CT scan of the chest carried out on 05/16/2011. These scans  showed some slight improvement as compared with the CT angiogram of  the chest carried out on 02/24/2011. Followup CT scan of the chest  is apparently scheduled for May 2013.  6. Osteopenia.  7. History of porphyria cutanea tarda diagnosed in 1992.  8. Left total knee arthroplasty by Dr. Ollen Gross on 06/19/2008.    MEDICATIONS:  1. Calcium carbonate 600 mg twice a day.  2. Vitamin D 1000 units daily.  3. Copper gluconate 2 mg daily.  4. Flexeril 10 mg at bedtime.  5. Cymbalta 60 mg twice a day.  6. Duragesic patch 50 mcg/hour, change every 3 days.  7. Neupogen 480 mcg subcutaneously weekly on Monday. 8. Vicodin 5/500 twice a day as needed for pain.  9. Lyrica 200 mg twice a day.  10.Pamelor 100 mg 1 at bedtime.  11.Prilosec 20 mg twice a day.  12.Current prednisone dose is 10 mg daily.  13.Vitamin B12 1000 mcg daily.  14.Probiotic 1 capsule daily.   HISTORY:  [name] was seen today for followup of her autoimmune neutropenia, felt to be most likely due to Felty syndrome in  association with rheumatoid arthritis.  She was last seen by [place] on 07/21/2011.  She continues on prednisone 10 mg daily.  Prior to the present problems that began in the fall of 2012, the patient was on 5 mg of prednisone.  Over the past few weeks, she has been on Neupogen 480 mcg weekly.  Her last dose was on March 11th.  The patient did have what appears to be a bout of Norovirus around February 23rd and went to the emergency room for IV fluids.  Recovery was fairly quick.  Blood counts were okay.  She did not have any fever.  Currently, she feels well without symptoms.  She and her husband are planning to go to [place] from March 21st through April 9th.  PHYSICAL EXAM:  General: [name] looks well.  Still slightly cushingoid.  Vital signs: Weight is 179.3 pounds, height 5 feet 2 inches, body surface area 1.89 m squared.  Blood pressure 114/78.  Other vital signs are normal.  She is afebrile.  HEENT: There is no scleral icterus.  Mouth and pharynx benign.  No thrush.  No adenopathy.  Heart and lungs are normal.  The patient does not have a Port-A-Cath or central catheter.  Abdomen:  Obese, nontender, with no organomegaly or masses palpable.  Extremities: She has some petechial lesions over her arms where she had some trauma from her dog.  She has some purpura over her lower legs.  Neurologic  exam: Grossly normal.  LABORATORY DATA:  Today, white count 8.1, ANC 6.4, hemoglobin 14.2, hematocrit 41.3, and platelets 240,000.  Chemistries today were normal except for a glucose of 152.  BUN was 12, creatinine 0.54. Sedimentation rate from 07/21/2011 was 1.  IMAGING STUDIES: 1. CT scan of the chest without IV contrast on 05/16/2011  showed slight improvement in the right middle lobe consolidation  containing air bronchograms. No endobronchial lesion was identified.  It was suggested that continued radiographic follow up be carried out  to ensure resolution. I reviewed this CT scan as  well as the previous  scan of 02/28/2011 with the radiologist. 2. Screening bilateral mammogram from 07/22/2011 was negative.  IMPRESSION AND PLAN:  [name] continues to do well.  At this point, we will have her stop the Neupogen entirely.  She has been getting her shots here at the [facility].  She will continue on prednisone 10 mg daily.  She understands to call us should she have any febrile illnesses, chills, malaise, or sense of ill health.  As stated, she and her husband are going to [place] from March 21st through April 9th.  She will have a CBC on April 11th, and we will plan to see [name] again on May 9th at which time we will check CBC and chemistries.  At that time, if her blood counts are stable and she is doing well, we may cut back her Prednisone to 5 mg daily.  The patient tells me she will be having a CT scan of the chest on May 7th.    ______________________________ Samul Dada, M.D. DSM/MEDQ  D:  08/21/2011  T:  08/21/2011  Job:  [id number]

## 2011-08-21 NOTE — Progress Notes (Signed)
This office note has been dictated.  #[id number]

## 2011-08-22 ENCOUNTER — Telehealth: Payer: Self-pay | Admitting: Oncology

## 2011-08-22 ENCOUNTER — Other Ambulatory Visit: Payer: Medicare Other | Admitting: Lab

## 2011-08-22 ENCOUNTER — Ambulatory Visit: Payer: Medicare Other | Admitting: Oncology

## 2011-08-22 NOTE — Telephone Encounter (Signed)
pt aware of 4/11 and 5/9 appts    aom

## 2011-08-22 NOTE — Telephone Encounter (Signed)
Talked to pt, she is aware of appt on 4/11 and in May for lab and MD

## 2011-09-09 ENCOUNTER — Encounter: Payer: Self-pay | Admitting: Physical Medicine & Rehabilitation

## 2011-09-18 ENCOUNTER — Encounter: Payer: Self-pay | Admitting: *Deleted

## 2011-09-18 ENCOUNTER — Encounter: Payer: Medicare Other | Attending: Physical Medicine & Rehabilitation | Admitting: *Deleted

## 2011-09-18 ENCOUNTER — Other Ambulatory Visit (HOSPITAL_BASED_OUTPATIENT_CLINIC_OR_DEPARTMENT_OTHER): Payer: Medicare Other | Admitting: Lab

## 2011-09-18 VITALS — BP 145/82 | HR 108 | Resp 18 | Ht 62.0 in | Wt 186.0 lb

## 2011-09-18 DIAGNOSIS — IMO0001 Reserved for inherently not codable concepts without codable children: Secondary | ICD-10-CM

## 2011-09-18 DIAGNOSIS — M0500 Felty's syndrome, unspecified site: Secondary | ICD-10-CM

## 2011-09-18 DIAGNOSIS — M359 Systemic involvement of connective tissue, unspecified: Secondary | ICD-10-CM

## 2011-09-18 DIAGNOSIS — D708 Other neutropenia: Secondary | ICD-10-CM

## 2011-09-18 DIAGNOSIS — M719 Bursopathy, unspecified: Secondary | ICD-10-CM | POA: Insufficient documentation

## 2011-09-18 DIAGNOSIS — M069 Rheumatoid arthritis, unspecified: Secondary | ICD-10-CM

## 2011-09-18 DIAGNOSIS — M67919 Unspecified disorder of synovium and tendon, unspecified shoulder: Secondary | ICD-10-CM | POA: Insufficient documentation

## 2011-09-18 LAB — CBC WITH DIFFERENTIAL/PLATELET
BASO%: 0.3 % (ref 0.0–2.0)
Basophils Absolute: 0 10*3/uL (ref 0.0–0.1)
EOS%: 0 % (ref 0.0–7.0)
HGB: 13.1 g/dL (ref 11.6–15.9)
MCH: 32.2 pg (ref 25.1–34.0)
MCHC: 35 g/dL (ref 31.5–36.0)
RDW: 14.1 % (ref 11.2–14.5)
WBC: 2.9 10*3/uL — ABNORMAL LOW (ref 3.9–10.3)
lymph#: 1 10*3/uL (ref 0.9–3.3)

## 2011-09-18 MED ORDER — FENTANYL 50 MCG/HR TD PT72: 1.0000 | MEDICATED_PATCH | TRANSDERMAL | Status: DC

## 2011-09-18 NOTE — Progress Notes (Signed)
Reports generalized increased pain; thinks she is going to have to "just stay in bed". App't today w/ rheumatologist and plans to get left shoulder injection. Wearing a sling due to pain. Last steroid injection here was helpful. Asks if her breakthrough med can be changed to something stronger. I will ask Dr Riley Kill for her. Did not take hydrocodone Rx today pending his answer.

## 2011-09-18 NOTE — Progress Notes (Unsigned)
RN visit today. See note. Asks for a change in her breakthrough medication. Hydrocodone 5/500 tid prn doesn't seem to be effective. Reports Fentanyl patches are strong enough, but gets little relief from the hydrocodone.

## 2011-09-19 ENCOUNTER — Telehealth: Payer: Self-pay | Admitting: Physical Medicine & Rehabilitation

## 2011-09-19 ENCOUNTER — Telehealth: Payer: Self-pay | Admitting: Medical Oncology

## 2011-09-19 MED ORDER — HYDROCODONE-ACETAMINOPHEN 7.5-500 MG PO TABS
1.0000 | ORAL_TABLET | Freq: Four times a day (QID) | ORAL | Status: DC | PRN
Start: 2011-09-19 — End: 2011-09-22

## 2011-09-19 NOTE — Telephone Encounter (Signed)
Medication dose was increased by Dr Riley Kill and called in to her pharmacy

## 2011-09-19 NOTE — Telephone Encounter (Signed)
I called pt per Dr. Arline Asp to see if she is still taking her prednisone 10 mg daily. She states yes. Dr. Arline Asp will be notified.

## 2011-09-19 NOTE — Telephone Encounter (Signed)
i increased it to 7.5/500.  Let's give that a try

## 2011-09-22 ENCOUNTER — Telehealth: Payer: Self-pay | Admitting: Physical Medicine & Rehabilitation

## 2011-09-22 MED ORDER — HYDROCODONE-ACETAMINOPHEN 7.5-500 MG PO TABS: 1.0000 | ORAL_TABLET | Freq: Four times a day (QID) | ORAL | Status: AC | PRN

## 2011-09-22 NOTE — Telephone Encounter (Signed)
Rx called in, pt aware 

## 2011-09-22 NOTE — Telephone Encounter (Signed)
Went to [facility] to pick up script and it was not there.  She only has 1 Hydrocodone left.  Please call.

## 2011-09-23 ENCOUNTER — Other Ambulatory Visit: Payer: Self-pay | Admitting: Oncology

## 2011-09-23 DIAGNOSIS — M0500 Felty's syndrome, unspecified site: Secondary | ICD-10-CM

## 2011-09-23 DIAGNOSIS — M359 Systemic involvement of connective tissue, unspecified: Secondary | ICD-10-CM

## 2011-09-24 ENCOUNTER — Telehealth: Payer: Self-pay | Admitting: Oncology

## 2011-09-24 NOTE — Telephone Encounter (Signed)
s/w pt and she is going on a camping trip today  and can come in on 4/22   aom

## 2011-09-29 ENCOUNTER — Other Ambulatory Visit (HOSPITAL_BASED_OUTPATIENT_CLINIC_OR_DEPARTMENT_OTHER): Payer: Medicare Other | Admitting: Lab

## 2011-09-29 DIAGNOSIS — D704 Cyclic neutropenia: Secondary | ICD-10-CM

## 2011-09-29 DIAGNOSIS — M0500 Felty's syndrome, unspecified site: Secondary | ICD-10-CM

## 2011-09-29 LAB — CBC WITH DIFFERENTIAL/PLATELET
Eosinophils Absolute: 0 10*3/uL (ref 0.0–0.5)
MONO#: 0.2 10*3/uL (ref 0.1–0.9)
NEUT#: 5.1 10*3/uL (ref 1.5–6.5)
RBC: 4.21 10*6/uL (ref 3.70–5.45)
RDW: 14 % (ref 11.2–14.5)
WBC: 5.9 10*3/uL (ref 3.9–10.3)
nRBC: 0 % (ref 0–0)

## 2011-10-14 ENCOUNTER — Encounter: Payer: Self-pay | Admitting: Physical Medicine & Rehabilitation

## 2011-10-14 ENCOUNTER — Encounter: Payer: Medicare Other | Attending: Physical Medicine & Rehabilitation | Admitting: Physical Medicine & Rehabilitation

## 2011-10-14 VITALS — BP 112/61 | HR 92 | Resp 16 | Ht 62.0 in | Wt 187.0 lb

## 2011-10-14 DIAGNOSIS — M19019 Primary osteoarthritis, unspecified shoulder: Secondary | ICD-10-CM | POA: Insufficient documentation

## 2011-10-14 DIAGNOSIS — M12819 Other specific arthropathies, not elsewhere classified, unspecified shoulder: Secondary | ICD-10-CM | POA: Insufficient documentation

## 2011-10-14 DIAGNOSIS — M059 Rheumatoid arthritis with rheumatoid factor, unspecified: Secondary | ICD-10-CM | POA: Insufficient documentation

## 2011-10-14 DIAGNOSIS — M069 Rheumatoid arthritis, unspecified: Secondary | ICD-10-CM | POA: Insufficient documentation

## 2011-10-14 DIAGNOSIS — IMO0001 Reserved for inherently not codable concepts without codable children: Secondary | ICD-10-CM | POA: Insufficient documentation

## 2011-10-14 MED ORDER — HYDROCODONE-ACETAMINOPHEN 7.5-325 MG PO TABS: 1.0000 | ORAL_TABLET | Freq: Four times a day (QID) | ORAL | Status: DC | PRN

## 2011-10-14 MED ORDER — FENTANYL 50 MCG/HR TD PT72: 1.0000 | MEDICATED_PATCH | TRANSDERMAL | Status: DC

## 2011-10-14 NOTE — Progress Notes (Signed)
Subjective:    Patient ID: [name], female    DOB: Feb 17, 1951, 61 y.o.   MRN: [id number]  HPI  Mrs. Fifield is back regarding her chronic pain. The increase in her hydrocodone has helped her breakthrough pain. She usually uses it every 6 hours or so. She is finding it difficult to get to sleep. Once she gets to sleep she doesn't too badly. The pamelor doesn't do a lot to help her sleep.  Left shoulder still acts up at times, especially when she abducts to 90 degrees. Once she's past that point, it feels a little better. The right shoulder isn't as bad.   She follows up with rheumatology next week. Nothing new has developed in this area since i last saw her. Her WBC's seem to be improving. She's on prednisone only at this time.  She's moving her bowels and bladder regularly.   Pain Inventory Average Pain 7 Pain Right Now 7 My pain is constant, sharp and aching  In the last 24 hours, has pain interfered with the following? General activity 7 Relation with others 7 Enjoyment of life 7 What TIME of day is your pain at its worst? morning Sleep (in general) Fair  Pain is worse with: walking, standing, some activites and raising arms Pain improves with: rest and medication Relief from Meds: 5  Mobility walk without assistance use a cane ability to climb steps?  yes do you drive?  yes  Function disabled: date disabled 2009 I need assistance with the following:  dressing, meal prep, household duties and shopping  Neuro/Psych weakness numbness tingling trouble walking dizziness depression  Prior Studies Any changes since last visit?  no  Physicians involved in your care Any changes since last visit?  no  Review of Systems  HENT: Negative.   Eyes: Negative.   Respiratory: Negative.   Cardiovascular: Negative.   Gastrointestinal: Negative.   Genitourinary: Negative.   Musculoskeletal: Negative.   Skin: Negative.   Neurological: Positive for weakness and  numbness.  Hematological: Bruises/bleeds easily.  Psychiatric/Behavioral: Negative.        Objective:   Physical Exam  Constitutional: She appears well-developed and well-nourished.  HENT:  Head: Normocephalic and atraumatic.  Eyes: Conjunctivae and EOM are normal. Pupils are equal, round, and reactive to light.  Neck: Normal range of motion. Neck supple.  Cardiovascular: Normal rate and regular rhythm.   Pulmonary/Chest: Effort normal and breath sounds normal.  Abdominal: Soft.  Musculoskeletal:       Right shoulder: She exhibits decreased range of motion, tenderness and bony tenderness.       Left shoulder: She exhibits decreased range of motion, tenderness and bony tenderness.  Neurological: She has normal strength. No cranial nerve deficit or sensory deficit. She displays a negative Romberg sign.  Reflex Scores:      Tricep reflexes are 1+ on the right side and 1+ on the left side.      Bicep reflexes are 1+ on the right side and 1+ on the left side.      Brachioradialis reflexes are 1+ on the right side and 1+ on the left side.      Patellar reflexes are 1+ on the right side and 1+ on the left side.      Achilles reflexes are 1+ on the right side and 1+ on the left side. Psychiatric: She has a normal mood and affect. Her behavior is normal. Judgment and thought content normal.  Assessment & Plan:  ASSESSMENT:  1. History of rheumatoid arthritis and fibromyalgia  2. Bilateral rotator cuff syndrome.  PLAN:  1. After informed consent, we injected both shoulders via lateral  approach with 40 mg Kenalog and 3 mL of 1% lidocaine. The patient  tolerated well.  2. I resumed fentanyl 50 mcg, but talked with the patient at length  regarding safety with her prescriptions. I also discussed the  concept of withdrawal. I told her that I would be willing to  resume the medication, but if she has further problems like this we  will have to reconsider.  3. Refilled her  hydrocodone 7.5/325 one q.8 h. p.r.n., #90.  4. The patient sees her rheumatologist tomorrow for further assessment  and possible treatment options.  5. We discussed appropriate range of motion, exercise and carrying  capacity. She had a purse with her that we at least 10 pounds  Today. 6. Discussed use of melatonin as a sleep aid. She may try 1-3 qhs.

## 2011-10-14 NOTE — Patient Instructions (Signed)
Try over the counter melatonin 1-3 tabs at bedtime to help you sleep

## 2011-10-15 ENCOUNTER — Ambulatory Visit (INDEPENDENT_AMBULATORY_CARE_PROVIDER_SITE_OTHER)
Admission: RE | Admit: 2011-10-15 | Discharge: 2011-10-15 | Disposition: A | Discharge status: Home / Self Care | Payer: Medicare Other | Source: Ambulatory Visit | Attending: Internal Medicine | Admitting: Internal Medicine

## 2011-10-15 DIAGNOSIS — R222 Localized swelling, mass and lump, trunk: Secondary | ICD-10-CM

## 2011-10-15 DIAGNOSIS — R918 Other nonspecific abnormal finding of lung field: Secondary | ICD-10-CM

## 2011-10-16 ENCOUNTER — Encounter: Payer: Self-pay | Admitting: Oncology

## 2011-10-16 ENCOUNTER — Ambulatory Visit (HOSPITAL_BASED_OUTPATIENT_CLINIC_OR_DEPARTMENT_OTHER): Payer: Medicare Other | Admitting: Oncology

## 2011-10-16 ENCOUNTER — Telehealth: Payer: Self-pay | Admitting: Oncology

## 2011-10-16 ENCOUNTER — Other Ambulatory Visit (HOSPITAL_BASED_OUTPATIENT_CLINIC_OR_DEPARTMENT_OTHER): Payer: Medicare Other | Admitting: Lab

## 2011-10-16 VITALS — BP 139/82 | HR 103 | Temp 98.4°F | Ht 62.0 in | Wt 182.6 lb

## 2011-10-16 DIAGNOSIS — M359 Systemic involvement of connective tissue, unspecified: Secondary | ICD-10-CM

## 2011-10-16 DIAGNOSIS — M0500 Felty's syndrome, unspecified site: Secondary | ICD-10-CM

## 2011-10-16 DIAGNOSIS — D72819 Decreased white blood cell count, unspecified: Secondary | ICD-10-CM

## 2011-10-16 DIAGNOSIS — D709 Neutropenia, unspecified: Secondary | ICD-10-CM

## 2011-10-16 DIAGNOSIS — R911 Solitary pulmonary nodule: Secondary | ICD-10-CM

## 2011-10-16 DIAGNOSIS — M949 Disorder of cartilage, unspecified: Secondary | ICD-10-CM

## 2011-10-16 LAB — COMPREHENSIVE METABOLIC PANEL
ALT: 19 U/L (ref 0–35)
AST: 17 U/L (ref 0–37)
Alkaline Phosphatase: 44 U/L (ref 39–117)
Potassium: 3.8 mEq/L (ref 3.5–5.3)
Sodium: 140 mEq/L (ref 135–145)
Total Bilirubin: 0.5 mg/dL (ref 0.3–1.2)
Total Protein: 7.1 g/dL (ref 6.0–8.3)

## 2011-10-16 LAB — CBC WITH DIFFERENTIAL/PLATELET
Basophils Absolute: 0 10*3/uL (ref 0.0–0.1)
EOS%: 0.1 % (ref 0.0–7.0)
Eosinophils Absolute: 0 10*3/uL (ref 0.0–0.5)
HGB: 14.3 g/dL (ref 11.6–15.9)
LYMPH%: 30.9 % (ref 14.0–49.7)
MCH: 32.1 pg (ref 25.1–34.0)
MCV: 93.3 fL (ref 79.5–101.0)
MONO%: 15.3 % — ABNORMAL HIGH (ref 0.0–14.0)
NEUT#: 1.2 10*3/uL — ABNORMAL LOW (ref 1.5–6.5)
NEUT%: 53.2 % (ref 38.4–76.8)
Platelets: 204 10*3/uL (ref 145–400)
RDW: 14.4 % (ref 11.2–14.5)

## 2011-10-16 NOTE — Telephone Encounter (Signed)
pts appts made and printed  aom

## 2011-10-16 NOTE — Progress Notes (Signed)
CC:   Thayer Headings, M.D. Ollen Gross, M.D. Barbette Hair. Arlyce Dice, MD,FACG Kalman Shan, MD Alben Deeds, MD Ranelle Oyster, M.D.  PROBLEM LIST:  1. 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 Bone marrow was carried out on 06/19/2000 and  again on 02/16/2003. There were no abnormalities noted. The bone  marrows were negative.  2. History of rheumatoid arthritis with positive rheumatoid factor,  anti neutrophil antibodies with diagnosis going back to 1986. In 2012 the patient was taking prednisone 5 mg daily.  3. Fibromyalgia with date of onset 9.  4. History of GI bleeding in the fall of 2012 with negative endoscopy  and colonoscopy by Dr. Melvia Heaps on 04/22/2011. Stools on  05/15/2011 were negative x5. The patient denies any signs of GI  bleeding at the present time.  5. Right middle lobe consolidation noted in September 2012 with most  recent CT scan of the chest carried out on 05/16/2011. These scans  showed some slight improvement as compared with the CT angiogram of  the chest carried out on 02/24/2011. Followup CT scan of the chest  is apparently scheduled for May 2013.  6. Osteopenia.  7. History of porphyria cutanea tarda diagnosed in 1992.  8. Left total knee arthroplasty by Dr. Ollen Gross on 06/19/2008. 9. Right apical lung nodule first noted on 01/13/2011.    MEDICATIONS:  1. Calcium carbonate 600 mg twice a day.  2. Vitamin D 1000 units daily.  3. Copper gluconate 2 mg daily.  4. Flexeril 10 mg at bedtime.  5. Cymbalta 60 mg twice a day.  6. Duragesic patch 50 mcg/hour, change every 3 days.  7. Neupogen 480 mcg was discontinued in mid March     2013.  8. Vicodin 5/500 twice a day as needed for pain.  9. Lyrica 200 mg twice a day.  10.Pamelor 100 mg 1 at bedtime.  11.Prilosec 20 mg twice a day.  12.Prednisone 10 mg daily.  13.Vitamin B12 1000 mcg daily.  14.Probiotic 1 capsule daily.     HISTORY:  I saw  [name] today for followup of her autoimmune neutropenia felt to be most likely due to Felty's syndrome in association with rheumatoid arthritis.  [name] was last seen by [place] on 08/21/2011.  She and her husband were in [place] from March 21st to April 9th.  She had a wonderful time there.  Her time there was uneventful.  It will be recalled that she stopped taking Neupogen in mid March.  She has remained on prednisone 10 mg daily.  The patient feels generally well.  She had a CT scan of the chest carried out yesterday on May 8.  She is without any complaints today.  PHYSICAL EXAM:  General:  The patient looks well.  Vital signs:  Weight is 182.6, pounds, height 5 feet 2 inches, body surface area 1.9 m squared.  Blood pressure 139/82.  Other vital signs are normal.  She is afebrile.  HEENT:  There is no scleral icterus.  Mouth and pharynx are benign.  No thrush.  No peripheral adenopathy.  Heart and lungs: Normal.  The patient does not have a Port-A-Cath or central catheter. Abdomen:  Obese, nontender with no organomegaly or masses palpable. Extremities:  No peripheral edema.  She has some scarring of her lower legs secondary to trauma.  There is also some purpura over the lower legs.  Neurologic:  Exam is grossly normal.  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  LABORATORY DATA:  Today, white count 2.3, ANC 1.2, hemoglobin 14.3, hematocrit 41.6, platelets 204,000.  On 04/22, white count was 5.9 with an ANC of 5.1.  On 04/11, white count was 2.9 with an ANC of 1.6 and on 03/14 white count was 8.1 with an ANC of 6.4.  Chemistries today are pending.  Chemistries from 08/21/2011 notable for a glucose of 152, otherwise normal.  Albumin was 3.8.  IMAGING STUDIES:  1. CT scan of the chest without IV contrast on 05/16/2011  showed slight improvement in the right middle lobe consolidation  containing air bronchograms. No endobronchial  lesion was identified.  It was suggested that continued radiographic follow up be carried out  to ensure resolution. I reviewed this CT scan as well as the previous  scan of 02/28/2011 with the radiologist.  2. Screening bilateral mammogram from 07/22/2011 was negative. 3. CT scan of the chest without IV contrast from 10/15/2011 showed     further improvement in the posterior right middle lobe     consolidation with air bronchograms.  This is felt to represent     resolving atelectasis with underlying scarring from prior     infections.  No endobronchial lesion was seen.  There is also a 4     mm right apical lung nodule that apparently is unchanged from     02/24/2011 and also 01/13/2011.  It was suggested that a followup     CT scan be obtained in 6-12 months.  IMPRESSION AND PLAN:  [name] continues to do well.  She has now been off the Neupogen for 2 months.  We will keep her on prednisone 10 mg daily for now in view of the fluctuating white count and ANC. Fortunately, her ANC has remained above 1.0 and she has not had any infections.  We will check CBCs every month and plan to see [name] again in approximately 4 months which will be early September at which time we will check CBC and chemistries.  As suggested in the CT report, we will probably obtain another CT scan of the chest without IV contrast sometime before the end of the year to assess the right apical lung nodule and the posterior right middle lobe consolidation.    ______________________________ Samul Dada, M.D. DSM/MEDQ  D:  10/16/2011  T:  10/16/2011  Job:  [id number]

## 2011-10-16 NOTE — Progress Notes (Signed)
This office note has been dictated.  #[id number]

## 2011-10-28 ENCOUNTER — Telehealth: Payer: Self-pay | Admitting: Internal Medicine

## 2011-10-28 DIAGNOSIS — R918 Other nonspecific abnormal finding of lung field: Secondary | ICD-10-CM

## 2011-10-28 NOTE — Telephone Encounter (Signed)
I do not see any documentation where our office called pt.  LMOM TCB x1.

## 2011-10-28 NOTE — Telephone Encounter (Signed)
I called the pt to give the following results: Notes Recorded by Kalman Shan, MD on 10/16/2011 at 5:51 PM Let her know ct chest shows right middle lobe infiltrate is now stable to shrinking further and lung nodules stable. Needs FU Ct chest without contrast 1 year. No need to come and see me now if doing well unless she wants to. Please place order for the fu CT  [id number]. Carron Curie, CMA

## 2011-10-29 NOTE — Telephone Encounter (Signed)
I called, spoke with pt.  I informed her of below CT Chest results and recs per MR.  She verbalized understanding of these results and recs and is aware MR recs she have a f/u CT w/o contrast in 1 yr.  Advised I would place order for this - someone will contact her to set up.  She verbalized understanding and voiced no further questions/concerns at this time.

## 2011-11-11 ENCOUNTER — Other Ambulatory Visit: Payer: Self-pay | Admitting: Physical Medicine & Rehabilitation

## 2011-11-13 ENCOUNTER — Other Ambulatory Visit (HOSPITAL_BASED_OUTPATIENT_CLINIC_OR_DEPARTMENT_OTHER): Payer: Medicare Other | Admitting: Lab

## 2011-11-13 DIAGNOSIS — D708 Other neutropenia: Secondary | ICD-10-CM

## 2011-11-13 DIAGNOSIS — D704 Cyclic neutropenia: Secondary | ICD-10-CM

## 2011-11-13 LAB — CBC WITH DIFFERENTIAL/PLATELET
EOS%: 0.1 % (ref 0.0–7.0)
Eosinophils Absolute: 0 10*3/uL (ref 0.0–0.5)
LYMPH%: 27.3 % (ref 14.0–49.7)
MCH: 32.5 pg (ref 25.1–34.0)
MCHC: 34.9 g/dL (ref 31.5–36.0)
MCV: 93.3 fL (ref 79.5–101.0)
MONO%: 16.8 % — ABNORMAL HIGH (ref 0.0–14.0)
Platelets: 222 10*3/uL (ref 145–400)
RBC: 4.02 10*6/uL (ref 3.70–5.45)
nRBC: 0 % (ref 0–0)

## 2011-11-14 ENCOUNTER — Encounter: Payer: Self-pay | Admitting: Physical Medicine and Rehabilitation

## 2011-11-14 ENCOUNTER — Encounter: Payer: Medicare Other | Attending: Physical Medicine & Rehabilitation | Admitting: Physical Medicine and Rehabilitation

## 2011-11-14 VITALS — BP 95/56 | HR 110 | Ht 62.0 in | Wt 185.0 lb

## 2011-11-14 DIAGNOSIS — M0500 Felty's syndrome, unspecified site: Secondary | ICD-10-CM | POA: Insufficient documentation

## 2011-11-14 DIAGNOSIS — M25569 Pain in unspecified knee: Secondary | ICD-10-CM

## 2011-11-14 DIAGNOSIS — K219 Gastro-esophageal reflux disease without esophagitis: Secondary | ICD-10-CM | POA: Insufficient documentation

## 2011-11-14 DIAGNOSIS — M25519 Pain in unspecified shoulder: Secondary | ICD-10-CM

## 2011-11-14 DIAGNOSIS — M25561 Pain in right knee: Secondary | ICD-10-CM

## 2011-11-14 DIAGNOSIS — R222 Localized swelling, mass and lump, trunk: Secondary | ICD-10-CM | POA: Insufficient documentation

## 2011-11-14 DIAGNOSIS — Z96659 Presence of unspecified artificial knee joint: Secondary | ICD-10-CM | POA: Insufficient documentation

## 2011-11-14 DIAGNOSIS — IMO0001 Reserved for inherently not codable concepts without codable children: Secondary | ICD-10-CM | POA: Insufficient documentation

## 2011-11-14 DIAGNOSIS — M19019 Primary osteoarthritis, unspecified shoulder: Secondary | ICD-10-CM

## 2011-11-14 DIAGNOSIS — M069 Rheumatoid arthritis, unspecified: Secondary | ICD-10-CM | POA: Insufficient documentation

## 2011-11-14 DIAGNOSIS — M12819 Other specific arthropathies, not elsewhere classified, unspecified shoulder: Secondary | ICD-10-CM

## 2011-11-14 DIAGNOSIS — M25511 Pain in right shoulder: Secondary | ICD-10-CM

## 2011-11-14 MED ORDER — FENTANYL 50 MCG/HR TD PT72: 1.0000 | MEDICATED_PATCH | TRANSDERMAL | Status: DC

## 2011-11-14 MED ORDER — HYDROCODONE-ACETAMINOPHEN 7.5-325 MG PO TABS: 1.0000 | ORAL_TABLET | Freq: Four times a day (QID) | ORAL | Status: DC | PRN

## 2011-11-14 NOTE — Patient Instructions (Signed)
Continue with exercises and stretches. Continue with medication. Also keep doing exercises in warm water.

## 2011-11-14 NOTE — Progress Notes (Signed)
Addended by: Judd Gaudier on: 11/14/2011 01:05 PM   Modules accepted: Orders

## 2011-11-14 NOTE — Progress Notes (Signed)
Subjective:    Patient ID: [name], female    DOB: December 24, 1950, 61 y.o.   MRN: [id number]  HPI The patient is doing very well today. She states that the current medication regimen is helping her and she is not in a lot of pain.  The problem has been stable.   Pain Inventory Average Pain 6 Pain Right Now 7 My pain is aching  In the last 24 hours, has pain interfered with the following? General activity 5 Relation with others 4 Enjoyment of life 5 What TIME of day is your pain at its worst? morning Sleep (in general) Fair  Pain is worse with: walking, standing and some activites Pain improves with: medication and injections Relief from Meds: 7  Mobility walk without assistance how many minutes can you walk? 15 ability to climb steps?  yes do you drive?  yes transfers alone Do you have any goals in this area?  yes  Function disabled: date disabled 09 I need assistance with the following:  dressing, meal prep, household duties and shopping Do you have any goals in this area?  yes  Neuro/Psych weakness tingling trouble walking depression anxiety  Prior Studies Any changes since last visit?  no  Physicians involved in your care Any changes since last visit?  no   Family History  Problem Relation Age of Onset  . Clotting disorder Mother     Coumadin  . Allergies Mother   . Heart disease Mother   . Stroke Father   . Heart disease Father   . Alzheimer's disease Father    History   Social History  . Marital Status: Married    Spouse Name: N/A    Number of Children: 1  . Years of Education: N/A   Occupational History  . disability    Social History Main Topics  . Smoking status: Current Some Day Smoker -- 0.2 packs/day for 40 years    Types: Cigarettes    Last Attempt to Quit: 03/09/2009  . Smokeless tobacco: Never Used  . Alcohol Use: Yes     social  . Drug Use: No  . Sexually Active: None   Other Topics Concern  . None   Social  History Narrative  . None   Past Surgical History  Procedure Date  . Total abdominal hysterectomy   . Tubal ligation   . Appendectomy   . Total knee arthroplasty     left  . Rotator cuff repair   . Foot fracture surgery   . Synovectomy of l thumb   . Fusion of r wrist   . Upper palate surgery for tmj syndrome   . Replacement total knee     left   Past Medical History  Diagnosis Date  . Diverticulosis   . Fibromyalgia   . Anemia   . Rheumatoid arthritis   . Anxiety   . Blood transfusion   . GERD (gastroesophageal reflux disease)   . Myalgia   . Rheumatoid arthritis   . Depression   . Joint effusion   . Calcifying tendinitis of shoulder   . Myalgia and myositis    BP 95/56  Pulse 110  Ht 5\' 2"  (1.575 m)  Wt 185 lb (83.915 kg)  BMI 33.84 kg/m2  SpO2 95%     Review of Systems  Neurological: Positive for weakness.  Psychiatric/Behavioral: Positive for dysphoric mood.  All other systems reviewed and are negative.       Objective:   Physical Exam  Constitutional: She is oriented to person, place, and time. She appears well-developed and well-nourished.  HENT:  Head: Normocephalic.  Neck: Normal range of motion.  Musculoskeletal: She exhibits tenderness.  Neurological: She is alert and oriented to person, place, and time.  Skin: Skin is warm and dry.  Psychiatric: She has a normal mood and affect.     Symmetric normal motor tone is noted throughout. Normal muscle bulk, except atrophy on her hands bilateral. Muscle testing reveals 5/5 muscle strength of the upper extremity, except wrist and finger muscles, and 5/5 of the lower extremity. Full range of motion in upper and lower extremities, except shoulders and wrist/ fingers bilateral. ROM of spine is  restricted. Fine motor movements are restricted in both hands.  DTR in the upper and lower extremity are present and symmetric 2+. No clonus is noted.  Patient arises from chair without difficulty. Narrow  based gait with normal arm swing bilateral .      Assessment & Plan:  This  A 61 year old female  with 1. Rheumatid arthritis 2. Fibromyalgia 3. Felty's syndrome 4. Mass in lung Plan : Continue with medication. Continue with your exercise program. The patient is doing pretty well with the current medication regimen. Follow up in 1 month.

## 2011-12-12 ENCOUNTER — Other Ambulatory Visit (HOSPITAL_BASED_OUTPATIENT_CLINIC_OR_DEPARTMENT_OTHER): Payer: Medicare Other | Admitting: Lab

## 2011-12-12 DIAGNOSIS — D708 Other neutropenia: Secondary | ICD-10-CM

## 2011-12-12 DIAGNOSIS — M359 Systemic involvement of connective tissue, unspecified: Secondary | ICD-10-CM

## 2011-12-12 LAB — CBC WITH DIFFERENTIAL/PLATELET
BASO%: 0.7 % (ref 0.0–2.0)
EOS%: 0.2 % (ref 0.0–7.0)
HCT: 35.8 % (ref 34.8–46.6)
LYMPH%: 45.5 % (ref 14.0–49.7)
MCH: 31.8 pg (ref 25.1–34.0)
MCHC: 34.3 g/dL (ref 31.5–36.0)
NEUT%: 37.7 % — ABNORMAL LOW (ref 38.4–76.8)
lymph#: 1.1 10*3/uL (ref 0.9–3.3)

## 2011-12-16 ENCOUNTER — Encounter: Payer: Medicare Other | Attending: Physical Medicine & Rehabilitation | Admitting: Physical Medicine and Rehabilitation

## 2011-12-16 ENCOUNTER — Encounter: Payer: Self-pay | Admitting: Physical Medicine and Rehabilitation

## 2011-12-16 VITALS — BP 116/72 | HR 102 | Resp 14 | Ht 62.0 in | Wt 184.0 lb

## 2011-12-16 DIAGNOSIS — M0500 Felty's syndrome, unspecified site: Secondary | ICD-10-CM | POA: Insufficient documentation

## 2011-12-16 DIAGNOSIS — M25561 Pain in right knee: Secondary | ICD-10-CM

## 2011-12-16 DIAGNOSIS — M069 Rheumatoid arthritis, unspecified: Secondary | ICD-10-CM | POA: Insufficient documentation

## 2011-12-16 DIAGNOSIS — M25569 Pain in unspecified knee: Secondary | ICD-10-CM

## 2011-12-16 DIAGNOSIS — IMO0001 Reserved for inherently not codable concepts without codable children: Secondary | ICD-10-CM | POA: Insufficient documentation

## 2011-12-16 DIAGNOSIS — M19019 Primary osteoarthritis, unspecified shoulder: Secondary | ICD-10-CM

## 2011-12-16 DIAGNOSIS — R222 Localized swelling, mass and lump, trunk: Secondary | ICD-10-CM | POA: Insufficient documentation

## 2011-12-16 DIAGNOSIS — M12819 Other specific arthropathies, not elsewhere classified, unspecified shoulder: Secondary | ICD-10-CM

## 2011-12-16 MED ORDER — HYDROCODONE-ACETAMINOPHEN 7.5-325 MG PO TABS: 1.0000 | ORAL_TABLET | Freq: Four times a day (QID) | ORAL | Status: DC | PRN

## 2011-12-16 MED ORDER — FENTANYL 50 MCG/HR TD PT72: 1.0000 | MEDICATED_PATCH | TRANSDERMAL | Status: DC

## 2011-12-16 NOTE — Progress Notes (Signed)
Subjective:    Patient ID: [name], female    DOB: 09-01-1950, 61 y.o.   MRN: [id number]  HPI The patient is doing very well today. She states that the current medication regimen is helping her and she is not in a lot of pain. She states that she works out in [employer] regularly. The problem has been stable.   Pain Inventory Average Pain 5 Pain Right Now 5 My pain is aching  In the last 24 hours, has pain interfered with the following? General activity 3 Relation with others 2 Enjoyment of life 3 What TIME of day is your pain at its worst? morning Sleep (in general) Good  Pain is worse with: walking, bending and some activites Pain improves with: rest, medication and injections Relief from Meds: 7  Mobility walk without assistance how many minutes can you walk? 20 ability to climb steps?  yes do you drive?  yes transfers alone Do you have any goals in this area?  no  Function disabled: date disabled 01/2008 I need assistance with the following:  dressing, meal prep, household duties and shopping Do you have any goals in this area?  no  Neuro/Psych weakness numbness tingling spasms depression anxiety  Prior Studies Any changes since last visit?  no  Physicians involved in your care Any changes since last visit?  no   Family History  Problem Relation Age of Onset  . Clotting disorder Mother     Coumadin  . Allergies Mother   . Heart disease Mother   . Stroke Father   . Heart disease Father   . Alzheimer's disease Father    History   Social History  . Marital Status: Married    Spouse Name: N/A    Number of Children: 1  . Years of Education: N/A   Occupational History  . disability    Social History Main Topics  . Smoking status: Current Some Day Smoker -- 0.2 packs/day for 40 years    Types: Cigarettes    Last Attempt to Quit: 03/09/2009  . Smokeless tobacco: Never Used  . Alcohol Use: Yes     social  . Drug Use: No  . Sexually  Active: None   Other Topics Concern  . None   Social History Narrative  . None   Past Surgical History  Procedure Date  . Total abdominal hysterectomy   . Tubal ligation   . Appendectomy   . Total knee arthroplasty     left  . Rotator cuff repair   . Foot fracture surgery   . Synovectomy of l thumb   . Fusion of r wrist   . Upper palate surgery for tmj syndrome   . Replacement total knee     left   Past Medical History  Diagnosis Date  . Diverticulosis   . Fibromyalgia   . Anemia   . Rheumatoid arthritis   . Anxiety   . Blood transfusion   . GERD (gastroesophageal reflux disease)   . Myalgia   . Rheumatoid arthritis   . Depression   . Joint effusion   . Calcifying tendinitis of shoulder   . Myalgia and myositis    BP 116/72  Pulse 102  Resp 14  Ht 5\' 2"  (1.575 m)  Wt 184 lb (83.462 kg)  BMI 33.65 kg/m2  SpO2 98%     Review of Systems  Musculoskeletal: Positive for arthralgias.  Neurological: Positive for weakness and numbness.  Psychiatric/Behavioral: Positive for dysphoric  mood. The patient is nervous/anxious.   All other systems reviewed and are negative.       Objective:   Physical Exam Constitutional: She is oriented to person, place, and time. She appears well-developed and well-nourished.  HENT:  Head: Normocephalic.  Neck: Normal range of motion.  Musculoskeletal: She exhibits tenderness.  Neurological: She is alert and oriented to person, place, and time.  Skin: Skin is warm and dry.  Psychiatric: She has a normal mood and affect.   Symmetric normal motor tone is noted throughout. Normal muscle bulk, except atrophy on her hands bilateral. Muscle testing reveals 5/5 muscle strength of the upper extremity, except wrist and finger muscles, and 5/5 of the lower extremity. Full range of motion in upper and lower extremities, except shoulders and wrist/ fingers bilateral. ROM of spine is restricted. Fine motor movements are restricted in both  hands.  DTR in the upper and lower extremity are present and symmetric 2+. No clonus is noted.  Patient arises from chair without difficulty. Narrow based gait with normal arm swing bilateral .        Assessment & Plan:  This A 61 year old female with  1. Rheumatid arthritis  2. Fibromyalgia  3. Felty's syndrome  4. Mass in lung  Plan :  Continue with medication. Continue with your exercise program. Start water aerobics. The patient is doing pretty well with the current medication regimen.  Follow up in 1 month.

## 2011-12-16 NOTE — Patient Instructions (Signed)
Continue with exercising in the gym, continue with medications. Start with water aerobics.

## 2011-12-22 IMAGING — US US ABDOMEN LIMITED
1 series · 14 of 25 positions shown · non-contrast
Comparison: CT dated 01/21/2011 and prior abdominal ultrasound
dated 06/21/2009.

CLINICAL DATA: Right upper quadrant abdominal pain and known
gallstone.

LIMITED ABDOMINAL ULTRASOUND - RIGHT UPPER QUADRANT

[Series 1: us abdomen limited · 0.28mm/px · 14 of 58 slices shown]
[im 1/58]
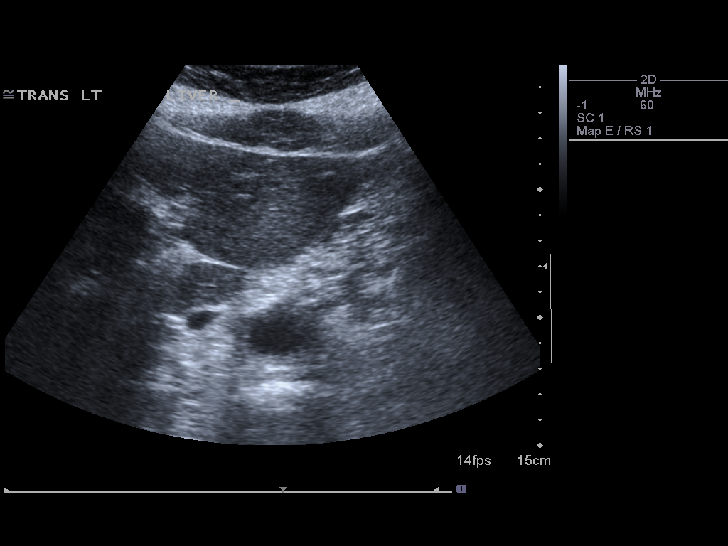
[im 5/58]
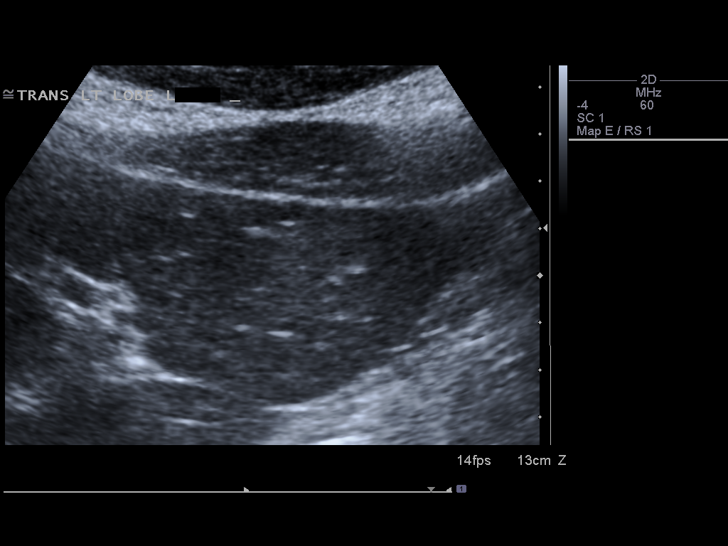
[im 10/58]
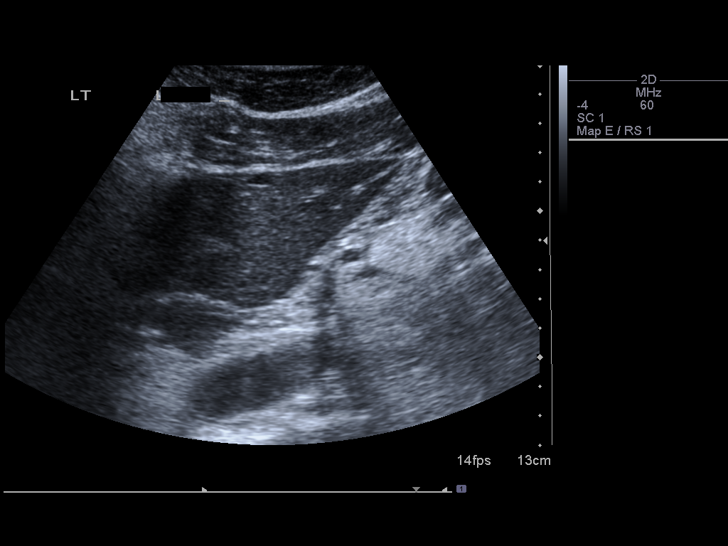
[im 15/58]
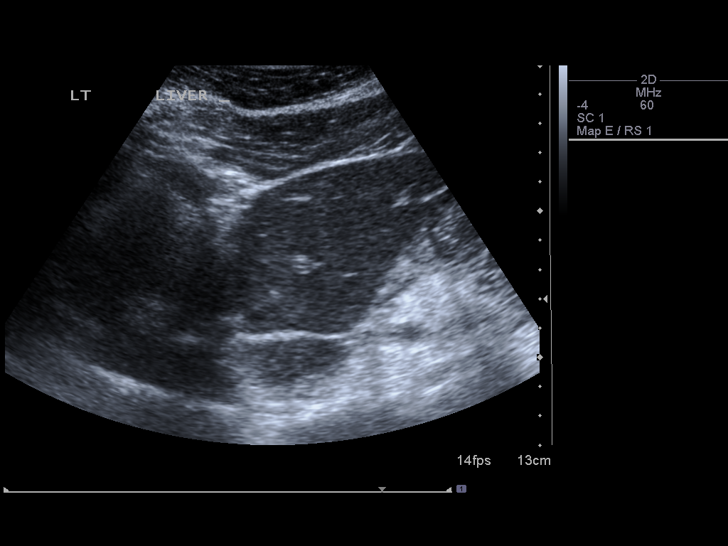
[im 20/58]
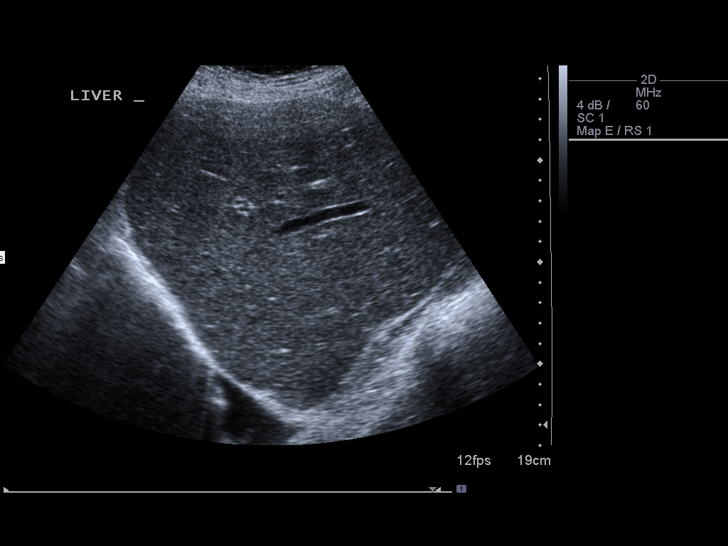
[im 22/58]
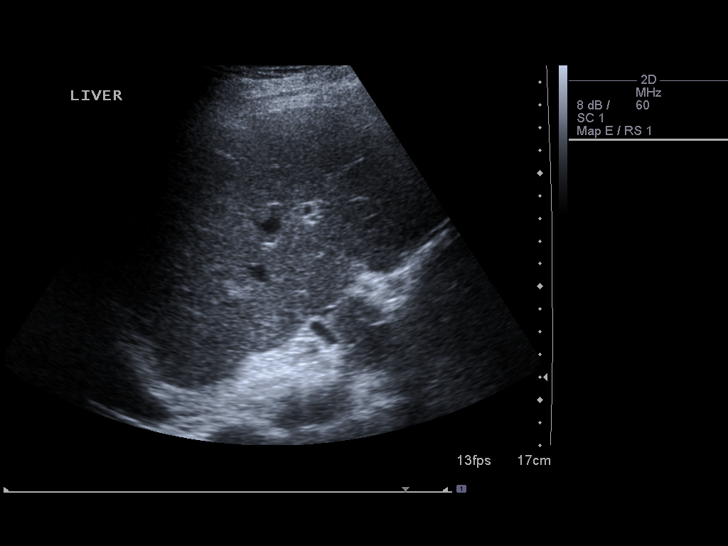
[im 27/58]
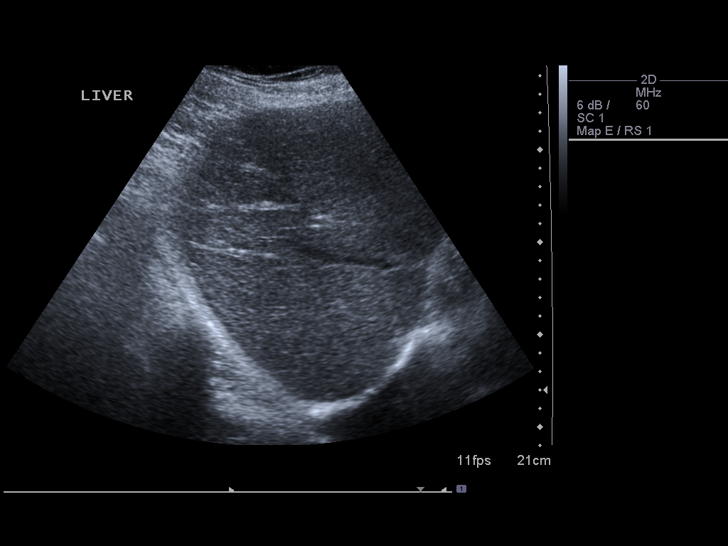
[im 31/58]
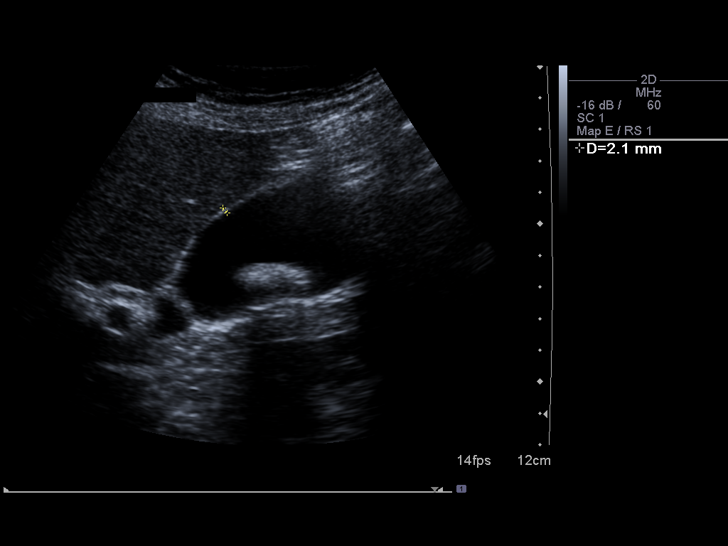
[im 36/58]
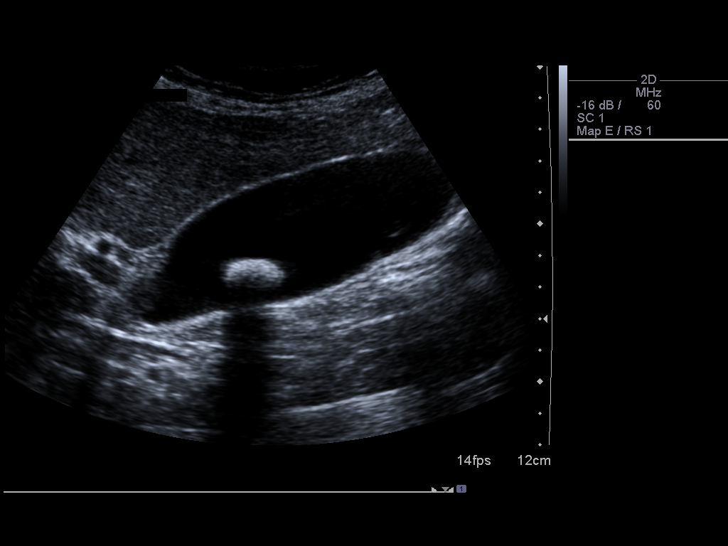
[im 39/58]
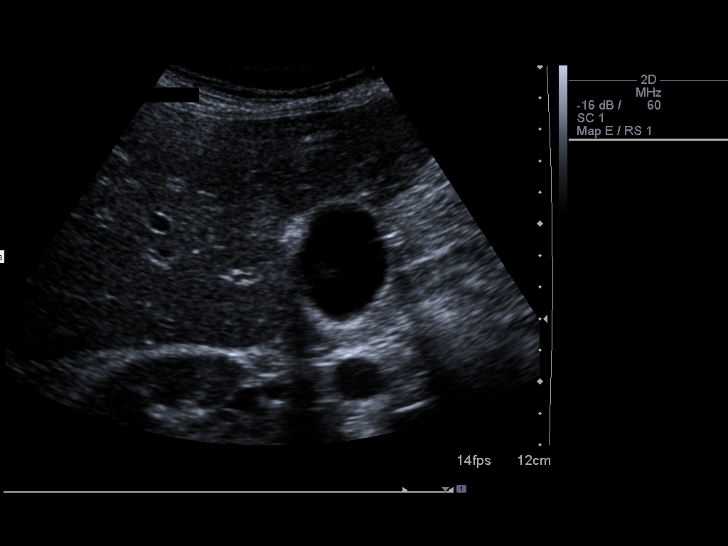
[im 43/58]
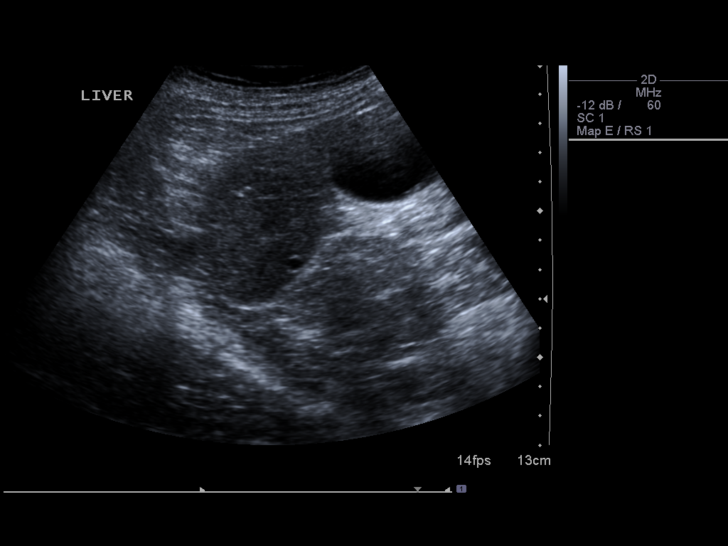
[im 48/58]
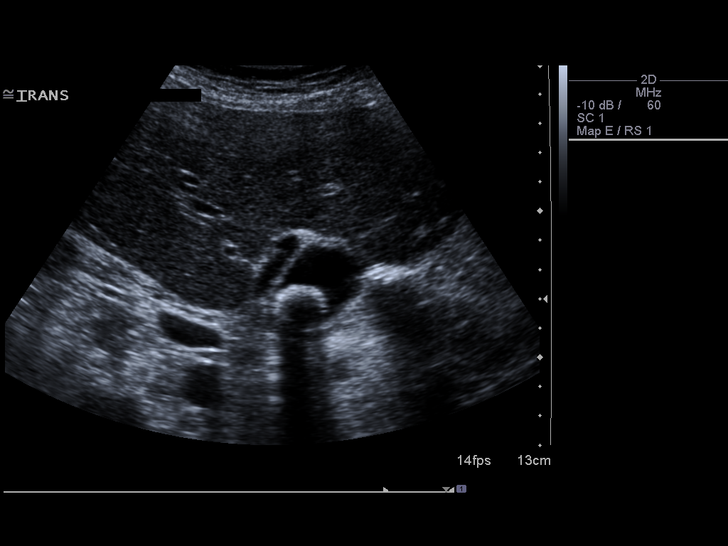
[im 53/58]
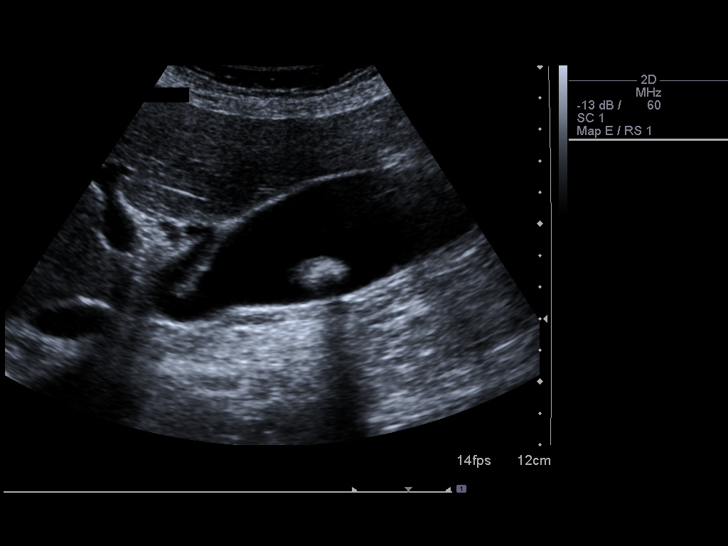
[im 58/58]
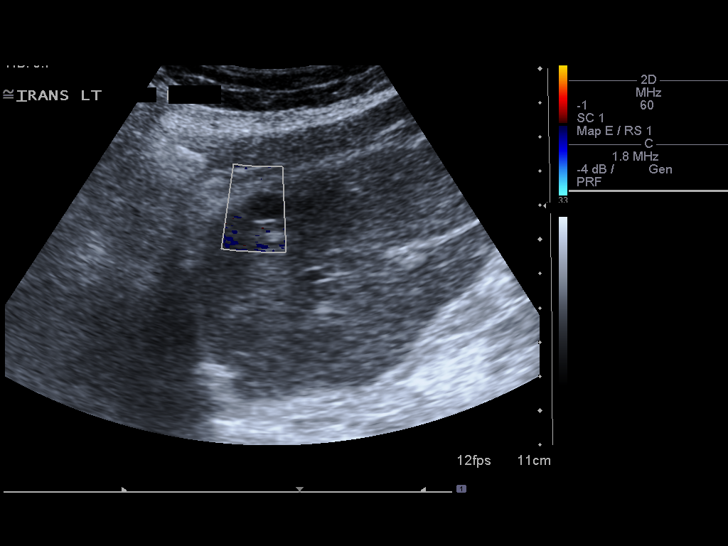

[14 of 25 positions shown; findings below may reference images not displayed]

FINDINGS: Gallbladder:  Single shadowing and mobile gallstone measures
approximately 2.3 cm in diameter.  No evidence of associated
gallbladder wall thickening, pericholecystic fluid or sonographic
Murphy's sign.

Common bile duct:  Normal caliber of 3 mm.

Liver:  Small cysts in the left lobe of the liver have a benign
appearance.  Largest cyst measures approximately 11 mm.  This was
seen on recent CT.  The liver is otherwise normal.  The portal vein
is open.
IMPRESSION: Solitary mobile gallstone which has increased in size since prior
ultrasound in 9200.  There are no other signs to suggest biliary
obstruction or cholecystitis.

## 2011-12-26 IMAGING — CR DG CHEST 2V
2 series · 2 of 2 positions shown · non-contrast
Comparison: 02/07/2011

CLINICAL DATA: Chest pain and shortness of breath.  Right pleural
effusion.

CHEST - 2 VIEW

[w chest lat]
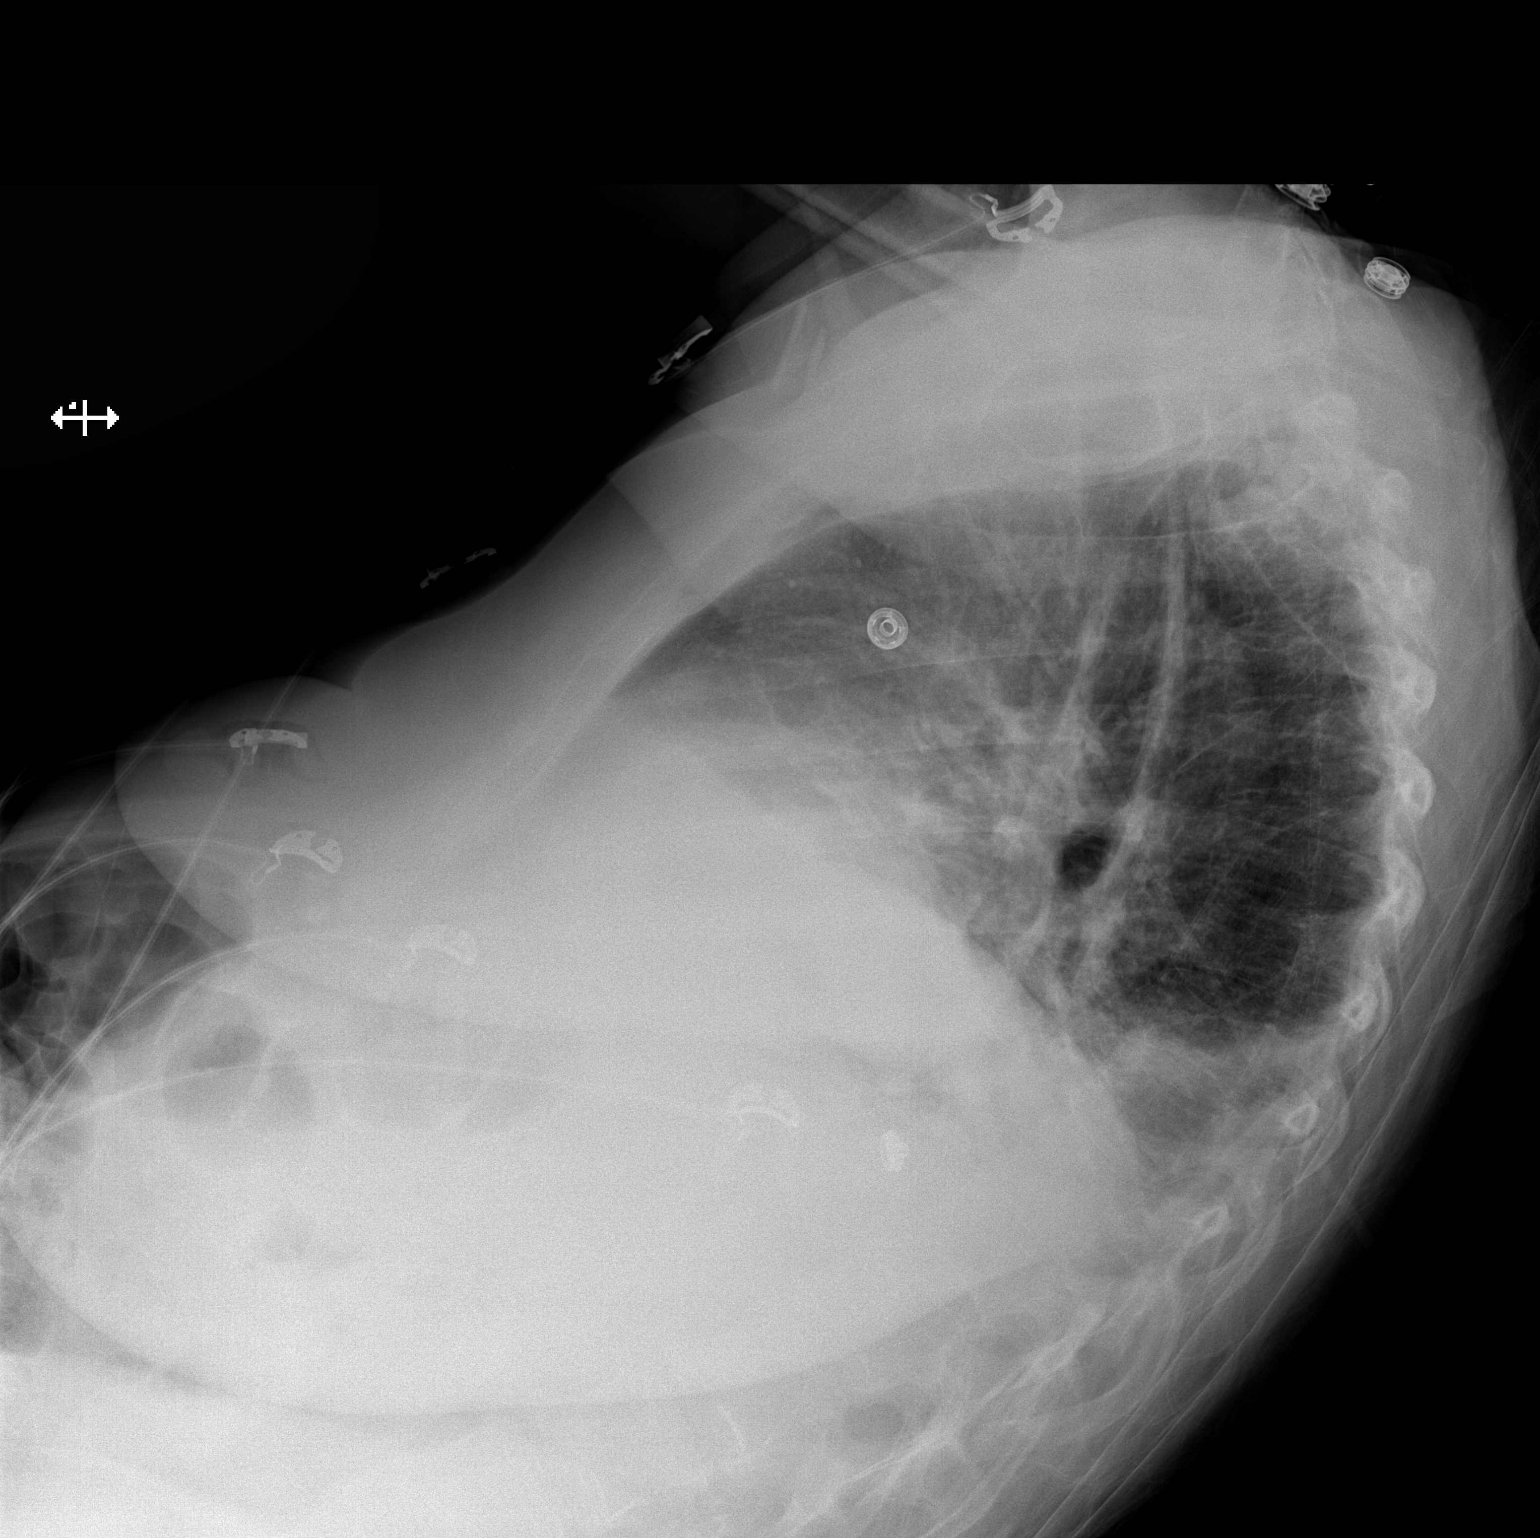

[x chest ap]
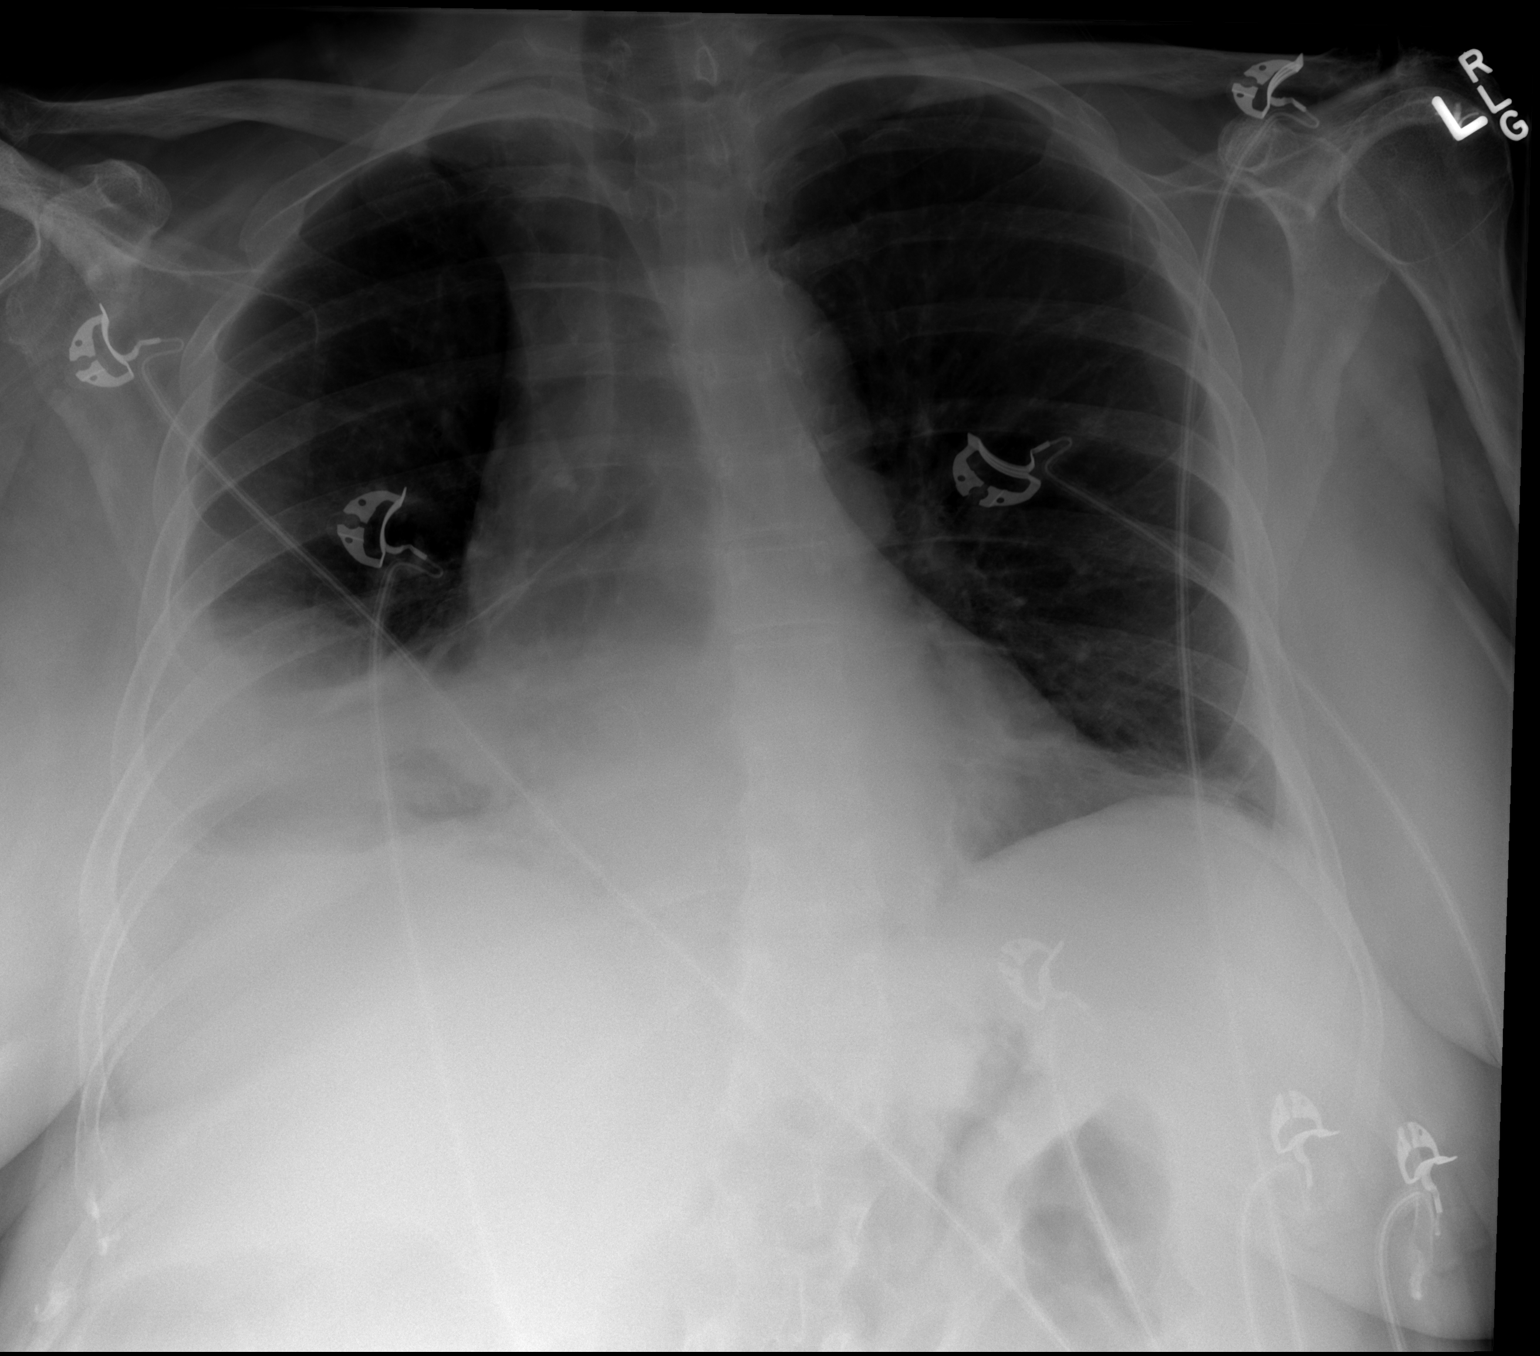

[2 of 2 positions shown; findings below may reference images not displayed]

FINDINGS: Increased small right pleural effusion is seen as well as
increased right lower lung infiltrate and/or atelectasis. New mild
atelectasis also seen at left lung base.  Heart size remains
stable.
IMPRESSION: Increased small right pleural effusion and right lower lung
infiltrate and/or atelectasis.

## 2011-12-26 IMAGING — CR DG PELVIS 1-2V
1 series · 1 of 1 positions shown · non-contrast
Comparison: CT dated 01/21/2011

CLINICAL DATA: Fall

PELVIS - 1-2 VIEW

[t pelvis ap]
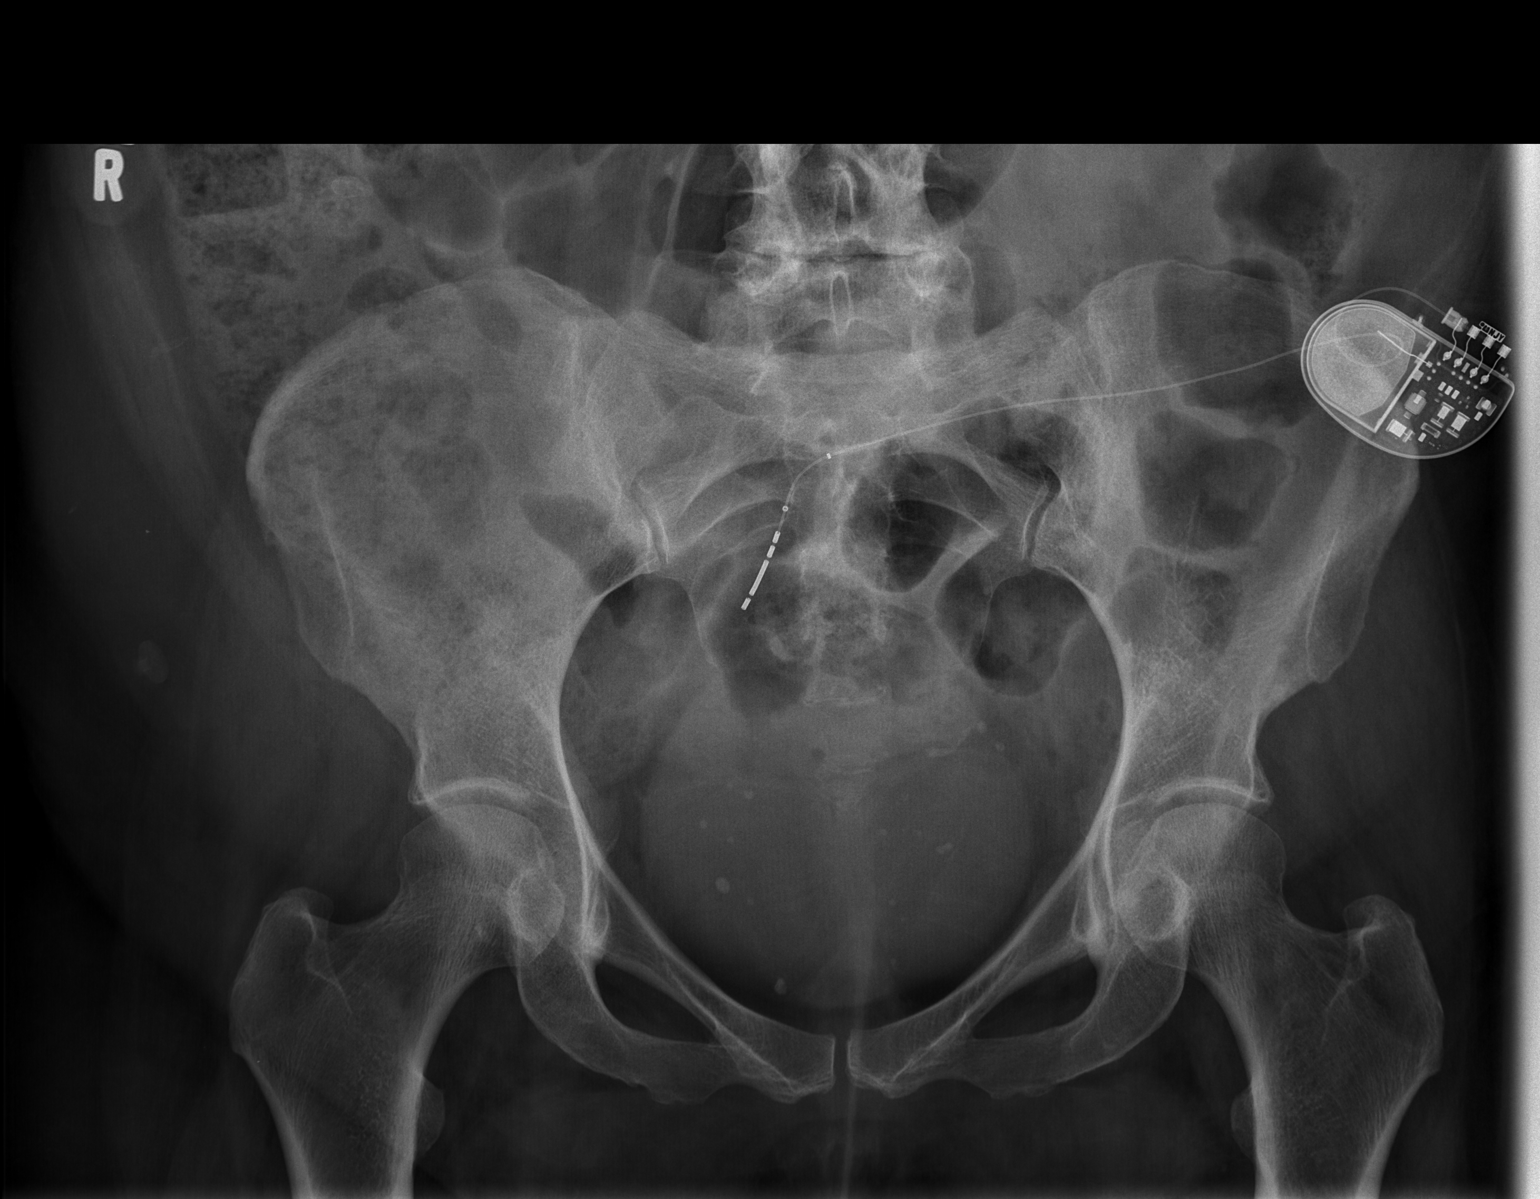

[1 of 1 positions shown; findings below may reference images not displayed]

FINDINGS: No fracture or dislocation is seen.

Bilateral hip joint spaces are symmetric.

Degenerative changes of the lower lumbar spine.

Sacral stimulator.
IMPRESSION: No fracture or dislocation is seen.

## 2011-12-26 IMAGING — CR DG CERVICAL SPINE COMPLETE 4+V
7 series · 7 of 7 positions shown · non-contrast
Comparison: None.

CLINICAL DATA: Fall, confusion

CERVICAL SPINE - COMPLETE 4+ VIEW

[w cervical spine lat]
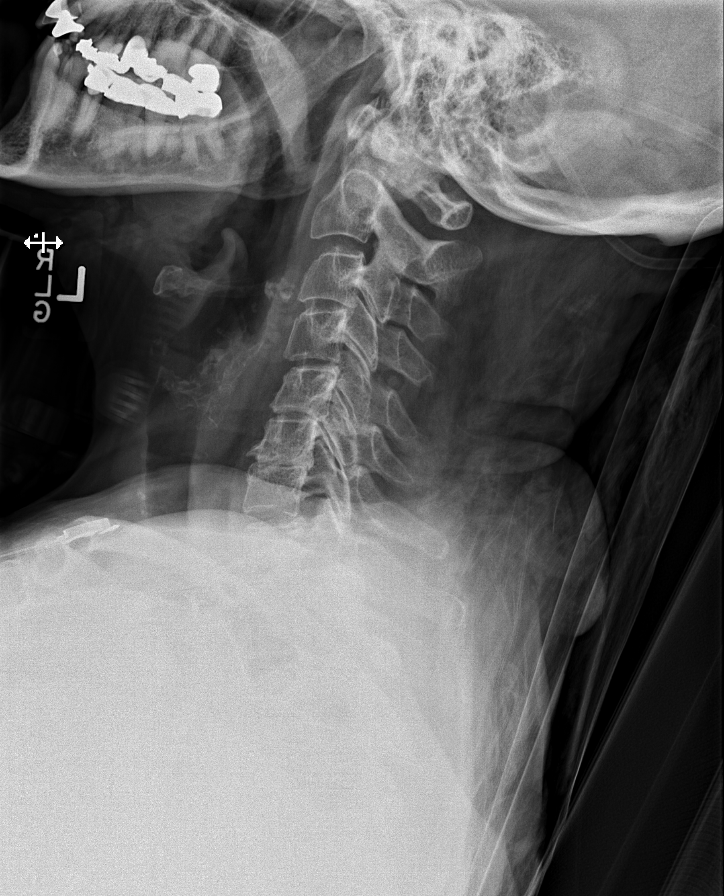

[t cervical spine ap]
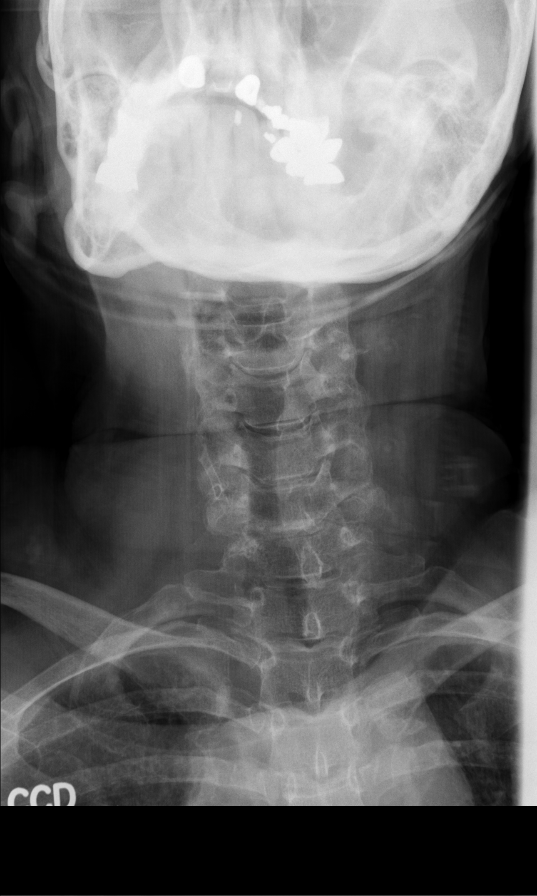

[t cervical spine odontoid (1 of 3)]
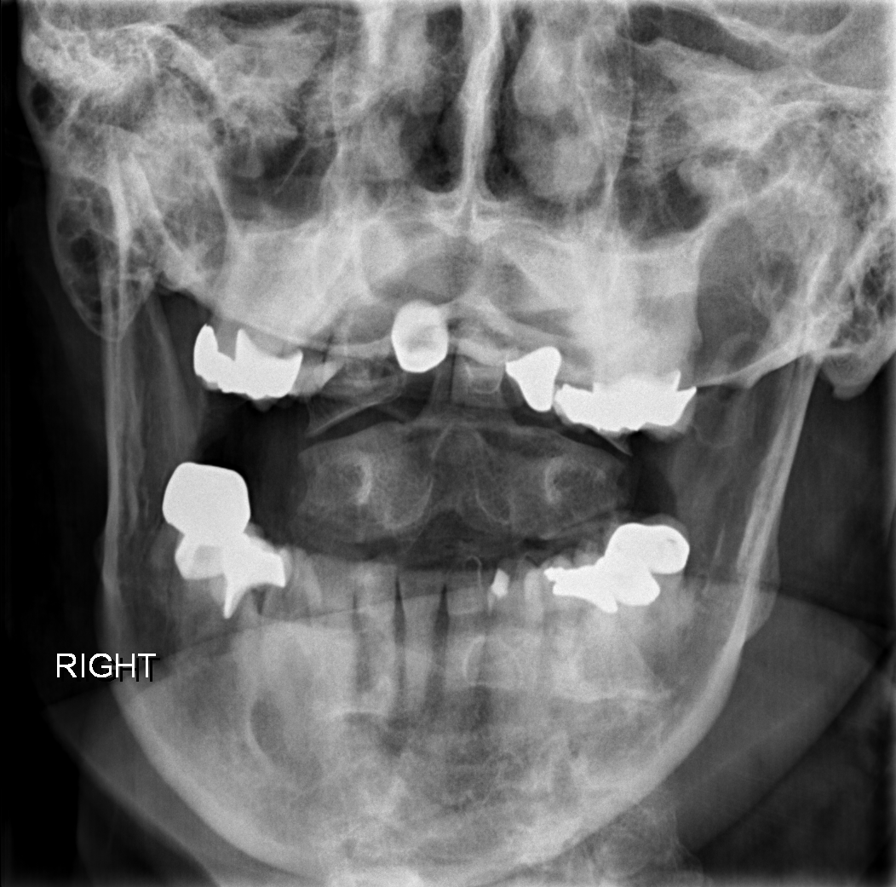

[t cervical spine odontoid (2 of 3)]
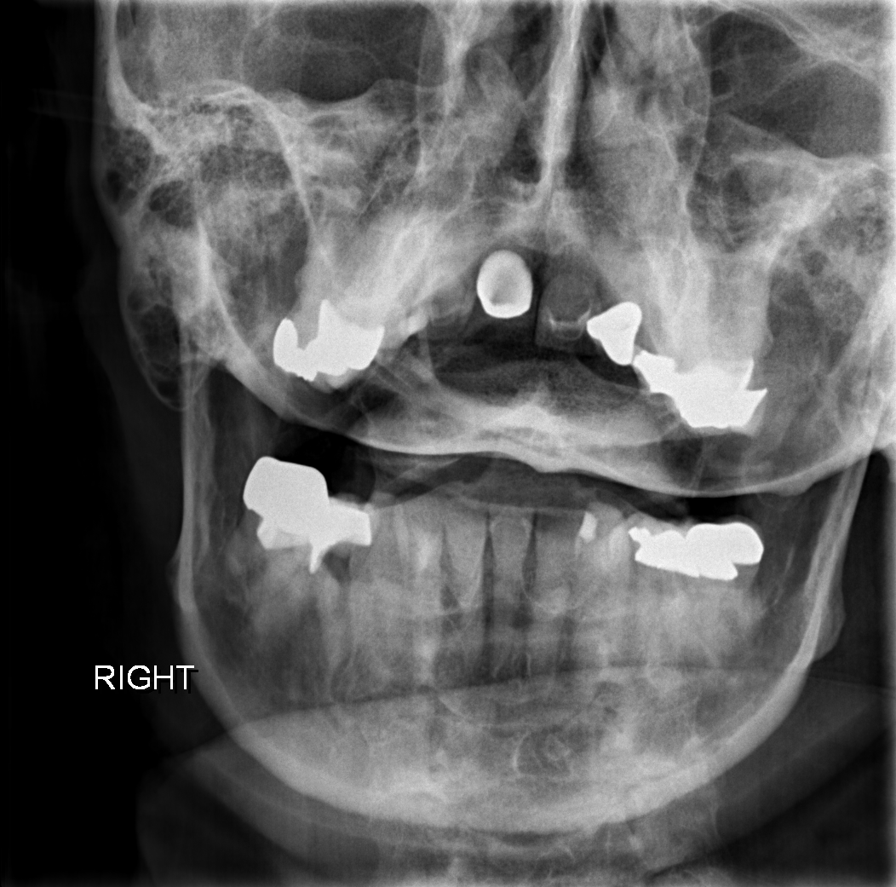

[t cervical spine odontoid (3 of 3)]
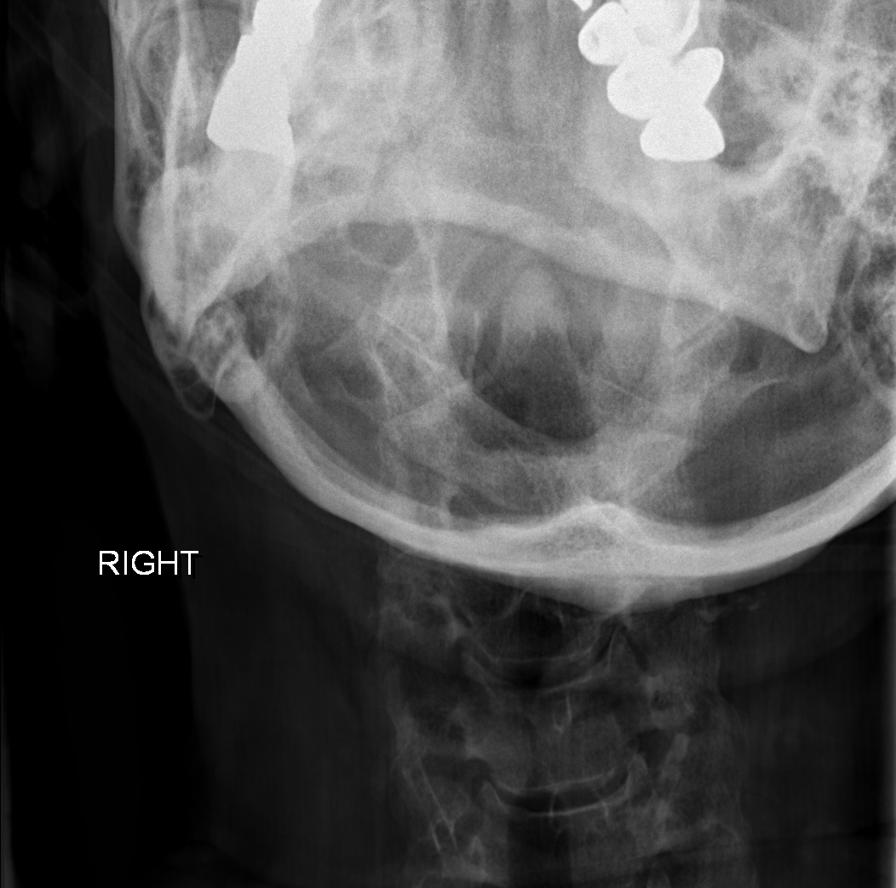

[t cervical spine obl (1 of 2)]
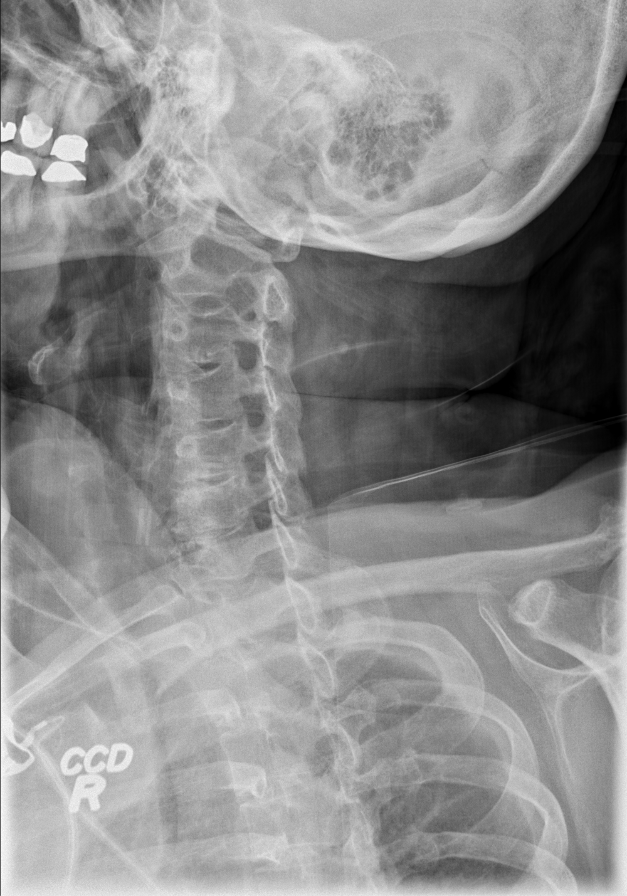

[t cervical spine obl (2 of 2)]
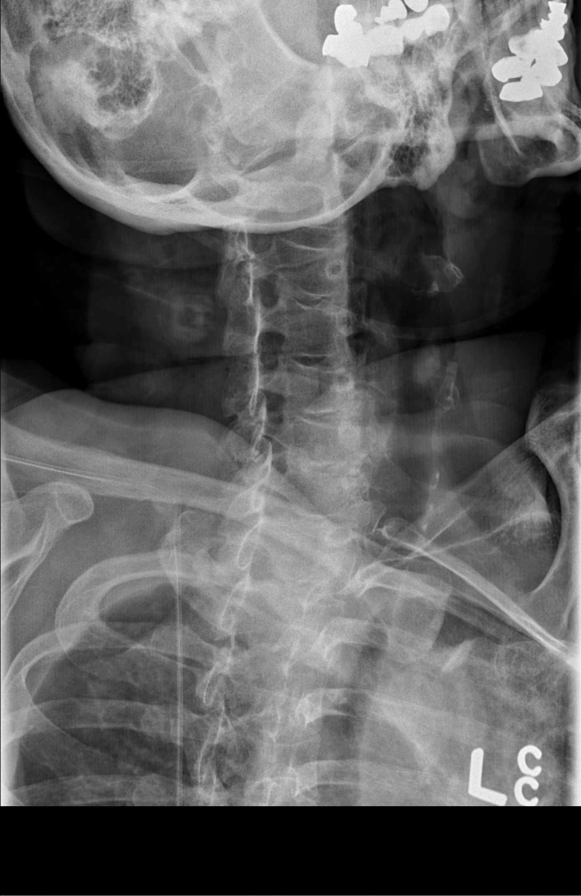

[7 of 7 positions shown; findings below may reference images not displayed]

FINDINGS: The cervical spine is visualized to C7-T1 on the lateral
view.

Straightening of the cervical spine. Mild anterolisthesis of C7 on
T1.

No evidence of fracture or dislocation. The vertebral body heights
are maintained.  The dens appears intact.  The lateral masses of C1
are symmetric.

No prevertebral soft tissue swelling.

The bilateral neural foramina appear patent.

Degenerative changes, most severe at C6-7.
IMPRESSION: No fracture or dislocation is seen.

Degenerate changes, most severe at C6-7.  Mild anterolisthesis of
C7 on T1.

## 2012-01-13 ENCOUNTER — Encounter: Payer: Self-pay | Admitting: Physical Medicine and Rehabilitation

## 2012-01-13 ENCOUNTER — Encounter
Payer: Medicare Other | Attending: Physical Medicine and Rehabilitation | Admitting: Physical Medicine and Rehabilitation

## 2012-01-13 VITALS — BP 130/61 | HR 107 | Resp 14 | Ht 62.0 in | Wt 189.0 lb

## 2012-01-13 DIAGNOSIS — R222 Localized swelling, mass and lump, trunk: Secondary | ICD-10-CM | POA: Insufficient documentation

## 2012-01-13 DIAGNOSIS — M797 Fibromyalgia: Secondary | ICD-10-CM

## 2012-01-13 DIAGNOSIS — M069 Rheumatoid arthritis, unspecified: Secondary | ICD-10-CM | POA: Insufficient documentation

## 2012-01-13 DIAGNOSIS — M19019 Primary osteoarthritis, unspecified shoulder: Secondary | ICD-10-CM

## 2012-01-13 DIAGNOSIS — IMO0001 Reserved for inherently not codable concepts without codable children: Secondary | ICD-10-CM | POA: Insufficient documentation

## 2012-01-13 DIAGNOSIS — M0500 Felty's syndrome, unspecified site: Secondary | ICD-10-CM | POA: Insufficient documentation

## 2012-01-13 DIAGNOSIS — M12819 Other specific arthropathies, not elsewhere classified, unspecified shoulder: Secondary | ICD-10-CM

## 2012-01-13 MED ORDER — HYDROCODONE-ACETAMINOPHEN 7.5-325 MG PO TABS: 1.0000 | ORAL_TABLET | Freq: Four times a day (QID) | ORAL | Status: DC | PRN

## 2012-01-13 MED ORDER — FENTANYL 50 MCG/HR TD PT72: 1.0000 | MEDICATED_PATCH | TRANSDERMAL | Status: DC

## 2012-01-13 NOTE — Progress Notes (Signed)
Subjective:    Patient ID: [name], female    DOB: 11-Dec-1950, 61 y.o.   MRN: [id number]  HPI The patient is doing very well today. She states that the current medication regimen is helping her and she is not in a lot of pain. She states that she works out in [employer] regularly, and that she has started water aerobics, which she enjoys.  The problem has been stable.   Pain Inventory Average Pain 5 Pain Right Now 4 My pain is sharp and stabbing  In the last 24 hours, has pain interfered with the following? General activity 4 Relation with others 3 Enjoyment of life 4 What TIME of day is your pain at its worst? morning and night Sleep (in general) Fair  Pain is worse with: walking and some activites Pain improves with: rest and medication Relief from Meds: 7  Mobility walk without assistance how many minutes can you walk? 15 ability to climb steps?  yes do you drive?  yes Do you have any goals in this area?  no  Function disabled: date disabled 2009 I need assistance with the following:  meal prep, household duties and shopping Do you have any goals in this area?  no  Neuro/Psych weakness numbness depression anxiety  Prior Studies Any changes since last visit?  no  Physicians involved in your care Any changes since last visit?  no   Family History  Problem Relation Age of Onset  . Clotting disorder Mother     Coumadin  . Allergies Mother   . Heart disease Mother   . Stroke Father   . Heart disease Father   . Alzheimer's disease Father    History   Social History  . Marital Status: Married    Spouse Name: N/A    Number of Children: 1  . Years of Education: N/A   Occupational History  . disability    Social History Main Topics  . Smoking status: Current Some Day Smoker -- 0.2 packs/day for 40 years    Types: Cigarettes    Last Attempt to Quit: 03/09/2009  . Smokeless tobacco: Never Used  . Alcohol Use: Yes     social  . Drug Use: No    . Sexually Active: None   Other Topics Concern  . None   Social History Narrative  . None   Past Surgical History  Procedure Date  . Total abdominal hysterectomy   . Tubal ligation   . Appendectomy   . Total knee arthroplasty     left  . Rotator cuff repair   . Foot fracture surgery   . Synovectomy of l thumb   . Fusion of r wrist   . Upper palate surgery for tmj syndrome   . Replacement total knee     left   Past Medical History  Diagnosis Date  . Diverticulosis   . Fibromyalgia   . Anemia   . Rheumatoid arthritis   . Anxiety   . Blood transfusion   . GERD (gastroesophageal reflux disease)   . Myalgia   . Rheumatoid arthritis   . Depression   . Joint effusion   . Calcifying tendinitis of shoulder   . Myalgia and myositis    BP 130/61  Pulse 107  Resp 14  Ht 5\' 2"  (1.575 m)  Wt 189 lb (85.73 kg)  BMI 34.57 kg/m2  SpO2 91%     Review of Systems  Musculoskeletal: Positive for joint swelling, arthralgias and  gait problem.  Neurological: Positive for weakness and numbness.  Psychiatric/Behavioral: Positive for dysphoric mood. The patient is nervous/anxious.   All other systems reviewed and are negative.       Objective:   Physical Exam Constitutional: She is oriented to person, place, and time. She appears well-developed and well-nourished.  HENT:  Head: Normocephalic.  Neck: Normal range of motion.  Musculoskeletal: She exhibits tenderness.  Neurological: She is alert and oriented to person, place, and time.  Skin: Skin is warm and dry.  Psychiatric: She has a normal mood and affect.  Symmetric normal motor tone is noted throughout. Normal muscle bulk, except atrophy on her hands bilateral. Muscle testing reveals 5/5 muscle strength of the upper extremity, except wrist and finger muscles, and 5/5 of the lower extremity. Full range of motion in upper and lower extremities, except shoulders and wrist/ fingers bilateral. ROM of spine is restricted.  Fine motor movements are restricted in both hands.  DTR in the upper and lower extremity are present and symmetric 2+. No clonus is noted.  Patient arises from chair without difficulty. Narrow based gait with normal arm swing bilateral .        Assessment & Plan:  This A 61 year old female with  1. Rheumatid arthritis  2. Fibromyalgia  3. Felty's syndrome  4. Mass in lung  Plan :  Continue with medication. Continue with your exercise program.Patient has started water aerobics, which has helped her some. The patient is doing pretty well with the current medication regimen.  Follow up in 1 month.

## 2012-01-13 NOTE — Patient Instructions (Signed)
Continue with exercising in the gym and in the pool 

## 2012-01-15 ENCOUNTER — Other Ambulatory Visit: Payer: Medicare Other | Admitting: Lab

## 2012-01-15 DIAGNOSIS — M359 Systemic involvement of connective tissue, unspecified: Secondary | ICD-10-CM

## 2012-01-15 DIAGNOSIS — D709 Neutropenia, unspecified: Secondary | ICD-10-CM

## 2012-01-15 LAB — CBC WITH DIFFERENTIAL/PLATELET
BASO%: 0.6 % (ref 0.0–2.0)
Basophils Absolute: 0 10*3/uL (ref 0.0–0.1)
HCT: 35.6 % (ref 34.8–46.6)
HGB: 12.1 g/dL (ref 11.6–15.9)
MONO#: 0.5 10*3/uL (ref 0.1–0.9)
NEUT%: 24.2 % — ABNORMAL LOW (ref 38.4–76.8)
WBC: 1.6 10*3/uL — ABNORMAL LOW (ref 3.9–10.3)
lymph#: 0.8 10*3/uL — ABNORMAL LOW (ref 0.9–3.3)

## 2012-01-19 ENCOUNTER — Other Ambulatory Visit: Payer: Self-pay | Admitting: Physical Medicine & Rehabilitation

## 2012-01-27 ENCOUNTER — Telehealth: Payer: Self-pay | Admitting: Oncology

## 2012-01-27 NOTE — Telephone Encounter (Signed)
pt aware that 9/13 had been moved to 9/23

## 2012-02-11 ENCOUNTER — Encounter: Payer: Self-pay | Admitting: Physical Medicine and Rehabilitation

## 2012-02-11 ENCOUNTER — Encounter
Payer: Medicare Other | Attending: Physical Medicine and Rehabilitation | Admitting: Physical Medicine and Rehabilitation

## 2012-02-11 VITALS — BP 99/45 | HR 95 | Resp 16 | Ht 62.0 in | Wt 178.0 lb

## 2012-02-11 DIAGNOSIS — K219 Gastro-esophageal reflux disease without esophagitis: Secondary | ICD-10-CM | POA: Insufficient documentation

## 2012-02-11 DIAGNOSIS — M12819 Other specific arthropathies, not elsewhere classified, unspecified shoulder: Secondary | ICD-10-CM

## 2012-02-11 DIAGNOSIS — T148XXA Other injury of unspecified body region, initial encounter: Secondary | ICD-10-CM

## 2012-02-11 DIAGNOSIS — M069 Rheumatoid arthritis, unspecified: Secondary | ICD-10-CM | POA: Insufficient documentation

## 2012-02-11 DIAGNOSIS — R222 Localized swelling, mass and lump, trunk: Secondary | ICD-10-CM | POA: Insufficient documentation

## 2012-02-11 DIAGNOSIS — S81009A Unspecified open wound, unspecified knee, initial encounter: Secondary | ICD-10-CM | POA: Insufficient documentation

## 2012-02-11 DIAGNOSIS — M19019 Primary osteoarthritis, unspecified shoulder: Secondary | ICD-10-CM

## 2012-02-11 DIAGNOSIS — S91009A Unspecified open wound, unspecified ankle, initial encounter: Secondary | ICD-10-CM | POA: Insufficient documentation

## 2012-02-11 DIAGNOSIS — F17200 Nicotine dependence, unspecified, uncomplicated: Secondary | ICD-10-CM | POA: Insufficient documentation

## 2012-02-11 DIAGNOSIS — W19XXXA Unspecified fall, initial encounter: Secondary | ICD-10-CM | POA: Insufficient documentation

## 2012-02-11 DIAGNOSIS — M0500 Felty's syndrome, unspecified site: Secondary | ICD-10-CM | POA: Insufficient documentation

## 2012-02-11 DIAGNOSIS — IMO0001 Reserved for inherently not codable concepts without codable children: Secondary | ICD-10-CM | POA: Insufficient documentation

## 2012-02-11 MED ORDER — FENTANYL 50 MCG/HR TD PT72: 1.0000 | MEDICATED_PATCH | TRANSDERMAL | Status: DC

## 2012-02-11 MED ORDER — HYDROCODONE-ACETAMINOPHEN 7.5-325 MG PO TABS: 1.0000 | ORAL_TABLET | Freq: Four times a day (QID) | ORAL | Status: DC | PRN

## 2012-02-11 NOTE — Patient Instructions (Addendum)
Follow up with wound center, as soon as possible.

## 2012-02-11 NOTE — Progress Notes (Signed)
Subjective:    Patient ID: [name], female    DOB: [birth year], 61 y.o.   MRN: [id number]  HPI The patient is doing very well today. She states that the current medication regimen is helping her and she is not in a lot of pain. She states that she works out in [employer] regularly. She states, that she has stopped water aerobics, because she has an open wound on her left calf, after a fall one week ago. Otherwise the problem has been stable.   Pain Inventory Average Pain 6 Pain Right Now 8 My pain is stabbing and aching  In the last 24 hours, has pain interfered with the following? General activity 4 Relation with others 4 Enjoyment of life 3 What TIME of day is your pain at its worst? morning Sleep (in general) Fair  Pain is worse with: walking and standing Pain improves with: rest, medication and injections Relief from Meds: 6  Mobility walk without assistance how many minutes can you walk? 20 ability to climb steps?  yes do you drive?  yes  Function disabled: date disabled 2009 I need assistance with the following:  dressing, meal prep, household duties and shopping  Neuro/Psych trouble walking depression anxiety  Prior Studies Any changes since last visit?  no  Physicians involved in your care Any changes since last visit?  no   Family History  Problem Relation Age of Onset  . Clotting disorder Mother     Coumadin  . Allergies Mother   . Heart disease Mother   . Stroke Father   . Heart disease Father   . Alzheimer's disease Father    History   Social History  . Marital Status: Married    Spouse Name: N/A    Number of Children: 1  . Years of Education: N/A   Occupational History  . disability    Social History Main Topics  . Smoking status: Current Some Day Smoker -- 0.2 packs/day for 40 years    Types: Cigarettes    Last Attempt to Quit: 03/09/2009  . Smokeless tobacco: Never Used  . Alcohol Use: Yes     social  . Drug Use: No  .  Sexually Active: None   Other Topics Concern  . None   Social History Narrative  . None   Past Surgical History  Procedure Date  . Total abdominal hysterectomy   . Tubal ligation   . Appendectomy   . Total knee arthroplasty     left  . Rotator cuff repair   . Foot fracture surgery   . Synovectomy of l thumb   . Fusion of r wrist   . Upper palate surgery for tmj syndrome   . Replacement total knee     left   Past Medical History  Diagnosis Date  . Diverticulosis   . Fibromyalgia   . Anemia   . Rheumatoid arthritis   . Anxiety   . Blood transfusion   . GERD (gastroesophageal reflux disease)   . Myalgia   . Rheumatoid arthritis   . Depression   . Joint effusion   . Calcifying tendinitis of shoulder   . Myalgia and myositis    BP 99/45  Pulse 95  Resp 16  Ht 5\' 2"  (1.575 m)  Wt 178 lb (80.74 kg)  BMI 32.56 kg/m2  SpO2 96%      Review of Systems  Constitutional: Negative.   HENT: Negative.   Eyes: Negative.   Respiratory: Negative.  Cardiovascular: Negative.   Gastrointestinal: Negative.   Genitourinary: Negative.   Musculoskeletal: Positive for myalgias, arthralgias and gait problem.  Skin: Negative.   Neurological: Negative.   Hematological: Negative.   Psychiatric/Behavioral: Negative.        Objective:   Physical Exam Constitutional: She is oriented to person, place, and time. She appears well-developed and well-nourished.  HENT:  Head: Normocephalic.  Neck: Normal range of motion.  Musculoskeletal: She exhibits tenderness.  Neurological: She is alert and oriented to person, place, and time.  Skin: Skin is warm and dry.  Psychiatric: She has a normal mood and affect.  Symmetric normal motor tone is noted throughout. Normal muscle bulk, except atrophy on her hands bilateral. Muscle testing reveals 5/5 muscle strength of the upper extremity, except wrist and finger muscles, and 5/5 of the lower extremity. Full range of motion in upper and  lower extremities, except shoulders and wrist/ fingers bilateral. ROM of spine is restricted. Fine motor movements are restricted in both hands.  DTR in the upper and lower extremity are present and symmetric 2+. No clonus is noted.  Patient arises from chair without difficulty. Narrow based gait with normal arm swing bilateral . Open wound on left calf, quarter size, with redness around it, some pus in the center, warm and painful to touch.       Assessment & Plan:  This A 61 year old female with  1. Rheumatid arthritis  2. Fibromyalgia  3. Felty's syndrome  4. Mass in lung  5. Open wound on left calf, quarter size, some pus in the center, redness around, and warm and painful to touch, referred to wound care center. Plan :  Continue with medication. Continue with your exercise program.Patient has stopped water aerobics, because of the open wound, advised her to stay out of the pool until the wound has healed.  The patient is doing pretty well with the current medication regimen, refilled her medication today.  Follow up in 1 month.

## 2012-02-16 ENCOUNTER — Ambulatory Visit (INDEPENDENT_AMBULATORY_CARE_PROVIDER_SITE_OTHER): Payer: Medicare Other | Admitting: Emergency Medicine

## 2012-02-16 VITALS — BP 126/82 | HR 89 | Temp 98.2°F | Resp 16 | Ht 63.0 in | Wt 186.0 lb

## 2012-02-16 DIAGNOSIS — L98499 Non-pressure chronic ulcer of skin of other sites with unspecified severity: Secondary | ICD-10-CM

## 2012-02-16 DIAGNOSIS — IMO0002 Reserved for concepts with insufficient information to code with codable children: Secondary | ICD-10-CM

## 2012-02-16 MED ORDER — DOXYCYCLINE HYCLATE 100 MG PO CAPS: 100.0000 mg | ORAL_CAPSULE | Freq: Two times a day (BID) | ORAL | Status: AC

## 2012-02-16 NOTE — Progress Notes (Signed)
  Subjective:    Patient ID: [name], female    DOB: [birth year], 61 y.o.   MRN: [id number]  HPI patient was walking by last year today and sustained a laceration to her left leg. She has a history of severe wound infections following injuries. She has a history of MRSA. 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 On the front of the left leg is a 7 cm flap-like laceration is very superficial . The area was cleaned and Steri-Strips were applied 2 lesion on the front. Lesion on the back of the calf .           Assessment & Plan:  Wound leg with previous ulcer on the back of the leg. The lesion on the frontal leg was approximated with Steri-Strips. Culture was taken and the lesion in the back of the leg and referral made to the wound center

## 2012-02-17 ENCOUNTER — Other Ambulatory Visit: Payer: Self-pay | Admitting: Physical Medicine & Rehabilitation

## 2012-02-18 ENCOUNTER — Encounter: Payer: Self-pay | Admitting: Emergency Medicine

## 2012-02-18 ENCOUNTER — Ambulatory Visit (INDEPENDENT_AMBULATORY_CARE_PROVIDER_SITE_OTHER): Payer: Medicare Other | Admitting: Emergency Medicine

## 2012-02-18 VITALS — BP 115/73 | HR 108 | Temp 98.7°F | Resp 20 | Ht 63.0 in | Wt 185.0 lb

## 2012-02-18 DIAGNOSIS — S81009A Unspecified open wound, unspecified knee, initial encounter: Secondary | ICD-10-CM

## 2012-02-18 DIAGNOSIS — L03119 Cellulitis of unspecified part of limb: Secondary | ICD-10-CM

## 2012-02-18 DIAGNOSIS — S81809A Unspecified open wound, unspecified lower leg, initial encounter: Secondary | ICD-10-CM

## 2012-02-18 NOTE — Progress Notes (Signed)
  Subjective:    Patient ID: [name], female    DOB: 1950-07-22, 61 y.o.   MRN: [id number]  HPI patient here for recheck on wound left leg. She had a large flap-like laceration on the left shin. She also has an ulceration from her previous injury on the back of her left calf.    Review of Systems     Objective:   Physical Exam Steri-Strips are in place. There is no evidence of infection of anterior wound. There is decreased redness around the posterior laceration which is about 1.5 x 0.5 cm. Culture done is still pending        Assessment & Plan:  We'll continue antibiotics for now cultures pending we'll recheck on Monday is an appointment at the wound center on Wednesday .

## 2012-02-20 ENCOUNTER — Other Ambulatory Visit: Payer: Medicare Other | Admitting: Lab

## 2012-02-20 ENCOUNTER — Ambulatory Visit: Payer: Medicare Other | Admitting: Oncology

## 2012-02-21 LAB — WOUND CULTURE

## 2012-02-23 ENCOUNTER — Ambulatory Visit (INDEPENDENT_AMBULATORY_CARE_PROVIDER_SITE_OTHER): Payer: Medicare Other | Admitting: Emergency Medicine

## 2012-02-23 VITALS — BP 118/76 | HR 90 | Temp 98.3°F | Resp 18 | Ht 62.5 in | Wt 184.2 lb

## 2012-02-23 DIAGNOSIS — L0390 Cellulitis, unspecified: Secondary | ICD-10-CM

## 2012-02-23 DIAGNOSIS — S81009A Unspecified open wound, unspecified knee, initial encounter: Secondary | ICD-10-CM

## 2012-02-23 DIAGNOSIS — S81809A Unspecified open wound, unspecified lower leg, initial encounter: Secondary | ICD-10-CM

## 2012-02-23 NOTE — Progress Notes (Signed)
  Subjective:    Patient ID: [name], female    DOB: 1951/01/03, 61 y.o.   MRN: [id number]  HPI patient here to followup wound injury. She has an ulcer on the back of her calf as well as an avulsion laceration over the shin.    Review of Systems     Objective:   Physical Exam there is a 2 cm ulceration over the posterior calf. There is a healing avulsion laceration which has been Steri-Stripped over the shin. None good staph and strep both of which are sensitive to tetracycline        Assessment & Plan:  Patient doing well. Xeroform was reapplied. A dry dressing was applied. She will followup at the wound center on Wednesday. Given copies of her culture to take with her for her appointment.

## 2012-02-25 ENCOUNTER — Encounter (HOSPITAL_BASED_OUTPATIENT_CLINIC_OR_DEPARTMENT_OTHER): Payer: Medicare Other | Attending: General Surgery

## 2012-02-25 DIAGNOSIS — IMO0001 Reserved for inherently not codable concepts without codable children: Secondary | ICD-10-CM | POA: Insufficient documentation

## 2012-02-25 DIAGNOSIS — M069 Rheumatoid arthritis, unspecified: Secondary | ICD-10-CM | POA: Insufficient documentation

## 2012-02-25 DIAGNOSIS — E669 Obesity, unspecified: Secondary | ICD-10-CM | POA: Insufficient documentation

## 2012-02-25 DIAGNOSIS — K219 Gastro-esophageal reflux disease without esophagitis: Secondary | ICD-10-CM | POA: Insufficient documentation

## 2012-02-25 DIAGNOSIS — L97809 Non-pressure chronic ulcer of other part of unspecified lower leg with unspecified severity: Secondary | ICD-10-CM | POA: Insufficient documentation

## 2012-02-25 DIAGNOSIS — A4901 Methicillin susceptible Staphylococcus aureus infection, unspecified site: Secondary | ICD-10-CM | POA: Insufficient documentation

## 2012-02-25 NOTE — Progress Notes (Signed)
[facility]  NAME:  [name], [name]            ACCOUNT NO.:  [id number]  MEDICAL RECORD NO.:  [id number]      DATE OF BIRTH:  12-02-1950  PHYSICIAN:  Ardath Sax, M.D.           VISIT DATE:                                  OFFICE VISIT   This is a 61 year old obese female who came to [place] because of a 1 cm wound that she says was caused by trauma on her left calf.  This lady is quite obese and has many medical problems including myalgia, myositis, calcifying tendonitis, joint effusions, depression, rheumatoid arthritis, GERD, anxiety, fibromyalgia and diverticulosis.  She has a 1 cm wound that I cleaned up and treated it Endoform and I am sure this should heal without much of a problem.  It does not look infected.  She has got a good pulse and according to her doctor, her blood work is all normal.  Her white blood count is 7000, her hemoglobin is 10.5 and does say that she has a chronic low hemoglobin.  Doctor did culture this and it came out as Staph aureus, sensitive to about everything except penicillin.  So we will have her come back in a week.  Incidentally, her vital signs were normal.  Her blood pressure was even 106/70, her pulse was 90, temperature 98.6.     Ardath Sax, M.D.     PP/MEDQ  D:  02/25/2012  T:  02/25/2012  Job:  [id number]

## 2012-03-01 ENCOUNTER — Other Ambulatory Visit (HOSPITAL_BASED_OUTPATIENT_CLINIC_OR_DEPARTMENT_OTHER): Payer: Medicare Other | Admitting: Lab

## 2012-03-01 ENCOUNTER — Encounter: Payer: Self-pay | Admitting: Oncology

## 2012-03-01 ENCOUNTER — Telehealth: Payer: Self-pay | Admitting: Oncology

## 2012-03-01 ENCOUNTER — Ambulatory Visit (HOSPITAL_BASED_OUTPATIENT_CLINIC_OR_DEPARTMENT_OTHER): Payer: Medicare Other | Admitting: Oncology

## 2012-03-01 VITALS — BP 112/77 | HR 86 | Temp 98.3°F | Resp 20 | Ht 62.5 in | Wt 186.8 lb

## 2012-03-01 DIAGNOSIS — M0500 Felty's syndrome, unspecified site: Secondary | ICD-10-CM

## 2012-03-01 DIAGNOSIS — M359 Systemic involvement of connective tissue, unspecified: Secondary | ICD-10-CM

## 2012-03-01 DIAGNOSIS — D708 Other neutropenia: Secondary | ICD-10-CM

## 2012-03-01 DIAGNOSIS — D709 Neutropenia, unspecified: Secondary | ICD-10-CM

## 2012-03-01 LAB — LACTATE DEHYDROGENASE (CC13): LDH: 161 U/L (ref 125–220)

## 2012-03-01 LAB — COMPREHENSIVE METABOLIC PANEL (CC13)
BUN: 10 mg/dL (ref 7.0–26.0)
CO2: 28 mEq/L (ref 22–29)
Calcium: 9.5 mg/dL (ref 8.4–10.4)
Chloride: 100 mEq/L (ref 98–107)
Creatinine: 0.8 mg/dL (ref 0.6–1.1)
Glucose: 133 mg/dl — ABNORMAL HIGH (ref 70–99)
Total Bilirubin: 0.7 mg/dL (ref 0.20–1.20)

## 2012-03-01 LAB — CBC WITH DIFFERENTIAL/PLATELET
BASO%: 0.8 % (ref 0.0–2.0)
LYMPH%: 53.5 % — ABNORMAL HIGH (ref 14.0–49.7)
MCHC: 34 g/dL (ref 31.5–36.0)
MONO#: 0.3 10*3/uL (ref 0.1–0.9)
MONO%: 20.2 % — ABNORMAL HIGH (ref 0.0–14.0)
Platelets: 156 10*3/uL (ref 145–400)
RBC: 4.16 10*6/uL (ref 3.70–5.45)
RDW: 14.5 % (ref 11.2–14.5)
WBC: 1.7 10*3/uL — ABNORMAL LOW (ref 3.9–10.3)

## 2012-03-01 NOTE — Progress Notes (Signed)
CC:   [name], M.D. [name], M.D. [name]. Erika Dice, MD,FACG Erika Shan, MD Erika Deeds, MD [name], M.D.    PROBLEM LIST:  1. 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 Bone marrow was carried out on 06/19/2000 and  again on 02/16/2003. There were no abnormalities noted. The bone  marrows were negative.  2. History of rheumatoid arthritis with positive rheumatoid factor,  anti neutrophil antibodies with diagnosis going back to 1986. In  2012 the patient was taking prednisone 5 mg daily.  3. Fibromyalgia with date of onset 23.  4. History of GI bleeding in the fall of 2012 with negative endoscopy  and colonoscopy by Dr. Melvia Heaps on 04/22/2011. Stools on  05/15/2011 were negative x5. The patient denies any signs of GI  bleeding at the present time.  5. Right middle lobe consolidation noted in September 2012.  The patient's most recent CT scan of the chest without IV contrast was carried out on 10/15/2011 and showed improvement in the posterior right middle lobe consolidation felt to represent resolving atelectasis with underlying scarring from prior infections.  6. Osteopenia.  7. History of porphyria cutanea tarda diagnosed in 1992.  8. Left total knee arthroplasty by Dr. Ollen Knox on 06/19/2008.  9. Right apical lung nodule first noted on 01/13/2011 and stable as of CT scan from 10/15/2011. 10. Chronic pain, felt to be due to fibromyalgia.  The patient receives care at the pain clinic. 11. Abrasions of the left leg secondary to trauma in late August 2013 and early September 2013 treated with doxycycline to cover growth of staph and strep.  MEDICATIONS:  1. Calcium carbonate 600 mg twice a day.  2. Vitamin D 1000 units daily.  3. Copper gluconate 2 mg daily.  4. Flexeril 10 mg at bedtime.  5. Cymbalta 60 mg twice a day.  6. Duragesic patch 50 mcg/hour, change every 3 days.  7. Neupogen 480 mcg was  discontinued in mid March  2013.  8. Vicodin 7.5/325 twice a day as needed for pain, usually 3 daily.  9. Lyrica 200 mg twice a day.  10.Pamelor 100 mg 1 at bedtime.  11.Prilosec 20 mg twice a day.  12.Prednisone 10 mg daily.  13.Vitamin B12 1000 mcg daily.  14.Probiotic 1 capsule daily.   SMOKING HISTORY:  The patient is a former cigarette smoker.   HISTORY:  [name] is seen today for followup of her autoimmune neutropenia felt to be most likely due to Felty syndrome in association with rheumatoid arthritis.  [name] was last seen by [place] on 10/16/2011.  We have been checking monthly CBCs since that time.  It will be recalled that on 10/16/2011 the patient's ANC was 1.2. Unfortunately the ANC has fallen.  On 12/12/2011 the ANC was 0.9. However, on 01/15/2012 the ANC was 0.4 and today it is also 0.4. Fortunately the patient has not had any fever, chills, night sweats. She did have abrasions of her left lower leg from trauma in late August and early September.  She is being seen at the wound center.  Cultures did grow out staph and strep and she was treated with doxycycline 100 mg twice a day for 1 week.  Her abrasions seem to be improving.  The patient is really without any other complaints today, feels generally well.  She has not been on Neupogen now for about 6-1/2 months.  She continues to take prednisone 10 mg daily.  PHYSICAL EXAM:  The patient looks well.  There are no obvious changes in her condition or appearance.  Weight is 186.8 pounds, height 5 feet 2- 1/2 inches, body surface area 1.93 m2.  Blood pressure 112/77, temperature 98.3.  O2 saturation on room air at rest was 97%.  I should mention the patient denies any respiratory symptoms.  No scleral icterus.  Mouth and pharynx are benign.  No peripheral adenopathy palpable.  Heart and lungs are normal.  The patient does not have a Port- A-Cath or central catheter.  Abdomen is somewhat obese, nontender  with no organomegaly or masses palpable.  Extremities:  Left lower leg is wrapped with an Ace bandage.  There is no obvious swelling or inflammation involving the left ankle and foot.  She has some scarring of her right lower leg secondary to trauma.  She has purpura over her arms.  Neurologic exam is grossly normal.  The patient has some cushingoid features involving her face.  LABORATORY DATA:  Today, white count 1.7, ANC 0.4, hemoglobin 12.8, hematocrit 37.6, platelets 156,000.  There are 25% neutrophils, 54% lymphs.  Chemistries were normal except for a glucose of 133.  Albumin 3.5, LDH 161, BUN 10.0, creatinine 0.8.   IMAGING STUDIES:  1. CT scan of the chest without IV contrast on 05/16/2011  showed slight improvement in the right middle lobe consolidation  containing air bronchograms. No endobronchial lesion was identified.  It was suggested that continued radiographic follow up be carried out  to ensure resolution. I reviewed this CT scan as well as the previous  scan of 02/28/2011 with the radiologist.  2. Screening bilateral mammogram from 07/22/2011 was negative.  3. CT scan of the chest without IV contrast from 10/15/2011 showed  further improvement in the posterior right middle lobe  consolidation with air bronchograms. This is felt to represent  resolving atelectasis with underlying scarring from prior  infections. No endobronchial lesion was seen. There is also a 4  mm right apical lung nodule that apparently is unchanged from  02/24/2011 and also 01/13/2011.  We are planning to check another CT scan of the chest in May of 2014.    IMPRESSION AND PLAN:  Clinically [name] seems to be doing well. She has been off the Neupogen now 6-1/2 months.  She continues on prednisone 10 mg daily.  Fortunately she has had no febrile episodes nor has she had any apparent pulmonary infections.  She does go to the pain clinic on a monthly basis for renewal of her Duragesic and  hydrocodone. In view of some trauma to her left lower leg she is now going to the wound center.  As stated above she received doxycycline for 1 week. Apparently the patient did not have cellulitis.  The wound did grow out staph and strep.  Despite the recurrence of the neutropenia the patient seems to be getting along fairly well.  Our plan will be to continue checking CBCs every month and to see the patient again in 4 months, which will be in mid to late January 2014.  We also plan to obtain another CT scan of the chest without IV contrast in May of 2014 which will be a year from the prior CT scan.    ______________________________ Samul Dada, M.D. DSM/MEDQ  D:  03/01/2012  T:  03/01/2012  Job:  [id number]

## 2012-03-01 NOTE — Progress Notes (Signed)
This office note has been dictated.  #[id number]

## 2012-03-01 NOTE — Telephone Encounter (Signed)
s/w pt and made her aware that her appts have been mailed to her

## 2012-03-10 ENCOUNTER — Encounter: Payer: Self-pay | Admitting: Physical Medicine and Rehabilitation

## 2012-03-10 ENCOUNTER — Encounter (HOSPITAL_BASED_OUTPATIENT_CLINIC_OR_DEPARTMENT_OTHER): Payer: Medicare Other | Attending: General Surgery

## 2012-03-10 ENCOUNTER — Encounter
Payer: Medicare Other | Attending: Physical Medicine and Rehabilitation | Admitting: Physical Medicine and Rehabilitation

## 2012-03-10 VITALS — BP 119/54 | HR 83 | Resp 14 | Ht 62.5 in | Wt 186.6 lb

## 2012-03-10 DIAGNOSIS — S81809A Unspecified open wound, unspecified lower leg, initial encounter: Secondary | ICD-10-CM | POA: Insufficient documentation

## 2012-03-10 DIAGNOSIS — M069 Rheumatoid arthritis, unspecified: Secondary | ICD-10-CM | POA: Insufficient documentation

## 2012-03-10 DIAGNOSIS — M19019 Primary osteoarthritis, unspecified shoulder: Secondary | ICD-10-CM

## 2012-03-10 DIAGNOSIS — IMO0001 Reserved for inherently not codable concepts without codable children: Secondary | ICD-10-CM | POA: Insufficient documentation

## 2012-03-10 DIAGNOSIS — X58XXXA Exposure to other specified factors, initial encounter: Secondary | ICD-10-CM | POA: Insufficient documentation

## 2012-03-10 DIAGNOSIS — M12819 Other specific arthropathies, not elsewhere classified, unspecified shoulder: Secondary | ICD-10-CM

## 2012-03-10 DIAGNOSIS — L97209 Non-pressure chronic ulcer of unspecified calf with unspecified severity: Secondary | ICD-10-CM | POA: Insufficient documentation

## 2012-03-10 DIAGNOSIS — R222 Localized swelling, mass and lump, trunk: Secondary | ICD-10-CM | POA: Insufficient documentation

## 2012-03-10 DIAGNOSIS — S81009A Unspecified open wound, unspecified knee, initial encounter: Secondary | ICD-10-CM | POA: Insufficient documentation

## 2012-03-10 MED ORDER — FENTANYL 50 MCG/HR TD PT72: 1.0000 | MEDICATED_PATCH | TRANSDERMAL | Status: DC

## 2012-03-10 MED ORDER — HYDROCODONE-ACETAMINOPHEN 7.5-325 MG PO TABS: 1.0000 | ORAL_TABLET | Freq: Four times a day (QID) | ORAL | Status: DC | PRN

## 2012-03-10 NOTE — Progress Notes (Signed)
Subjective:    Patient ID: [name], female    DOB: 1951-03-02, 61 y.o.   MRN: [id number]  HPI The patient is doing very well today. She states that the current medication regimen is helping her and she is not in a lot of pain. She states that she works out in [employer] regularly. She states, that she has stopped water aerobics, because she has an open wound on her left calf, she is treated by wound care center, the wound is healing well. She has finished a course of Antibiotics over 10 days. Otherwise the problem has been stable.    Pain Inventory Average Pain 5 Pain Right Now 5 My pain is stabbing  In the last 24 hours, has pain interfered with the following? General activity 4 Relation with others 2 Enjoyment of life 2 What TIME of day is your pain at its worst? morning Sleep (in general) Good  Pain is worse with: walking and some activites Pain improves with: medication and injections Relief from Meds: 7  Mobility walk without assistance how many minutes can you walk? 20 ability to climb steps?  yes do you drive?  yes  Function disabled: date disabled 2009 I need assistance with the following:  meal prep, household duties and shopping  Neuro/Psych depression anxiety  Prior Studies Any changes since last visit?  no  Physicians involved in your care Any changes since last visit?  no   Family History  Problem Relation Age of Onset  . Clotting disorder Mother     Coumadin  . Allergies Mother   . Heart disease Mother   . Stroke Father   . Heart disease Father   . Alzheimer's disease Father    History   Social History  . Marital Status: Married    Spouse Name: N/A    Number of Children: 1  . Years of Education: N/A   Occupational History  . disability    Social History Main Topics  . Smoking status: Former Smoker -- 0.2 packs/day for 40 years    Types: Cigarettes    Quit date: 12/16/2011  . Smokeless tobacco: Never Used  . Alcohol Use: Yes       social  . Drug Use: No  . Sexually Active: None   Other Topics Concern  . None   Social History Narrative  . None   Past Surgical History  Procedure Date  . Total abdominal hysterectomy   . Tubal ligation   . Appendectomy   . Total knee arthroplasty     left  . Rotator cuff repair   . Foot fracture surgery   . Synovectomy of l thumb   . Fusion of r wrist   . Upper palate surgery for tmj syndrome   . Replacement total knee     left   Past Medical History  Diagnosis Date  . Diverticulosis   . Fibromyalgia   . Anemia   . Rheumatoid arthritis   . Anxiety   . Blood transfusion   . GERD (gastroesophageal reflux disease)   . Myalgia   . Rheumatoid arthritis   . Depression   . Joint effusion   . Calcifying tendinitis of shoulder   . Myalgia and myositis    BP 119/54  Pulse 83  Resp 14  Ht 5' 2.5" (1.588 m)  Wt 186 lb 9.6 oz (84.641 kg)  BMI 33.59 kg/m2  SpO2 93%   Review of Systems  Psychiatric/Behavioral: Positive for dysphoric mood. The  patient is nervous/anxious.   All other systems reviewed and are negative.       Objective:   Physical Exam Constitutional: She is oriented to person, place, and time. She appears well-developed and well-nourished.  HENT:  Head: Normocephalic.  Neck: Normal range of motion.  Musculoskeletal: She exhibits tenderness.  Neurological: She is alert and oriented to person, place, and time.  Skin: Skin is warm and dry.  Psychiatric: She has a normal mood and affect.  Symmetric normal motor tone is noted throughout. Normal muscle bulk, except atrophy on her hands bilateral. Muscle testing reveals 5/5 muscle strength of the upper extremity, except wrist and finger muscles, and 5/5 of the lower extremity. Full range of motion in upper and lower extremities, except shoulders and wrist/ fingers bilateral. ROM of spine is restricted. Fine motor movements are restricted in both hands.  DTR in the upper and lower extremity are  present and symmetric 2+. No clonus is noted.  Patient arises from chair without difficulty. Narrow based gait with normal arm swing bilateral .  Open wound on left calf, healing well no signs of infection.        Assessment & Plan:  This A 61 year old female with  1. Rheumatid arthritis  2. Fibromyalgia  3. Felty's syndrome  4. Mass in lung  5. Open wound on left calf,is healing,treated by wound care center, they applied euroform dressings which helped a lot, she also was on an Antibiotic for 10 days, no redness, heat noted today..  Plan :  Continue with medication. Continue with your exercise program.Patient has stopped water aerobics, because of the open wound, advised her to stay out of the pool until the wound has healed. The patient is doing pretty well with the current medication regimen, refilled her medication today.  Follow up in 1 month.

## 2012-03-10 NOTE — Patient Instructions (Signed)
Continue with exercising and walking, start with aquatic exercising, when your open wounds are healed.

## 2012-03-12 ENCOUNTER — Ambulatory Visit: Payer: Medicare Other | Admitting: Physical Medicine and Rehabilitation

## 2012-03-24 ENCOUNTER — Encounter (HOSPITAL_BASED_OUTPATIENT_CLINIC_OR_DEPARTMENT_OTHER): Payer: Medicare Other

## 2012-03-29 ENCOUNTER — Other Ambulatory Visit (HOSPITAL_BASED_OUTPATIENT_CLINIC_OR_DEPARTMENT_OTHER): Payer: Medicare Other | Admitting: Lab

## 2012-03-29 DIAGNOSIS — D704 Cyclic neutropenia: Secondary | ICD-10-CM

## 2012-03-29 DIAGNOSIS — M0500 Felty's syndrome, unspecified site: Secondary | ICD-10-CM

## 2012-03-29 LAB — CBC WITH DIFFERENTIAL/PLATELET
Basophils Absolute: 0 10*3/uL (ref 0.0–0.1)
Eosinophils Absolute: 0 10*3/uL (ref 0.0–0.5)
HGB: 12.8 g/dL (ref 11.6–15.9)
MCV: 89.1 fL (ref 79.5–101.0)
MONO%: 30.3 % — ABNORMAL HIGH (ref 0.0–14.0)
NEUT#: 0.5 10*3/uL — CL (ref 1.5–6.5)
Platelets: 169 10*3/uL (ref 145–400)
RDW: 14.8 % — ABNORMAL HIGH (ref 11.2–14.5)

## 2012-04-06 ENCOUNTER — Encounter
Payer: Medicare Other | Attending: Physical Medicine and Rehabilitation | Admitting: Physical Medicine and Rehabilitation

## 2012-04-06 ENCOUNTER — Encounter: Payer: Self-pay | Admitting: Physical Medicine and Rehabilitation

## 2012-04-06 VITALS — BP 110/71 | HR 99 | Resp 14 | Ht 62.0 in | Wt 188.0 lb

## 2012-04-06 DIAGNOSIS — R222 Localized swelling, mass and lump, trunk: Secondary | ICD-10-CM | POA: Insufficient documentation

## 2012-04-06 DIAGNOSIS — S81009A Unspecified open wound, unspecified knee, initial encounter: Secondary | ICD-10-CM | POA: Insufficient documentation

## 2012-04-06 DIAGNOSIS — M069 Rheumatoid arthritis, unspecified: Secondary | ICD-10-CM | POA: Insufficient documentation

## 2012-04-06 DIAGNOSIS — X58XXXA Exposure to other specified factors, initial encounter: Secondary | ICD-10-CM | POA: Insufficient documentation

## 2012-04-06 DIAGNOSIS — M19019 Primary osteoarthritis, unspecified shoulder: Secondary | ICD-10-CM

## 2012-04-06 DIAGNOSIS — M0500 Felty's syndrome, unspecified site: Secondary | ICD-10-CM | POA: Insufficient documentation

## 2012-04-06 DIAGNOSIS — M12819 Other specific arthropathies, not elsewhere classified, unspecified shoulder: Secondary | ICD-10-CM

## 2012-04-06 DIAGNOSIS — IMO0001 Reserved for inherently not codable concepts without codable children: Secondary | ICD-10-CM | POA: Insufficient documentation

## 2012-04-06 MED ORDER — HYDROCODONE-ACETAMINOPHEN 7.5-325 MG PO TABS: 1.0000 | ORAL_TABLET | Freq: Four times a day (QID) | ORAL | Status: DC | PRN

## 2012-04-06 MED ORDER — FENTANYL 50 MCG/HR TD PT72: 1.0000 | MEDICATED_PATCH | TRANSDERMAL | Status: DC

## 2012-04-06 NOTE — Patient Instructions (Signed)
Continue with your exercise program, try to find time for yourself to relax.

## 2012-04-06 NOTE — Progress Notes (Signed)
Subjective:    Patient ID: [name], female    DOB: 1951/05/10, 61 y.o.   MRN: [id number]  HPI The patient is doing very well today. She states that the current medication regimen is helping her and she is not in a lot of pain. She states that she works out in [employer] regularly. She states, that she has stopped water aerobics, because she still has an open wound on her left calf, after a fall, s/p infection in August. Otherwise the problem has been stable. The patient also complains being a little more exhausted, because she has to take care of her husband who has been diagnosed with Alzheimers.  Pain Inventory Average Pain 6 Pain Right Now 6 My pain is aching  In the last 24 hours, has pain interfered with the following? General activity 5 Relation with others 4 Enjoyment of life 7 What TIME of day is your pain at its worst? morning Sleep (in general) Good  Pain is worse with: walking and some activites Pain improves with: medication Relief from Meds: 5  Mobility walk without assistance how many minutes can you walk? 15 ability to climb steps?  yes do you drive?  yes transfers alone Do you have any goals in this area?  no  Function disabled: date disabled 08/09 I need assistance with the following:  dressing, meal prep, household duties and shopping Do you have any goals in this area?  no  Neuro/Psych depression anxiety  Prior Studies Any changes since last visit?  no  Physicians involved in your care Any changes since last visit?  no   Family History  Problem Relation Age of Onset  . Clotting disorder Mother     Coumadin  . Allergies Mother   . Heart disease Mother   . Stroke Father   . Heart disease Father   . Alzheimer's disease Father    History   Social History  . Marital Status: Married    Spouse Name: N/A    Number of Children: 1  . Years of Education: N/A   Occupational History  . disability    Social History Main Topics  .  Smoking status: Former Smoker -- 0.2 packs/day for 40 years    Types: Cigarettes    Quit date: 12/16/2011  . Smokeless tobacco: Never Used  . Alcohol Use: Yes     social  . Drug Use: No  . Sexually Active: None   Other Topics Concern  . None   Social History Narrative  . None   Past Surgical History  Procedure Date  . Total abdominal hysterectomy   . Tubal ligation   . Appendectomy   . Total knee arthroplasty     left  . Rotator cuff repair   . Foot fracture surgery   . Synovectomy of l thumb   . Fusion of r wrist   . Upper palate surgery for tmj syndrome   . Replacement total knee     left   Past Medical History  Diagnosis Date  . Diverticulosis   . Fibromyalgia   . Anemia   . Rheumatoid arthritis   . Anxiety   . Blood transfusion   . GERD (gastroesophageal reflux disease)   . Myalgia   . Rheumatoid arthritis   . Depression   . Joint effusion   . Calcifying tendinitis of shoulder   . Myalgia and myositis    BP 110/71  Pulse 99  Resp 14  Ht 5\' 2"  (1.575  m)  Wt 188 lb (85.276 kg)  BMI 34.39 kg/m2  SpO2 97%     Review of Systems  Musculoskeletal: Positive for myalgias and arthralgias.  Psychiatric/Behavioral: Positive for dysphoric mood. The patient is nervous/anxious.   All other systems reviewed and are negative.       Objective:   Physical Exam Constitutional: She is oriented to person, place, and time. She appears well-developed and well-nourished.  HENT:  Head: Normocephalic.  Neck: Normal range of motion.  Musculoskeletal: She exhibits tenderness.  Neurological: She is alert and oriented to person, place, and time.  Skin: Skin is warm and dry.  Psychiatric: She has a normal mood and affect.  Symmetric normal motor tone is noted throughout. Normal muscle bulk, except atrophy on her hands bilateral. Muscle testing reveals 5/5 muscle strength of the upper extremity, except wrist and finger muscles, and 5/5 of the lower extremity. Full range  of motion in upper and lower extremities, except shoulders and wrist/ fingers bilateral. ROM of spine is restricted. Fine motor movements are restricted in both hands.  DTR in the upper and lower extremity are present and symmetric 2+. No clonus is noted.  Patient arises from chair without difficulty. Narrow based gait with normal arm swing bilateral .  Open wound on left calf, healing well no signs of infection.        Assessment & Plan:  This A 61 year old female with  1. Rheumatid arthritis  2. Fibromyalgia  3. Felty's syndrome  4. Mass in lung  5. Open wound on left calf,is healing,treated by wound care center, they applied euroform dressings which helped a lot, she also was on an Antibiotic for 10 days, no redness, heat noted today..  Plan :  Continue with medication. Continue with your exercise program.Patient has stopped water aerobics, because of the open wound, advised her to stay out of the pool until the wound has healed. Advised patient to organize some time for herself to relax and get strength to be able to care for her husband. The patient is doing pretty well with the current medication regimen, refilled her medication today.  Follow up in 1 month.

## 2012-04-14 ENCOUNTER — Encounter (HOSPITAL_BASED_OUTPATIENT_CLINIC_OR_DEPARTMENT_OTHER): Payer: Medicare Other | Attending: General Surgery

## 2012-04-14 DIAGNOSIS — S81809A Unspecified open wound, unspecified lower leg, initial encounter: Secondary | ICD-10-CM | POA: Insufficient documentation

## 2012-04-14 DIAGNOSIS — S81009A Unspecified open wound, unspecified knee, initial encounter: Secondary | ICD-10-CM | POA: Insufficient documentation

## 2012-04-14 DIAGNOSIS — X58XXXA Exposure to other specified factors, initial encounter: Secondary | ICD-10-CM | POA: Insufficient documentation

## 2012-04-26 ENCOUNTER — Other Ambulatory Visit: Payer: Medicare Other | Admitting: Lab

## 2012-04-26 DIAGNOSIS — D704 Cyclic neutropenia: Secondary | ICD-10-CM

## 2012-04-26 DIAGNOSIS — M0500 Felty's syndrome, unspecified site: Secondary | ICD-10-CM

## 2012-04-26 DIAGNOSIS — D708 Other neutropenia: Secondary | ICD-10-CM

## 2012-04-26 LAB — CBC WITH DIFFERENTIAL/PLATELET
Basophils Absolute: 0 10*3/uL (ref 0.0–0.1)
EOS%: 0.1 % (ref 0.0–7.0)
Eosinophils Absolute: 0 10*3/uL (ref 0.0–0.5)
HGB: 12.8 g/dL (ref 11.6–15.9)
NEUT#: 0.5 10*3/uL — CL (ref 1.5–6.5)
RDW: 14.5 % (ref 11.2–14.5)
WBC: 1.8 10*3/uL — ABNORMAL LOW (ref 3.9–10.3)
lymph#: 0.9 10*3/uL (ref 0.9–3.3)

## 2012-05-04 ENCOUNTER — Encounter: Payer: Self-pay | Admitting: Physical Medicine and Rehabilitation

## 2012-05-04 ENCOUNTER — Encounter
Payer: Medicare Other | Attending: Physical Medicine and Rehabilitation | Admitting: Physical Medicine and Rehabilitation

## 2012-05-04 VITALS — BP 146/89 | HR 109 | Resp 16 | Ht 62.5 in | Wt 188.0 lb

## 2012-05-04 DIAGNOSIS — M19019 Primary osteoarthritis, unspecified shoulder: Secondary | ICD-10-CM

## 2012-05-04 DIAGNOSIS — IMO0001 Reserved for inherently not codable concepts without codable children: Secondary | ICD-10-CM | POA: Insufficient documentation

## 2012-05-04 DIAGNOSIS — S91009A Unspecified open wound, unspecified ankle, initial encounter: Secondary | ICD-10-CM | POA: Insufficient documentation

## 2012-05-04 DIAGNOSIS — R222 Localized swelling, mass and lump, trunk: Secondary | ICD-10-CM | POA: Insufficient documentation

## 2012-05-04 DIAGNOSIS — Z5181 Encounter for therapeutic drug level monitoring: Secondary | ICD-10-CM

## 2012-05-04 DIAGNOSIS — S81009A Unspecified open wound, unspecified knee, initial encounter: Secondary | ICD-10-CM | POA: Insufficient documentation

## 2012-05-04 DIAGNOSIS — X58XXXA Exposure to other specified factors, initial encounter: Secondary | ICD-10-CM | POA: Insufficient documentation

## 2012-05-04 DIAGNOSIS — M12819 Other specific arthropathies, not elsewhere classified, unspecified shoulder: Secondary | ICD-10-CM

## 2012-05-04 DIAGNOSIS — M0500 Felty's syndrome, unspecified site: Secondary | ICD-10-CM | POA: Insufficient documentation

## 2012-05-04 DIAGNOSIS — M069 Rheumatoid arthritis, unspecified: Secondary | ICD-10-CM | POA: Insufficient documentation

## 2012-05-04 MED ORDER — HYDROCODONE-ACETAMINOPHEN 7.5-325 MG PO TABS: 1.0000 | ORAL_TABLET | Freq: Four times a day (QID) | ORAL | Status: DC | PRN

## 2012-05-04 MED ORDER — FENTANYL 50 MCG/HR TD PT72: 1.0000 | MEDICATED_PATCH | TRANSDERMAL | Status: DC

## 2012-05-04 NOTE — Patient Instructions (Signed)
Continue with following up with the wound center, stay as active as possible.

## 2012-05-04 NOTE — Progress Notes (Signed)
Subjective:    Patient ID: [name], female    DOB: [birth year], 61 y.o.   MRN: [id number]  HPI The patient is doing very well today. She states that the current medication regimen is helping her and she is not in a lot of pain. She states that she works out in [employer] regularly. She states, that she has stopped water aerobics, because she still has an open wound on her left calf, after a fall, s/p infection in August.Unfortunately the infection has returned, and she is back at the wound center again for treatment. Otherwise the problem has been stable. The patient also complains being more exhausted, because she has to take care of her husband who has been diagnosed with Alzheimers.  Pain Inventory Average Pain 6 Pain Right Now 7 My pain is aching  In the last 24 hours, has pain interfered with the following? General activity 7 Relation with others 6 Enjoyment of life 7 What TIME of day is your pain at its worst? morning Sleep (in general) Good  Pain is worse with: walking Pain improves with: medication Relief from Meds: 5  Mobility walk without assistance how many minutes can you walk? 5 ability to climb steps?  yes do you drive?  yes transfers alone Do you have any goals in this area?  no  Function disabled: date disabled 01/26/08 I need assistance with the following:  dressing, meal prep, household duties and shopping  Neuro/Psych depression anxiety  Prior Studies Any changes since last visit?  no  Physicians involved in your care Any changes since last visit?  no   Family History  Problem Relation Age of Onset  . Clotting disorder Mother     Coumadin  . Allergies Mother   . Heart disease Mother   . Stroke Father   . Heart disease Father   . Alzheimer's disease Father    History   Social History  . Marital Status: Married    Spouse Name: N/A    Number of Children: 1  . Years of Education: N/A   Occupational History  . disability    Social  History Main Topics  . Smoking status: Former Smoker -- 0.2 packs/day for 40 years    Types: Cigarettes    Quit date: 12/16/2011  . Smokeless tobacco: Never Used  . Alcohol Use: Yes     Comment: social  . Drug Use: No  . Sexually Active: None   Other Topics Concern  . None   Social History Narrative  . None   Past Surgical History  Procedure Date  . Total abdominal hysterectomy   . Tubal ligation   . Appendectomy   . Total knee arthroplasty     left  . Rotator cuff repair   . Foot fracture surgery   . Synovectomy of l thumb   . Fusion of r wrist   . Upper palate surgery for tmj syndrome   . Replacement total knee     left   Past Medical History  Diagnosis Date  . Diverticulosis   . Fibromyalgia   . Anemia   . Rheumatoid arthritis   . Anxiety   . Blood transfusion   . GERD (gastroesophageal reflux disease)   . Myalgia   . Rheumatoid arthritis   . Depression   . Joint effusion   . Calcifying tendinitis of shoulder   . Myalgia and myositis    BP 146/89  Pulse 109  Resp 16  Ht 5' 2.5" (  1.588 m)  Wt 188 lb (85.276 kg)  BMI 33.84 kg/m2  SpO2 94%    Review of Systems  Musculoskeletal: Positive for myalgias and arthralgias.  Psychiatric/Behavioral: Positive for dysphoric mood. The patient is nervous/anxious.   All other systems reviewed and are negative.       Objective:   Physical Exam Constitutional: She is oriented to person, place, and time. She appears well-developed and well-nourished.  HENT:  Head: Normocephalic.  Neck: Normal range of motion.  Musculoskeletal: She exhibits tenderness.  Neurological: She is alert and oriented to person, place, and time.  Skin: Skin is warm and dry.  Psychiatric: She has a normal mood and affect.  Symmetric normal motor tone is noted throughout. Normal muscle bulk, except atrophy on her hands bilateral. Muscle testing reveals 5/5 muscle strength of the upper extremity, except wrist and finger muscles, and 5/5  of the lower extremity. Full range of motion in upper and lower extremities, except shoulders and wrist/ fingers bilateral. ROM of spine is restricted. Fine motor movements are restricted in both hands.  DTR in the upper and lower extremity are present and symmetric 2+. No clonus is noted.  Patient arises from chair without difficulty. Narrow based gait with normal arm swing bilateral .  Open wound on left calf, infected, follows up with wound care center.        Assessment & Plan:  This A 61 year old female with  1. Rheumatid arthritis  2. Fibromyalgia  3. Felty's syndrome  4. Mass in lung  5. Open wound on left calf,reinfected,treated by wound care center.  Plan :  Continue with medication. Continue with your exercise program.Patient has stopped water aerobics, because of the open wound, advised her to stay out of the pool until the wound has healed. Advised patient to organize some time for herself to relax and get strength to be able to care for her husband. The patient is doing pretty well with the current medication regimen, refilled her medication today.  Follow up in 1 month.

## 2012-05-12 ENCOUNTER — Encounter (HOSPITAL_BASED_OUTPATIENT_CLINIC_OR_DEPARTMENT_OTHER): Payer: Medicare Other | Attending: General Surgery

## 2012-05-12 ENCOUNTER — Encounter (HOSPITAL_BASED_OUTPATIENT_CLINIC_OR_DEPARTMENT_OTHER): Payer: Medicare Other

## 2012-05-12 DIAGNOSIS — I872 Venous insufficiency (chronic) (peripheral): Secondary | ICD-10-CM | POA: Insufficient documentation

## 2012-05-12 DIAGNOSIS — X58XXXA Exposure to other specified factors, initial encounter: Secondary | ICD-10-CM | POA: Insufficient documentation

## 2012-05-12 DIAGNOSIS — S81009A Unspecified open wound, unspecified knee, initial encounter: Secondary | ICD-10-CM | POA: Insufficient documentation

## 2012-05-12 DIAGNOSIS — L97809 Non-pressure chronic ulcer of other part of unspecified lower leg with unspecified severity: Secondary | ICD-10-CM | POA: Insufficient documentation

## 2012-05-17 ENCOUNTER — Telehealth: Payer: Self-pay

## 2012-05-17 NOTE — Telephone Encounter (Signed)
Message copied by Judd Gaudier on Mon May 17, 2012  8:29 AM ------      Message from: Su Monks      Created: Fri May 14, 2012  9:47 AM       Please ask patient, whether she has taken any sleeping pill, or whether she has any explanation for the temazepam (restoril)

## 2012-05-17 NOTE — Telephone Encounter (Signed)
Left message for patient to call office regarding her inconsistent urine drug screen and see why she had restoril in results.

## 2012-05-18 NOTE — Telephone Encounter (Signed)
Patient says she take melatonin and flexeril.  She denies taking anyone's medication and does not have a script for restoril.

## 2012-05-18 NOTE — Telephone Encounter (Signed)
Will talk to her next visit

## 2012-05-19 ENCOUNTER — Other Ambulatory Visit: Payer: Self-pay | Admitting: Physical Medicine & Rehabilitation

## 2012-05-24 ENCOUNTER — Other Ambulatory Visit (HOSPITAL_BASED_OUTPATIENT_CLINIC_OR_DEPARTMENT_OTHER): Payer: Medicare Other | Admitting: Lab

## 2012-05-24 DIAGNOSIS — D708 Other neutropenia: Secondary | ICD-10-CM

## 2012-05-24 DIAGNOSIS — M0500 Felty's syndrome, unspecified site: Secondary | ICD-10-CM

## 2012-05-24 DIAGNOSIS — M359 Systemic involvement of connective tissue, unspecified: Secondary | ICD-10-CM

## 2012-05-24 LAB — CBC WITH DIFFERENTIAL/PLATELET
Basophils Absolute: 0 10*3/uL (ref 0.0–0.1)
Eosinophils Absolute: 0 10*3/uL (ref 0.0–0.5)
HCT: 37.4 % (ref 34.8–46.6)
LYMPH%: 61.4 % — ABNORMAL HIGH (ref 14.0–49.7)
MCV: 87.4 fL (ref 79.5–101.0)
MONO%: 23.3 % — ABNORMAL HIGH (ref 0.0–14.0)
NEUT#: 0.3 10*3/uL — CL (ref 1.5–6.5)
NEUT%: 14.3 % — ABNORMAL LOW (ref 38.4–76.8)
Platelets: 147 10*3/uL (ref 145–400)
RBC: 4.28 10*6/uL (ref 3.70–5.45)

## 2012-05-26 ENCOUNTER — Other Ambulatory Visit: Payer: Self-pay | Admitting: Physical Medicine & Rehabilitation

## 2012-05-27 ENCOUNTER — Other Ambulatory Visit: Payer: Self-pay | Admitting: *Deleted

## 2012-05-27 MED ORDER — NORTRIPTYLINE HCL 50 MG PO CAPS: ORAL_CAPSULE | ORAL | Status: DC

## 2012-06-01 ENCOUNTER — Telehealth: Payer: Self-pay | Admitting: Oncology

## 2012-06-01 NOTE — Telephone Encounter (Signed)
Called pt and she is aware of her appt change on 06/21/12

## 2012-06-07 ENCOUNTER — Encounter
Payer: Medicare Other | Attending: Physical Medicine and Rehabilitation | Admitting: Physical Medicine and Rehabilitation

## 2012-06-07 ENCOUNTER — Encounter: Payer: Self-pay | Admitting: Physical Medicine and Rehabilitation

## 2012-06-07 VITALS — BP 137/70 | HR 84 | Resp 14 | Ht 62.0 in | Wt 187.4 lb

## 2012-06-07 DIAGNOSIS — S91009A Unspecified open wound, unspecified ankle, initial encounter: Secondary | ICD-10-CM | POA: Insufficient documentation

## 2012-06-07 DIAGNOSIS — IMO0001 Reserved for inherently not codable concepts without codable children: Secondary | ICD-10-CM | POA: Insufficient documentation

## 2012-06-07 DIAGNOSIS — R222 Localized swelling, mass and lump, trunk: Secondary | ICD-10-CM | POA: Insufficient documentation

## 2012-06-07 DIAGNOSIS — M069 Rheumatoid arthritis, unspecified: Secondary | ICD-10-CM | POA: Insufficient documentation

## 2012-06-07 DIAGNOSIS — M0500 Felty's syndrome, unspecified site: Secondary | ICD-10-CM | POA: Insufficient documentation

## 2012-06-07 DIAGNOSIS — S81009A Unspecified open wound, unspecified knee, initial encounter: Secondary | ICD-10-CM | POA: Insufficient documentation

## 2012-06-07 DIAGNOSIS — M12819 Other specific arthropathies, not elsewhere classified, unspecified shoulder: Secondary | ICD-10-CM

## 2012-06-07 DIAGNOSIS — W19XXXA Unspecified fall, initial encounter: Secondary | ICD-10-CM | POA: Insufficient documentation

## 2012-06-07 DIAGNOSIS — M19019 Primary osteoarthritis, unspecified shoulder: Secondary | ICD-10-CM

## 2012-06-07 MED ORDER — HYDROCODONE-ACETAMINOPHEN 7.5-325 MG PO TABS: 1.0000 | ORAL_TABLET | Freq: Four times a day (QID) | ORAL | Status: DC | PRN

## 2012-06-07 MED ORDER — FENTANYL 50 MCG/HR TD PT72: 1.0000 | MEDICATED_PATCH | TRANSDERMAL | Status: DC

## 2012-06-07 NOTE — Progress Notes (Signed)
Subjective:    Patient ID: [name], female    DOB: [birth year], 61 y.o.   MRN: [id number]  HPI The patient is doing very well today. She states that the current medication regimen is helping her and she is not in a lot of pain. She states that she works out in [employer] regularly. She states, that she has stopped water aerobics, because she still has an open wound on her left calf, after a fall, s/p infection in August.Unfortunately the infection has returned, and she is back at the wound center again for treatment. Otherwise the problem has been stable. The patient also complains being more exhausted, because she has to take care of her husband who has been diagnosed with Alzheimers.  Pain Inventory Average Pain 6 Pain Right Now 7 My pain is stabbing  In the last 24 hours, has pain interfered with the following? General activity 7 Relation with others 7 Enjoyment of life 7 What TIME of day is your pain at its worst? morning Sleep (in general) Good  Pain is worse with: walking, standing and some activites Pain improves with: rest, medication and injections Relief from Meds: 6  Mobility walk without assistance ability to climb steps?  yes do you drive?  yes transfers alone Do you have any goals in this area?  no  Function disabled: date disabled 01/26/08 I need assistance with the following:  dressing, meal prep, household duties and shopping Do you have any goals in this area?  no  Neuro/Psych weakness depression anxiety  Prior Studies Any changes since last visit?  no  Physicians involved in your care Any changes since last visit?  no   Family History  Problem Relation Age of Onset  . Clotting disorder Mother     Coumadin  . Allergies Mother   . Heart disease Mother   . Stroke Father   . Heart disease Father   . Alzheimer's disease Father    History   Social History  . Marital Status: Married    Spouse Name: N/A    Number of Children: 1  . Years  of Education: N/A   Occupational History  . disability    Social History Main Topics  . Smoking status: Former Smoker -- 0.2 packs/day for 40 years    Types: Cigarettes    Quit date: 12/16/2011  . Smokeless tobacco: Never Used  . Alcohol Use: Yes     Comment: social  . Drug Use: No  . Sexually Active: None   Other Topics Concern  . None   Social History Narrative  . None   Past Surgical History  Procedure Date  . Total abdominal hysterectomy   . Tubal ligation   . Appendectomy   . Total knee arthroplasty     left  . Rotator cuff repair   . Foot fracture surgery   . Synovectomy of l thumb   . Fusion of r wrist   . Upper palate surgery for tmj syndrome   . Replacement total knee     left   Past Medical History  Diagnosis Date  . Diverticulosis   . Fibromyalgia   . Anemia   . Rheumatoid arthritis   . Anxiety   . Blood transfusion   . GERD (gastroesophageal reflux disease)   . Myalgia   . Rheumatoid arthritis   . Depression   . Joint effusion   . Calcifying tendinitis of shoulder   . Myalgia and myositis    BP  137/70  Pulse 84  Resp 14  Ht 5\' 2"  (1.575 m)  Wt 187 lb 6.4 oz (85.004 kg)  BMI 34.28 kg/m2  SpO2 96%    Review of Systems  Musculoskeletal: Positive for myalgias and arthralgias.  Neurological: Positive for weakness.  Psychiatric/Behavioral: Positive for dysphoric mood. The patient is nervous/anxious.   All other systems reviewed and are negative.       Objective:   Physical Exam Constitutional: She is oriented to person, place, and time. She appears well-developed and well-nourished.  HENT:  Head: Normocephalic.  Neck: Normal range of motion.  Musculoskeletal: She exhibits tenderness.  Neurological: She is alert and oriented to person, place, and time.  Skin: Skin is warm and dry.  Psychiatric: She has a normal mood and affect.  Symmetric normal motor tone is noted throughout. Normal muscle bulk, except atrophy on her hands  bilateral. Muscle testing reveals 5/5 muscle strength of the upper extremity, except wrist and finger muscles, and 5/5 of the lower extremity. Full range of motion in upper and lower extremities, except shoulders and wrist/ fingers bilateral. ROM of spine is restricted. Fine motor movements are restricted in both hands.  DTR in the upper and lower extremity are present and symmetric 2+. No clonus is noted.  Patient arises from chair without difficulty. Narrow based gait with normal arm swing bilateral .  Open wound on left calf, infected, follows up with wound care center.        Assessment & Plan:  This A 61 year old female with  1. Rheumatid arthritis  2. Fibromyalgia  3. Felty's syndrome  4. Mass in lung  5. Open wound on left calf,reinfected,treated by wound care center.  Plan :  Continue with medication. Continue with your exercise program.Patient has stopped water aerobics, because of the open wound, advised her to stay out of the pool until the wound has healed. Advised patient to organize some time for herself to relax and get strength to be able to care for her husband. The patient is doing pretty well with the current medication regimen, refilled her medication today.  Follow up in 1 month.

## 2012-06-07 NOTE — Patient Instructions (Signed)
Continue with your exercise program, as pain permits

## 2012-06-10 ENCOUNTER — Ambulatory Visit (HOSPITAL_BASED_OUTPATIENT_CLINIC_OR_DEPARTMENT_OTHER): Payer: Medicare Other

## 2012-06-11 ENCOUNTER — Encounter (HOSPITAL_BASED_OUTPATIENT_CLINIC_OR_DEPARTMENT_OTHER): Payer: Medicare Other | Attending: General Surgery

## 2012-06-11 DIAGNOSIS — I87319 Chronic venous hypertension (idiopathic) with ulcer of unspecified lower extremity: Secondary | ICD-10-CM | POA: Insufficient documentation

## 2012-06-11 DIAGNOSIS — L97909 Non-pressure chronic ulcer of unspecified part of unspecified lower leg with unspecified severity: Secondary | ICD-10-CM | POA: Insufficient documentation

## 2012-06-21 ENCOUNTER — Other Ambulatory Visit: Payer: Medicare Other | Admitting: Lab

## 2012-06-21 ENCOUNTER — Telehealth: Payer: Self-pay | Admitting: Oncology

## 2012-06-21 ENCOUNTER — Encounter: Payer: Self-pay | Admitting: Family

## 2012-06-21 ENCOUNTER — Other Ambulatory Visit (HOSPITAL_BASED_OUTPATIENT_CLINIC_OR_DEPARTMENT_OTHER): Payer: Medicare Other | Admitting: Lab

## 2012-06-21 ENCOUNTER — Ambulatory Visit (HOSPITAL_BASED_OUTPATIENT_CLINIC_OR_DEPARTMENT_OTHER): Payer: Medicare Other | Admitting: Family

## 2012-06-21 ENCOUNTER — Ambulatory Visit: Payer: Medicare Other | Admitting: Oncology

## 2012-06-21 VITALS — BP 126/75 | HR 89 | Temp 98.0°F | Resp 20 | Ht 62.0 in | Wt 188.1 lb

## 2012-06-21 DIAGNOSIS — M0500 Felty's syndrome, unspecified site: Secondary | ICD-10-CM

## 2012-06-21 DIAGNOSIS — M359 Systemic involvement of connective tissue, unspecified: Secondary | ICD-10-CM

## 2012-06-21 DIAGNOSIS — M949 Disorder of cartilage, unspecified: Secondary | ICD-10-CM

## 2012-06-21 DIAGNOSIS — D709 Neutropenia, unspecified: Secondary | ICD-10-CM

## 2012-06-21 DIAGNOSIS — D649 Anemia, unspecified: Secondary | ICD-10-CM

## 2012-06-21 LAB — CBC WITH DIFFERENTIAL/PLATELET
BASO%: 0.7 % (ref 0.0–2.0)
Basophils Absolute: 0 10*3/uL (ref 0.0–0.1)
EOS%: 0 % (ref 0.0–7.0)
HCT: 37.5 % (ref 34.8–46.6)
LYMPH%: 79.7 % — ABNORMAL HIGH (ref 14.0–49.7)
MCH: 29.9 pg (ref 25.1–34.0)
MCHC: 33.9 g/dL (ref 31.5–36.0)
MCV: 88.2 fL (ref 79.5–101.0)
NEUT%: 2 % — ABNORMAL LOW (ref 38.4–76.8)
Platelets: 150 10*3/uL (ref 145–400)

## 2012-06-21 LAB — COMPREHENSIVE METABOLIC PANEL (CC13)
AST: 14 U/L (ref 5–34)
Alkaline Phosphatase: 53 U/L (ref 40–150)
BUN: 11 mg/dL (ref 7.0–26.0)
Calcium: 9.3 mg/dL (ref 8.4–10.4)
Creatinine: 0.8 mg/dL (ref 0.6–1.1)
Glucose: 122 mg/dl — ABNORMAL HIGH (ref 70–99)

## 2012-06-21 NOTE — Patient Instructions (Addendum)
Please contact us at [phone] if you have any questions or concerns.  Please refer to neutropenic precautions information.  Results for orders placed in visit on 06/21/12 (from the past 24 hour(s))  CBC WITH DIFFERENTIAL     Status: Abnormal   Collection Time   06/21/12  1:33 PM      Component Value Range   WBC 1.5 (*) 3.9 - 10.3 10e3/uL   NEUT# 0.0 (*) 1.5 - 6.5 10e3/uL   HGB 12.7  11.6 - 15.9 g/dL   HCT 47.8  29.5 - 62.1 %   Platelets 150  145 - 400 10e3/uL   MCV 88.2  79.5 - 101.0 fL   MCH 29.9  25.1 - 34.0 pg   MCHC 33.9  31.5 - 36.0 g/dL   RBC 3.08  6.57 - 8.46 10e6/uL   RDW 15.3 (*) 11.2 - 14.5 %   lymph# 1.2  0.9 - 3.3 10e3/uL   MONO# 0.3  0.1 - 0.9 10e3/uL   Eosinophils Absolute 0.0  0.0 - 0.5 10e3/uL   Basophils Absolute 0.0  0.0 - 0.1 10e3/uL   NEUT% 2.0 (*) 38.4 - 76.8 %   LYMPH% 79.7 (*) 14.0 - 49.7 %   MONO% 17.6 (*) 0.0 - 14.0 %   EOS% 0.0  0.0 - 7.0 %   BASO% 0.7  0.0 - 2.0 %   nRBC 0  0 - 0 %   Narrative:    Performed At:  [facility]               [address]               [place], [place] [address]  COMPREHENSIVE METABOLIC PANEL (CC13)     Status: Abnormal   Collection Time   06/21/12  1:33 PM      Component Value Range   Sodium 137  136 - 145 mEq/L   Potassium 4.3  3.5 - 5.1 mEq/L   Chloride 101  98 - 107 mEq/L   CO2 27  22 - 29 mEq/L   Glucose 122 (*) 70 - 99 mg/dl   BUN 28.4  7.0 - 13.2 mg/dL   Creatinine 0.8  0.6 - 1.1 mg/dL   Total Bilirubin 4.40  0.20 - 1.20 mg/dL   Alkaline Phosphatase 53  40 - 150 U/L   AST 14  5 - 34 U/L   ALT 14  0 - 55 U/L   Total Protein 7.7  6.4 - 8.3 g/dL   Albumin 3.4 (*) 3.5 - 5.0 g/dL   Calcium 9.3  8.4 - 10.2 mg/dL   Narrative:    Has the patient fasted?->NoPerformed At:  [facility]               [address]               [place], [place] [address]  LACTATE DEHYDROGENASE (CC13)     Status: Normal   Collection Time   06/21/12  1:33 PM      Component Value Range   LDH 156  125 - 245  U/L   Narrative:    Performed At:  [facility]               [address]               [place], [place] [address]

## 2012-06-21 NOTE — Telephone Encounter (Signed)
appts made and printed for pt per lab orders and pt

## 2012-06-21 NOTE — Telephone Encounter (Signed)
appts made and printed for pt aom °
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
## 2012-07-06 ENCOUNTER — Encounter: Payer: Self-pay | Admitting: Physical Medicine and Rehabilitation

## 2012-07-06 ENCOUNTER — Encounter
Payer: Medicare Other | Attending: Physical Medicine and Rehabilitation | Admitting: Physical Medicine and Rehabilitation

## 2012-07-06 VITALS — BP 132/80 | HR 66 | Resp 14 | Ht 62.0 in | Wt 196.8 lb

## 2012-07-06 DIAGNOSIS — R222 Localized swelling, mass and lump, trunk: Secondary | ICD-10-CM | POA: Insufficient documentation

## 2012-07-06 DIAGNOSIS — M0500 Felty's syndrome, unspecified site: Secondary | ICD-10-CM | POA: Insufficient documentation

## 2012-07-06 DIAGNOSIS — S81009A Unspecified open wound, unspecified knee, initial encounter: Secondary | ICD-10-CM | POA: Insufficient documentation

## 2012-07-06 DIAGNOSIS — M069 Rheumatoid arthritis, unspecified: Secondary | ICD-10-CM | POA: Insufficient documentation

## 2012-07-06 DIAGNOSIS — IMO0001 Reserved for inherently not codable concepts without codable children: Secondary | ICD-10-CM | POA: Insufficient documentation

## 2012-07-06 DIAGNOSIS — X58XXXA Exposure to other specified factors, initial encounter: Secondary | ICD-10-CM | POA: Insufficient documentation

## 2012-07-06 DIAGNOSIS — M12819 Other specific arthropathies, not elsewhere classified, unspecified shoulder: Secondary | ICD-10-CM

## 2012-07-06 DIAGNOSIS — M19019 Primary osteoarthritis, unspecified shoulder: Secondary | ICD-10-CM

## 2012-07-06 MED ORDER — FENTANYL 50 MCG/HR TD PT72: 1.0000 | MEDICATED_PATCH | TRANSDERMAL | Status: DC

## 2012-07-06 MED ORDER — HYDROCODONE-ACETAMINOPHEN 7.5-325 MG PO TABS: 1.0000 | ORAL_TABLET | Freq: Four times a day (QID) | ORAL | Status: DC | PRN

## 2012-07-06 NOTE — Progress Notes (Signed)
Subjective:    Patient ID: [name], female    DOB: 1950-07-25, 62 y.o.   MRN: [id number]  HPI The patient is doing very well today. She states that the current medication regimen is helping her and she is not in a lot of pain. She states that she works out in [employer] regularly. She states, that she has stopped water aerobics, because she still has an open wound on her left calf, after a fall, s/p infection in August.Unfortunately the infection has returned, and she is back at the wound center again for treatment. Otherwise the problem has been stable. The patient states, that she is not that stressed out today , because her husband who has been diagnosed with Alzheimers, is doing a little better.  Pain Inventory Average Pain 5 Pain Right Now 5 My pain is aching  In the last 24 hours, has pain interfered with the following? General activity 5 Relation with others 4 Enjoyment of life 4 What TIME of day is your pain at its worst? morning Sleep (in general) Fair  Pain is worse with: walking and some activites Pain improves with: rest and medication Relief from Meds: 6  Mobility walk without assistance how many minutes can you walk? 20 ability to climb steps?  yes do you drive?  yes Do you have any goals in this area?  no  Function disabled: date disabled 01/26/08 I need assistance with the following:  dressing, meal prep, household duties and shopping Do you have any goals in this area?  no  Neuro/Psych numbness trouble walking depression anxiety  Prior Studies Any changes since last visit?  no  Physicians involved in your care Any changes since last visit?  no   Family History  Problem Relation Age of Onset  . Clotting disorder Mother     Coumadin  . Allergies Mother   . Heart disease Mother   . Stroke Father   . Heart disease Father   . Alzheimer's disease Father    History   Social History  . Marital Status: Married    Spouse Name: N/A    Number  of Children: 1  . Years of Education: N/A   Occupational History  . disability    Social History Main Topics  . Smoking status: Former Smoker -- 0.2 packs/day for 40 years    Types: Cigarettes    Quit date: 12/16/2011  . Smokeless tobacco: Never Used  . Alcohol Use: Yes     Comment: social  . Drug Use: No  . Sexually Active: None   Other Topics Concern  . None   Social History Narrative  . None   Past Surgical History  Procedure Date  . Total abdominal hysterectomy   . Tubal ligation   . Appendectomy   . Total knee arthroplasty     left  . Rotator cuff repair   . Foot fracture surgery   . Synovectomy of l thumb   . Fusion of r wrist   . Upper palate surgery for tmj syndrome   . Replacement total knee     left   Past Medical History  Diagnosis Date  . Diverticulosis   . Fibromyalgia   . Anemia   . Rheumatoid arthritis   . Anxiety   . Blood transfusion   . GERD (gastroesophageal reflux disease)   . Myalgia   . Rheumatoid arthritis   . Depression   . Joint effusion   . Calcifying tendinitis of shoulder   .  Myalgia and myositis    BP 132/80  Pulse 66  Resp 14  Ht 5\' 2"  (1.575 m)  Wt 196 lb 12.8 oz (89.268 kg)  BMI 36.00 kg/m2    Review of Systems  Musculoskeletal: Positive for gait problem.  Neurological: Positive for numbness.  Psychiatric/Behavioral: Positive for dysphoric mood. The patient is nervous/anxious.   All other systems reviewed and are negative.       Objective:   Physical Exam Constitutional: She is oriented to person, place, and time. She appears well-developed and well-nourished.  HENT:  Head: Normocephalic.  Neck: Normal range of motion.  Musculoskeletal: She exhibits tenderness.  Neurological: She is alert and oriented to person, place, and time.  Skin: Skin is warm and dry.  Psychiatric: She has a normal mood and affect.  Symmetric normal motor tone is noted throughout. Normal muscle bulk, except atrophy on her hands  bilateral. Muscle testing reveals 5/5 muscle strength of the upper extremity, except wrist and finger muscles, and 5/5 of the lower extremity. Full range of motion in upper and lower extremities, except shoulders and wrist/ fingers bilateral. ROM of spine is restricted. Fine motor movements are restricted in both hands.  DTR in the upper and lower extremity are present and symmetric 2+. No clonus is noted.  Patient arises from chair without difficulty. Narrow based gait with normal arm swing bilateral .  Open wound on left calf, infected, follows up with wound care center.        Assessment & Plan:  This a 62 year old female with  1. Rheumatid arthritis  2. Fibromyalgia  3. Felty's syndrome  4. Mass in lung  5. Open wound on left calf,reinfected,treated by wound care center.  Plan :  Continue with medication. Continue with your exercise program.Patient has stopped water aerobics, because of the open wound, advised her to stay out of the pool until the wound has healed. Advised patient to organize some time for herself to relax and get strength to be able to care for her husband. The patient is doing pretty well with the current medication regimen, refilled her medication today.  Follow up in 1 month.

## 2012-07-06 NOTE — Patient Instructions (Signed)
Continue with your exercise program 

## 2012-07-14 ENCOUNTER — Encounter (HOSPITAL_BASED_OUTPATIENT_CLINIC_OR_DEPARTMENT_OTHER): Payer: Medicare Other | Attending: General Surgery

## 2012-07-14 DIAGNOSIS — L97909 Non-pressure chronic ulcer of unspecified part of unspecified lower leg with unspecified severity: Secondary | ICD-10-CM | POA: Insufficient documentation

## 2012-07-15 ENCOUNTER — Ambulatory Visit (HOSPITAL_BASED_OUTPATIENT_CLINIC_OR_DEPARTMENT_OTHER): Payer: Medicare Other

## 2012-07-16 ENCOUNTER — Encounter (HOSPITAL_BASED_OUTPATIENT_CLINIC_OR_DEPARTMENT_OTHER): Payer: Medicare Other

## 2012-07-20 ENCOUNTER — Other Ambulatory Visit: Payer: Self-pay | Admitting: Physical Medicine & Rehabilitation

## 2012-07-28 ENCOUNTER — Telehealth: Payer: Self-pay

## 2012-07-28 MED ORDER — DULOXETINE HCL 60 MG PO CPEP: ORAL_CAPSULE | ORAL | Status: DC

## 2012-07-28 NOTE — Telephone Encounter (Signed)
Patient request cymbalta refill.  This has been sent to pharmacy.  Patient aware.

## 2012-07-31 ENCOUNTER — Other Ambulatory Visit: Payer: Self-pay | Admitting: Oncology

## 2012-08-02 ENCOUNTER — Other Ambulatory Visit: Payer: Self-pay | Admitting: *Deleted

## 2012-08-02 ENCOUNTER — Other Ambulatory Visit: Payer: Self-pay | Admitting: Oncology

## 2012-08-02 DIAGNOSIS — D72819 Decreased white blood cell count, unspecified: Secondary | ICD-10-CM

## 2012-08-02 MED ORDER — PREDNISONE 10 MG PO TABS: ORAL_TABLET | ORAL | Status: DC

## 2012-08-04 ENCOUNTER — Encounter: Payer: Self-pay | Admitting: Physical Medicine and Rehabilitation

## 2012-08-04 ENCOUNTER — Encounter
Payer: Medicare Other | Attending: Physical Medicine and Rehabilitation | Admitting: Physical Medicine and Rehabilitation

## 2012-08-04 VITALS — BP 114/74 | HR 100 | Resp 14 | Ht 62.0 in | Wt 191.0 lb

## 2012-08-04 DIAGNOSIS — S81009A Unspecified open wound, unspecified knee, initial encounter: Secondary | ICD-10-CM | POA: Insufficient documentation

## 2012-08-04 DIAGNOSIS — IMO0001 Reserved for inherently not codable concepts without codable children: Secondary | ICD-10-CM | POA: Insufficient documentation

## 2012-08-04 DIAGNOSIS — Z96659 Presence of unspecified artificial knee joint: Secondary | ICD-10-CM | POA: Insufficient documentation

## 2012-08-04 DIAGNOSIS — M0500 Felty's syndrome, unspecified site: Secondary | ICD-10-CM | POA: Insufficient documentation

## 2012-08-04 DIAGNOSIS — M069 Rheumatoid arthritis, unspecified: Secondary | ICD-10-CM | POA: Insufficient documentation

## 2012-08-04 DIAGNOSIS — K219 Gastro-esophageal reflux disease without esophagitis: Secondary | ICD-10-CM | POA: Insufficient documentation

## 2012-08-04 DIAGNOSIS — X58XXXA Exposure to other specified factors, initial encounter: Secondary | ICD-10-CM | POA: Insufficient documentation

## 2012-08-04 DIAGNOSIS — R222 Localized swelling, mass and lump, trunk: Secondary | ICD-10-CM | POA: Insufficient documentation

## 2012-08-04 MED ORDER — HYDROCODONE-ACETAMINOPHEN 7.5-325 MG PO TABS: 1.0000 | ORAL_TABLET | Freq: Four times a day (QID) | ORAL | Status: DC | PRN

## 2012-08-04 MED ORDER — FENTANYL 50 MCG/HR TD PT72: 1.0000 | MEDICATED_PATCH | TRANSDERMAL | Status: DC

## 2012-08-04 NOTE — Progress Notes (Signed)
Subjective:    Patient ID: [name], female    DOB: 02-03-1951, 62 y.o.   MRN: [id number]  HPI The patient is doing very well today. She states that the current medication regimen is helping her and she is not in a lot of pain. She states that she works out in [employer] regularly. She states, that she has stopped water aerobics, because she still has an open wound on her left calf, after a fall, s/p infection in August.Unfortunately the infection has returned, and she is back at the wound center again for treatment. Otherwise the problem has been stable. The patient states, that she is stressed out today , because her husband who has been diagnosed with Alzheimers, is getting worse.  Pain Inventory Average Pain 6 Pain Right Now 6 My pain is aching  In the last 24 hours, has pain interfered with the following? General activity 7 Relation with others 6 Enjoyment of life 7 What TIME of day is your pain at its worst? morning Sleep (in general) Fair  Pain is worse with: walking Pain improves with: medication and injections Relief from Meds: 7  Mobility walk without assistance how many minutes can you walk? 10 ability to climb steps?  yes do you drive?  yes Do you have any goals in this area?  no  Function disabled: date disabled 08/09 I need assistance with the following:  dressing, meal prep, household duties and shopping  Neuro/Psych trouble walking depression anxiety  Prior Studies Any changes since last visit?  no  Physicians involved in your care Any changes since last visit?  no   Family History  Problem Relation Age of Onset  . Clotting disorder Mother     Coumadin  . Allergies Mother   . Heart disease Mother   . Stroke Father   . Heart disease Father   . Alzheimer's disease Father    History   Social History  . Marital Status: Married    Spouse Name: N/A    Number of Children: 1  . Years of Education: N/A   Occupational History  . disability     Social History Main Topics  . Smoking status: Former Smoker -- 0.20 packs/day for 40 years    Types: Cigarettes    Quit date: 12/16/2011  . Smokeless tobacco: Never Used  . Alcohol Use: Yes     Comment: social  . Drug Use: No  . Sexually Active: None   Other Topics Concern  . None   Social History Narrative  . None   Past Surgical History  Procedure Laterality Date  . Total abdominal hysterectomy    . Tubal ligation    . Appendectomy    . Total knee arthroplasty      left  . Rotator cuff repair    . Foot fracture surgery    . Synovectomy of l thumb    . Fusion of r wrist    . Upper palate surgery for tmj syndrome    . Replacement total knee      left   Past Medical History  Diagnosis Date  . Diverticulosis   . Fibromyalgia   . Anemia   . Rheumatoid arthritis   . Anxiety   . Blood transfusion   . GERD (gastroesophageal reflux disease)   . Myalgia   . Rheumatoid arthritis   . Depression   . Joint effusion   . Calcifying tendinitis of shoulder   . Myalgia and myositis  BP 114/74  Pulse 100  Resp 14  Ht 5\' 2"  (1.575 m)  Wt 191 lb (86.637 kg)  BMI 34.93 kg/m2  SpO2 97%     Review of Systems  Musculoskeletal: Positive for gait problem.  Psychiatric/Behavioral: Positive for dysphoric mood. The patient is nervous/anxious.   All other systems reviewed and are negative.       Objective:   Physical Exam Constitutional: She is oriented to person, place, and time. She appears well-developed and well-nourished.  HENT:  Head: Normocephalic.  Neck: Normal range of motion.  Musculoskeletal: She exhibits tenderness.  Neurological: She is alert and oriented to person, place, and time.  Skin: Skin is warm and dry.  Psychiatric: She has a normal mood and affect.  Symmetric normal motor tone is noted throughout. Normal muscle bulk, except atrophy on her hands bilateral. Muscle testing reveals 5/5 muscle strength of the upper extremity, except wrist and  finger muscles, and 5/5 of the lower extremity. Full range of motion in upper and lower extremities, except shoulders and wrist/ fingers bilateral. ROM of spine is restricted. Fine motor movements are restricted in both hands.  DTR in the upper and lower extremity are present and symmetric 2+. No clonus is noted.  Patient arises from chair without difficulty. Narrow based gait with normal arm swing bilateral .  Open wound on left calf, infected, follows up with wound care center, finally healed.        Assessment & Plan:  This a 62 year old female with  1. Rheumatid arthritis  2. Fibromyalgia  3. Felty's syndrome  4. Mass in lung  5. Open wound on left calf,reinfected,treated by wound care center.  Plan :  Continue with medication. Continue with your exercise program.Patient has stopped water aerobics, because of the open wound, advised her to restart in a couple of weeks.  Advised patient to organize some time for herself to relax and get strength to be able to care for her husband. The patient is doing pretty well with the current medication regimen, refilled her medication today.  Follow up in 1 month.

## 2012-08-04 NOTE — Patient Instructions (Signed)
Restart aquatic exercises when wound care approves.

## 2012-08-16 ENCOUNTER — Other Ambulatory Visit (HOSPITAL_BASED_OUTPATIENT_CLINIC_OR_DEPARTMENT_OTHER): Payer: Medicare Other | Admitting: Lab

## 2012-08-16 DIAGNOSIS — D708 Other neutropenia: Secondary | ICD-10-CM

## 2012-08-16 DIAGNOSIS — D649 Anemia, unspecified: Secondary | ICD-10-CM

## 2012-08-16 LAB — CBC WITH DIFFERENTIAL/PLATELET
BASO%: 0.7 % (ref 0.0–2.0)
HCT: 35.8 % (ref 34.8–46.6)
HGB: 12.3 g/dL (ref 11.6–15.9)
MCHC: 34.4 g/dL (ref 31.5–36.0)
MONO#: 0.3 10*3/uL (ref 0.1–0.9)
NEUT%: 3.5 % — ABNORMAL LOW (ref 38.4–76.8)
WBC: 1.6 10*3/uL — ABNORMAL LOW (ref 3.9–10.3)
lymph#: 1.2 10*3/uL (ref 0.9–3.3)

## 2012-08-31 ENCOUNTER — Encounter: Payer: Self-pay | Admitting: Physical Medicine and Rehabilitation

## 2012-08-31 ENCOUNTER — Encounter
Payer: Medicare Other | Attending: Physical Medicine and Rehabilitation | Admitting: Physical Medicine and Rehabilitation

## 2012-08-31 VITALS — BP 137/71 | HR 100 | Resp 17 | Ht 62.5 in | Wt 190.6 lb

## 2012-08-31 DIAGNOSIS — Z96659 Presence of unspecified artificial knee joint: Secondary | ICD-10-CM | POA: Insufficient documentation

## 2012-08-31 DIAGNOSIS — M069 Rheumatoid arthritis, unspecified: Secondary | ICD-10-CM | POA: Insufficient documentation

## 2012-08-31 DIAGNOSIS — M0500 Felty's syndrome, unspecified site: Secondary | ICD-10-CM | POA: Insufficient documentation

## 2012-08-31 DIAGNOSIS — R222 Localized swelling, mass and lump, trunk: Secondary | ICD-10-CM | POA: Insufficient documentation

## 2012-08-31 DIAGNOSIS — IMO0001 Reserved for inherently not codable concepts without codable children: Secondary | ICD-10-CM | POA: Insufficient documentation

## 2012-08-31 DIAGNOSIS — K219 Gastro-esophageal reflux disease without esophagitis: Secondary | ICD-10-CM | POA: Insufficient documentation

## 2012-08-31 NOTE — Patient Instructions (Signed)
Continue with your aquatic exercises 

## 2012-08-31 NOTE — Progress Notes (Signed)
Subjective:    Patient ID: [name], female    DOB: 08-26-1950, 62 y.o.   MRN: [id number]  HPI The patient is doing well concerning her chronic pain from her Fibromyalgia and RA. She states that the current medication regimen is helping her and she is not in a lot of pain. She states that she works out in [employer] regularly. She states, that she has restarted her water aerobics. The problem has been stable. The patient states, that she is stressed out , because her husband who has been diagnosed with Alzheimers, is getting worse.  Pain Inventory Average Pain 6 Pain Right Now 8 My pain is sharp and aching  In the last 24 hours, has pain interfered with the following? General activity 7 Relation with others 6 Enjoyment of life 7 What TIME of day is your pain at its worst? morning Sleep (in general) Good  Pain is worse with: walking and bending Pain improves with: medication Relief from Meds: 5  Mobility walk without assistance how many minutes can you walk? 15 min. ability to climb steps?  yes do you drive?  yes Do you have any goals in this area?  no  Function disabled: date disabled 01-16-08 I need assistance with the following:  dressing, meal prep, household duties and shopping Do you have any goals in this area?  no  Neuro/Psych trouble walking depression anxiety  Prior Studies Any changes since last visit?  no  Physicians involved in your care Any changes since last visit?  no   Family History  Problem Relation Age of Onset  . Clotting disorder Mother     Coumadin  . Allergies Mother   . Heart disease Mother   . Stroke Father   . Heart disease Father   . Alzheimer's disease Father    History   Social History  . Marital Status: Married    Spouse Name: N/A    Number of Children: 1  . Years of Education: N/A   Occupational History  . disability    Social History Main Topics  . Smoking status: Former Smoker -- 0.20 packs/day for 40 years   Types: Cigarettes    Quit date: 12/16/2011  . Smokeless tobacco: Never Used  . Alcohol Use: Yes     Comment: social  . Drug Use: No  . Sexually Active: None   Other Topics Concern  . None   Social History Narrative  . None   Past Surgical History  Procedure Laterality Date  . Total abdominal hysterectomy    . Tubal ligation    . Appendectomy    . Total knee arthroplasty      left  . Rotator cuff repair    . Foot fracture surgery    . Synovectomy of l thumb    . Fusion of r wrist    . Upper palate surgery for tmj syndrome    . Replacement total knee      left   Past Medical History  Diagnosis Date  . Diverticulosis   . Fibromyalgia   . Anemia   . Rheumatoid arthritis   . Anxiety   . Blood transfusion   . GERD (gastroesophageal reflux disease)   . Myalgia   . Rheumatoid arthritis   . Depression   . Joint effusion   . Calcifying tendinitis of shoulder   . Myalgia and myositis    BP 137/71  Pulse 100  Resp 17  Ht 5' 2.5" (1.588 m)  Wt 190 lb 9.6 oz (86.456 kg)  BMI 34.28 kg/m2  SpO2 95%     Review of Systems  Musculoskeletal: Positive for gait problem.  Psychiatric/Behavioral: Positive for dysphoric mood and agitation.  All other systems reviewed and are negative.       Objective:   Physical Exam Constitutional: She is oriented to person, place, and time. She appears well-developed and well-nourished.  HENT:  Head: Normocephalic.  Neck: Normal range of motion.  Musculoskeletal: She exhibits tenderness.  Neurological: She is alert and oriented to person, place, and time.  Skin: Skin is warm and dry.  Psychiatric: She has a normal mood and affect.  Symmetric normal motor tone is noted throughout. Normal muscle bulk, except atrophy on her hands bilateral. Muscle testing reveals 5/5 muscle strength of the upper extremity, except wrist and finger muscles, and 5/5 of the lower extremity. Full range of motion in upper and lower extremities, except  shoulders and wrist/ fingers bilateral. ROM of spine is restricted. Fine motor movements are restricted in both hands.  DTR in the upper and lower extremity are present and symmetric 2+. No clonus is noted.  Patient arises from chair without difficulty. Narrow based gait with normal arm swing bilateral .  Open wound on left calf, infected, follows up with wound care center, finally healed.        Assessment & Plan:  This a 62 year old female with  1. Rheumatid arthritis  2. Fibromyalgia  3. Felty's syndrome  4. Mass in lung  5. Open wound on left calf,reinfected,treated by wound care center, has finally healed, patient has restarted her aquatic exercises.  Plan :  Continue with medication. Continue with your exercise program. Advised patient to organize some time for herself to relax and get strength to be able to care for her husband. The patient is doing pretty well with the current medication regimen, patient does not need a refill today.  Follow up in 1 month.

## 2012-09-13 ENCOUNTER — Other Ambulatory Visit: Payer: Self-pay | Admitting: Medical Oncology

## 2012-09-13 ENCOUNTER — Encounter: Payer: Self-pay | Admitting: Medical Oncology

## 2012-09-13 MED ORDER — MAGIC MOUTHWASH W/LIDOCAINE: 5.0000 mL | Freq: Three times a day (TID) | ORAL | Status: AC | PRN

## 2012-09-15 ENCOUNTER — Other Ambulatory Visit: Payer: Self-pay | Admitting: Medical Oncology

## 2012-09-20 ENCOUNTER — Other Ambulatory Visit: Payer: Self-pay | Admitting: Physical Medicine & Rehabilitation

## 2012-09-20 ENCOUNTER — Encounter: Payer: Self-pay | Admitting: Physical Medicine and Rehabilitation

## 2012-09-20 ENCOUNTER — Encounter
Payer: Medicare Other | Attending: Physical Medicine and Rehabilitation | Admitting: Physical Medicine and Rehabilitation

## 2012-09-20 VITALS — BP 110/61 | HR 104 | Resp 14 | Ht 62.0 in | Wt 189.0 lb

## 2012-09-20 DIAGNOSIS — IMO0001 Reserved for inherently not codable concepts without codable children: Secondary | ICD-10-CM | POA: Insufficient documentation

## 2012-09-20 DIAGNOSIS — M069 Rheumatoid arthritis, unspecified: Secondary | ICD-10-CM | POA: Insufficient documentation

## 2012-09-20 DIAGNOSIS — X58XXXA Exposure to other specified factors, initial encounter: Secondary | ICD-10-CM | POA: Insufficient documentation

## 2012-09-20 MED ORDER — DICLOFENAC SODIUM 1 % TD GEL: 2.0000 g | Freq: Four times a day (QID) | TRANSDERMAL | Status: DC

## 2012-09-20 MED ORDER — HYDROCODONE-ACETAMINOPHEN 7.5-325 MG PO TABS: 1.0000 | ORAL_TABLET | Freq: Four times a day (QID) | ORAL | Status: DC | PRN

## 2012-09-20 MED ORDER — CYCLOBENZAPRINE HCL 10 MG PO TABS: 10.0000 mg | ORAL_TABLET | Freq: Three times a day (TID) | ORAL | Status: DC | PRN

## 2012-09-20 MED ORDER — FENTANYL 50 MCG/HR TD PT72: 1.0000 | MEDICATED_PATCH | TRANSDERMAL | Status: DC

## 2012-09-20 NOTE — Patient Instructions (Addendum)
Try to stay as active as pain allows. Try to see your rheumatologist earlier than your scheduled appointment in May.

## 2012-09-20 NOTE — Progress Notes (Signed)
Subjective:    Patient ID: [name], female    DOB: 1951-02-09, 62 y.o.   MRN: [id number]  HPI The patient is complaining about an increase of her chronic pain from her Fibromyalgia and RA. She complain about joint and muscle pain.   The patient states, that she is stressed out , because her husband who has been diagnosed with Alzheimers, is getting worse.  Pain Inventory Average Pain 6 Pain Right Now 6 My pain is aching  In the last 24 hours, has pain interfered with the following? General activity 9 Relation with others 9 Enjoyment of life 9 What TIME of day is your pain at its worst? morning Sleep (in general) Good  Pain is worse with: walking, standing and some activites Pain improves with: rest, medication and injections Relief from Meds: 5  Mobility walk without assistance how many minutes can you walk? 5 ability to climb steps?  yes do you drive?  yes transfers alone Do you have any goals in this area?  no  Function disabled: date disabled 09 I need assistance with the following:  dressing, meal prep, household duties and shopping Do you have any goals in this area?  no  Neuro/Psych weakness trouble walking depression anxiety  Prior Studies Any changes since last visit?  no  Physicians involved in your care Any changes since last visit?  no   Family History  Problem Relation Age of Onset  . Clotting disorder Mother     Coumadin  . Allergies Mother   . Heart disease Mother   . Stroke Father   . Heart disease Father   . Alzheimer's disease Father    History   Social History  . Marital Status: Married    Spouse Name: N/A    Number of Children: 1  . Years of Education: N/A   Occupational History  . disability    Social History Main Topics  . Smoking status: Former Smoker -- 0.20 packs/day for 40 years    Types: Cigarettes    Quit date: 12/16/2011  . Smokeless tobacco: Never Used  . Alcohol Use: Yes     Comment: social  . Drug  Use: No  . Sexually Active: None   Other Topics Concern  . None   Social History Narrative  . None   Past Surgical History  Procedure Laterality Date  . Total abdominal hysterectomy    . Tubal ligation    . Appendectomy    . Total knee arthroplasty      left  . Rotator cuff repair    . Foot fracture surgery    . Synovectomy of l thumb    . Fusion of r wrist    . Upper palate surgery for tmj syndrome    . Replacement total knee      left   Past Medical History  Diagnosis Date  . Diverticulosis   . Fibromyalgia   . Anemia   . Rheumatoid arthritis   . Anxiety   . Blood transfusion   . GERD (gastroesophageal reflux disease)   . Myalgia   . Rheumatoid arthritis   . Depression   . Joint effusion   . Calcifying tendinitis of shoulder   . Myalgia and myositis    BP 110/61  Pulse 104  Resp 14  Ht 5\' 2"  (1.575 m)  Wt 189 lb (85.73 kg)  BMI 34.56 kg/m2  SpO2 99%     Review of Systems  Constitutional: Positive for unexpected weight  change.  Musculoskeletal: Positive for gait problem.  Neurological: Positive for weakness.  Hematological: Bruises/bleeds easily.  Psychiatric/Behavioral: Positive for dysphoric mood. The patient is nervous/anxious.   All other systems reviewed and are negative.       Objective:   Physical Exam Physical Exam  Constitutional: She is oriented to person, place, and time. She appears well-developed and well-nourished.  HENT:  Head: Normocephalic.  Neck: Normal range of motion.  Musculoskeletal: She exhibits tenderness.  Neurological: She is alert and oriented to person, place, and time.  Skin: Skin is warm and dry.  Psychiatric: She has a normal mood and affect.  Symmetric normal motor tone is noted throughout. Normal muscle bulk, except atrophy on her hands bilateral. Muscle testing reveals 5/5 muscle strength of the upper extremity, except wrist and finger muscles, and 5/5 of the lower extremity. Full range of motion in upper and  lower extremities, except shoulders and wrist/ fingers bilateral. ROM of spine is restricted. Fine motor movements are restricted in both hands.  DTR in the upper and lower extremity are present and symmetric 2+. No clonus is noted.  Patient arises from chair without difficulty. Narrow based gait with normal arm swing bilateral .  Open wound on left calf, infected, follows up with wound care center, finally healed.        Assessment & Plan:  This a 62 year old female with  1. Rheumatid arthritis, refilled Voltaren gel, to use for her increased joint pain  2. Fibromyalgia , refilled Flexeril, she can take it tid prn muscle spasms/pain 3. Felty's syndrome  4. Mass in lung  5. Open wound on left calf,reinfected,treated by wound care center, has finally healed, patient has restarted her aquatic exercises.  Plan :  Continue with medication. Follow up with your Rheumatologist for this flare up.  Continue with your exercise program as pain permits.. Advised patient to organize some time for herself to relax and get strength to be able to care for her husband. The patient is usually doing pretty well with the current medication regimen.  Follow up in 1 month.

## 2012-09-29 ENCOUNTER — Ambulatory Visit: Payer: Medicare Other | Admitting: Physical Medicine and Rehabilitation

## 2012-09-30 ENCOUNTER — Other Ambulatory Visit: Payer: Self-pay | Admitting: Physical Medicine & Rehabilitation

## 2012-10-11 ENCOUNTER — Encounter: Payer: Self-pay | Admitting: Oncology

## 2012-10-11 NOTE — Progress Notes (Signed)
I received a call from Dr. Alben Deeds, the patient's rheumatologist, today.  He would like to put the patient on weekly methotrexate for her rheumatoid arthritis and Felty's syndrome. The patient's last ANC was 0.1 on 08/16/2012.  He will monitor CBCs every couple weeks.  I have been checking CBCs every 2 months. I believe I am due to see the patient in mid May.

## 2012-10-18 ENCOUNTER — Encounter: Payer: Self-pay | Admitting: Physical Medicine and Rehabilitation

## 2012-10-18 ENCOUNTER — Encounter
Payer: Medicare Other | Attending: Physical Medicine and Rehabilitation | Admitting: Physical Medicine and Rehabilitation

## 2012-10-18 VITALS — BP 127/39 | HR 98 | Resp 16 | Ht 62.0 in | Wt 189.0 lb

## 2012-10-18 DIAGNOSIS — S81009A Unspecified open wound, unspecified knee, initial encounter: Secondary | ICD-10-CM | POA: Insufficient documentation

## 2012-10-18 DIAGNOSIS — IMO0001 Reserved for inherently not codable concepts without codable children: Secondary | ICD-10-CM | POA: Insufficient documentation

## 2012-10-18 DIAGNOSIS — M069 Rheumatoid arthritis, unspecified: Secondary | ICD-10-CM | POA: Insufficient documentation

## 2012-10-18 DIAGNOSIS — G8929 Other chronic pain: Secondary | ICD-10-CM | POA: Insufficient documentation

## 2012-10-18 DIAGNOSIS — Z5181 Encounter for therapeutic drug level monitoring: Secondary | ICD-10-CM

## 2012-10-18 DIAGNOSIS — M0500 Felty's syndrome, unspecified site: Secondary | ICD-10-CM | POA: Insufficient documentation

## 2012-10-18 DIAGNOSIS — R222 Localized swelling, mass and lump, trunk: Secondary | ICD-10-CM | POA: Insufficient documentation

## 2012-10-18 DIAGNOSIS — X58XXXA Exposure to other specified factors, initial encounter: Secondary | ICD-10-CM | POA: Insufficient documentation

## 2012-10-18 DIAGNOSIS — Z79899 Other long term (current) drug therapy: Secondary | ICD-10-CM

## 2012-10-18 MED ORDER — HYDROCODONE-ACETAMINOPHEN 7.5-325 MG PO TABS: 1.0000 | ORAL_TABLET | Freq: Four times a day (QID) | ORAL | Status: DC | PRN

## 2012-10-18 MED ORDER — FENTANYL 50 MCG/HR TD PT72: 1.0000 | MEDICATED_PATCH | TRANSDERMAL | Status: DC

## 2012-10-18 NOTE — Progress Notes (Signed)
Subjective:    Patient ID: [name], female    DOB: [birth year], 62 y.o.   MRN: [id number]  HPI The patient is complaining about an increase of her chronic pain from her Fibromyalgia and RA. She complain about joint and muscle pain. Her increased pain at the last visit has improved after I prescribed her the Flexeril and Voltaren gel. The patient states, that she had bronchitis, and was on Antibiotics, she is still coughing, especially at night. The patient states, that her husband has been diagnosed with Alzheimers, he is doing considerably well at this time.  Pain Inventory Average Pain 6 Pain Right Now 7 My pain is sharp and stabbing  In the last 24 hours, has pain interfered with the following? General activity 5 Relation with others 6 Enjoyment of life 6 What TIME of day is your pain at its worst? morning Sleep (in general) Fair  Pain is worse with: walking, bending, standing and some activites Pain improves with: rest, medication and injections Relief from Meds: 6  Mobility walk without assistance how many minutes can you walk? 15 ability to climb steps?  yes do you drive?  yes transfers alone Do you have any goals in this area?  no  Function disabled: date disabled 2009 I need assistance with the following:  dressing, meal prep, household duties and shopping Do you have any goals in this area?  no  Neuro/Psych numbness spasms depression anxiety  Prior Studies Any changes since last visit?  no  Physicians involved in your care Any changes since last visit?  no   Family History  Problem Relation Age of Onset  . Clotting disorder Mother     Coumadin  . Allergies Mother   . Heart disease Mother   . Stroke Father   . Heart disease Father   . Alzheimer's disease Father    History   Social History  . Marital Status: Married    Spouse Name: N/A    Number of Children: 1  . Years of Education: N/A   Occupational History  . disability     Social History Main Topics  . Smoking status: Former Smoker -- 0.20 packs/day for 40 years    Types: Cigarettes    Quit date: 12/16/2011  . Smokeless tobacco: Never Used  . Alcohol Use: Yes     Comment: social  . Drug Use: No  . Sexually Active: None   Other Topics Concern  . None   Social History Narrative  . None   Past Surgical History  Procedure Laterality Date  . Total abdominal hysterectomy    . Tubal ligation    . Appendectomy    . Total knee arthroplasty      left  . Rotator cuff repair    . Foot fracture surgery    . Synovectomy of l thumb    . Fusion of r wrist    . Upper palate surgery for tmj syndrome    . Replacement total knee      left   Past Medical History  Diagnosis Date  . Diverticulosis   . Fibromyalgia   . Anemia   . Rheumatoid arthritis   . Anxiety   . Blood transfusion   . GERD (gastroesophageal reflux disease)   . Myalgia   . Rheumatoid arthritis   . Depression   . Joint effusion   . Calcifying tendinitis of shoulder   . Myalgia and myositis    BP 127/39  Pulse 98  Resp 16  Ht 5\' 2"  (1.575 m)  Wt 189 lb (85.73 kg)  BMI 34.56 kg/m2  SpO2 93%     Review of Systems  Constitutional: Positive for fever.  Respiratory: Positive for cough and wheezing.   Gastrointestinal: Positive for nausea, vomiting and diarrhea.  Neurological: Positive for numbness.  Psychiatric/Behavioral: Positive for dysphoric mood. The patient is nervous/anxious.   All other systems reviewed and are negative.       Objective:   Physical Exam  Constitutional: She is oriented to person, place, and time. She appears well-developed and well-nourished.  HENT:  Head: Normocephalic.  Neck: Normal range of motion.  Musculoskeletal: She exhibits tenderness.  Neurological: She is alert and oriented to person, place, and time.  Skin: Skin is warm and dry.  Psychiatric: She has a normal mood and affect.  Symmetric normal motor tone is noted throughout.  Normal muscle bulk, except atrophy on her hands bilateral. Muscle testing reveals 5/5 muscle strength of the upper extremity, except wrist and finger muscles, and 5/5 of the lower extremity. Full range of motion in upper and lower extremities, except shoulders and wrist/ fingers bilateral. ROM of spine is restricted. Fine motor movements are restricted in both hands.  DTR in the upper and lower extremity are present and symmetric 2+. No clonus is noted.  Patient arises from chair without difficulty. Narrow based gait with normal arm swing bilateral .  Open wound on left calf, finally healed.       Assessment & Plan:  This a 62 year old female with  1. Rheumatid arthritis, refilled Voltaren gel, to use for her increased joint pain  2. Fibromyalgia , refilled Flexeril, she can take it tid prn muscle spasms/pain  3. Felty's syndrome  4. Mass in lung  5. Open wound on left calf,reinfected,treated by wound care center, has finally healed, patient has not been able to do her aquatic exercises, because of a bronchitis.  Plan :  Continue with medication. Follow up with your Rheumatologist for this flare up.  Continue with your exercise program as pain permits.. Advised patient to organize some time for herself to relax and get strength to be able to care for her husband. The patient is usually doing pretty well with the current medication regimen.  Follow up in 1 month.

## 2012-10-18 NOTE — Patient Instructions (Addendum)
Continue with your aquatic exercises, when you feel better .

## 2012-10-19 ENCOUNTER — Other Ambulatory Visit: Payer: Self-pay | Admitting: Medical Oncology

## 2012-10-19 ENCOUNTER — Ambulatory Visit (HOSPITAL_COMMUNITY)
Admission: RE | Admit: 2012-10-19 | Discharge: 2012-10-19 | Disposition: A | Discharge status: Home / Self Care | Payer: Medicare Other | Source: Ambulatory Visit | Attending: Family | Admitting: Family

## 2012-10-19 ENCOUNTER — Encounter: Payer: Self-pay | Admitting: Oncology

## 2012-10-19 ENCOUNTER — Ambulatory Visit (HOSPITAL_BASED_OUTPATIENT_CLINIC_OR_DEPARTMENT_OTHER): Payer: Medicare Other | Admitting: Oncology

## 2012-10-19 ENCOUNTER — Other Ambulatory Visit (HOSPITAL_BASED_OUTPATIENT_CLINIC_OR_DEPARTMENT_OTHER): Payer: Medicare Other

## 2012-10-19 ENCOUNTER — Ambulatory Visit: Payer: Medicare Other | Admitting: Lab

## 2012-10-19 ENCOUNTER — Encounter (HOSPITAL_COMMUNITY): Payer: Self-pay

## 2012-10-19 VITALS — BP 116/74 | HR 88 | Temp 98.8°F | Resp 18 | Ht 62.0 in | Wt 186.0 lb

## 2012-10-19 DIAGNOSIS — R911 Solitary pulmonary nodule: Secondary | ICD-10-CM | POA: Insufficient documentation

## 2012-10-19 DIAGNOSIS — M359 Systemic involvement of connective tissue, unspecified: Secondary | ICD-10-CM

## 2012-10-19 DIAGNOSIS — K922 Gastrointestinal hemorrhage, unspecified: Secondary | ICD-10-CM | POA: Insufficient documentation

## 2012-10-19 DIAGNOSIS — R05 Cough: Secondary | ICD-10-CM | POA: Insufficient documentation

## 2012-10-19 DIAGNOSIS — R059 Cough, unspecified: Secondary | ICD-10-CM | POA: Insufficient documentation

## 2012-10-19 DIAGNOSIS — M0500 Felty's syndrome, unspecified site: Secondary | ICD-10-CM

## 2012-10-19 DIAGNOSIS — D708 Other neutropenia: Secondary | ICD-10-CM | POA: Insufficient documentation

## 2012-10-19 DIAGNOSIS — D649 Anemia, unspecified: Secondary | ICD-10-CM | POA: Insufficient documentation

## 2012-10-19 DIAGNOSIS — M949 Disorder of cartilage, unspecified: Secondary | ICD-10-CM

## 2012-10-19 DIAGNOSIS — D709 Neutropenia, unspecified: Secondary | ICD-10-CM

## 2012-10-19 DIAGNOSIS — J9819 Other pulmonary collapse: Secondary | ICD-10-CM | POA: Insufficient documentation

## 2012-10-19 DIAGNOSIS — K7689 Other specified diseases of liver: Secondary | ICD-10-CM | POA: Insufficient documentation

## 2012-10-19 LAB — CBC WITH DIFFERENTIAL/PLATELET
Basophils Absolute: 0 10*3/uL (ref 0.0–0.1)
Eosinophils Absolute: 0 10*3/uL (ref 0.0–0.5)
HGB: 12.3 g/dL (ref 11.6–15.9)
MCV: 88 fL (ref 79.5–101.0)
NEUT#: 0.1 10*3/uL — CL (ref 1.5–6.5)
RDW: 15 % — ABNORMAL HIGH (ref 11.2–14.5)
lymph#: 1.2 10*3/uL (ref 0.9–3.3)

## 2012-10-19 LAB — COMPREHENSIVE METABOLIC PANEL (CC13)
Albumin: 3.4 g/dL — ABNORMAL LOW (ref 3.5–5.0)
BUN: 8.4 mg/dL (ref 7.0–26.0)
Calcium: 9.2 mg/dL (ref 8.4–10.4)
Chloride: 99 mEq/L (ref 98–107)
Glucose: 143 mg/dl — ABNORMAL HIGH (ref 70–99)
Potassium: 4 mEq/L (ref 3.5–5.1)

## 2012-10-19 MED ORDER — LEVOFLOXACIN 500 MG PO TABS: 500.0000 mg | ORAL_TABLET | Freq: Every day | ORAL | Status: DC

## 2012-10-19 NOTE — Telephone Encounter (Signed)
gv and pritned appt sched and avs for pt for Sept...sent pt back to the lab

## 2012-10-19 NOTE — Progress Notes (Signed)
CC:   [name], M.D. [name], M.D. [name]. Erika Dice, MD,FACG Erika Shan, MD Erika Deeds, MD [name], M.D.  PROBLEM LIST:  1. 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 Bone marrow was carried out on 06/19/2000 and  again on 02/16/2003. There were no abnormalities noted. The bone  marrows were negative.  2. History of rheumatoid arthritis with positive rheumatoid factor,  anti neutrophil antibodies with diagnosis going back to 1986. 3. Fibromyalgia with date of onset 1993.  4. History of GI bleeding in the fall of 2012 with negative endoscopy  and colonoscopy by Dr. Melvia Heaps on 04/22/2011. Stools on  05/15/2011 were negative x5. The patient denies any signs of GI  bleeding at the present time.  5. Right middle lobe consolidation noted in September 2012.  CT scan     of the chest without IV contrast on 10/15/2011 showed improvement     in the posterior right middle lobe consolidation, felt to represent     resolving atelectasis with underlying scarring from prior     Infection. CT scan of the chest without IV contrast on 10/19/2012 showed     posterior right middle lobe atelectasis/collapse similar to the     prior study of 10/15/2011. 6. Osteopenia.  7. History of porphyria cutanea tarda diagnosed in 1992.  8. Left total knee arthroplasty by Dr. Ollen Knox on 06/19/2008.  9. Right apical lung nodule first noted on 01/13/2011 and stable as of CT scan from 10/15/2011.  10. Chronic pain, felt to be due to fibromyalgia. The patient receives care  at the pain clinic.  11. Abrasions of the left leg secondary to trauma in late August 2013 and  early September 2013 treated with doxycycline to cover growth of staph  and strep.    MEDICATIONS:  Reviewed and recorded. Current Outpatient Prescriptions  Medication Sig Dispense Refill  . Alum & Mag Hydroxide-Simeth (MAGIC MOUTHWASH W/LIDOCAINE) SOLN Take 5 mLs by  mouth 3 (three) times daily as needed.  240 mL  0  . Ascorbic Acid (VITAMIN C) 1000 MG tablet Take 1,000 mg by mouth daily.      . calcium carbonate (CALCIUM 600) 600 MG TABS Take 600 mg by mouth 2 (two) times daily with a meal.        . Cholecalciferol (VITAMIN D) 1000 UNITS capsule Take 1,000 Units by mouth daily.        . cyclobenzaprine (FLEXERIL) 10 MG tablet Take 1 tablet (10 mg total) by mouth 3 (three) times daily as needed for muscle spasms.  90 tablet  2  . diclofenac sodium (VOLTAREN) 1 % GEL Apply 2 g topically 4 (four) times daily.  3 Tube  2  . DULoxetine (CYMBALTA) 60 MG capsule TAKE ONE CAPSULE BY MOUTH TWICE DAILY  60 capsule  3  . fentaNYL (DURAGESIC - DOSED MCG/HR) 50 MCG/HR Place 1 patch (50 mcg total) onto the skin every 3 (three) days.  10 patch  0  . HYDROcodone-acetaminophen (NORCO) 7.5-325 MG per tablet Take 1 tablet by mouth every 6 (six) hours as needed.  120 tablet  0  . LYRICA 200 MG capsule TAKE ONE CAPSULE BY MOUTH TWICE DAILY  60 capsule  2  . Melatonin 10 MG CAPS Take 1 tablet by mouth at bedtime.      . methotrexate (RHEUMATREX) 2.5 MG tablet       . nortriptyline (PAMELOR) 50 MG capsule TAKE TWO CAPSULES BY MOUTH AT  BEDTIME  60 capsule  0  . omeprazole (PRILOSEC) 20 MG capsule Take 20 mg by mouth 2 (two) times daily.       . predniSONE (DELTASONE) 10 MG tablet TAKE DAILY BY MOUTH AS DIRECTED BY PHYSICIAN.  60 tablet  4  . Probiotic Product (PROBIOTIC FORMULA) CAPS Take 1 capsule by mouth daily.        . vitamin B-12 (CYANOCOBALAMIN) 1000 MCG tablet Take 1,000 mcg by mouth daily.        . folic acid (FOLVITE) 1 MG tablet Take 1 mg by mouth daily.      Marland Kitchen levofloxacin (LEVAQUIN) 500 MG tablet Take 1 tablet (500 mg total) by mouth daily.  7 tablet  0  . [DISCONTINUED] POTASSIUM PO Take 20 mEq by mouth 2 (two) times daily.       No current facility-administered medications for this visit.    SMOKING HISTORY:  Patient is a former cigarette smoker.  She  stopped smoking in October 2010.   HISTORY:  I am seeing [name] today for followup of her autoimmune neutropenia felt to be most likely due to Felty's syndrome in association with rheumatoid arthritis.  [name] was last seen by [place] on 06/21/2012 and prior to that on 03/01/2012.  It will be recalled that the patient had been on Neupogen back in late 2012 until mid March of 2013.  The patient did respond to this favorably.  After the Neupogen was stopped, her white count current to baseline low levels.  The patient had been doing fairly well without any problems.  She is here today with what at least is acute bronchitis and a fever this morning of 101.5, not associated with any chills or rigors.  The patient apparently had an episode of bronchitis a month ago, was running a temperature of close to 103, treated with doxycycline as an outpatient.  The patient's symptoms appeared to resolve but about a week ago she started having what seemed like a simple cold with a sore throat, nasal drip, and cough.  Symptoms apparently have gotten worse.  She is having more cough and wheezing, although she denies any shortness of breath.  She was running a low-grade fever last night but 101.5 this morning.  She is not on antibiotics at the present time.  As stated, she denies chills and rigors as well as dyspnea.  PHYSICAL EXAMINATION:  The patient does not look acutely ill, although she has a congested cough.  There is no audible wheezing.  O2 saturation on room air, however, was 91%, whereas in the past her baseline has been 97%.  She does not appear short of breath.  Weight is 186 pounds. Height 5 feet 2 inches, body surface area 1.92 sq m.  Blood pressure 116/74. Other vital signs are normal.  Temperature currently is 98.8 orally. There is no scleral icterus.  Mouth and pharynx are benign.  There is no peripheral adenopathy palpable.  Lungs:  Inspiratory and expiratory rhonchi with  expiratory wheezing diffusely and bilaterally.  Cardiac: Regular rhythm without murmur or rub.  The patient does not have a Port- A-Cath or central catheter.  Abdomen:  Obese, nontender, with no organomegaly or masses palpable.  Extremities:  Right leg shows scarring secondary to trauma.  No edema, calf tenderness, or inflammation.  The patient has some purpura over the arms.  She is on prednisone 10 mg daily.  Neurologic:  Normal.  LABORATORY DATA:  White count 2.0, ANC 0.1, hemoglobin  12.3, hematocrit 36.1, platelets 134,000.  Chemistries today are normal except for glucose of 143 and an albumin of 3.4, essentially normal.  Liver function tests, including LDH, were normal at 171.  IMAGING STUDIES:  1. CT scan of the chest without IV contrast on 05/16/2011  showed slight improvement in the right middle lobe consolidation  containing air bronchograms. No endobronchial lesion was identified.  It was suggested that continued radiographic follow up be carried out  to ensure resolution. I reviewed this CT scan as well as the previous  scan of 02/28/2011 with the radiologist.  2. Screening bilateral mammogram from 07/22/2011 was negative.  3. CT scan of the chest without IV contrast from 10/15/2011 showed  further improvement in the posterior right middle lobe  consolidation with air bronchograms. This is felt to represent  resolving atelectasis with underlying scarring from prior  infections. No endobronchial lesion was seen. There is also a 4  mm right apical lung nodule that apparently is unchanged from  02/24/2011 and also 01/13/2011. We are planning to check  another CT scan of the chest in May of 2014. 4. CT scan of the chest without IV contrast on 10/19/2012 showed posterior right middle lobe atelectasis/collapse similar to the prior study, likely post infectious/inflammatory.  There was a stable 4 mm right upper lobe pulmonary nodule, felt to be most likely  benign.   IMPRESSION/PLAN:  [name] appears to have at least acute bronchitis associated with a fever this morning of 101.5.  Also some degree of hypoxia with an oxygen saturation of 91%.  Surprisingly, she does not look septic or toxic and appears to be in no acute distress other than her congested cough.  We will, however, go ahead and get blood cultures x2.  We are also going to give her a prescription for Levaquin 500 mg to take daily for the next 7 days.  The patient has an appointment to see Dr. Thea Silversmith, her primary physician, in the next couple of days.  I urged the patient to monitor her fever and clinical symptoms closely, and if she feels that things are getting worse, either with continued fever, shortness of breath, or other symptoms, she either may need to go to the emergency room or call Dr. Thea Silversmith for an earlier appointment. As stated, we are giving her a prescription for Levaquin.  The patient was also told, as she has in the past, about the importance of reporting any significant fever, i.e., anything much over 100.5 to her physicians, given the fact that she is neutropenic.  In the past, she has tolerated her neutropenia fairly well without sepsis.  I should mention that Dr. Dierdre Forth, her rheumatologist, did place the patient on methotrexate about a week ago.  The patient is taking 15 mg daily by mouth.  Dr. Dierdre Forth and I also spoke about a week ago about his hopes that methotrexate might improve her blood counts, and that methotrexate was treatment for the Felty's syndrome.  Dr. Dierdre Forth will be checking the CBCs monthly.  We will plan to see [name] again in about 4 months, at which time we will check CBC and chemistries.    ______________________________ Samul Dada, M.D. DSM/MEDQ  D:  10/19/2012  T:  10/19/2012  Job:  [id number]

## 2012-10-19 NOTE — Progress Notes (Signed)
This office note has been dictated.  #[id number]

## 2012-10-25 LAB — CULTURE, BLOOD (SINGLE)

## 2012-10-25 NOTE — Progress Notes (Signed)
Quick Note:  Please notify patient and call/fax these results to patient's doctors. ______ 

## 2012-10-26 ENCOUNTER — Telehealth: Payer: Self-pay | Admitting: Medical Oncology

## 2012-10-26 NOTE — Telephone Encounter (Signed)
I called pt to let her know that her blood cultures were negative. She states she is feeling much better and she finished her antibiotics today. She has not had any more fever. She will cal if any problems.

## 2012-10-29 ENCOUNTER — Other Ambulatory Visit: Payer: Self-pay | Admitting: Physical Medicine & Rehabilitation

## 2012-11-18 ENCOUNTER — Encounter: Payer: Self-pay | Admitting: Physical Medicine and Rehabilitation

## 2012-11-18 ENCOUNTER — Encounter
Payer: Medicare Other | Attending: Physical Medicine and Rehabilitation | Admitting: Physical Medicine and Rehabilitation

## 2012-11-18 VITALS — BP 123/74 | HR 88 | Resp 14 | Ht 62.5 in | Wt 184.0 lb

## 2012-11-18 DIAGNOSIS — S81009A Unspecified open wound, unspecified knee, initial encounter: Secondary | ICD-10-CM | POA: Insufficient documentation

## 2012-11-18 DIAGNOSIS — G8929 Other chronic pain: Secondary | ICD-10-CM | POA: Insufficient documentation

## 2012-11-18 DIAGNOSIS — M0500 Felty's syndrome, unspecified site: Secondary | ICD-10-CM | POA: Insufficient documentation

## 2012-11-18 DIAGNOSIS — M069 Rheumatoid arthritis, unspecified: Secondary | ICD-10-CM | POA: Insufficient documentation

## 2012-11-18 DIAGNOSIS — S81809A Unspecified open wound, unspecified lower leg, initial encounter: Secondary | ICD-10-CM | POA: Insufficient documentation

## 2012-11-18 DIAGNOSIS — R222 Localized swelling, mass and lump, trunk: Secondary | ICD-10-CM | POA: Insufficient documentation

## 2012-11-18 DIAGNOSIS — X58XXXA Exposure to other specified factors, initial encounter: Secondary | ICD-10-CM | POA: Insufficient documentation

## 2012-11-18 DIAGNOSIS — IMO0001 Reserved for inherently not codable concepts without codable children: Secondary | ICD-10-CM | POA: Insufficient documentation

## 2012-11-18 MED ORDER — FENTANYL 25 MCG/HR TD PT72: 1.0000 | MEDICATED_PATCH | TRANSDERMAL | Status: DC

## 2012-11-18 MED ORDER — HYDROCODONE-ACETAMINOPHEN 7.5-325 MG PO TABS: 1.0000 | ORAL_TABLET | Freq: Four times a day (QID) | ORAL | Status: DC | PRN

## 2012-11-18 NOTE — Patient Instructions (Signed)
Continue with your exercising in the pool

## 2012-11-18 NOTE — Progress Notes (Signed)
Subjective:    Patient ID: [name], female    DOB: 12/20/1950, 62 y.o.   MRN: [id number]  HPI The patient is complaining about chronic pain from her Fibromyalgia and RA. She complain about joint and muscle pain. Her increased pain at the last visit has improved after I prescribed her the Flexeril and Voltaren gel.  The patient states, that she wants to decrease her Fentanyl, and maybe wants to get off it, she states, that she sweats more than usual with this medication.  The patient states, that her husband has been diagnosed with Alzheimers, he is doing considerably well at this time.  Pain Inventory Average Pain 6 Pain Right Now 6 My pain is aching  In the last 24 hours, has pain interfered with the following? General activity 5 Relation with others 5 Enjoyment of life 5 What TIME of day is your pain at its worst? morning Sleep (in general) Fair  Pain is worse with: walking Pain improves with: rest, medication and injections Relief from Meds: 4  Mobility walk without assistance how many minutes can you walk? 15 ability to climb steps?  yes do you drive?  yes transfers alone Do you have any goals in this area?  no  Function disabled: date disabled 2009 I need assistance with the following:  meal prep, household duties and shopping Do you have any goals in this area?  no  Neuro/Psych numbness trouble walking spasms depression anxiety  Prior Studies CT/MRI  Physicians involved in your care Any changes since last visit?  no   Family History  Problem Relation Age of Onset  . Clotting disorder Mother     Coumadin  . Allergies Mother   . Heart disease Mother   . Stroke Father   . Heart disease Father   . Alzheimer's disease Father    History   Social History  . Marital Status: Married    Spouse Name: N/A    Number of Children: 1  . Years of Education: N/A   Occupational History  . disability    Social History Main Topics  . Smoking  status: Former Smoker -- 0.20 packs/day for 40 years    Types: Cigarettes    Quit date: 12/16/2011  . Smokeless tobacco: Never Used  . Alcohol Use: Yes     Comment: social  . Drug Use: No  . Sexually Active: None   Other Topics Concern  . None   Social History Narrative  . None   Past Surgical History  Procedure Laterality Date  . Total abdominal hysterectomy    . Tubal ligation    . Appendectomy    . Total knee arthroplasty      left  . Rotator cuff repair    . Foot fracture surgery    . Synovectomy of l thumb    . Fusion of r wrist    . Upper palate surgery for tmj syndrome    . Replacement total knee      left   Past Medical History  Diagnosis Date  . Diverticulosis   . Fibromyalgia   . Anemia   . Rheumatoid arthritis(714.0)   . Anxiety   . Blood transfusion   . GERD (gastroesophageal reflux disease)   . Myalgia   . Rheumatoid arthritis(714.0)   . Depression   . Joint effusion   . Calcifying tendinitis of shoulder   . Myalgia and myositis    BP 123/74  Pulse 88  Resp 14  Ht  5' 2.5" (1.588 m)  Wt 184 lb (83.462 kg)  BMI 33.1 kg/m2  SpO2 92%     Review of Systems  Musculoskeletal: Positive for gait problem.  Neurological: Positive for numbness.  Psychiatric/Behavioral: Positive for dysphoric mood. The patient is nervous/anxious.        Objective:   Physical Exam Constitutional: She is oriented to person, place, and time. She appears well-developed and well-nourished.  HENT:  Head: Normocephalic.  Neck: Normal range of motion.  Musculoskeletal: She exhibits tenderness.  Neurological: She is alert and oriented to person, place, and time.  Skin: Skin is warm and dry.  Psychiatric: She has a normal mood and affect.  Symmetric normal motor tone is noted throughout. Normal muscle bulk, except atrophy on her hands bilateral. Muscle testing reveals 5/5 muscle strength of the upper extremity, except wrist and finger muscles, and 5/5 of the lower  extremity. Full range of motion in upper and lower extremities, except shoulders and wrist/ fingers bilateral. ROM of spine is restricted. Fine motor movements are restricted in both hands.  DTR in the upper and lower extremity are present and symmetric 2+. No clonus is noted.  Patient arises from chair without difficulty. Narrow based gait with normal arm swing bilateral .  Open wound on left calf, finally healed.        Assessment & Plan:  This a 62 year old female with  1. Rheumatid arthritis,  Voltaren gel, to use for her increased joint pain  2. Fibromyalgia ,  Flexeril, she can take it tid prn muscle spasms/pain  3. Felty's syndrome  4. Mass in lung  5. Open wound on left calf,reinfected,treated by wound care center, has finally healed, patient has restarted her aquatic exercises.  Plan :  Patient wants to try a lower dose of the Fentanyl patch, because she developed diaphoresis, might change her medication , if the lower dose is not sufficient, or still causes diaphoresis. Continue with other medication.   Continue with your exercise program as pain permits.. Advised patient to organize some time for herself to relax and get strength to be able to care for her husband.  Follow up in 1 month.

## 2012-12-17 ENCOUNTER — Encounter: Payer: Self-pay | Admitting: Physical Medicine and Rehabilitation

## 2012-12-17 ENCOUNTER — Encounter
Payer: Medicare Other | Attending: Physical Medicine and Rehabilitation | Admitting: Physical Medicine and Rehabilitation

## 2012-12-17 VITALS — BP 108/67 | HR 99 | Resp 14 | Ht 62.0 in | Wt 183.0 lb

## 2012-12-17 DIAGNOSIS — M797 Fibromyalgia: Secondary | ICD-10-CM

## 2012-12-17 DIAGNOSIS — X58XXXA Exposure to other specified factors, initial encounter: Secondary | ICD-10-CM | POA: Insufficient documentation

## 2012-12-17 DIAGNOSIS — G8929 Other chronic pain: Secondary | ICD-10-CM | POA: Insufficient documentation

## 2012-12-17 DIAGNOSIS — IMO0001 Reserved for inherently not codable concepts without codable children: Secondary | ICD-10-CM | POA: Insufficient documentation

## 2012-12-17 DIAGNOSIS — S81009A Unspecified open wound, unspecified knee, initial encounter: Secondary | ICD-10-CM | POA: Insufficient documentation

## 2012-12-17 DIAGNOSIS — R222 Localized swelling, mass and lump, trunk: Secondary | ICD-10-CM | POA: Insufficient documentation

## 2012-12-17 DIAGNOSIS — M069 Rheumatoid arthritis, unspecified: Secondary | ICD-10-CM | POA: Insufficient documentation

## 2012-12-17 DIAGNOSIS — M0500 Felty's syndrome, unspecified site: Secondary | ICD-10-CM | POA: Insufficient documentation

## 2012-12-17 MED ORDER — HYDROCODONE-ACETAMINOPHEN 7.5-325 MG PO TABS: 1.0000 | ORAL_TABLET | ORAL | Status: DC | PRN

## 2012-12-17 MED ORDER — FENTANYL 12 MCG/HR TD PT72: 1.0000 | MEDICATED_PATCH | TRANSDERMAL | Status: DC

## 2012-12-17 NOTE — Patient Instructions (Signed)
You can try Arnica cream for your hands or other joint and muscle pain.

## 2012-12-17 NOTE — Progress Notes (Signed)
Subjective:    Patient ID: [name], female    DOB: 01-Feb-1951, 62 y.o.   MRN: [id number]  HPI The patient is complaining about chronic pain from her Fibromyalgia and RA. She complain about joint and muscle pain. Her increased pain at the last visit has improved after I prescribed her the Flexeril and Voltaren gel.  The patient states, that she wants to decrease her Fentanyl, and maybe wants to get off it, she states, that she sweats more than usual with this medication.We decreased it to at the last visit, today she states, that she could handle the lower dose, and would like to decrease it further. She also states that she is sweating less.  The patient states, that her husband has been diagnosed with Alzheimers, he is doing considerably well at this time.  Pain Inventory Average Pain 8 Pain Right Now 8 My pain is stabbing and aching  In the last 24 hours, has pain interfered with the following? General activity 7 Relation with others 4 Enjoyment of life 5 What TIME of day is your pain at its worst? morning Sleep (in general) Fair  Pain is worse with: walking and some activites Pain improves with: rest, medication and injections Relief from Meds: 5  Mobility walk without assistance how many minutes can you walk? 15 ability to climb steps?  yes do you drive?  yes transfers alone Do you have any goals in this area?  no  Function disabled: date disabled 09 I need assistance with the following:  dressing, meal prep, household duties and shopping Do you have any goals in this area?  no  Neuro/Psych trouble walking dizziness depression anxiety  Prior Studies Any changes since last visit?  no  Physicians involved in your care Any changes since last visit?  no   Family History  Problem Relation Age of Onset  . Clotting disorder Mother     Coumadin  . Allergies Mother   . Heart disease Mother   . Stroke Father   . Heart disease Father   .  Alzheimer's disease Father    History   Social History  . Marital Status: Married    Spouse Name: N/A    Number of Children: 1  . Years of Education: N/A   Occupational History  . disability    Social History Main Topics  . Smoking status: Former Smoker -- 0.20 packs/day for 40 years    Types: Cigarettes    Quit date: 12/16/2011  . Smokeless tobacco: Never Used  . Alcohol Use: Yes     Comment: social  . Drug Use: No  . Sexually Active: None   Other Topics Concern  . None   Social History Narrative  . None   Past Surgical History  Procedure Laterality Date  . Total abdominal hysterectomy    . Tubal ligation    . Appendectomy    . Total knee arthroplasty      left  . Rotator cuff repair    . Foot fracture surgery    . Synovectomy of l thumb    . Fusion of r wrist    . Upper palate surgery for tmj syndrome    . Replacement total knee      left   Past Medical History  Diagnosis Date  . Diverticulosis   . Fibromyalgia   . Anemia   . Rheumatoid arthritis(714.0)   . Anxiety   . Blood transfusion   . GERD (gastroesophageal reflux disease)   .  Myalgia   . Rheumatoid arthritis(714.0)   . Depression   . Joint effusion   . Calcifying tendinitis of shoulder   . Myalgia and myositis    BP 108/67  Pulse 99  Resp 14  Ht 5\' 2"  (1.575 m)  Wt 183 lb (83.008 kg)  BMI 33.46 kg/m2  SpO2 96%     Review of Systems  Musculoskeletal: Positive for gait problem.  Neurological: Positive for dizziness.  Psychiatric/Behavioral: Positive for dysphoric mood. The patient is nervous/anxious.   All other systems reviewed and are negative.       Objective:   Physical Exam Constitutional: She is oriented to person, place, and time. She appears well-developed and well-nourished.  HENT:  Head: Normocephalic.  Neck: Normal range of motion.  Musculoskeletal: She exhibits tenderness.  Neurological: She is alert and oriented to person, place, and time.  Skin: Skin is warm  and dry.  Psychiatric: She has a normal mood and affect.  Symmetric normal motor tone is noted throughout. Normal muscle bulk, except atrophy on her hands bilateral. Muscle testing reveals 5/5 muscle strength of the upper extremity, except wrist and finger muscles, and 5/5 of the lower extremity. Full range of motion in upper and lower extremities, except shoulders and wrist/ fingers bilateral. ROM of spine is restricted. Fine motor movements are restricted in both hands.  DTR in the upper and lower extremity are present and symmetric 2+. No clonus is noted.  Patient arises from chair without difficulty. Narrow based gait with normal arm swing bilateral .  Open wound on left calf, finally healed.        Assessment & Plan:  This a 62 year old female with  1. Rheumatid arthritis, Voltaren gel, to use for her increased joint pain  2. Fibromyalgia , Flexeril, she can take it tid prn muscle spasms/pain  3. Felty's syndrome  4. Mass in lung  5. Open wound on left calf,reinfected,treated by wound care center, has finally healed, patient has restarted her aquatic exercises.  Plan :  Patient wants to try a lower dose of the Fentanyl patch, because she developed diaphoresis, might change her medication , if the lower dose is not sufficient, or still causes diaphoresis. Prescribed 12.5 mcg patches today, increased the Hydrocodone 7.5 to q 4 hrs, # 180, max 6 per day. Continue with other medication.  Continue with your exercise program as pain permits.. Advised patient to organize some time for herself to relax and get strength to be able to care for her husband.  Follow up in 1 month.

## 2012-12-21 ENCOUNTER — Other Ambulatory Visit: Payer: Self-pay | Admitting: Physical Medicine and Rehabilitation

## 2012-12-21 NOTE — Telephone Encounter (Signed)
lyrica filled per pharmacy request. Next appt 01/14/13

## 2012-12-30 ENCOUNTER — Other Ambulatory Visit: Payer: Self-pay

## 2012-12-30 MED ORDER — DULOXETINE HCL 60 MG PO CPEP: ORAL_CAPSULE | ORAL | Status: DC

## 2013-01-07 ENCOUNTER — Other Ambulatory Visit: Payer: Self-pay | Admitting: *Deleted

## 2013-01-07 MED ORDER — DULOXETINE HCL 60 MG PO CPEP: ORAL_CAPSULE | ORAL | Status: DC

## 2013-01-07 NOTE — Telephone Encounter (Signed)
Requesting refill for cymbalta.  Original on 12/30/12 went to [facility].  Requested [facility]. Sent

## 2013-01-14 ENCOUNTER — Encounter: Payer: Self-pay | Admitting: Physical Medicine and Rehabilitation

## 2013-01-14 ENCOUNTER — Encounter
Payer: Medicare Other | Attending: Physical Medicine and Rehabilitation | Admitting: Physical Medicine and Rehabilitation

## 2013-01-14 VITALS — BP 115/72 | HR 104 | Resp 14 | Ht 62.0 in | Wt 178.0 lb

## 2013-01-14 DIAGNOSIS — Z87891 Personal history of nicotine dependence: Secondary | ICD-10-CM | POA: Insufficient documentation

## 2013-01-14 DIAGNOSIS — R222 Localized swelling, mass and lump, trunk: Secondary | ICD-10-CM | POA: Insufficient documentation

## 2013-01-14 DIAGNOSIS — R209 Unspecified disturbances of skin sensation: Secondary | ICD-10-CM | POA: Insufficient documentation

## 2013-01-14 DIAGNOSIS — F411 Generalized anxiety disorder: Secondary | ICD-10-CM | POA: Insufficient documentation

## 2013-01-14 DIAGNOSIS — M069 Rheumatoid arthritis, unspecified: Secondary | ICD-10-CM

## 2013-01-14 DIAGNOSIS — IMO0001 Reserved for inherently not codable concepts without codable children: Secondary | ICD-10-CM

## 2013-01-14 DIAGNOSIS — M0500 Felty's syndrome, unspecified site: Secondary | ICD-10-CM | POA: Insufficient documentation

## 2013-01-14 DIAGNOSIS — Z79899 Other long term (current) drug therapy: Secondary | ICD-10-CM | POA: Insufficient documentation

## 2013-01-14 MED ORDER — HYDROCODONE-ACETAMINOPHEN 7.5-325 MG PO TABS: 1.0000 | ORAL_TABLET | ORAL | Status: DC | PRN

## 2013-01-14 MED ORDER — CYCLOBENZAPRINE HCL 10 MG PO TABS: 10.0000 mg | ORAL_TABLET | Freq: Three times a day (TID) | ORAL | Status: DC | PRN

## 2013-01-14 NOTE — Progress Notes (Signed)
Subjective:    Patient ID: [name], female    DOB: Apr 01, 1951, 62 y.o.   MRN: [id number]  HPI The patient is complaining about chronic pain from her Fibromyalgia and RA. She complain about joint and muscle pain. Her increased pain at the last visit has improved after I prescribed her the Flexeril and Voltaren gel.  The patient states, that she wants to decrease her Fentanyl, and maybe wants to get off it, she states, that she sweats more than usual with this medication.We decreased it to 12. at the last visit, today she states, that she could handle the lower dose, and would like to d/c the fentanyl. She also complains about numbness in her entire left leg. The patient states, that her husband has been diagnosed with Alzheimers, he is doing considerably well at this time. Is grieving , because her dad died last week.  Pain Inventory Average Pain 7 Pain Right Now 7 My pain is aching  In the last 24 hours, has pain interfered with the following? General activity 6 Relation with others 7 Enjoyment of life 7 What TIME of day is your pain at its worst? morning Sleep (in general) Fair  Pain is worse with: walking and some activites Pain improves with: rest, medication and injections Relief from Meds: 7  Mobility walk without assistance how many minutes can you walk? 15 ability to climb steps?  yes do you drive?  yes transfers alone Do you have any goals in this area?  no  Function disabled: date disabled 2009 I need assistance with the following:  dressing, meal prep, household duties and shopping Do you have any goals in this area?  no  Neuro/Psych depression anxiety  Prior Studies Any changes since last visit?  no  Physicians involved in your care Any changes since last visit?  no   Family History  Problem Relation Age of Onset  . Clotting disorder Mother     Coumadin  . Allergies Mother   . Heart disease Mother   . Stroke Father   . Heart disease  Father   . Alzheimer's disease Father    History   Social History  . Marital Status: Married    Spouse Name: N/A    Number of Children: 1  . Years of Education: N/A   Occupational History  . disability    Social History Main Topics  . Smoking status: Former Smoker -- 0.20 packs/day for 40 years    Types: Cigarettes    Quit date: 12/16/2011  . Smokeless tobacco: Never Used  . Alcohol Use: Yes     Comment: social  . Drug Use: No  . Sexually Active: None   Other Topics Concern  . None   Social History Narrative  . None   Past Surgical History  Procedure Laterality Date  . Total abdominal hysterectomy    . Tubal ligation    . Appendectomy    . Total knee arthroplasty      left  . Rotator cuff repair    . Foot fracture surgery    . Synovectomy of l thumb    . Fusion of r wrist    . Upper palate surgery for tmj syndrome    . Replacement total knee      left   Past Medical History  Diagnosis Date  . Diverticulosis   . Fibromyalgia   . Anemia   . Rheumatoid arthritis(714.0)   . Anxiety   . Blood transfusion   .  GERD (gastroesophageal reflux disease)   . Myalgia   . Rheumatoid arthritis(714.0)   . Depression   . Joint effusion   . Calcifying tendinitis of shoulder   . Myalgia and myositis    BP 115/72  Pulse 104  Resp 14  Ht 5\' 2"  (1.575 m)  Wt 178 lb (80.74 kg)  BMI 32.55 kg/m2  SpO2 98%     Review of Systems  Psychiatric/Behavioral: Positive for dysphoric mood. The patient is nervous/anxious.   All other systems reviewed and are negative.       Objective:   Physical Exam  Constitutional: She is oriented to person, place, and time. She appears well-developed and well-nourished.  HENT:  Head: Normocephalic.  Neck: Normal range of motion.  Musculoskeletal: She exhibits tenderness.  Neurological: She is alert and oriented to person, place, and time.  Skin: Skin is warm and dry. Tibialis posterior pulse and dorsal pedis pulse, very weak on  palpation cold toes on feet bilateral Psychiatric: She has a normal mood and affect.  Symmetric normal motor tone is noted throughout. Normal muscle bulk, except atrophy on her hands bilateral. Muscle testing reveals 5/5 muscle strength of the upper extremity, except wrist and finger muscles, and 5/5 of the lower extremity. Full range of motion in upper and lower extremities, except shoulders and wrist/ fingers bilateral. ROM of spine is restricted. Fine motor movements are restricted in both hands.  DTR in the upper and lower extremity are present and symmetric 2+. No clonus is noted.  Patient arises from chair without difficulty. Narrow based gait with normal arm swing bilateral .  Open wound on left calf, finally healed.       Assessment & Plan:  This a 62 year old female with  1. Rheumatid arthritis, Voltaren gel, to use for her increased joint pain  2. Fibromyalgia , Flexeril, she can take it tid prn muscle spasms/pain  3. Felty's syndrome  4. Mass in lung  5. Open wound on left calf,reinfected,treated by wound care center, has finally healed, patient has restarted her aquatic exercises. 6. Numbness in LLE, most likely circulatory, patient is a former smoker, she did not do her aquatic exercises which she usually does 5 times a week, for 2 weeks, since the last 2 weeks she has noted the numbness. Advised patient to restart her aquatics, and if the numbness does not go away, she should follow up with her PCP for further evaluation  Plan :  Patient wants to d/c the Fentanyl patch, patient might call us if she can not manage without the fentanyl patch, or needs an increase in her dose of Hydrocodone. Continue with the increased the Hydrocodone 7.5 to q 4 hrs, # 180, max 6 per day.  Continue with other medication.  Continue with your aquatic exercise program as pain permits.  Advised patient to organize some time for herself to relax and get strength to be able to care for her husband.   Follow up in 3 month.

## 2013-01-14 NOTE — Patient Instructions (Signed)
Restart your aquatic exercises , if the numbness in your entire leg does not get better in 2 weeks of exercising in the pool regularly, follow up with your PCP

## 2013-01-22 ENCOUNTER — Other Ambulatory Visit: Payer: Self-pay | Admitting: Physical Medicine and Rehabilitation

## 2013-02-15 ENCOUNTER — Telehealth: Payer: Self-pay | Admitting: Oncology

## 2013-02-15 NOTE — Telephone Encounter (Signed)
[name] today to confirm Ottis Stain will receive her as a pt and that 9/12 appts are cx'd. Pt aware I will call back re f/u w/JG.

## 2013-02-17 ENCOUNTER — Telehealth: Payer: Self-pay | Admitting: Oncology

## 2013-02-17 NOTE — Telephone Encounter (Signed)
[name] re appt for lb/JG 10/6 @ 10am. appt was initally scheduled for 9/22 per JG which is a call day then moved to 10/6 per JG.

## 2013-02-18 ENCOUNTER — Other Ambulatory Visit: Payer: Medicare Other | Admitting: Lab

## 2013-02-18 ENCOUNTER — Ambulatory Visit: Payer: Medicare Other

## 2013-02-23 ENCOUNTER — Other Ambulatory Visit: Payer: Self-pay | Admitting: Physical Medicine & Rehabilitation

## 2013-02-28 ENCOUNTER — Ambulatory Visit: Payer: Medicare Other | Admitting: Oncology

## 2013-03-01 ENCOUNTER — Ambulatory Visit: Payer: Medicare Other | Admitting: Oncology

## 2013-03-02 ENCOUNTER — Telehealth: Payer: Self-pay

## 2013-03-02 NOTE — Telephone Encounter (Signed)
Patient needs a form filled out to get some financial help to pay for medication. She wants to know who will need to fill the form out and the process.

## 2013-03-03 NOTE — Telephone Encounter (Signed)
Spoke with patient.  She will receive forms from [employer] in the coming week and, once completed, will bring them to our office to send.  Could you please print 90 day prescriptions, with 3 refills, for Cymbalta and Lyrica?  Thanks.

## 2013-03-04 MED ORDER — PREGABALIN 200 MG PO CAPS: ORAL_CAPSULE | ORAL | Status: DC

## 2013-03-04 MED ORDER — DULOXETINE HCL 60 MG PO CPEP: ORAL_CAPSULE | ORAL | Status: DC

## 2013-03-04 NOTE — Telephone Encounter (Signed)
Cymbalta and lyrica script printed.

## 2013-03-04 NOTE — Addendum Note (Signed)
Addended by: Judd Gaudier on: 03/04/2013 08:04 AM   Modules accepted: Orders

## 2013-03-11 ENCOUNTER — Other Ambulatory Visit: Payer: Self-pay | Admitting: *Deleted

## 2013-03-11 DIAGNOSIS — M0500 Felty's syndrome, unspecified site: Secondary | ICD-10-CM

## 2013-03-14 ENCOUNTER — Ambulatory Visit (HOSPITAL_BASED_OUTPATIENT_CLINIC_OR_DEPARTMENT_OTHER): Payer: Medicare Other | Admitting: Oncology

## 2013-03-14 ENCOUNTER — Telehealth: Payer: Self-pay | Admitting: Oncology

## 2013-03-14 ENCOUNTER — Other Ambulatory Visit (HOSPITAL_BASED_OUTPATIENT_CLINIC_OR_DEPARTMENT_OTHER): Payer: Medicare Other | Admitting: Lab

## 2013-03-14 VITALS — BP 118/76 | HR 88 | Temp 98.0°F | Resp 18 | Ht 62.0 in | Wt 180.2 lb

## 2013-03-14 DIAGNOSIS — M359 Systemic involvement of connective tissue, unspecified: Secondary | ICD-10-CM

## 2013-03-14 DIAGNOSIS — M0500 Felty's syndrome, unspecified site: Secondary | ICD-10-CM

## 2013-03-14 DIAGNOSIS — D709 Neutropenia, unspecified: Secondary | ICD-10-CM

## 2013-03-14 DIAGNOSIS — IMO0001 Reserved for inherently not codable concepts without codable children: Secondary | ICD-10-CM

## 2013-03-14 DIAGNOSIS — Z23 Encounter for immunization: Secondary | ICD-10-CM

## 2013-03-14 DIAGNOSIS — D649 Anemia, unspecified: Secondary | ICD-10-CM

## 2013-03-14 DIAGNOSIS — M069 Rheumatoid arthritis, unspecified: Secondary | ICD-10-CM

## 2013-03-14 DIAGNOSIS — D61818 Other pancytopenia: Secondary | ICD-10-CM

## 2013-03-14 LAB — LACTATE DEHYDROGENASE (CC13): LDH: 165 U/L (ref 125–245)

## 2013-03-14 LAB — COMPREHENSIVE METABOLIC PANEL (CC13)
ALT: 16 U/L (ref 0–55)
BUN: 10.6 mg/dL (ref 7.0–26.0)
CO2: 27 mEq/L (ref 22–29)
Calcium: 9.2 mg/dL (ref 8.4–10.4)
Chloride: 104 mEq/L (ref 98–109)
Creatinine: 0.8 mg/dL (ref 0.6–1.1)
Sodium: 140 mEq/L (ref 136–145)
Total Protein: 7.7 g/dL (ref 6.4–8.3)

## 2013-03-14 LAB — CBC WITH DIFFERENTIAL/PLATELET
Eosinophils Absolute: 0 10*3/uL (ref 0.0–0.5)
HCT: 34.5 % — ABNORMAL LOW (ref 34.8–46.6)
HGB: 11.6 g/dL (ref 11.6–15.9)
LYMPH%: 66.3 % — ABNORMAL HIGH (ref 14.0–49.7)
MONO#: 0.3 10*3/uL (ref 0.1–0.9)
NEUT#: 0 10*3/uL — CL (ref 1.5–6.5)
NEUT%: 4 % — ABNORMAL LOW (ref 38.4–76.8)
Platelets: 123 10*3/uL — ABNORMAL LOW (ref 145–400)
RBC: 3.85 10*6/uL (ref 3.70–5.45)
RDW: 15.4 % — ABNORMAL HIGH (ref 11.2–14.5)
WBC: 1 10*3/uL — ABNORMAL LOW (ref 3.9–10.3)
nRBC: 0 % (ref 0–0)

## 2013-03-14 MED ORDER — INFLUENZA VAC SPLIT QUAD 0.5 ML IM SUSP
0.5000 mL | INTRAMUSCULAR | Status: AC
  Administered 2013-03-14: 0.5 mL via INTRAMUSCULAR
  Filled 2013-03-14: qty 0.5

## 2013-03-14 NOTE — Telephone Encounter (Signed)
s.w. pt and advised on April 2015 appt...pt ok and aware °

## 2013-03-14 NOTE — Progress Notes (Signed)
Hematology and Oncology Follow Up Visit  [name] [id number] 04-16-1951 62 y.o. 03/14/2013 3:54 PM   Principle Diagnosis: Encounter Diagnoses  Name Primary?  . Felty's syndrome   . Neutropenia associated with autoimmune disease Yes  . Rheumatoid arthritis   . Myalgia and myositis, unspecified   . Anemia      Interim History:   Pleasant but rather complicated 62 year old woman I am seeing for the first time today since Dr. Arline Asp retired from the practice. She has long-standing rheumatoid arthritis diagnosed when she was only 62 years old. She's been on multiple immunosuppressive therapies. She is on chronic low-dose prednisone 10 mg daily. She was recently put back on weekly oral methotrexate about 5 months ago due to progressive joint symptoms. She has significant joint deformities of her hands and has had surgical procedures on the left hand. She has had a left total knee replacement. Bilateral rotator cuff replacements. 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 It is felt that she has Felty's syndrome. Ultrasound of the abdomen done January 2011 showed a normal size spleen. Last CT scan of the abdomen and pelvis done 01/21/2011 and spleen also appeared normal on that study. On my exam today which will be detailed below, I can feel the spleen tip in the left upper quadrant. She had a normal bone marrow aspiration and biopsy on 02/16/2003. Peripheral White count was 1800 at that time. Fortunately she has not had any problems with recurrent or serious infections. Despite the granulocytopenia she has a chronic monocytosis and the monocytes may be giving her some protection. She has been followed closely for an atypical pneumonia which occurred in August 2012. She underwent bronchoscopy 01/23/2011. to exclude malignancy and biopsies returned negative.. There was nodular scarring in the right middle lobe.  These changes have improved significantly but there is still some residual scarring on a recent 10/19/2012 study.  Her total white blood counts run between 1600 and 2300. There has been a progressive decline in the neutrophil percent which was over 50% for the first part of 2013 then fell to 25% of the differential in August 2013 then fell abruptly to 2% on 06/21/2012 and has remained in the 2-7% range. There is a trend for fall in her platelet count but this correlates more closely with initiation of methotrexate. Her total white count has also fallen and is 1,000 today with 4% neutrophils, 66% lymphocytes, 28% monocytes, hemoglobin 11.6, and platelet count 123,000.     Medications: reviewed  Allergies:  Allergies  Allergen Reactions  . Penicillins Itching    Review of Systems: Constitutional:   No anorexia, fever, weight loss HEENT no sore throat Respiratory: No cough or dyspnea Cardiovascular: No chest pain or palpitations  Gastrointestinal: No abdominal pain or change in bowel habit. Remote history of peptic ulcer disease when she was a child. Genito-Urinary: No urinary tract symptoms Musculoskeletal: See above Neurologic: No headache or change in vision Skin: No rash. She bruises very easily due to chronic prednisone  Remaining ROS negative.     Physical Exam: Blood pressure 118/76, pulse 88, temperature 98 F (36.7 C), temperature source Oral, resp. rate 18, height 5\' 2"  (1.575 m), weight 180 lb 3.2 oz (81.738 kg), SpO2 100.00%. Wt Readings from Last 3 Encounters:  03/14/13 180 lb 3.2 oz (81.738 kg)  01/14/13 178 lb (80.74 kg)  12/17/12 183 lb (83.008 kg)     General appearance: Well-nourished Caucasian woman HENNT:  Pharynx no erythema, exudate, or ulcer. No thyromegaly Lymph nodes: No cervical, supraclavicular, or axillary adenopathy Breasts: Lungs: Clear to auscultation resonant to percussion Heart: Regular rhythm no murmur Abdomen: Soft, nontender, spleen is  palpable in the left upper quadrant Extremities: No edema, no calf tenderness Musculoskeletal: Swan neck deformities of the fingers GU: Vascular: No carotid bruits, no cyanosis Neurologic: Intermittent paresthesias of her feet Skin: No rash. Scattered ecchymoses. One large linear one on her leg where she probably scratched it. Large defect in the right tibial area from previous laceration and infection.  Lab Results: CBC W/Diff    Component Value Date/Time   WBC 1.0* 03/14/2013 1010   WBC 2.5* 03/02/2011 0325   RBC 3.85 03/14/2013 1010   RBC 3.07* 03/02/2011 0325   RBC 3.11* 03/01/2011 1100   HGB 11.6 03/14/2013 1010   HGB 8.7* 03/02/2011 0325   HCT 34.5* 03/14/2013 1010   HCT 25.7* 03/02/2011 0325   PLT 123* 03/14/2013 1010   PLT 49* 03/02/2011 0325   MCV 89.6 03/14/2013 1010   MCV 83.7 03/02/2011 0325   MCH 30.1 03/14/2013 1010   MCH 28.3 03/02/2011 0325   MCHC 33.6 03/14/2013 1010   MCHC 33.9 03/02/2011 0325   RDW 15.4* 03/14/2013 1010   RDW 20.1* 03/02/2011 0325   LYMPHSABS 0.7* 03/14/2013 1010   LYMPHSABS 2.3 02/28/2011 1553   MONOABS 0.3 03/14/2013 1010   MONOABS 0.1 02/28/2011 1553   EOSABS 0.0 03/14/2013 1010   EOSABS 0.0 02/28/2011 1553   BASOSABS 0.0 03/14/2013 1010   BASOSABS 0.0 02/28/2011 1553     Chemistry      Component Value Date/Time   NA 140 03/14/2013 1011   NA 140 10/16/2011 1338   K 3.8 03/14/2013 1011   K 3.8 10/16/2011 1338   CL 99 10/19/2012 1051   CL 105 10/16/2011 1338   CO2 27 03/14/2013 1011   CO2 26 10/16/2011 1338   BUN 10.6 03/14/2013 1011   BUN 10 10/16/2011 1338   CREATININE 0.8 03/14/2013 1011   CREATININE 0.82 10/16/2011 1338      Component Value Date/Time   CALCIUM 9.2 03/14/2013 1011   CALCIUM 9.3 10/16/2011 1338   ALKPHOS 45 03/14/2013 1011   ALKPHOS 44 10/16/2011 1338   AST 20 03/14/2013 1011   AST 17 10/16/2011 1338   ALT 16 03/14/2013 1011   ALT 19 10/16/2011 1338   BILITOT 0.65 03/14/2013 1011   BILITOT 0.5 10/16/2011 1338       Impression: #1. 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 Unfortunately, I don't think there is much we can offer here. She should take prophylactic antibiotics around any surgical or dental procedures. In the event of any major surgery, I would consider short-term use of Neupogen to stimulate white blood cell production. She is on chronic low-dose steroids. They don't seem to be helping with respect to her white count. She has now been back on methotrexate for about 5 months. If anything, her white count is worse. I would consider a trial of Rituxan in the future if we could get insurance authorization. For now, we will continue to monitor her blood counts periodically.  #2. Felty's syndrome She probably does have this although spleen is only mildly enlarged.  #3. Long-standing rheumatoid arthritis  #4. Nodular pulmonary infiltrate with negative bronchoscopy with biopsy Stable to improved CT scan done May 2014.  #5. Porphyria cutanea tarda by history diagnosed in  1992. She reports no active skin lesions for many years. I doubt the diagnosis is valid.  She was given a influenza vaccine today in our office   CC:. Dr. Shary Decamp; Dr. Alben Deeds   Levert Feinstein, MD 10/6/20143:54 PM

## 2013-03-22 ENCOUNTER — Encounter (INDEPENDENT_AMBULATORY_CARE_PROVIDER_SITE_OTHER): Payer: Self-pay

## 2013-03-22 ENCOUNTER — Encounter: Payer: Self-pay | Admitting: Physical Medicine and Rehabilitation

## 2013-03-22 ENCOUNTER — Encounter
Payer: Medicare Other | Attending: Physical Medicine and Rehabilitation | Admitting: Physical Medicine and Rehabilitation

## 2013-03-22 ENCOUNTER — Other Ambulatory Visit: Payer: Self-pay

## 2013-03-22 VITALS — BP 110/84 | HR 96 | Resp 14 | Ht 62.0 in | Wt 177.0 lb

## 2013-03-22 DIAGNOSIS — R209 Unspecified disturbances of skin sensation: Secondary | ICD-10-CM | POA: Insufficient documentation

## 2013-03-22 DIAGNOSIS — M069 Rheumatoid arthritis, unspecified: Secondary | ICD-10-CM | POA: Insufficient documentation

## 2013-03-22 DIAGNOSIS — Z79899 Other long term (current) drug therapy: Secondary | ICD-10-CM | POA: Insufficient documentation

## 2013-03-22 DIAGNOSIS — Z5181 Encounter for therapeutic drug level monitoring: Secondary | ICD-10-CM

## 2013-03-22 DIAGNOSIS — Z87891 Personal history of nicotine dependence: Secondary | ICD-10-CM | POA: Insufficient documentation

## 2013-03-22 DIAGNOSIS — R222 Localized swelling, mass and lump, trunk: Secondary | ICD-10-CM | POA: Insufficient documentation

## 2013-03-22 DIAGNOSIS — M0500 Felty's syndrome, unspecified site: Secondary | ICD-10-CM | POA: Insufficient documentation

## 2013-03-22 DIAGNOSIS — IMO0001 Reserved for inherently not codable concepts without codable children: Secondary | ICD-10-CM | POA: Insufficient documentation

## 2013-03-22 MED ORDER — DULOXETINE HCL 60 MG PO CPEP: ORAL_CAPSULE | ORAL | Status: DC

## 2013-03-22 MED ORDER — PREGABALIN 200 MG PO CAPS: ORAL_CAPSULE | ORAL | Status: DC

## 2013-03-22 MED ORDER — HYDROCODONE-ACETAMINOPHEN 7.5-325 MG PO TABS: 1.0000 | ORAL_TABLET | ORAL | Status: DC | PRN

## 2013-03-22 NOTE — Addendum Note (Signed)
Addended by: Judd Gaudier on: 03/22/2013 02:49 PM   Modules accepted: Orders

## 2013-03-22 NOTE — Progress Notes (Signed)
Subjective:    Patient ID: [name], female    DOB: 09-17-1950, 62 y.o.   MRN: [id number]  HPI The patient is complaining about chronic pain from her Fibromyalgia and RA. She complain about joint and muscle pain. Her increased pain at the last visit has improved after I prescribed her the Flexeril and Voltaren gel.  The patient states, that she wants to decrease her Fentanyl, and maybe wants to get off it, she states, that she sweats more than usual with this medication.We decreased it to 12. at the last visit, today she states, that she could handle the lower dose, and would like to d/c the fentanyl.  She also complains about numbness in her entire left leg.  The patient states, that her husband has been diagnosed with Alzheimers, he is doing considerably well at this time.  Is grieving , because her dad died last week.  Pain Inventory Average Pain 9 Pain Right Now 8 My pain is stabbing and aching  In the last 24 hours, has pain interfered with the following? General activity 8 Relation with others 8 Enjoyment of life 8 What TIME of day is your pain at its worst? morning Sleep (in general) Fair  Pain is worse with: walking and some activites Pain improves with: rest, medication and injections Relief from Meds: 7  Mobility walk without assistance ability to climb steps?  yes do you drive?  yes Do you have any goals in this area?  no  Function disabled: date disabled 01-16-2008 I need assistance with the following:  dressing, meal prep, household duties and shopping  Neuro/Psych No problems in this area  Prior Studies Any changes since last visit?  no  Physicians involved in your care Any changes since last visit?  no   Family History  Problem Relation Age of Onset  . Clotting disorder Mother     Coumadin  . Allergies Mother   . Heart disease Mother   . Stroke Father   . Heart disease Father   . Alzheimer's disease Father    History   Social  History  . Marital Status: Married    Spouse Name: N/A    Number of Children: 1  . Years of Education: N/A   Occupational History  . disability    Social History Main Topics  . Smoking status: Former Smoker -- 0.20 packs/day for 40 years    Types: Cigarettes    Quit date: 12/16/2011  . Smokeless tobacco: Never Used  . Alcohol Use: Yes     Comment: social  . Drug Use: No  . Sexual Activity: None   Other Topics Concern  . None   Social History Narrative  . None   Past Surgical History  Procedure Laterality Date  . Total abdominal hysterectomy    . Tubal ligation    . Appendectomy    . Total knee arthroplasty      left  . Rotator cuff repair    . Foot fracture surgery    . Synovectomy of l thumb    . Fusion of r wrist    . Upper palate surgery for tmj syndrome    . Replacement total knee      left   Past Medical History  Diagnosis Date  . Diverticulosis   . Fibromyalgia   . Anemia   . Rheumatoid arthritis(714.0)   . Anxiety   . Blood transfusion   . GERD (gastroesophageal reflux disease)   . Myalgia   .  Rheumatoid arthritis(714.0)   . Depression   . Joint effusion   . Calcifying tendinitis of shoulder   . Myalgia and myositis    BP 110/84  Pulse 96  Resp 14  Ht 5\' 2"  (1.575 m)  Wt 177 lb (80.287 kg)  BMI 32.37 kg/m2  SpO2 96%      Review of Systems  Musculoskeletal: Positive for arthralgias and myalgias.  All other systems reviewed and are negative.       Objective:   Physical Exam Constitutional: She is oriented to person, place, and time. She appears well-developed and well-nourished.  HENT:  Head: Normocephalic.  Neck: Normal range of motion.  Musculoskeletal: She exhibits tenderness.  Neurological: She is alert and oriented to person, place, and time.  Skin: Skin is warm and dry. Tibialis posterior pulse and dorsal pedis pulse, very weak on palpation cold toes on feet bilateral Psychiatric: She has a normal mood and affect.   Symmetric normal motor tone is noted throughout. Normal muscle bulk, except atrophy on her hands bilateral. Muscle testing reveals 5/5 muscle strength of the upper extremity, except wrist and finger muscles, and 5/5 of the lower extremity. Full range of motion in upper and lower extremities, except shoulders and wrist/ fingers bilateral. ROM of spine is restricted. Fine motor movements are restricted in both hands.  DTR in the upper and lower extremity are present and symmetric 2+. No clonus is noted.  Patient arises from chair without difficulty. Narrow based gait with normal arm swing bilateral .  Open wound on left calf, finally healed.        Assessment & Plan:  This a 62 year old female with  1. Rheumatid arthritis, Voltaren gel, to use for her increased joint pain  2. Fibromyalgia , Flexeril, she can take it tid prn muscle spasms/pain  3. Felty's syndrome  4. Mass in lung  5. Open wound on left calf,reinfected,treated by wound care center, has finally healed, patient has restarted her aquatic exercises.  6. Numbness in LLE, most likely circulatory, patient is a former smoker, she did not do her aquatic exercises which she usually does 5 times a week, for 2 weeks, since the last 2 weeks she has noted the numbness. Advised patient to restart her aquatics, and if the numbness does not go away, she should follow up with her PCP for further evaluation  Plan :  Patient  d/c the Fentanyl patch.  Continue with the increased the Hydrocodone 7.5 to q 4 hrs, # 180, max 6 per day.  Continue with other medication.  Continue with your aquatic exercise program as pain permits.  Advised patient to organize some time for herself to relax and get strength to be able to care for her husband.  Follow up in 1 month.

## 2013-03-22 NOTE — Patient Instructions (Signed)
Continue with your aquatic exercises 

## 2013-03-31 ENCOUNTER — Telehealth: Payer: Self-pay

## 2013-03-31 NOTE — Telephone Encounter (Signed)
Left patient a voicemail to return call to clinic regarding UDS results.

## 2013-04-01 ENCOUNTER — Telehealth: Payer: Self-pay

## 2013-04-01 NOTE — Telephone Encounter (Signed)
Called patient to let her know that Per Clydie Braun and Dr. Riley Kill she will be given a warning this time for Positive THC in her UDS from 03/22/13, and that if she has anymore inconsistent UDS's she will be D/C. Patient stated that she understood. She smoked marijuana to help with the pain and said that it did actually help relief her pain.

## 2013-04-13 ENCOUNTER — Ambulatory Visit: Payer: Medicare Other | Admitting: Physical Medicine and Rehabilitation

## 2013-04-19 ENCOUNTER — Encounter: Payer: Self-pay | Admitting: Physical Medicine and Rehabilitation

## 2013-04-19 ENCOUNTER — Encounter
Payer: Medicare Other | Attending: Physical Medicine and Rehabilitation | Admitting: Physical Medicine and Rehabilitation

## 2013-04-19 DIAGNOSIS — M069 Rheumatoid arthritis, unspecified: Secondary | ICD-10-CM

## 2013-04-19 DIAGNOSIS — R222 Localized swelling, mass and lump, trunk: Secondary | ICD-10-CM | POA: Insufficient documentation

## 2013-04-19 DIAGNOSIS — IMO0001 Reserved for inherently not codable concepts without codable children: Secondary | ICD-10-CM | POA: Insufficient documentation

## 2013-04-19 DIAGNOSIS — M0500 Felty's syndrome, unspecified site: Secondary | ICD-10-CM | POA: Insufficient documentation

## 2013-04-19 DIAGNOSIS — R209 Unspecified disturbances of skin sensation: Secondary | ICD-10-CM | POA: Insufficient documentation

## 2013-04-19 DIAGNOSIS — Z79899 Other long term (current) drug therapy: Secondary | ICD-10-CM | POA: Insufficient documentation

## 2013-04-19 MED ORDER — HYDROCODONE-ACETAMINOPHEN 7.5-325 MG PO TABS: 1.0000 | ORAL_TABLET | ORAL | Status: DC | PRN

## 2013-04-19 NOTE — Patient Instructions (Signed)
Continue with your exercise program as pain permits. 

## 2013-04-19 NOTE — Progress Notes (Signed)
Subjective:    Patient ID: [name], female    DOB: 23-Apr-1951, 62 y.o.   MRN: [id number]  HPI The patient is complaining about chronic pain from her Fibromyalgia and RA. She complain about joint and muscle pain. Her increased pain at the last visit has improved after I prescribed her the Flexeril and Voltaren gel.  The patient states, that she wants to decrease her Fentanyl, and maybe wants to get off it, she states, that she sweats more than usual with this medication.We decreased it to 12. at the last visit, today she states, that she could handle the lower dose, and would like to d/c the fentanyl.  She also complains about numbness in her entire left leg.  The patient states, that her husband has been diagnosed with Alzheimers, he is declining, which of course stresses the patient. Is grieving , because her dad died last month.   Pain Inventory Average Pain 7 Pain Right Now 8 My pain is aching  In the last 24 hours, has pain interfered with the following? General activity 8 Relation with others 7 Enjoyment of life 8 What TIME of day is your pain at its worst? morning Sleep (in general) Fair  Pain is worse with: walking and some activites Pain improves with: rest Relief from Meds: 4  Mobility walk without assistance how many minutes can you walk? 5 ability to climb steps?  yes do you drive?  yes  Function disabled: date disabled . I need assistance with the following:  dressing, meal prep, household duties and shopping  Neuro/Psych tingling depression anxiety  Prior Studies Any changes since last visit?  no  Physicians involved in your care Any changes since last visit?  no   Family History  Problem Relation Age of Onset  . Clotting disorder Mother     Coumadin  . Allergies Mother   . Heart disease Mother   . Stroke Father   . Heart disease Father   . Alzheimer's disease Father    History   Social History  . Marital Status: Married   Spouse Name: N/A    Number of Children: 1  . Years of Education: N/A   Occupational History  . disability    Social History Main Topics  . Smoking status: Former Smoker -- 0.20 packs/day for 40 years    Types: Cigarettes    Quit date: 12/16/2011  . Smokeless tobacco: Never Used  . Alcohol Use: Yes     Comment: social  . Drug Use: No  . Sexual Activity: None   Other Topics Concern  . None   Social History Narrative  . None   Past Surgical History  Procedure Laterality Date  . Total abdominal hysterectomy    . Tubal ligation    . Appendectomy    . Total knee arthroplasty      left  . Rotator cuff repair    . Foot fracture surgery    . Synovectomy of l thumb    . Fusion of r wrist    . Upper palate surgery for tmj syndrome    . Replacement total knee      left   Past Medical History  Diagnosis Date  . Diverticulosis   . Fibromyalgia   . Anemia   . Rheumatoid arthritis(714.0)   . Anxiety   . Blood transfusion   . GERD (gastroesophageal reflux disease)   . Myalgia   . Rheumatoid arthritis(714.0)   . Depression   . Joint  effusion   . Calcifying tendinitis of shoulder   . Myalgia and myositis    There were no vitals taken for this visit.   Review of Systems  Neurological:       Tingling  Psychiatric/Behavioral: Positive for dysphoric mood. The patient is nervous/anxious.   All other systems reviewed and are negative.       Objective:   Physical Exam Constitutional: She is oriented to person, place, and time. She appears well-developed and well-nourished.  HENT:  Head: Normocephalic.  Neck: Normal range of motion.  Musculoskeletal: She exhibits tenderness.  Neurological: She is alert and oriented to person, place, and time.  Skin: Skin is warm and dry. Tibialis posterior pulse and dorsal pedis pulse, very weak on palpation cold toes on feet bilateral Psychiatric: She has a normal mood and affect.  Symmetric normal motor tone is noted throughout.  Normal muscle bulk, except atrophy on her hands bilateral. Muscle testing reveals 5/5 muscle strength of the upper extremity, except wrist and finger muscles, and 5/5 of the lower extremity. Full range of motion in upper and lower extremities, except shoulders and wrist/ fingers bilateral. ROM of spine is restricted. Fine motor movements are restricted in both hands.  DTR in the upper and lower extremity are present and symmetric 2+. No clonus is noted.  Patient arises from chair without difficulty. Narrow based gait with normal arm swing bilateral .  Open wound on left calf, finally healed.        Assessment & Plan:  This a 62 year old female with  1. Rheumatid arthritis, Voltaren gel, to use for her increased joint pain  2. Fibromyalgia , Flexeril, she can take it tid prn muscle spasms/pain  3. Felty's syndrome  4. Mass in lung  5. Open wound on left calf,reinfected,treated by wound care center, has finally healed, patient has restarted her aquatic exercises.  6. Numbness in LLE, most likely circulatory, patient is a former smoker, she did not do her aquatic exercises which she usually does 5 times a week, for 2 weeks, since the last 2 weeks she has noted the numbness. Advised patient to restart her aquatics, and if the numbness does not go away, she should follow up with her PCP for further evaluation  Plan :  Patient d/c the Fentanyl patch, has a little more pain, but can manage, and does not want to go back on the Fentanyl patch.  Continue with the increased the Hydrocodone 7.5 to q 4 hrs, # 180, max 6 per day.  Continue with other medication.  Continue with your aquatic exercise program as pain permits.  Advised patient to organize some time for herself to relax and get strength to be able to care for her husband.  Follow up in 1 month.

## 2013-05-10 ENCOUNTER — Other Ambulatory Visit: Payer: Self-pay | Admitting: *Deleted

## 2013-05-10 DIAGNOSIS — M069 Rheumatoid arthritis, unspecified: Secondary | ICD-10-CM

## 2013-05-10 DIAGNOSIS — IMO0001 Reserved for inherently not codable concepts without codable children: Secondary | ICD-10-CM

## 2013-05-10 MED ORDER — HYDROCODONE-ACETAMINOPHEN 7.5-325 MG PO TABS: 1.0000 | ORAL_TABLET | ORAL | Status: DC | PRN

## 2013-05-10 NOTE — Telephone Encounter (Signed)
rx printed early for controlled medication for the visit with RN on 05/16/13 (to be signed by MD) 

## 2013-05-20 ENCOUNTER — Telehealth: Payer: Self-pay | Admitting: Physical Medicine & Rehabilitation

## 2013-05-20 NOTE — Telephone Encounter (Signed)
Patient picked up and signed for prescription 05/20/2013 @ 1318/hs

## 2013-05-27 ENCOUNTER — Other Ambulatory Visit: Payer: Self-pay

## 2013-05-27 MED ORDER — PREGABALIN 200 MG PO CAPS: ORAL_CAPSULE | ORAL | Status: DC

## 2013-06-01 ENCOUNTER — Other Ambulatory Visit: Payer: Self-pay | Admitting: *Deleted

## 2013-06-01 DIAGNOSIS — D72819 Decreased white blood cell count, unspecified: Secondary | ICD-10-CM

## 2013-06-01 MED ORDER — PREDNISONE 10 MG PO TABS: ORAL_TABLET | ORAL | Status: DC

## 2013-06-07 ENCOUNTER — Other Ambulatory Visit: Payer: Self-pay | Admitting: *Deleted

## 2013-06-14 ENCOUNTER — Other Ambulatory Visit: Payer: Self-pay | Admitting: *Deleted

## 2013-06-14 DIAGNOSIS — M069 Rheumatoid arthritis, unspecified: Secondary | ICD-10-CM

## 2013-06-14 DIAGNOSIS — IMO0001 Reserved for inherently not codable concepts without codable children: Secondary | ICD-10-CM

## 2013-06-14 MED ORDER — HYDROCODONE-ACETAMINOPHEN 7.5-325 MG PO TABS: 1.0000 | ORAL_TABLET | ORAL | Status: DC | PRN

## 2013-06-14 NOTE — Telephone Encounter (Signed)
RX printed early for controlled medication for the visit with RN on 06/17/13 (to be signed by MD)

## 2013-06-17 ENCOUNTER — Encounter: Payer: Medicare PPO | Attending: Physical Medicine & Rehabilitation | Admitting: *Deleted

## 2013-06-17 ENCOUNTER — Encounter: Payer: Self-pay | Admitting: *Deleted

## 2013-06-17 VITALS — BP 124/65 | HR 95 | Resp 14

## 2013-06-17 DIAGNOSIS — M12819 Other specific arthropathies, not elsewhere classified, unspecified shoulder: Secondary | ICD-10-CM

## 2013-06-17 DIAGNOSIS — IMO0001 Reserved for inherently not codable concepts without codable children: Secondary | ICD-10-CM | POA: Insufficient documentation

## 2013-06-17 DIAGNOSIS — M19019 Primary osteoarthritis, unspecified shoulder: Secondary | ICD-10-CM | POA: Insufficient documentation

## 2013-06-17 DIAGNOSIS — M069 Rheumatoid arthritis, unspecified: Secondary | ICD-10-CM | POA: Insufficient documentation

## 2013-06-17 DIAGNOSIS — J329 Chronic sinusitis, unspecified: Secondary | ICD-10-CM | POA: Insufficient documentation

## 2013-06-17 NOTE — Patient Instructions (Signed)
Follow up one month with RN for pill count and med refill  2 months with Dr Naaman Plummer or Love [facility]

## 2013-06-17 NOTE — Progress Notes (Signed)
Here for pill count and medication refills. Hydrocodone  7.5/325 # 180 Fill date  05/10/13   Today NV#18  Pill count appropriate  Refill given  VSS    Pain level: 4 .  No changes in pain level and medications except she has had a sinus infection and is currently taking an antibiotic. Return to see RN for med refill next month and then 2 months with Dr Naaman Plummer.

## 2013-06-29 ENCOUNTER — Other Ambulatory Visit: Payer: Self-pay | Admitting: Physical Medicine & Rehabilitation

## 2013-07-07 ENCOUNTER — Other Ambulatory Visit: Payer: Self-pay

## 2013-07-13 ENCOUNTER — Other Ambulatory Visit: Payer: Self-pay | Admitting: *Deleted

## 2013-07-13 DIAGNOSIS — IMO0001 Reserved for inherently not codable concepts without codable children: Secondary | ICD-10-CM

## 2013-07-13 DIAGNOSIS — M069 Rheumatoid arthritis, unspecified: Secondary | ICD-10-CM

## 2013-07-13 MED ORDER — HYDROCODONE-ACETAMINOPHEN 7.5-325 MG PO TABS: 1.0000 | ORAL_TABLET | ORAL | Status: DC | PRN

## 2013-07-13 NOTE — Telephone Encounter (Signed)
RX printed early for controlled medication for the visit with RN on 07/20/13 (to be signed by MD) 

## 2013-07-20 ENCOUNTER — Encounter: Payer: Self-pay | Admitting: *Deleted

## 2013-07-20 ENCOUNTER — Encounter: Payer: Medicare HMO | Attending: Physical Medicine & Rehabilitation | Admitting: *Deleted

## 2013-07-20 VITALS — BP 107/56 | HR 83 | Resp 14 | Wt 175.4 lb

## 2013-07-20 DIAGNOSIS — M069 Rheumatoid arthritis, unspecified: Secondary | ICD-10-CM

## 2013-07-20 DIAGNOSIS — Z5181 Encounter for therapeutic drug level monitoring: Secondary | ICD-10-CM | POA: Insufficient documentation

## 2013-07-20 DIAGNOSIS — Z79899 Other long term (current) drug therapy: Secondary | ICD-10-CM | POA: Insufficient documentation

## 2013-07-20 DIAGNOSIS — IMO0001 Reserved for inherently not codable concepts without codable children: Secondary | ICD-10-CM

## 2013-07-20 DIAGNOSIS — M12819 Other specific arthropathies, not elsewhere classified, unspecified shoulder: Secondary | ICD-10-CM

## 2013-07-20 NOTE — Progress Notes (Signed)
Here for pill count and medication refills.Hydrocodone 7.5/325 #180  Fill date 06/30/13   Today NV#  103  VSS    Pain level:5  [name] has not fallen in 6 months and is a moderate fall risk.  Educated on fall prevention and handout given for fall prevention in the home.  She says her husband has fallen and this is good information to have. Her pill count is appropriate and refill given. She will return in one month to see Dr Naaman Plummer at an appointment we set up last month.  No other changes or needs.

## 2013-07-20 NOTE — Patient Instructions (Signed)
Follow up with pre scheduled appt with Dr Naaman Plummer

## 2013-07-28 ENCOUNTER — Other Ambulatory Visit: Payer: Self-pay | Admitting: Physical Medicine & Rehabilitation

## 2013-08-01 ENCOUNTER — Ambulatory Visit
Admission: RE | Admit: 2013-08-01 | Discharge: 2013-08-01 | Disposition: A | Discharge status: Home / Self Care | Payer: Commercial Managed Care - HMO | Source: Ambulatory Visit | Attending: Internal Medicine | Admitting: Internal Medicine

## 2013-08-01 ENCOUNTER — Other Ambulatory Visit: Payer: Self-pay | Admitting: Internal Medicine

## 2013-08-01 DIAGNOSIS — R52 Pain, unspecified: Secondary | ICD-10-CM

## 2013-08-01 DIAGNOSIS — R609 Edema, unspecified: Secondary | ICD-10-CM

## 2013-08-06 ENCOUNTER — Telehealth: Payer: Self-pay | Admitting: *Deleted

## 2013-08-06 ENCOUNTER — Encounter: Payer: Self-pay | Admitting: *Deleted

## 2013-08-06 NOTE — Telephone Encounter (Signed)
Former pt of Dr. Darnell Level, Letter and [place] printed. F/u with Dr. Alvy Bimler scheduled 03/30/14

## 2013-08-08 ENCOUNTER — Encounter (INDEPENDENT_AMBULATORY_CARE_PROVIDER_SITE_OTHER): Payer: Self-pay | Admitting: General Surgery

## 2013-08-08 ENCOUNTER — Ambulatory Visit (INDEPENDENT_AMBULATORY_CARE_PROVIDER_SITE_OTHER): Payer: Commercial Managed Care - HMO | Admitting: General Surgery

## 2013-08-08 ENCOUNTER — Other Ambulatory Visit (INDEPENDENT_AMBULATORY_CARE_PROVIDER_SITE_OTHER): Payer: Self-pay | Admitting: General Surgery

## 2013-08-08 VITALS — BP 126/80 | HR 78 | Temp 98.4°F | Resp 16 | Ht 62.0 in | Wt 168.0 lb

## 2013-08-08 DIAGNOSIS — L03119 Cellulitis of unspecified part of limb: Principal | ICD-10-CM | POA: Insufficient documentation

## 2013-08-08 DIAGNOSIS — L02419 Cutaneous abscess of limb, unspecified: Secondary | ICD-10-CM | POA: Insufficient documentation

## 2013-08-08 DIAGNOSIS — S81801A Unspecified open wound, right lower leg, initial encounter: Secondary | ICD-10-CM

## 2013-08-08 NOTE — Progress Notes (Signed)
Subjective:     Patient ID: [name], female   DOB: 06/06/1951, 63 y.o.   MRN: [id number]  HPI The patient comes in today with an infection of her right back which started about 10 days ago. She was seen twice by her primary care physician who started her on Keflex on Friday. Since then the patient has had some continued drainage from the wound but only if he clears oranges fluid. No pus is drained.  Over the weekend it appears as though the wound has improved in terms of redness and warmth  Review of Systems The patient has rheumatoid arthritis and is on chronic steroids.    Objective:   Physical Exam Pictures of the patient's right lower extremity wound are included on the chart. Upon examining the patient's right lower extremity it appears to have been injured before and the patient confirms that a stating that she had an injury near her ankle a couple years ago and one in the midportion of her pretibial area previously this draining area is right in the central portion of the upper scar of her leg. Covering the entire wound is dead skin which was easily removed by just wiping the wound with a moist gauze.  After Betadine prep and local anesthetic I was able to get into a small pocket which extended medially and inferiorly. I was able to pack her with approximately 6-8 inches of quarter-inch iodoform gauze. No pus was encountered however a culture was sent. The patient is on Keflex therapy.    Assessment:     Cellulitis and infection of right lower extremity     Plan:     The wound was packed with iodoform Nu Gauze. This should be removed in 2 days. She should continue to take her Keflex therapy until this is completely healed. The packing should be removed in 2 days as mentioned previously. Like to see the patient on March 17. She's been advised that if he should worsen after the procedure today and she should come in and see someone sooner.

## 2013-08-11 LAB — WOUND CULTURE
Gram Stain: NONE SEEN
Gram Stain: NONE SEEN
Gram Stain: NONE SEEN

## 2013-08-15 ENCOUNTER — Encounter: Payer: Medicare HMO | Attending: Physical Medicine & Rehabilitation | Admitting: Physical Medicine & Rehabilitation

## 2013-08-15 ENCOUNTER — Encounter: Payer: Self-pay | Admitting: Physical Medicine & Rehabilitation

## 2013-08-15 ENCOUNTER — Other Ambulatory Visit: Payer: Self-pay

## 2013-08-15 VITALS — BP 96/71 | HR 90 | Resp 14 | Ht 62.0 in | Wt 165.0 lb

## 2013-08-15 DIAGNOSIS — M12819 Other specific arthropathies, not elsewhere classified, unspecified shoulder: Secondary | ICD-10-CM

## 2013-08-15 DIAGNOSIS — M19019 Primary osteoarthritis, unspecified shoulder: Secondary | ICD-10-CM

## 2013-08-15 DIAGNOSIS — Z1231 Encounter for screening mammogram for malignant neoplasm of breast: Secondary | ICD-10-CM

## 2013-08-15 DIAGNOSIS — M069 Rheumatoid arthritis, unspecified: Secondary | ICD-10-CM

## 2013-08-15 DIAGNOSIS — IMO0001 Reserved for inherently not codable concepts without codable children: Secondary | ICD-10-CM | POA: Insufficient documentation

## 2013-08-15 MED ORDER — HYDROCODONE-ACETAMINOPHEN 7.5-325 MG PO TABS: 1.0000 | ORAL_TABLET | ORAL | Status: DC | PRN

## 2013-08-15 NOTE — Progress Notes (Signed)
Subjective:    Patient ID: [name], female    DOB: Mar 26, 1951, 63 y.o.   MRN: [id number]  HPI  [name] is back regarding her chronic pain. She has been working on weight loss. She has lost almost 20 lbs since starting the diet. For exercise, she walks frequently. She might walk 20 minutes per attempt.   Medically, she developed skin lesions on her legs which looked like blisters or a boyle. She is seeing Dr. Hulen Skains to assess.    Her RA has flared from time to time, but essentially, it has remained stable. She has, however, lost some functional use of her hands due to the worsening deformities. She is interested in options to help maintain use of her hands.   She is down to about 3-4 hydrocodones per day--some days more.  she's off the fentanyl patch.   Her husband has developed dementia and requires supervision most of the time.       Pain Inventory Average Pain 5 Pain Right Now 5 My pain is aching  In the last 24 hours, has pain interfered with the following? General activity 4 Relation with others 4 Enjoyment of life 4 What TIME of day is your pain at its worst? morning Sleep (in general) Fair  Pain is worse with: walking, bending, standing and some activites Pain improves with: rest, medication and injections Relief from Meds: 5  Mobility walk without assistance how many minutes can you walk? 15 ability to climb steps?  yes do you drive?  yes transfers alone Do you have any goals in this area?  no  Function disabled: date disabled 01/16/2008 I need assistance with the following:  dressing, meal prep, household duties and shopping Do you have any goals in this area?  no  Neuro/Psych bladder control problems tingling depression anxiety  Prior Studies Any changes since last visit?  no  Physicians involved in your care Any changes since last visit?  yes surgeon- Dr. Hulen Skains @ [facility]   Family History  Problem Relation Age of  Onset  . Clotting disorder Mother     Coumadin  . Allergies Mother   . Heart disease Mother   . Stroke Father   . Heart disease Father   . Alzheimer's disease Father    History   Social History  . Marital Status: Married    Spouse Name: N/A    Number of Children: 1  . Years of Education: N/A   Occupational History  . disability    Social History Main Topics  . Smoking status: Former Smoker -- 0.20 packs/day for 40 years    Types: Cigarettes    Quit date: 12/16/2011  . Smokeless tobacco: Never Used  . Alcohol Use: Yes     Comment: social  . Drug Use: No  . Sexual Activity: None   Other Topics Concern  . None   Social History Narrative  . None   Past Surgical History  Procedure Laterality Date  . Total abdominal hysterectomy    . Tubal ligation    . Appendectomy    . Total knee arthroplasty      left  . Rotator cuff repair    . Foot fracture surgery    . Synovectomy of l thumb    . Fusion of r wrist    . Upper palate surgery for tmj syndrome    . Replacement total knee      left   Past Medical History  Diagnosis  Date  . Diverticulosis   . Fibromyalgia   . Anemia   . Rheumatoid arthritis(714.0)   . Anxiety   . Blood transfusion   . GERD (gastroesophageal reflux disease)   . Myalgia   . Rheumatoid arthritis(714.0)   . Depression   . Joint effusion   . Calcifying tendinitis of shoulder   . Myalgia and myositis    BP 96/71  Pulse 90  Resp 14  Ht 5\' 2"  (1.575 m)  Wt 165 lb (74.844 kg)  BMI 30.17 kg/m2  SpO2 99%  Opioid Risk Score: 1 Fall Risk Score: Moderate Fall Risk (6-13 points) (pt educated on fall risk, pt received brochure previously.)    Review of Systems  Constitutional: Positive for unexpected weight change.  Genitourinary:       Bladder control problems  Musculoskeletal: Positive for myalgias.  Neurological:       Tingling  Hematological: Bruises/bleeds easily.  Psychiatric/Behavioral: Positive for dysphoric mood. The patient  is nervous/anxious.   All other systems reviewed and are negative.       Objective:   Physical Exam  Physical Exam  Constitutional: She appears well-developed and well-nourished.  HENT:  Head: Normocephalic and atraumatic.  Eyes: Conjunctivae and EOM are normal. Pupils are equal, round, and reactive to light.  Neck: Normal range of motion. Neck supple.  Cardiovascular: Normal rate and regular rhythm.  Pulmonary/Chest: Effort normal and breath sounds normal.  Abdominal: Soft.  Musculoskeletal:  Right shoulder: She exhibits decreased range of motion, tenderness and bony tenderness.  Left shoulder: She exhibits decreased range of motion, tenderness and bony tenderness.  Hands with severe ulnar deviation. 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 MCP's are swollen/large.  Grip is decreased. She has deviation of the toes on the right foot more than left. Shuffles feet with gait.  Neurological: She has normal strength. No cranial nerve deficit or sensory deficit. She displays a negative Romberg sign.  Reflex Scores:  Tricep reflexes are 1+ on the right side and 1+ on the left side.  Bicep reflexes are 1+ on the right side and 1+ on the left side.  Brachioradialis reflexes are 1+ on the right side and 1+ on the left side.  Patellar reflexes are 1+ on the right side and 1+ on the left side.  Achilles reflexes are 1+ on the right side and 1+ on the left side. Psychiatric: She has a normal mood and affect. Her behavior is normal. Judgment and thought content normal.    Assessment & Plan:   ASSESSMENT:  1. History of rheumatoid arthritis and fibromyalgia  2. Bilateral rotator cuff syndrome.    PLAN:  1.Will make a referral to hand surgery regarding options for her hands. She wants to maintain an element of functionality especially with her right hand. Joint replacement might be an option 2. Continue with HEP 3. Refilled her hydrocodone 7.5/325 one q.4-6 prn #180  4. Rheum  follow up as indicated.    6. Discussed use of melatonin as a sleep aid. She may try 1-3 qhs.

## 2013-08-15 NOTE — Patient Instructions (Signed)
PLEASE CALL ME WITH ANY PROBLEMS OR QUESTIONS (#[phone]).      

## 2013-08-16 ENCOUNTER — Telehealth: Payer: Self-pay | Admitting: Physical Medicine & Rehabilitation

## 2013-08-16 NOTE — Telephone Encounter (Signed)
Dr. Levell July office does not accept patient's insurance for referral for surgical management of RA hands

## 2013-08-17 NOTE — Telephone Encounter (Signed)
What does this mean? He doesn't accept insurance or he doesn't accept RA patients

## 2013-08-17 NOTE — Telephone Encounter (Signed)
Dr. Levell July office does not take [employer]

## 2013-08-17 NOTE — Telephone Encounter (Signed)
Can we please find out WHO does instead of communicating in circles here at our office?    :)  Thank you  ZTS

## 2013-08-23 ENCOUNTER — Encounter (INDEPENDENT_AMBULATORY_CARE_PROVIDER_SITE_OTHER): Payer: Self-pay | Admitting: General Surgery

## 2013-08-23 ENCOUNTER — Ambulatory Visit (INDEPENDENT_AMBULATORY_CARE_PROVIDER_SITE_OTHER): Payer: Commercial Managed Care - HMO | Admitting: General Surgery

## 2013-08-23 VITALS — BP 122/80 | HR 75 | Temp 97.8°F | Resp 16 | Ht 62.0 in | Wt 160.0 lb

## 2013-08-23 DIAGNOSIS — L03119 Cellulitis of unspecified part of limb: Secondary | ICD-10-CM

## 2013-08-23 DIAGNOSIS — L02419 Cutaneous abscess of limb, unspecified: Secondary | ICD-10-CM

## 2013-08-23 DIAGNOSIS — T148XXA Other injury of unspecified body region, initial encounter: Principal | ICD-10-CM

## 2013-08-23 DIAGNOSIS — L089 Local infection of the skin and subcutaneous tissue, unspecified: Secondary | ICD-10-CM

## 2013-08-23 DIAGNOSIS — T07XXXA Unspecified multiple injuries, initial encounter: Secondary | ICD-10-CM

## 2013-08-23 MED ORDER — SULFAMETHOXAZOLE-TRIMETHOPRIM 400-80 MG PO TABS: 1.0000 | ORAL_TABLET | Freq: Two times a day (BID) | ORAL | Status: AC

## 2013-08-23 NOTE — Progress Notes (Signed)
Subjective:     Patient ID: [name], female   DOB: 1951-04-04, 63 y.o.   MRN: [id number]  HPI The patient comes in today with minimal symptoms from her left leg wound.  She has been treating it with Polysporin ointment and a sterile 4 x 4 gauze. She has not been on any antibiotics for several weeks.  Review of Systems No fevers or chills. No discomfort or pain in the leg unless it happens to hit something.    Objective:   Physical Exam On examination today the amount of swelling and erythema around her left leg wound has improved. There is a small skin bridge between the lateral and medial aspect of the bone under which there was some purulent drainage. A repeat culture was sent. The last time this was cultured it demonstrated Staphylococcus aureus. She was on appropriate therapy.  The picture of the current wound is on the chart.    Assessment:     The patient continues to have some cellulitis and erythema around the wound however it is minimally discomforting and she has no fevers or chills the because there was still some purulent drainage in the wound I will place the patient back on 10 days of antibiotic therapy which will be Septra DS.     Plan:     Placed the patient on antibiotics and continue to dress the wound with triple antibiotic ointment and 4 x 4 gauze. All also place the patient on Septra DS therapy. I will see the patient back in 2 weeks.

## 2013-08-25 ENCOUNTER — Telehealth: Payer: Self-pay | Admitting: Hematology and Oncology

## 2013-08-25 NOTE — Telephone Encounter (Signed)
Pt called back and she is aware of athe appt with dr. Alvy Bimler this April 2015

## 2013-08-25 NOTE — Telephone Encounter (Signed)
Called pt and r/s appt appt to 4/3 with Dr Alvy Bimler  a  former  Dr Riki Sheer ,mailed pt appt

## 2013-08-27 LAB — WOUND CULTURE: GRAM STAIN: NONE SEEN

## 2013-08-29 ENCOUNTER — Ambulatory Visit
Admission: RE | Admit: 2013-08-29 | Discharge: 2013-08-29 | Disposition: A | Discharge status: Home / Self Care | Payer: Commercial Managed Care - HMO | Source: Ambulatory Visit

## 2013-08-29 DIAGNOSIS — Z1231 Encounter for screening mammogram for malignant neoplasm of breast: Secondary | ICD-10-CM

## 2013-09-03 ENCOUNTER — Other Ambulatory Visit: Payer: Self-pay | Admitting: Physical Medicine & Rehabilitation

## 2013-09-06 ENCOUNTER — Telehealth (INDEPENDENT_AMBULATORY_CARE_PROVIDER_SITE_OTHER): Payer: Self-pay | Admitting: *Deleted

## 2013-09-06 ENCOUNTER — Ambulatory Visit (INDEPENDENT_AMBULATORY_CARE_PROVIDER_SITE_OTHER): Payer: Commercial Managed Care - HMO | Admitting: General Surgery

## 2013-09-06 ENCOUNTER — Other Ambulatory Visit (INDEPENDENT_AMBULATORY_CARE_PROVIDER_SITE_OTHER): Payer: Self-pay

## 2013-09-06 ENCOUNTER — Encounter (INDEPENDENT_AMBULATORY_CARE_PROVIDER_SITE_OTHER): Payer: Self-pay | Admitting: General Surgery

## 2013-09-06 VITALS — BP 106/78 | HR 80 | Temp 97.2°F | Resp 16 | Ht 62.0 in | Wt 158.2 lb

## 2013-09-06 DIAGNOSIS — T8189XA Other complications of procedures, not elsewhere classified, initial encounter: Secondary | ICD-10-CM

## 2013-09-06 DIAGNOSIS — L02419 Cutaneous abscess of limb, unspecified: Secondary | ICD-10-CM

## 2013-09-06 DIAGNOSIS — L97909 Non-pressure chronic ulcer of unspecified part of unspecified lower leg with unspecified severity: Secondary | ICD-10-CM

## 2013-09-06 DIAGNOSIS — L03119 Cellulitis of unspecified part of limb: Principal | ICD-10-CM

## 2013-09-06 NOTE — Progress Notes (Signed)
Subjective:     Patient ID: [name], female   DOB: 12/16/1950, 63 y.o.   MRN: [id number]  HPI In spite of taking her oral antibiotics religiously the patient's wound is not significantly healed. There is still an area of necrosis and nonhealing and significant erythema.  Review of Systems No fevers or chills. Minimal discomfort in her leg wound.    Objective:   Physical Exam The amount of swelling in her leg wound is about the same but the erythema seems to have increased. There is still skin bridges in areas of necrosis underneath the surface. The picture is attached.       Assessment:     No significant improvement in her right leg wound with antibiotic therapy and local treatment. The next up would be a debridement  however I would like to have her reassessed in the wound care clinic at [facility] long prior to doing so.    Plan:      I am making a referral for the patient to see Dr. Steele Sizer for wound healing and assessment. Further debridement may be necessary in the future. I will see the patient again in one month.

## 2013-09-06 NOTE — Telephone Encounter (Signed)
LMOM for pt to call back.  She needs to be aware of her appt with Dr. Migdalia Dk @ [facility], 4.13.15 arrival time 9:15 and appt time 9:30.Anderson Malta

## 2013-09-06 NOTE — Telephone Encounter (Signed)
Pt called and I gave her appt information below.Anderson Malta

## 2013-09-08 ENCOUNTER — Other Ambulatory Visit: Payer: Self-pay | Admitting: Hematology and Oncology

## 2013-09-08 DIAGNOSIS — M0500 Felty's syndrome, unspecified site: Secondary | ICD-10-CM

## 2013-09-09 ENCOUNTER — Other Ambulatory Visit (HOSPITAL_BASED_OUTPATIENT_CLINIC_OR_DEPARTMENT_OTHER): Payer: Commercial Managed Care - HMO

## 2013-09-09 ENCOUNTER — Telehealth: Payer: Self-pay | Admitting: Hematology and Oncology

## 2013-09-09 ENCOUNTER — Ambulatory Visit (HOSPITAL_BASED_OUTPATIENT_CLINIC_OR_DEPARTMENT_OTHER): Payer: Commercial Managed Care - HMO | Admitting: Hematology and Oncology

## 2013-09-09 ENCOUNTER — Encounter: Payer: Self-pay | Admitting: Hematology and Oncology

## 2013-09-09 VITALS — BP 105/65 | HR 87 | Temp 98.8°F | Resp 20 | Ht 62.0 in | Wt 159.1 lb

## 2013-09-09 DIAGNOSIS — D704 Cyclic neutropenia: Secondary | ICD-10-CM

## 2013-09-09 DIAGNOSIS — D696 Thrombocytopenia, unspecified: Secondary | ICD-10-CM

## 2013-09-09 DIAGNOSIS — D649 Anemia, unspecified: Secondary | ICD-10-CM

## 2013-09-09 DIAGNOSIS — D708 Other neutropenia: Secondary | ICD-10-CM

## 2013-09-09 DIAGNOSIS — L988 Other specified disorders of the skin and subcutaneous tissue: Secondary | ICD-10-CM

## 2013-09-09 DIAGNOSIS — M0500 Felty's syndrome, unspecified site: Secondary | ICD-10-CM

## 2013-09-09 DIAGNOSIS — M359 Systemic involvement of connective tissue, unspecified: Secondary | ICD-10-CM

## 2013-09-09 DIAGNOSIS — M069 Rheumatoid arthritis, unspecified: Secondary | ICD-10-CM

## 2013-09-09 LAB — COMPREHENSIVE METABOLIC PANEL (CC13)
ALBUMIN: 3.4 g/dL — AB (ref 3.5–5.0)
ALK PHOS: 43 U/L (ref 40–150)
ALT: 7 U/L (ref 0–55)
AST: 11 U/L (ref 5–34)
Anion Gap: 11 mEq/L (ref 3–11)
BUN: 5.5 mg/dL — ABNORMAL LOW (ref 7.0–26.0)
CHLORIDE: 106 meq/L (ref 98–109)
CO2: 25 mEq/L (ref 22–29)
CREATININE: 0.7 mg/dL (ref 0.6–1.1)
Calcium: 9.1 mg/dL (ref 8.4–10.4)
Glucose: 126 mg/dl (ref 70–140)
POTASSIUM: 4 meq/L (ref 3.5–5.1)
Sodium: 142 mEq/L (ref 136–145)
Total Bilirubin: 0.53 mg/dL (ref 0.20–1.20)
Total Protein: 7.1 g/dL (ref 6.4–8.3)

## 2013-09-09 LAB — CBC & DIFF AND RETIC
HEMATOCRIT: 34.7 % — AB (ref 34.8–46.6)
HEMOGLOBIN: 11.5 g/dL — AB (ref 11.6–15.9)
IMMATURE RETIC FRACT: 6.7 % (ref 1.60–10.00)
MCH: 30 pg (ref 25.1–34.0)
MCHC: 33.1 g/dL (ref 31.5–36.0)
MCV: 90.6 fL (ref 79.5–101.0)
PLATELETS: 124 10*3/uL — AB (ref 145–400)
RBC: 3.83 10*6/uL (ref 3.70–5.45)
RDW: 15.9 % — ABNORMAL HIGH (ref 11.2–14.5)
RETIC %: 1.95 % (ref 0.70–2.10)
Retic Ct Abs: 74.69 10*3/uL (ref 33.70–90.70)
WBC: 0.7 10*3/uL — AB (ref 3.9–10.3)

## 2013-09-09 LAB — MANUAL DIFFERENTIAL
ALC: 0.4 10*3/uL — AB (ref 0.9–3.3)
ANC (CHCC manual diff): 0.2 10*3/uL — CL (ref 1.5–6.5)
BLASTS: 0 % (ref 0–0)
Band Neutrophils: 3 % (ref 0–10)
Basophil: 3 % — ABNORMAL HIGH (ref 0–2)
EOS: 0 % (ref 0–7)
LYMPH: 57 % — AB (ref 14–49)
MONO: 16 % — AB (ref 0–14)
Metamyelocytes: 0 % (ref 0–0)
Myelocytes: 0 % (ref 0–0)
NRBC: 0 % (ref 0–0)
Other Cell: 0 % (ref 0–0)
PLT EST: DECREASED
PROMYELO: 0 % (ref 0–0)
SEG: 21 % — ABNORMAL LOW (ref 38–77)
VARIANT LYMPH: 0 % (ref 0–0)

## 2013-09-09 LAB — CHCC SMEAR

## 2013-09-09 MED ORDER — TBO-FILGRASTIM 480 MCG/0.8ML ~~LOC~~ SOSY
480.0000 ug | PREFILLED_SYRINGE | Freq: Once | SUBCUTANEOUS | Status: AC
  Administered 2013-09-09: 480 ug via SUBCUTANEOUS
  Filled 2013-09-09: qty 0.8

## 2013-09-09 NOTE — Progress Notes (Signed)
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 Bone marrow aspirate and biopsy was performed in 2002 and 2004 with no abnormalities found. The patient was treated with G-CSF with good response. She was subsequently started on methotrexate weekly along with prednisone.  #2 history of anemia The patient had history of GI bleed in 2012 are negative endoscopy and colonoscopy. INTERVAL HISTORY: [name] 63 y.o. female returns for further followup. She has chronic nonhealing wound in the right lower extremity and she's been doing dressing changes herself.  She is to have chronic nasal drainage with nonproductive cough. This has been present for at least a month. She denies any fevers, chills or rigors. She denies any active bleeding from the wound. She had some poor appetite and some weight loss for unknown reason.  I have reviewed the past medical history, past surgical history, social history and family history with the patient and they are unchanged from previous note.  ALLERGIES:  is allergic to penicillins.  MEDICATIONS:  Current Outpatient Prescriptions  Medication Sig Dispense Refill  . Alum & Mag Hydroxide-Simeth (MAGIC MOUTHWASH W/LIDOCAINE) SOLN Take 5 mLs by mouth 3 (three) times daily as needed.  240 mL  0  . Ascorbic Acid (VITAMIN C) 1000 MG tablet Take 1,000 mg by mouth daily.      . calcium carbonate (CALCIUM 600) 600 MG TABS Take 600 mg by mouth 2 (two) times daily with a meal.        . Cholecalciferol (VITAMIN D) 1000 UNITS capsule Take 1,000 Units by  mouth daily.       . cyclobenzaprine (FLEXERIL) 10 MG tablet Take 1 tablet (10 mg total) by mouth 3 (three) times daily as needed for muscle spasms.  90 tablet  2  . diclofenac sodium (VOLTAREN) 1 % GEL Apply 2 g topically 4 (four) times daily.  3 Tube  2  . DULoxetine (CYMBALTA) 60 MG capsule TAKE ONE CAPSULE BY MOUTH TWICE DAILY  180 capsule  3  . folic acid (FOLVITE) 1 MG tablet Take 1 mg by mouth daily.      Marland Kitchen HYDROcodone-acetaminophen (NORCO) 7.5-325 MG per tablet Take 1 tablet by mouth every 4 (four) hours as needed. Max 6 per day  180 tablet  0  . Melatonin 10 MG CAPS Take 1 tablet by mouth at bedtime.      . methotrexate (RHEUMATREX) 2.5 MG tablet       . nortriptyline (PAMELOR) 50 MG capsule TAKE TWO CAPSULES BY MOUTH AT BEDTIME  60 capsule  0  . omeprazole (PRILOSEC) 20 MG capsule Take 20 mg by mouth 2 (two) times daily.       . predniSONE (DELTASONE) 10 MG tablet TAKE DAILY BY MOUTH AS DIRECTED BY PHYSICIAN.  60 tablet  3  . pregabalin (LYRICA) 200 MG capsule TAKE 1 CAPSULE BY MOUTH TWICE DAILY  60 capsule  0  . Probiotic Product (PROBIOTIC FORMULA) CAPS Take 1 capsule by mouth daily.        . vitamin B-12 (CYANOCOBALAMIN) 1000 MCG tablet Take 1,000 mcg by mouth every other day.       . [DISCONTINUED] POTASSIUM PO Take 20 mEq  by mouth 2 (two) times daily.       No current facility-administered medications for this visit.     REVIEW OF SYSTEMS:   Eyes: Denies blurriness of vision Ears, nose, mouth, throat, and face: Denies mucositis or sore throat Cardiovascular: Denies palpitation, chest discomfort or lower extremity swelling Gastrointestinal:  Denies nausea, heartburn or change in bowel habits Neurological:Denies numbness, tingling or new weaknesses Behavioral/Psych: Mood is stable, no new changes  All other systems were reviewed with the patient and are negative.  PHYSICAL EXAMINATION: ECOG PERFORMANCE STATUS: 1 - Symptomatic but completely ambulatory  Filed Vitals:    09/09/13 0924  BP: 105/65  Pulse: 87  Temp: 98.8 F (37.1 C)  Resp: 20   Filed Weights   09/09/13 0924  Weight: 159 lb 1.6 oz (72.167 kg)    GENERAL:alert, no distress and comfortable. She looked cushingoid SKIN: She has significant thin skin and easy bruising. No petechiae rash. She has a nonhealing wound in the anterior lower part of her shin. EYES: normal, Conjunctiva are pink and non-injected, sclera clear OROPHARYNX:no exudate, no erythema and lips, buccal mucosa, and tongue normal . No oral thrush NECK: supple, thyroid normal size, non-tender, without nodularity LYMPH:  no palpable lymphadenopathy in the cervical, axillary or inguinal LUNGS: clear to auscultation and percussion with normal breathing effort HEART: regular rate & rhythm and no murmurs with mild bilateral lower extremity edema ABDOMEN:abdomen soft, non-tender and normal bowel sounds. Unable to appreciate hepatosplenomegaly. Musculoskeletal:no cyanosis of digits and no clubbing  NEURO: alert & oriented x 3 with fluent speech, no focal motor/sensory deficits  LABORATORY DATA:  I have reviewed the data as listed Results for orders placed in visit on 09/09/13 (from the past 48 hour(s))  CBC & DIFF AND RETIC     Status: Abnormal   Collection Time    09/09/13  9:13 AM      Result Value Ref Range   WBC 0.7 (*) 3.9 - 10.3 10e3/uL   HGB 11.5 (*) 11.6 - 15.9 g/dL   HCT 34.7 (*) 34.8 - 46.6 %   Platelets 124 (*) 145 - 400 10e3/uL   MCV 90.6  79.5 - 101.0 fL   MCH 30.0  25.1 - 34.0 pg   MCHC 33.1  31.5 - 36.0 g/dL   RBC 3.83  3.70 - 5.45 10e6/uL   RDW 15.9 (*) 11.2 - 14.5 %   Retic % 1.95  0.70 - 2.10 %   Retic Ct Abs 74.69  33.70 - 90.70 10e3/uL   Immature Retic Fract 6.70  1.60 - 10.00 %  [facility] SMEAR     Status: None   Collection Time    09/09/13  9:13 AM      Result Value Ref Range   Smear Result Smear Available    MANUAL DIFFERENTIAL     Status: Abnormal   Collection Time    09/09/13  9:13 AM      Result  Value Ref Range   ANC ([facility] manual diff) 0.2 (*) 1.5 - 6.5 10e3/uL   ALC 0.4 (*) 0.9 - 3.3 10e3/uL   SEG 21 (*) 38 - 77 %   Band Neutrophils 3  0 - 10 %   LYMPH 57 (*) 14 - 49 %   MONO 16 (*) 0 - 14 %   EOS 0  0 - 7 %   Basophil 3 (*) 0 - 2 %   Metamyelocytes 0  0 - 0 %   Myelocytes 0  0 - 0 %   PROMYELO 0  0 - 0 %   Blasts 0  0 - 0 %   Variant Lymph 0  0 - 0 %   Other Cell 0  0 - 0 %   nRBC 0  0 - 0 %   Ovalocytes Few  Negative   PLT EST Decreased  Adequate  COMPREHENSIVE METABOLIC PANEL (XV40)     Status: Abnormal   Collection Time    09/09/13  9:13 AM      Result Value Ref Range   Sodium 142  136 - 145 mEq/L   Potassium 4.0  3.5 - 5.1 mEq/L   Chloride 106  98 - 109 mEq/L   CO2 25  22 - 29 mEq/L   Glucose 126  70 - 140 mg/dl   BUN 5.5 (*) 7.0 - 26.0 mg/dL   Creatinine 0.7  0.6 - 1.1 mg/dL   Total Bilirubin 0.53  0.20 - 1.20 mg/dL   Alkaline Phosphatase 43  40 - 150 U/L   AST 11  5 - 34 U/L   ALT 7  0 - 55 U/L   Total Protein 7.1  6.4 - 8.3 g/dL   Albumin 3.4 (*) 3.5 - 5.0 g/dL   Calcium 9.1  8.4 - 10.4 mg/dL   Anion Gap 11  3 - 11 mEq/L    Lab Results  Component Value Date   WBC 0.7* 09/09/2013   HGB 11.5* 09/09/2013   HCT 34.7* 09/09/2013   MCV 90.6 09/09/2013   PLT 124* 09/09/2013   ASSESSMENT & PLAN:  #1 severe autoimmune neutropenia #2 chronic nonhealing wound I recommend her to stop methotrexate. It is causing poor wound healing and put her at risk of infection. Her neutropenia is not improving a while on methotrexate. I recommend starting her on G-CSF daily until all white count resolved. Once the wound is completely healed, we could consider second line treatment for her autoimmune neutropenia. I would like to try infusional rituximab in the future. For her chronic nonhealing wound, recommend continue dressing changes and followup at the wound care center #3 anemia This is likely anemia of chronic disease. The patient denies recent history of bleeding such as  epistaxis, hematuria or hematochezia. She is asymptomatic from the anemia. We will observe for now.  She does not require transfusion now.  #4 mild thrombocytopenia This is likely due to recent treatment with methotrexate. The patient denies recent history of bleeding such as epistaxis, hematuria or hematochezia. She is asymptomatic from the low platelet count. I will observe for now.  she does not require transfusion now. I will continue the chemotherapy at current dose without  #5 rheumatoid arthritis She may have an acute flare with discontinuation of methotrexate. I recommend she call her rheumatologist for further management.  All questions were answered. The patient knows to call the clinic with any problems, questions or concerns. No barriers to learning was detected.  I spent 40 minutes counseling the patient face to face. The total time spent in the appointment was 55 minutes and more than 50% was on counseling.     Erika Wollen, MD 09/09/2013 10:22 AM

## 2013-09-09 NOTE — Telephone Encounter (Signed)
gv and printed aptp sched and avs for pt for April.... °

## 2013-09-10 ENCOUNTER — Ambulatory Visit (HOSPITAL_BASED_OUTPATIENT_CLINIC_OR_DEPARTMENT_OTHER): Payer: Commercial Managed Care - HMO

## 2013-09-10 VITALS — BP 103/67 | HR 94 | Temp 98.0°F | Resp 20

## 2013-09-10 DIAGNOSIS — D704 Cyclic neutropenia: Principal | ICD-10-CM

## 2013-09-10 DIAGNOSIS — M359 Systemic involvement of connective tissue, unspecified: Secondary | ICD-10-CM

## 2013-09-10 DIAGNOSIS — D708 Other neutropenia: Secondary | ICD-10-CM

## 2013-09-10 MED ORDER — TBO-FILGRASTIM 480 MCG/0.8ML ~~LOC~~ SOSY
480.0000 ug | PREFILLED_SYRINGE | Freq: Once | SUBCUTANEOUS | Status: AC
Start: 2013-09-10 — End: 2013-09-10
  Administered 2013-09-10: 480 ug via SUBCUTANEOUS

## 2013-09-12 ENCOUNTER — Other Ambulatory Visit: Payer: Medicare Other

## 2013-09-12 ENCOUNTER — Ambulatory Visit: Payer: Medicare Other | Admitting: Nurse Practitioner

## 2013-09-12 ENCOUNTER — Other Ambulatory Visit: Payer: Self-pay | Admitting: Hematology and Oncology

## 2013-09-12 ENCOUNTER — Other Ambulatory Visit (HOSPITAL_BASED_OUTPATIENT_CLINIC_OR_DEPARTMENT_OTHER): Payer: Commercial Managed Care - HMO

## 2013-09-12 ENCOUNTER — Ambulatory Visit (HOSPITAL_BASED_OUTPATIENT_CLINIC_OR_DEPARTMENT_OTHER): Payer: Commercial Managed Care - HMO

## 2013-09-12 ENCOUNTER — Other Ambulatory Visit: Payer: Medicare HMO

## 2013-09-12 ENCOUNTER — Telehealth: Payer: Self-pay | Admitting: Hematology and Oncology

## 2013-09-12 ENCOUNTER — Ambulatory Visit: Payer: Medicare HMO

## 2013-09-12 VITALS — BP 100/64 | HR 84 | Temp 98.1°F

## 2013-09-12 DIAGNOSIS — D704 Cyclic neutropenia: Principal | ICD-10-CM

## 2013-09-12 DIAGNOSIS — M359 Systemic involvement of connective tissue, unspecified: Secondary | ICD-10-CM

## 2013-09-12 DIAGNOSIS — D708 Other neutropenia: Secondary | ICD-10-CM

## 2013-09-12 DIAGNOSIS — M0500 Felty's syndrome, unspecified site: Secondary | ICD-10-CM

## 2013-09-12 DIAGNOSIS — D649 Anemia, unspecified: Secondary | ICD-10-CM

## 2013-09-12 DIAGNOSIS — D696 Thrombocytopenia, unspecified: Secondary | ICD-10-CM

## 2013-09-12 LAB — CBC WITH DIFFERENTIAL/PLATELET
BASO%: 1.9 % (ref 0.0–2.0)
Basophils Absolute: 0 10*3/uL (ref 0.0–0.1)
EOS%: 1.9 % (ref 0.0–7.0)
Eosinophils Absolute: 0 10*3/uL (ref 0.0–0.5)
HCT: 31.5 % — ABNORMAL LOW (ref 34.8–46.6)
HGB: 10.3 g/dL — ABNORMAL LOW (ref 11.6–15.9)
LYMPH#: 0.3 10*3/uL — AB (ref 0.9–3.3)
LYMPH%: 51.9 % — ABNORMAL HIGH (ref 14.0–49.7)
MCH: 29.8 pg (ref 25.1–34.0)
MCHC: 32.7 g/dL (ref 31.5–36.0)
MCV: 91 fL (ref 79.5–101.0)
MONO#: 0.1 10*3/uL (ref 0.1–0.9)
MONO%: 15.4 % — AB (ref 0.0–14.0)
NEUT#: 0.2 10*3/uL — CL (ref 1.5–6.5)
NEUT%: 28.9 % — ABNORMAL LOW (ref 38.4–76.8)
NRBC: 0 % (ref 0–0)
Platelets: 82 10*3/uL — ABNORMAL LOW (ref 145–400)
RBC: 3.46 10*6/uL — AB (ref 3.70–5.45)
RDW: 16.2 % — ABNORMAL HIGH (ref 11.2–14.5)
WBC: 0.5 10*3/uL — CL (ref 3.9–10.3)

## 2013-09-12 MED ORDER — TBO-FILGRASTIM 480 MCG/0.8ML ~~LOC~~ SOSY
480.0000 ug | PREFILLED_SYRINGE | Freq: Once | SUBCUTANEOUS | Status: AC
  Administered 2013-09-12: 480 ug via SUBCUTANEOUS
  Filled 2013-09-12: qty 0.8

## 2013-09-12 NOTE — Telephone Encounter (Signed)
pt came in and early added appts now.

## 2013-09-12 NOTE — Telephone Encounter (Signed)
gv and printed aptp sched and avs for pt for April.... °

## 2013-09-13 ENCOUNTER — Ambulatory Visit (HOSPITAL_BASED_OUTPATIENT_CLINIC_OR_DEPARTMENT_OTHER): Payer: Commercial Managed Care - HMO

## 2013-09-13 VITALS — BP 116/50 | HR 86 | Temp 98.4°F

## 2013-09-13 DIAGNOSIS — D708 Other neutropenia: Secondary | ICD-10-CM

## 2013-09-13 DIAGNOSIS — D704 Cyclic neutropenia: Principal | ICD-10-CM

## 2013-09-13 DIAGNOSIS — M359 Systemic involvement of connective tissue, unspecified: Secondary | ICD-10-CM

## 2013-09-13 MED ORDER — TBO-FILGRASTIM 480 MCG/0.8ML ~~LOC~~ SOSY
480.0000 ug | PREFILLED_SYRINGE | Freq: Once | SUBCUTANEOUS | Status: AC
  Administered 2013-09-13: 480 ug via SUBCUTANEOUS
  Filled 2013-09-13: qty 0.8

## 2013-09-14 ENCOUNTER — Ambulatory Visit (HOSPITAL_BASED_OUTPATIENT_CLINIC_OR_DEPARTMENT_OTHER): Payer: Commercial Managed Care - HMO

## 2013-09-14 VITALS — BP 95/55 | HR 88 | Temp 98.6°F

## 2013-09-14 DIAGNOSIS — D704 Cyclic neutropenia: Principal | ICD-10-CM

## 2013-09-14 DIAGNOSIS — D708 Other neutropenia: Secondary | ICD-10-CM

## 2013-09-14 DIAGNOSIS — M359 Systemic involvement of connective tissue, unspecified: Secondary | ICD-10-CM

## 2013-09-14 MED ORDER — TBO-FILGRASTIM 480 MCG/0.8ML ~~LOC~~ SOSY
480.0000 ug | PREFILLED_SYRINGE | Freq: Once | SUBCUTANEOUS | Status: AC
  Administered 2013-09-14: 480 ug via SUBCUTANEOUS
  Filled 2013-09-14: qty 0.8

## 2013-09-15 ENCOUNTER — Other Ambulatory Visit (HOSPITAL_BASED_OUTPATIENT_CLINIC_OR_DEPARTMENT_OTHER): Payer: Medicare HMO

## 2013-09-15 ENCOUNTER — Telehealth: Payer: Self-pay | Admitting: Hematology and Oncology

## 2013-09-15 ENCOUNTER — Other Ambulatory Visit: Payer: Self-pay | Admitting: *Deleted

## 2013-09-15 ENCOUNTER — Ambulatory Visit (HOSPITAL_BASED_OUTPATIENT_CLINIC_OR_DEPARTMENT_OTHER): Payer: Medicare HMO

## 2013-09-15 VITALS — BP 100/63 | HR 92 | Temp 98.1°F

## 2013-09-15 DIAGNOSIS — D704 Cyclic neutropenia: Secondary | ICD-10-CM

## 2013-09-15 DIAGNOSIS — M359 Systemic involvement of connective tissue, unspecified: Secondary | ICD-10-CM

## 2013-09-15 DIAGNOSIS — D708 Other neutropenia: Secondary | ICD-10-CM

## 2013-09-15 DIAGNOSIS — M0500 Felty's syndrome, unspecified site: Secondary | ICD-10-CM

## 2013-09-15 LAB — CBC WITH DIFFERENTIAL/PLATELET
BASO%: 0 % (ref 0.0–2.0)
Basophils Absolute: 0 10*3/uL (ref 0.0–0.1)
EOS%: 1 % (ref 0.0–7.0)
Eosinophils Absolute: 0 10*3/uL (ref 0.0–0.5)
HCT: 29 % — ABNORMAL LOW (ref 34.8–46.6)
HGB: 9.7 g/dL — ABNORMAL LOW (ref 11.6–15.9)
LYMPH#: 1.1 10*3/uL (ref 0.9–3.3)
LYMPH%: 85.7 % — AB (ref 14.0–49.7)
MCH: 30.4 pg (ref 25.1–34.0)
MCHC: 33.4 g/dL (ref 31.5–36.0)
MCV: 90.9 fL (ref 79.5–101.0)
MONO#: 0.2 10*3/uL (ref 0.1–0.9)
MONO%: 12.1 % (ref 0.0–14.0)
NEUT%: 1.2 % — ABNORMAL LOW (ref 38.4–76.8)
NEUTROS ABS: 0 10*3/uL — AB (ref 1.5–6.5)
PLATELETS: 68 10*3/uL — AB (ref 145–400)
RBC: 3.2 10*6/uL — AB (ref 3.70–5.45)
RDW: 15.9 % — AB (ref 11.2–14.5)
WBC: 1.3 10*3/uL — ABNORMAL LOW (ref 3.9–10.3)

## 2013-09-15 MED ORDER — TBO-FILGRASTIM 480 MCG/0.8ML ~~LOC~~ SOSY
480.0000 ug | PREFILLED_SYRINGE | Freq: Once | SUBCUTANEOUS | Status: AC
  Administered 2013-09-15: 480 ug via SUBCUTANEOUS
  Filled 2013-09-15: qty 0.8

## 2013-09-15 NOTE — Progress Notes (Signed)
1. Stop Methotrexate.  2. If you have a flare up of Rheumatoid Arthritis then double your current Prednisone dose. 3. Continue daily Granix injections next week Monday thru Friday and keep appt as scheduled to see Dr. Alvy Bimler on Friday 4/17.    Above instructions given to pt per Dr. Alvy Bimler.  Pt verbalized understanding but states she cannot make appts next week Tues thru Sunday.  She will be taking care of her mother who is having surgery.  She states she is unable to change these plans.  Per Dr. Alvy Bimler have pt come in this Sat and Monday for Granix and add lab for Monday 4/13.  R/s lab/office visit to following Monday 4/20 when pt will be back in town.   Pt agrees/understands new instructions.

## 2013-09-15 NOTE — Telephone Encounter (Signed)
gv adn printed apt sched and avs for pt for April °

## 2013-09-16 ENCOUNTER — Ambulatory Visit (HOSPITAL_BASED_OUTPATIENT_CLINIC_OR_DEPARTMENT_OTHER): Payer: Medicare HMO

## 2013-09-16 VITALS — BP 92/52 | HR 98 | Temp 98.8°F | Resp 20

## 2013-09-16 DIAGNOSIS — D708 Other neutropenia: Secondary | ICD-10-CM

## 2013-09-16 DIAGNOSIS — D704 Cyclic neutropenia: Principal | ICD-10-CM

## 2013-09-16 DIAGNOSIS — M359 Systemic involvement of connective tissue, unspecified: Secondary | ICD-10-CM

## 2013-09-16 MED ORDER — TBO-FILGRASTIM 480 MCG/0.8ML ~~LOC~~ SOSY
480.0000 ug | PREFILLED_SYRINGE | Freq: Once | SUBCUTANEOUS | Status: AC
  Administered 2013-09-16: 480 ug via SUBCUTANEOUS
  Filled 2013-09-16: qty 0.8

## 2013-09-16 NOTE — Patient Instructions (Signed)
Tbo-Filgrastim injection What is this medicine? TBO-FILGRASTIM is used to help decrease the time you have low amounts of white blood cells after cancer treatment. It helps the body make more white blood cells. Increasing the amount of white blood cells helps to decrease the risk of infection and fever. This medicine may be used for other purposes; ask your health care provider or pharmacist if you have questions. COMMON BRAND NAME(S): Granix What should I tell my health care provider before I take this medicine? They need to know if you have any of these conditions: -history of blood diseases, like sickle cell anemia or leukemia -an unusual or allergic reaction to tbo-filgrastim, filgrastim, pegfilgrastim, other medicines, foods, dyes, or preservatives -pregnant or trying to get pregnant -breast-feeding How should I use this medicine? This medicine is for injection under the skin. It is given by a health care professional in a hospital or clinic setting. Talk to your pediatrician regarding the use of this medicine in children. Special care may be needed. Overdosage: If you think you've taken too much of this medicine contact a poison control center or emergency room at once. Overdosage: If you think you have taken too much of this medicine contact a poison control center or emergency room at once. NOTE: This medicine is only for you. Do not share this medicine with others. What if I miss a dose? Keep appointments for follow-up doses as directed. It is important not to miss your dose. Call your doctor or health care professional if you are unable to keep an appointment. What may interact with this medicine? -lithium This list may not describe all possible interactions. Give your health care provider a list of all the medicines, herbs, non-prescription drugs, or dietary supplements you use. Also tell them if you smoke, drink alcohol, or use illegal drugs. Some items may interact with your  medicine. What should I watch for while using this medicine? Your condition will be monitored carefully while you are receiving this medicine. You may need blood work done while you are taking this medicine. Avoid taking products that contain aspirin, acetaminophen, ibuprofen, naproxen, or ketoprofen unless instructed by your doctor. These medicines may hide a fever. Call your doctor or health care professional for advice if you get a fever, chills or sore throat, or other symptoms of a cold or flu. Do not treat yourself. What side effects may I notice from receiving this medicine? Side effects that you should report to your doctor or health care professional as soon as possible: -allergic reactions like skin rash, itching or hives, swelling of the face, lips, or tongue -breathing problems -fever -stomach pain  Side effects that usually do not require medical attention (Report these to your doctor or health care professional if they continue or are bothersome.): -bone pain This list may not describe all possible side effects. Call your doctor for medical advice about side effects. You may report side effects to FDA at 1-800-FDA-1088. Where should I keep my medicine? This drug is given in a hospital or clinic and will not be stored at home. NOTE: This sheet is a summary. It may not cover all possible information. If you have questions about this medicine, talk to your doctor, pharmacist, or health care provider.  2014, [employer]vier/Gold Standard. (2011-02-12 16:41:24)  

## 2013-09-17 ENCOUNTER — Ambulatory Visit (HOSPITAL_BASED_OUTPATIENT_CLINIC_OR_DEPARTMENT_OTHER): Payer: Commercial Managed Care - HMO

## 2013-09-17 VITALS — BP 98/56 | HR 90 | Temp 98.0°F | Resp 18

## 2013-09-17 DIAGNOSIS — D708 Other neutropenia: Secondary | ICD-10-CM

## 2013-09-17 MED ORDER — TBO-FILGRASTIM 480 MCG/0.8ML ~~LOC~~ SOSY
480.0000 ug | PREFILLED_SYRINGE | Freq: Once | SUBCUTANEOUS | Status: AC
  Administered 2013-09-17: 480 ug via SUBCUTANEOUS

## 2013-09-17 NOTE — Patient Instructions (Signed)
Tbo-Filgrastim injection What is this medicine? TBO-FILGRASTIM is used to help decrease the time you have low amounts of white blood cells after cancer treatment. It helps the body make more white blood cells. Increasing the amount of white blood cells helps to decrease the risk of infection and fever. This medicine may be used for other purposes; ask your health care provider or pharmacist if you have questions. COMMON BRAND NAME(S): Granix What should I tell my health care provider before I take this medicine? They need to know if you have any of these conditions: -history of blood diseases, like sickle cell anemia or leukemia -an unusual or allergic reaction to tbo-filgrastim, filgrastim, pegfilgrastim, other medicines, foods, dyes, or preservatives -pregnant or trying to get pregnant -breast-feeding How should I use this medicine? This medicine is for injection under the skin. It is given by a health care professional in a hospital or clinic setting. Talk to your pediatrician regarding the use of this medicine in children. Special care may be needed. Overdosage: If you think you've taken too much of this medicine contact a poison control center or emergency room at once. Overdosage: If you think you have taken too much of this medicine contact a poison control center or emergency room at once. NOTE: This medicine is only for you. Do not share this medicine with others. What if I miss a dose? Keep appointments for follow-up doses as directed. It is important not to miss your dose. Call your doctor or health care professional if you are unable to keep an appointment. What may interact with this medicine? -lithium This list may not describe all possible interactions. Give your health care provider a list of all the medicines, herbs, non-prescription drugs, or dietary supplements you use. Also tell them if you smoke, drink alcohol, or use illegal drugs. Some items may interact with your  medicine. What should I watch for while using this medicine? Your condition will be monitored carefully while you are receiving this medicine. You may need blood work done while you are taking this medicine. Avoid taking products that contain aspirin, acetaminophen, ibuprofen, naproxen, or ketoprofen unless instructed by your doctor. These medicines may hide a fever. Call your doctor or health care professional for advice if you get a fever, chills or sore throat, or other symptoms of a cold or flu. Do not treat yourself. What side effects may I notice from receiving this medicine? Side effects that you should report to your doctor or health care professional as soon as possible: -allergic reactions like skin rash, itching or hives, swelling of the face, lips, or tongue -breathing problems -fever -stomach pain  Side effects that usually do not require medical attention (Report these to your doctor or health care professional if they continue or are bothersome.): -bone pain This list may not describe all possible side effects. Call your doctor for medical advice about side effects. You may report side effects to FDA at 1-800-FDA-1088. Where should I keep my medicine? This drug is given in a hospital or clinic and will not be stored at home. NOTE: This sheet is a summary. It may not cover all possible information. If you have questions about this medicine, talk to your doctor, pharmacist, or health care provider.  2014, [employer]vier/Gold Standard. (2011-02-12 16:41:24)  

## 2013-09-19 ENCOUNTER — Other Ambulatory Visit: Payer: Self-pay | Admitting: Hematology and Oncology

## 2013-09-19 ENCOUNTER — Encounter (HOSPITAL_BASED_OUTPATIENT_CLINIC_OR_DEPARTMENT_OTHER): Payer: Medicare HMO | Attending: Plastic Surgery

## 2013-09-19 ENCOUNTER — Other Ambulatory Visit (HOSPITAL_BASED_OUTPATIENT_CLINIC_OR_DEPARTMENT_OTHER): Payer: Commercial Managed Care - HMO

## 2013-09-19 ENCOUNTER — Ambulatory Visit (HOSPITAL_BASED_OUTPATIENT_CLINIC_OR_DEPARTMENT_OTHER): Payer: Commercial Managed Care - HMO

## 2013-09-19 VITALS — BP 122/71 | HR 83 | Temp 97.8°F

## 2013-09-19 DIAGNOSIS — M359 Systemic involvement of connective tissue, unspecified: Secondary | ICD-10-CM

## 2013-09-19 DIAGNOSIS — D708 Other neutropenia: Secondary | ICD-10-CM

## 2013-09-19 DIAGNOSIS — L97809 Non-pressure chronic ulcer of other part of unspecified lower leg with unspecified severity: Secondary | ICD-10-CM | POA: Insufficient documentation

## 2013-09-19 DIAGNOSIS — D704 Cyclic neutropenia: Principal | ICD-10-CM

## 2013-09-19 DIAGNOSIS — D649 Anemia, unspecified: Secondary | ICD-10-CM

## 2013-09-19 DIAGNOSIS — M0500 Felty's syndrome, unspecified site: Secondary | ICD-10-CM

## 2013-09-19 LAB — COMPREHENSIVE METABOLIC PANEL (CC13)
ALBUMIN: 3.2 g/dL — AB (ref 3.5–5.0)
ALT: 9 U/L (ref 0–55)
ANION GAP: 9 meq/L (ref 3–11)
AST: 18 U/L (ref 5–34)
Alkaline Phosphatase: 50 U/L (ref 40–150)
BILIRUBIN TOTAL: 0.69 mg/dL (ref 0.20–1.20)
BUN: 6 mg/dL — ABNORMAL LOW (ref 7.0–26.0)
CO2: 28 meq/L (ref 22–29)
Calcium: 8.5 mg/dL (ref 8.4–10.4)
Chloride: 101 mEq/L (ref 98–109)
Creatinine: 0.8 mg/dL (ref 0.6–1.1)
Glucose: 121 mg/dl (ref 70–140)
POTASSIUM: 3.3 meq/L — AB (ref 3.5–5.1)
SODIUM: 138 meq/L (ref 136–145)
TOTAL PROTEIN: 6.6 g/dL (ref 6.4–8.3)

## 2013-09-19 LAB — CBC WITH DIFFERENTIAL/PLATELET
BASO%: 3.7 % — ABNORMAL HIGH (ref 0.0–2.0)
Basophils Absolute: 0 10*3/uL (ref 0.0–0.1)
EOS%: 0 % (ref 0.0–7.0)
Eosinophils Absolute: 0 10*3/uL (ref 0.0–0.5)
HCT: 27.9 % — ABNORMAL LOW (ref 34.8–46.6)
HEMOGLOBIN: 9.1 g/dL — AB (ref 11.6–15.9)
LYMPH#: 0.3 10*3/uL — AB (ref 0.9–3.3)
LYMPH%: 27.8 % (ref 14.0–49.7)
MCH: 29.7 pg (ref 25.1–34.0)
MCHC: 32.6 g/dL (ref 31.5–36.0)
MCV: 91.2 fL (ref 79.5–101.0)
MONO#: 0.2 10*3/uL (ref 0.1–0.9)
MONO%: 16.7 % — ABNORMAL HIGH (ref 0.0–14.0)
NEUT#: 0.6 10*3/uL — ABNORMAL LOW (ref 1.5–6.5)
NEUT%: 51.8 % (ref 38.4–76.8)
PLATELETS: 70 10*3/uL — AB (ref 145–400)
RBC: 3.06 10*6/uL — ABNORMAL LOW (ref 3.70–5.45)
RDW: 17.2 % — ABNORMAL HIGH (ref 11.2–14.5)
WBC: 1.1 10*3/uL — ABNORMAL LOW (ref 3.9–10.3)
nRBC: 5 % — ABNORMAL HIGH (ref 0–0)

## 2013-09-19 LAB — LACTATE DEHYDROGENASE (CC13): LDH: 214 U/L (ref 125–245)

## 2013-09-19 LAB — HEPATITIS B CORE ANTIBODY, IGM: Hep B C IgM: NONREACTIVE

## 2013-09-19 LAB — HEPATITIS B SURFACE ANTIGEN: HEP B S AG: NEGATIVE

## 2013-09-19 LAB — HEPATITIS B SURFACE ANTIBODY,QUALITATIVE: Hep B S Ab: NEGATIVE

## 2013-09-19 MED ORDER — TBO-FILGRASTIM 480 MCG/0.8ML ~~LOC~~ SOSY
480.0000 ug | PREFILLED_SYRINGE | Freq: Once | SUBCUTANEOUS | Status: AC
  Administered 2013-09-19: 480 ug via SUBCUTANEOUS
  Filled 2013-09-19: qty 0.8

## 2013-09-20 ENCOUNTER — Ambulatory Visit (HOSPITAL_BASED_OUTPATIENT_CLINIC_OR_DEPARTMENT_OTHER): Payer: Medicare HMO

## 2013-09-20 VITALS — BP 108/55 | HR 88 | Temp 98.5°F

## 2013-09-20 DIAGNOSIS — D704 Cyclic neutropenia: Principal | ICD-10-CM

## 2013-09-20 DIAGNOSIS — M359 Systemic involvement of connective tissue, unspecified: Secondary | ICD-10-CM

## 2013-09-20 DIAGNOSIS — D708 Other neutropenia: Secondary | ICD-10-CM

## 2013-09-20 MED ORDER — TBO-FILGRASTIM 480 MCG/0.8ML ~~LOC~~ SOSY
480.0000 ug | PREFILLED_SYRINGE | Freq: Once | SUBCUTANEOUS | Status: AC
  Administered 2013-09-20: 480 ug via SUBCUTANEOUS
  Filled 2013-09-20: qty 0.8

## 2013-09-20 NOTE — Progress Notes (Signed)
[facility]  NAME:  [name], [name]            ACCOUNT NO.:  [id number]  MEDICAL RECORD NO.:  [id number]      DATE OF BIRTH:  1950-08-23  PHYSICIAN:  Theodoro Kos, DO       VISIT DATE:  09/19/2013                                  OFFICE VISIT   HISTORY:  The patient is a 63 year old female who is here for evaluation of her right leg ulcer.  She is not really sure how she got it, but she was seen by Dr. Hulen Skains and referred for further evaluation.  She has been using triple antibiotic ointment on the area.  PAST MEDICAL HISTORY:  Positive for anemia, rheumatoid arthritis, osteoarthritis, and fibromyalgia.  MEDICATIONS:  Her medications include Magic mouthwash, vitamin C, Os- Cal, vitamin B12, Flexeril, Cymbalta, Voltaren, Norco, melatonin, Pamelor, Prilosec, prednisone, Lyrica, and probiotics.  ALLERGIES:  She is allergic to penicillin.  REVIEW OF SYSTEMS:  Otherwise, negative.  SOCIAL HISTORY:  She lives at home.  She is not a smoker at current, but was in the past.  OBJECTIVE:  GENERAL:  She is alert and oriented, cooperative, not in any acute distress.  She is pleasant. HEENT:  Pupils are equal.  Extraocular muscles are intact. NECK:  No cervical lymphadenopathy. RESPIRATORY:  Her breathing is unlabored. CARDIOVASCULAR:  Her heart rate is regular.  The wound was debrided sharply and those notes are noted in the chart. It is located on the anterior portion of her right leg.  She does have a very good pulse.  She has significant venous insufficiency noted superficially at least with varicose veins.  RECOMMENDATIONS:  Recommendation is for wet-to-dry dressing change, elevation, multivitamin, vitamin C, zinc.  We will check a pre-albumin, also start vitamin A, and will have her follow up in 1 week.     Theodoro Kos, DO     CS/MEDQ  D:  09/19/2013  T:  09/20/2013  Job:  [id number]

## 2013-09-21 ENCOUNTER — Ambulatory Visit (HOSPITAL_BASED_OUTPATIENT_CLINIC_OR_DEPARTMENT_OTHER): Payer: Medicare HMO

## 2013-09-21 VITALS — BP 109/63 | HR 96 | Temp 98.9°F

## 2013-09-21 DIAGNOSIS — M359 Systemic involvement of connective tissue, unspecified: Secondary | ICD-10-CM

## 2013-09-21 DIAGNOSIS — D704 Cyclic neutropenia: Principal | ICD-10-CM

## 2013-09-21 DIAGNOSIS — D708 Other neutropenia: Secondary | ICD-10-CM

## 2013-09-21 MED ORDER — TBO-FILGRASTIM 480 MCG/0.8ML ~~LOC~~ SOSY
480.0000 ug | PREFILLED_SYRINGE | Freq: Once | SUBCUTANEOUS | Status: AC
  Administered 2013-09-21: 480 ug via SUBCUTANEOUS
  Filled 2013-09-21: qty 0.8

## 2013-09-23 ENCOUNTER — Ambulatory Visit: Payer: Medicare HMO | Admitting: Hematology and Oncology

## 2013-09-23 ENCOUNTER — Other Ambulatory Visit: Payer: Self-pay | Admitting: Hematology and Oncology

## 2013-09-23 ENCOUNTER — Other Ambulatory Visit: Payer: Medicare HMO

## 2013-09-23 DIAGNOSIS — D704 Cyclic neutropenia: Principal | ICD-10-CM

## 2013-09-23 DIAGNOSIS — M359 Systemic involvement of connective tissue, unspecified: Secondary | ICD-10-CM

## 2013-09-26 ENCOUNTER — Telehealth: Payer: Self-pay | Admitting: Hematology and Oncology

## 2013-09-26 ENCOUNTER — Ambulatory Visit (HOSPITAL_BASED_OUTPATIENT_CLINIC_OR_DEPARTMENT_OTHER): Payer: Medicare HMO | Admitting: Hematology and Oncology

## 2013-09-26 ENCOUNTER — Other Ambulatory Visit (HOSPITAL_BASED_OUTPATIENT_CLINIC_OR_DEPARTMENT_OTHER): Payer: Medicare HMO

## 2013-09-26 VITALS — BP 106/62 | HR 88 | Temp 98.3°F | Resp 18 | Ht 62.0 in | Wt 161.0 lb

## 2013-09-26 DIAGNOSIS — D649 Anemia, unspecified: Secondary | ICD-10-CM

## 2013-09-26 DIAGNOSIS — M359 Systemic involvement of connective tissue, unspecified: Secondary | ICD-10-CM

## 2013-09-26 DIAGNOSIS — M069 Rheumatoid arthritis, unspecified: Secondary | ICD-10-CM

## 2013-09-26 DIAGNOSIS — D708 Other neutropenia: Secondary | ICD-10-CM

## 2013-09-26 DIAGNOSIS — D6959 Other secondary thrombocytopenia: Secondary | ICD-10-CM

## 2013-09-26 DIAGNOSIS — D704 Cyclic neutropenia: Principal | ICD-10-CM

## 2013-09-26 DIAGNOSIS — L97809 Non-pressure chronic ulcer of other part of unspecified lower leg with unspecified severity: Secondary | ICD-10-CM | POA: Diagnosis not present

## 2013-09-26 LAB — CBC WITH DIFFERENTIAL/PLATELET
BASO%: 1.5 % (ref 0.0–2.0)
Basophils Absolute: 0 10*3/uL (ref 0.0–0.1)
EOS%: 1.1 % (ref 0.0–7.0)
Eosinophils Absolute: 0 10*3/uL (ref 0.0–0.5)
HCT: 29.3 % — ABNORMAL LOW (ref 34.8–46.6)
HGB: 9.1 g/dL — ABNORMAL LOW (ref 11.6–15.9)
LYMPH#: 1.2 10*3/uL (ref 0.9–3.3)
LYMPH%: 46.1 % (ref 14.0–49.7)
MCH: 29.4 pg (ref 25.1–34.0)
MCHC: 31.1 g/dL — AB (ref 31.5–36.0)
MCV: 94.8 fL (ref 79.5–101.0)
MONO#: 0.4 10*3/uL (ref 0.1–0.9)
MONO%: 14.9 % — ABNORMAL HIGH (ref 0.0–14.0)
NEUT#: 1 10*3/uL — ABNORMAL LOW (ref 1.5–6.5)
NEUT%: 36.4 % — ABNORMAL LOW (ref 38.4–76.8)
Platelets: 138 10*3/uL — ABNORMAL LOW (ref 145–400)
RBC: 3.09 10*6/uL — AB (ref 3.70–5.45)
RDW: 17.8 % — ABNORMAL HIGH (ref 11.2–14.5)
WBC: 2.7 10*3/uL — AB (ref 3.9–10.3)

## 2013-09-26 MED ORDER — TBO-FILGRASTIM 480 MCG/0.8ML ~~LOC~~ SOSY
480.0000 ug | PREFILLED_SYRINGE | Freq: Every day | SUBCUTANEOUS | Status: DC
  Administered 2013-09-26: 480 ug via SUBCUTANEOUS
  Filled 2013-09-26: qty 0.8

## 2013-09-26 NOTE — Telephone Encounter (Signed)
gv and printed appt sched and avs for pt for April  °

## 2013-09-26 NOTE — Patient Instructions (Signed)
Tbo-Filgrastim injection What is this medicine? TBO-FILGRASTIM is used to help decrease the time you have low amounts of white blood cells after cancer treatment. It helps the body make more white blood cells. Increasing the amount of white blood cells helps to decrease the risk of infection and fever. This medicine may be used for other purposes; ask your health care provider or pharmacist if you have questions. COMMON BRAND NAME(S): Granix What should I tell my health care provider before I take this medicine? They need to know if you have any of these conditions: -history of blood diseases, like sickle cell anemia or leukemia -an unusual or allergic reaction to tbo-filgrastim, filgrastim, pegfilgrastim, other medicines, foods, dyes, or preservatives -pregnant or trying to get pregnant -breast-feeding How should I use this medicine? This medicine is for injection under the skin. It is given by a health care professional in a hospital or clinic setting. Talk to your pediatrician regarding the use of this medicine in children. Special care may be needed. Overdosage: If you think you've taken too much of this medicine contact a poison control center or emergency room at once. Overdosage: If you think you have taken too much of this medicine contact a poison control center or emergency room at once. NOTE: This medicine is only for you. Do not share this medicine with others. What if I miss a dose? Keep appointments for follow-up doses as directed. It is important not to miss your dose. Call your doctor or health care professional if you are unable to keep an appointment. What may interact with this medicine? -lithium This list may not describe all possible interactions. Give your health care provider a list of all the medicines, herbs, non-prescription drugs, or dietary supplements you use. Also tell them if you smoke, drink alcohol, or use illegal drugs. Some items may interact with your  medicine. What should I watch for while using this medicine? Your condition will be monitored carefully while you are receiving this medicine. You may need blood work done while you are taking this medicine. Avoid taking products that contain aspirin, acetaminophen, ibuprofen, naproxen, or ketoprofen unless instructed by your doctor. These medicines may hide a fever. Call your doctor or health care professional for advice if you get a fever, chills or sore throat, or other symptoms of a cold or flu. Do not treat yourself. What side effects may I notice from receiving this medicine? Side effects that you should report to your doctor or health care professional as soon as possible: -allergic reactions like skin rash, itching or hives, swelling of the face, lips, or tongue -breathing problems -fever -stomach pain  Side effects that usually do not require medical attention (Report these to your doctor or health care professional if they continue or are bothersome.): -bone pain This list may not describe all possible side effects. Call your doctor for medical advice about side effects. You may report side effects to FDA at 1-800-FDA-1088. Where should I keep my medicine? This drug is given in a hospital or clinic and will not be stored at home. NOTE: This sheet is a summary. It may not cover all possible information. If you have questions about this medicine, talk to your doctor, pharmacist, or health care provider.  2014, [employer]vier/Gold Standard. (2011-02-12 16:41:24)  

## 2013-09-26 NOTE — Progress Notes (Signed)
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 Bone marrow aspirate and biopsy was performed in 2002 and 2004 with no abnormalities found. The patient was treated with G-CSF with good response. She was subsequently started on methotrexate weekly along with prednisone.  On 09/09/2013, methotrexate was discontinued due to severe neutropenia. She was started on G-CSF injection daily. #2 history of anemia The patient had history of GI bleed in 2012 are negative endoscopy and colonoscopy.  INTERVAL HISTORY: [name] 63 y.o. female returns for further followup. 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 She has appointment to go to the wound care clinic. Her nasal drainage and nonproductive cough has resolved. Overall, she appears to be doing well on G-CSF injections. She denies any flare of rheumatoid arthritis  I have reviewed the past medical history, past surgical history, social history and family history with the patient and they are unchanged from previous note.  ALLERGIES:  is allergic to penicillins.  MEDICATIONS:  Current Outpatient Prescriptions  Medication Sig Dispense Refill  . vitamin A 10000 UNIT capsule Take 10,000 Units by mouth daily.      Marland Kitchen zinc gluconate 50 MG tablet Take 100 mg by mouth daily.      . Alum & Mag Hydroxide-Simeth (MAGIC MOUTHWASH W/LIDOCAINE) SOLN Take 5 mLs by mouth 3 (three) times daily as needed.  240 mL  0  . Ascorbic Acid (VITAMIN C) 1000 MG tablet Take 1,000 mg by mouth daily.      . calcium  carbonate (CALCIUM 600) 600 MG TABS Take 600 mg by mouth 2 (two) times daily with a meal.        . Cholecalciferol (VITAMIN D) 1000 UNITS capsule Take 1,000 Units by mouth daily.       . cyclobenzaprine (FLEXERIL) 10 MG tablet Take 1 tablet (10 mg total) by mouth 3 (three) times daily as needed for muscle spasms.  90 tablet  2  . diclofenac sodium (VOLTAREN) 1 % GEL Apply 2 g topically 4 (four) times daily.  3 Tube  2  . DULoxetine (CYMBALTA) 60 MG capsule TAKE ONE CAPSULE BY MOUTH TWICE DAILY  180 capsule  3  . folic acid (FOLVITE) 1 MG tablet Take 1 mg by mouth daily.      Marland Kitchen HYDROcodone-acetaminophen (NORCO) 7.5-325 MG per tablet Take 1 tablet by mouth every 4 (four) hours as needed. Max 6 per day  180 tablet  0  . Melatonin 10 MG CAPS Take 1 tablet by mouth at bedtime.      . nortriptyline (PAMELOR) 50 MG capsule TAKE TWO CAPSULES BY MOUTH AT BEDTIME  60 capsule  0  . omeprazole (PRILOSEC) 20 MG capsule Take 20 mg by mouth 2 (two) times daily.       . predniSONE (DELTASONE) 10 MG tablet TAKE DAILY BY MOUTH AS DIRECTED BY PHYSICIAN.  60 tablet  3  . pregabalin (LYRICA) 200 MG capsule TAKE 1 CAPSULE BY MOUTH TWICE DAILY  60 capsule  0  . Probiotic Product (PROBIOTIC FORMULA) CAPS Take 1 capsule by mouth daily.        . vitamin B-12 (CYANOCOBALAMIN) 1000 MCG tablet  Take 1,000 mcg by mouth every other day.       . [DISCONTINUED] POTASSIUM PO Take 20 mEq by mouth 2 (two) times daily.       Current Facility-Administered Medications  Medication Dose Route Frequency Provider Last Rate Last Dose  . Tbo-Filgrastim (GRANIX) injection 480 mcg  480 mcg Subcutaneous Daily Heath Lark, MD   480 mcg at 09/26/13 1022     REVIEW OF SYSTEMS:   Constitutional: Denies fevers, chills or night sweats Eyes: Denies blurriness of vision Ears, nose, mouth, throat, and face: Denies mucositis or sore throat Respiratory: Denies cough, dyspnea or wheezes Cardiovascular: Denies palpitation, chest discomfort or lower  extremity swelling Gastrointestinal:  Denies nausea, heartburn or change in bowel habits Skin: Denies abnormal skin rashes Lymphatics: Denies new lymphadenopathy or easy bruising Neurological:Denies numbness, tingling or new weaknesses Behavioral/Psych: Mood is stable, no new changes  All other systems were reviewed with the patient and are negative.  PHYSICAL EXAMINATION: ECOG PERFORMANCE STATUS: 0 - Asymptomatic  Filed Vitals:   09/26/13 0953  BP: 106/62  Pulse: 88  Temp: 98.3 F (36.8 C)  Resp: 18   Filed Weights   09/26/13 0953  Weight: 161 lb (73.029 kg)    GENERAL:alert, no distress and comfortable SKIN: skin color, texture, turgor are normal, no rashes or significant lesions EYES: normal, Conjunctiva are pink and non-injected, sclera clear OROPHARYNX:no exudate, no erythema and lips, buccal mucosa, and tongue normal  NECK: supple, thyroid normal size, non-tender, without nodularity LYMPH:  no palpable lymphadenopathy in the cervical, axillary or inguinal LUNGS: clear to auscultation and percussion with normal breathing effort HEART: regular rate & rhythm and no murmurs and no lower extremity edema ABDOMEN:abdomen soft, non-tender and normal bowel sounds Musculoskeletal:no cyanosis of digits and no clubbing . She had chronic degenerative joint changes, unchanged compared to previous visit NEURO: alert & oriented x 3 with fluent speech, no focal motor/sensory deficits  LABORATORY DATA:  I have reviewed the data as listed Results for orders placed in visit on 09/26/13 (from the past 48 hour(s))  CBC WITH DIFFERENTIAL     Status: Abnormal   Collection Time    09/26/13  9:32 AM      Result Value Ref Range   WBC 2.7 (*) 3.9 - 10.3 10e3/uL   NEUT# 1.0 (*) 1.5 - 6.5 10e3/uL   HGB 9.1 (*) 11.6 - 15.9 g/dL   HCT 29.3 (*) 34.8 - 46.6 %   Platelets 138 (*) 145 - 400 10e3/uL   MCV 94.8  79.5 - 101.0 fL   MCH 29.4  25.1 - 34.0 pg   MCHC 31.1 (*) 31.5 - 36.0 g/dL   RBC 3.09  (*) 3.70 - 5.45 10e6/uL   RDW 17.8 (*) 11.2 - 14.5 %   lymph# 1.2  0.9 - 3.3 10e3/uL   MONO# 0.4  0.1 - 0.9 10e3/uL   Eosinophils Absolute 0.0  0.0 - 0.5 10e3/uL   Basophils Absolute 0.0  0.0 - 0.1 10e3/uL   NEUT% 36.4 (*) 38.4 - 76.8 %   LYMPH% 46.1  14.0 - 49.7 %   MONO% 14.9 (*) 0.0 - 14.0 %   EOS% 1.1  0.0 - 7.0 %   BASO% 1.5  0.0 - 2.0 %    Lab Results  Component Value Date   WBC 2.7* 09/26/2013   HGB 9.1* 09/26/2013   HCT 29.3* 09/26/2013   MCV 94.8 09/26/2013   PLT 138* 09/26/2013    ASSESSMENT & PLAN:  #1  severe autoimmune neutropenia #2 chronic nonhealing wound She is responding well to G-CSF. Once the wound is completely healed, we could consider second line treatment for her autoimmune neutropenia. I would like to try infusional rituximab in the future. For her chronic nonhealing wound, recommend continue dressing changes and followup at the wound care center I plan to initiate prednisone taper in the future. #3 anemia This is likely anemia of chronic disease. The patient denies recent history of bleeding such as epistaxis, hematuria or hematochezia. She is asymptomatic from the anemia. We will observe for now.  She does not require transfusion now.  #4 mild thrombocytopenia This is likely due to recent treatment with methotrexate. The patient denies recent history of bleeding such as epistaxis, hematuria or hematochezia. She is asymptomatic from the low platelet count. I will observe for now.  she does not require transfusion now. I will continue the chemotherapy at current dose without  #5 rheumatoid arthritis She may have an acute flare with discontinuation of methotrexate. I recommend she call her rheumatologist for further management.  All questions were answered. The patient knows to call the clinic with any problems, questions or concerns. No barriers to learning was detected.  I spent 40 minutes counseling the patient face to face. The total time spent in the  appointment was 55 minutes and more than 50% was on counseling.     Heath Lark, MD 09/26/2013 3:50 PM

## 2013-09-27 ENCOUNTER — Telehealth: Payer: Self-pay | Admitting: Hematology and Oncology

## 2013-09-27 ENCOUNTER — Ambulatory Visit (HOSPITAL_BASED_OUTPATIENT_CLINIC_OR_DEPARTMENT_OTHER): Payer: Medicare HMO

## 2013-09-27 ENCOUNTER — Other Ambulatory Visit: Payer: Self-pay | Admitting: *Deleted

## 2013-09-27 ENCOUNTER — Other Ambulatory Visit: Payer: Self-pay | Admitting: Hematology and Oncology

## 2013-09-27 VITALS — BP 106/58 | HR 90 | Temp 98.5°F

## 2013-09-27 DIAGNOSIS — D704 Cyclic neutropenia: Principal | ICD-10-CM

## 2013-09-27 DIAGNOSIS — M359 Systemic involvement of connective tissue, unspecified: Secondary | ICD-10-CM

## 2013-09-27 DIAGNOSIS — D708 Other neutropenia: Secondary | ICD-10-CM

## 2013-09-27 MED ORDER — TBO-FILGRASTIM 480 MCG/0.8ML ~~LOC~~ SOSY
480.0000 ug | PREFILLED_SYRINGE | Freq: Once | SUBCUTANEOUS | Status: AC
Start: 2013-09-27 — End: 2013-09-27
  Administered 2013-09-27: 480 ug via SUBCUTANEOUS
  Filled 2013-09-27: qty 0.8

## 2013-09-27 NOTE — Progress Notes (Signed)
[facility]  NAME:  [name], LUCIEN            ACCOUNT NO.:  [id number]  MEDICAL RECORD NO.:  [id number]      DATE OF BIRTH:  1951/03/04  PHYSICIAN:  Theodoro Kos, DO       VISIT DATE:  09/26/2013                                  OFFICE VISIT   The patient is a 63 year old female, who is here for followup on her right leg ulcer.  Overall, she is very pleased with the ulcer.  We had Endoform on it last week and is showing some positive signs of improvement with a decrease in depth.  It looks little bit wider, but overall appears to be doing better.  No change in medications or social history.  She is alert, oriented, cooperative, not in any acute distress.  She is pleasant.  Pupils are equal.  Breathing unlabored.  Heart rate, regular.  The wound is described in the note.  Another Endoform was placed.  We will continue with that plus Profore and have her follow up in a week.     Theodoro Kos, DO     CS/MEDQ  D:  09/26/2013  T:  09/27/2013  Job:  [id number]

## 2013-09-27 NOTE — Telephone Encounter (Signed)
s.w. pt and advised on only inj tomorrow////appt moved to 5.4.15

## 2013-09-28 ENCOUNTER — Ambulatory Visit (HOSPITAL_BASED_OUTPATIENT_CLINIC_OR_DEPARTMENT_OTHER): Payer: Medicare HMO

## 2013-09-28 ENCOUNTER — Ambulatory Visit: Payer: Medicare HMO | Admitting: Hematology and Oncology

## 2013-09-28 ENCOUNTER — Other Ambulatory Visit: Payer: Medicare HMO

## 2013-09-28 VITALS — BP 101/47 | HR 87 | Temp 98.3°F

## 2013-09-28 DIAGNOSIS — D704 Cyclic neutropenia: Principal | ICD-10-CM

## 2013-09-28 DIAGNOSIS — M359 Systemic involvement of connective tissue, unspecified: Secondary | ICD-10-CM

## 2013-09-28 DIAGNOSIS — D708 Other neutropenia: Secondary | ICD-10-CM

## 2013-09-28 LAB — CBC WITH DIFFERENTIAL/PLATELET
BASO%: 0.3 % (ref 0.0–2.0)
BASOS ABS: 0 10*3/uL (ref 0.0–0.1)
EOS ABS: 0 10*3/uL (ref 0.0–0.5)
EOS%: 0.2 % (ref 0.0–7.0)
HCT: 29.7 % — ABNORMAL LOW (ref 34.8–46.6)
HEMOGLOBIN: 9.7 g/dL — AB (ref 11.6–15.9)
LYMPH%: 3.7 % — AB (ref 14.0–49.7)
MCH: 30.8 pg (ref 25.1–34.0)
MCHC: 32.8 g/dL (ref 31.5–36.0)
MCV: 94 fL (ref 79.5–101.0)
MONO#: 0.2 10*3/uL (ref 0.1–0.9)
MONO%: 2.2 % (ref 0.0–14.0)
NEUT%: 93.6 % — AB (ref 38.4–76.8)
NEUTROS ABS: 10.2 10*3/uL — AB (ref 1.5–6.5)
PLATELETS: 153 10*3/uL (ref 145–400)
RBC: 3.16 10*6/uL — ABNORMAL LOW (ref 3.70–5.45)
RDW: 18.7 % — ABNORMAL HIGH (ref 11.2–14.5)
WBC: 10.9 10*3/uL — ABNORMAL HIGH (ref 3.9–10.3)
lymph#: 0.4 10*3/uL — ABNORMAL LOW (ref 0.9–3.3)

## 2013-09-28 MED ORDER — TBO-FILGRASTIM 480 MCG/0.8ML ~~LOC~~ SOSY
480.0000 ug | PREFILLED_SYRINGE | Freq: Once | SUBCUTANEOUS | Status: AC
  Administered 2013-09-28: 480 ug via SUBCUTANEOUS
  Filled 2013-09-28: qty 0.8

## 2013-09-29 ENCOUNTER — Ambulatory Visit: Payer: Medicare HMO

## 2013-09-30 ENCOUNTER — Ambulatory Visit: Payer: Medicare HMO

## 2013-10-01 ENCOUNTER — Ambulatory Visit: Payer: Medicare HMO

## 2013-10-03 ENCOUNTER — Ambulatory Visit: Payer: Medicare HMO

## 2013-10-03 DIAGNOSIS — L97809 Non-pressure chronic ulcer of other part of unspecified lower leg with unspecified severity: Secondary | ICD-10-CM | POA: Diagnosis not present

## 2013-10-04 ENCOUNTER — Encounter (INDEPENDENT_AMBULATORY_CARE_PROVIDER_SITE_OTHER): Payer: Commercial Managed Care - HMO | Admitting: General Surgery

## 2013-10-04 ENCOUNTER — Ambulatory Visit: Payer: Medicare HMO

## 2013-10-05 ENCOUNTER — Ambulatory Visit: Payer: Medicare HMO

## 2013-10-06 ENCOUNTER — Ambulatory Visit: Payer: Medicare HMO

## 2013-10-10 ENCOUNTER — Other Ambulatory Visit (HOSPITAL_BASED_OUTPATIENT_CLINIC_OR_DEPARTMENT_OTHER): Payer: Medicare HMO

## 2013-10-10 ENCOUNTER — Ambulatory Visit (HOSPITAL_BASED_OUTPATIENT_CLINIC_OR_DEPARTMENT_OTHER): Payer: Medicare HMO | Admitting: Hematology and Oncology

## 2013-10-10 ENCOUNTER — Telehealth: Payer: Self-pay | Admitting: *Deleted

## 2013-10-10 ENCOUNTER — Encounter: Payer: Self-pay | Admitting: Hematology and Oncology

## 2013-10-10 ENCOUNTER — Encounter (HOSPITAL_BASED_OUTPATIENT_CLINIC_OR_DEPARTMENT_OTHER): Payer: Medicare HMO | Attending: Plastic Surgery

## 2013-10-10 ENCOUNTER — Telehealth: Payer: Self-pay | Admitting: Internal Medicine

## 2013-10-10 ENCOUNTER — Other Ambulatory Visit: Payer: Self-pay | Admitting: Physical Medicine & Rehabilitation

## 2013-10-10 VITALS — BP 106/59 | HR 86 | Temp 97.9°F | Resp 18 | Ht 62.0 in | Wt 160.5 lb

## 2013-10-10 DIAGNOSIS — D704 Cyclic neutropenia: Principal | ICD-10-CM

## 2013-10-10 DIAGNOSIS — D708 Other neutropenia: Secondary | ICD-10-CM

## 2013-10-10 DIAGNOSIS — M359 Systemic involvement of connective tissue, unspecified: Secondary | ICD-10-CM

## 2013-10-10 DIAGNOSIS — M069 Rheumatoid arthritis, unspecified: Secondary | ICD-10-CM

## 2013-10-10 DIAGNOSIS — D649 Anemia, unspecified: Secondary | ICD-10-CM

## 2013-10-10 DIAGNOSIS — M0500 Felty's syndrome, unspecified site: Secondary | ICD-10-CM

## 2013-10-10 LAB — CBC WITH DIFFERENTIAL/PLATELET
BASO%: 1.6 % (ref 0.0–2.0)
Basophils Absolute: 0.1 10*3/uL (ref 0.0–0.1)
EOS ABS: 0 10*3/uL (ref 0.0–0.5)
EOS%: 1 % (ref 0.0–7.0)
HCT: 35.5 % (ref 34.8–46.6)
HGB: 11.9 g/dL (ref 11.6–15.9)
LYMPH%: 20.8 % (ref 14.0–49.7)
MCH: 31.6 pg (ref 25.1–34.0)
MCHC: 33.5 g/dL (ref 31.5–36.0)
MCV: 94.4 fL (ref 79.5–101.0)
MONO#: 0.3 10*3/uL (ref 0.1–0.9)
MONO%: 7.8 % (ref 0.0–14.0)
NEUT#: 2.8 10*3/uL (ref 1.5–6.5)
NEUT%: 68.8 % (ref 38.4–76.8)
PLATELETS: 208 10*3/uL (ref 145–400)
RBC: 3.77 10*6/uL (ref 3.70–5.45)
RDW: 19.1 % — AB (ref 11.2–14.5)
WBC: 4 10*3/uL (ref 3.9–10.3)
lymph#: 0.8 10*3/uL — ABNORMAL LOW (ref 0.9–3.3)

## 2013-10-10 NOTE — Telephone Encounter (Signed)
Gave pt appt for lab and MD , emailed michelle regarding chemo for friday

## 2013-10-10 NOTE — Progress Notes (Signed)
[facility]  NAME:  [name], [name]                 ACCOUNT NO.:  MEDICAL RECORD NO.:  [id number]      DATE OF BIRTH:  1950/12/25  PHYSICIAN:  Theodoro Kos, DO       VISIT DATE:  10/03/2013                                  OFFICE VISIT   SUBJECTIVE:  The patient is a 63 year old female with a right leg ulcer. She had a Endoform placed last week with a Profore.  She is doing extremely well.  With that there is no sign of infection.  The area is filling in.  No change in her medications or social history.  REVIEW OF SYSTEMS:  Otherwise negative.  PHYSICAL EXAMINATION:  On exam, she is very pleasant and very pleased with her progress.  She is alert and oriented.  Her breathing is unlabored.  Her heart rate is regular.  Her pulses are present she has lots of varicose veins and the area of trauma just inferior distal to the wound.  The wound is clean, it is filling in.  The medial aspect of the wound has closed up and Endoform was placed and she is to change the dressing every other day, and I will see her back in a week.  She has not done Ensure because of the sugar content, so we have asked her to do the Glucerna  and we will plan to see her back next week.     Theodoro Kos, DO     CS/MEDQ  D:  10/03/2013  T:  10/04/2013  Job:  [id number]

## 2013-10-10 NOTE — Progress Notes (Signed)
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 Bone marrow aspirate and biopsy was performed in 2002 and 2004 with no abnormalities found. The patient was treated with G-CSF with good response. She was subsequently started on methotrexate weekly along with prednisone.  On 09/09/2013, methotrexate was discontinued due to severe neutropenia. She was started on G-CSF injection daily. #2 Chronic anemia The patient had history of GI bleed in 2012 are negative endoscopy and colonoscopy.  INTERVAL HISTORY: [name] 63 y.o. female returns for further followup. Her chronic nonhealing wound in the right lower extremity is almost completely resolved. She denies any flare of rheumatoid arthritis. She remained on 10 mg of prednisone daily and is on a taper course. She denies any recent fever, chills, night sweats or abnormal weight loss   I have reviewed the past medical history, past surgical history, social history and family history with the patient and they are unchanged from previous note.  ALLERGIES:  is allergic to penicillins.  MEDICATIONS:  Current Outpatient Prescriptions  Medication Sig Dispense Refill  . Alum & Mag Hydroxide-Simeth (MAGIC MOUTHWASH W/LIDOCAINE) SOLN Take 5 mLs by mouth 3 (three) times daily as needed.  240 mL  0  . Ascorbic Acid (VITAMIN C) 1000 MG tablet Take 1,000 mg by mouth daily.      . calcium carbonate (CALCIUM 600) 600 MG TABS Take 600 mg by mouth 2 (two) times daily with a meal.        . Cholecalciferol (VITAMIN D) 1000 UNITS capsule  Take 1,000 Units by mouth daily.       . cyclobenzaprine (FLEXERIL) 10 MG tablet Take 1 tablet (10 mg total) by mouth 3 (three) times daily as needed for muscle spasms.  90 tablet  2  . diclofenac sodium (VOLTAREN) 1 % GEL Apply 2 g topically 4 (four) times daily.  3 Tube  2  . DULoxetine (CYMBALTA) 60 MG capsule TAKE ONE CAPSULE BY MOUTH TWICE DAILY  180 capsule  3  . folic acid (FOLVITE) 1 MG tablet Take 1 mg by mouth daily.      Marland Kitchen HYDROcodone-acetaminophen (NORCO) 7.5-325 MG per tablet Take 1 tablet by mouth every 4 (four) hours as needed. Max 6 per day  180 tablet  0  . Melatonin 10 MG CAPS Take 1 tablet by mouth at bedtime.      Marland Kitchen omeprazole (PRILOSEC) 20 MG capsule Take 20 mg by mouth 2 (two) times daily.       . predniSONE (DELTASONE) 10 MG tablet TAKE DAILY BY MOUTH AS DIRECTED BY PHYSICIAN.  60 tablet  3  . pregabalin (LYRICA) 200 MG capsule TAKE 1 CAPSULE BY MOUTH TWICE DAILY  60 capsule  0  . Probiotic Product (PROBIOTIC FORMULA) CAPS Take 1 capsule by mouth daily.        . vitamin A 10000 UNIT capsule Take 10,000 Units by mouth daily.      . vitamin B-12 (CYANOCOBALAMIN) 1000 MCG tablet Take 1,000 mcg by mouth every other day.       . zinc gluconate 50 MG tablet Take 100 mg by mouth daily.      . nortriptyline (PAMELOR) 50 MG  capsule TAKE TWO CAPSULES BY MOUTH AT BEDTIME  60 capsule  0  . [DISCONTINUED] POTASSIUM PO Take 20 mEq by mouth 2 (two) times daily.       No current facility-administered medications for this visit.     REVIEW OF SYSTEMS:   Constitutional: Denies fevers, chills or night sweats Eyes: Denies blurriness of vision Ears, nose, mouth, throat, and face: Denies mucositis or sore throat Respiratory: Denies cough, dyspnea or wheezes Cardiovascular: Denies palpitation, chest discomfort or lower extremity swelling Gastrointestinal:  Denies nausea, heartburn or change in bowel habits Skin: Denies abnormal skin rashes Lymphatics: Denies new  lymphadenopathy Neurological:Denies numbness, tingling or new weaknesses Behavioral/Psych: Mood is stable, no new changes  All other systems were reviewed with the patient and are negative.  PHYSICAL EXAMINATION: ECOG PERFORMANCE STATUS: 1 - Symptomatic but completely ambulatory  Filed Vitals:   10/10/13 0933  BP: 106/59  Pulse: 86  Temp: 97.9 F (36.6 C)  Resp: 18   Filed Weights   10/10/13 0933  Weight: 160 lb 8 oz (72.802 kg)    GENERAL:alert, no distress and comfortable. She has cushingoid features. SKIN: skin color, texture, turgor are normal, no rashes or significant lesions. She has thin skin EYES: normal, Conjunctiva are pink and non-injected, sclera clear OROPHARYNX:no exudate, no erythema and lips, buccal mucosa, and tongue normal  Musculoskeletal:no cyanosis of digits and no clubbing . She has significant arthritis with rheumatoid deformities. NEURO: alert & oriented x 3 with fluent speech, no focal motor/sensory deficits  LABORATORY DATA:  I have reviewed the data as listed Results for orders placed in visit on 10/10/13 (from the past 48 hour(s))  CBC WITH DIFFERENTIAL     Status: Abnormal   Collection Time    10/10/13  8:56 AM      Result Value Ref Range   WBC 4.0  3.9 - 10.3 10e3/uL   NEUT# 2.8  1.5 - 6.5 10e3/uL   HGB 11.9  11.6 - 15.9 g/dL   HCT 35.5  34.8 - 46.6 %   Platelets 208  145 - 400 10e3/uL   MCV 94.4  79.5 - 101.0 fL   MCH 31.6  25.1 - 34.0 pg   MCHC 33.5  31.5 - 36.0 g/dL   RBC 3.77  3.70 - 5.45 10e6/uL   RDW 19.1 (*) 11.2 - 14.5 %   lymph# 0.8 (*) 0.9 - 3.3 10e3/uL   MONO# 0.3  0.1 - 0.9 10e3/uL   Eosinophils Absolute 0.0  0.0 - 0.5 10e3/uL   Basophils Absolute 0.1  0.0 - 0.1 10e3/uL   NEUT% 68.8  38.4 - 76.8 %   LYMPH% 20.8  14.0 - 49.7 %   MONO% 7.8  0.0 - 14.0 %   EOS% 1.0  0.0 - 7.0 %   BASO% 1.6  0.0 - 2.0 %    Lab Results  Component Value Date   WBC 4.0 10/10/2013   HGB 11.9 10/10/2013   HCT 35.5 10/10/2013   MCV 94.4 10/10/2013    PLT 208 10/10/2013    ASSESSMENT & PLAN:  #1 severe autoimmune neutropenia #2 rheumatoid arthritis She is responding well to G-CSF.  The patient has failed treatment with corticosteroids and methotrexate. I would like to consider third line treatment for her autoimmune neutropenia. I would like to try infusional rituximab in the future. We discussed the role of monoclonal antibody treatment.. The intent is for maintenance therapy. My plan would be to give her fourth weekly infusion treatment followed by  future treatment and perhaps every 6 months.  We discussed some of the risks, benefits, side-effects of Rituximab.  Some of the short term side-effects included, though not limited to, risk of fatigue, pancytopenia, life-threatening infections, allergic reactions, nausea, vomiting, sores in the mouth, changes in bowel habits especially diarrhea, admission to hospital for various reasons, and risks of death.   The patient is aware that the response rates discussed earlier is not guaranteed.    After a long discussion, patient made an informed decision to proceed with the prescribed plan of care and went ahead to sign the consent form today.   Patient education material was dispensed. #3 chronic nonhealing wound For her chronic nonhealing wound, I recommend continue dressing changes and followup at the wound care center I plan to initiate prednisone taper 5 mg alternate with 10 mg until I see her back at the end of the month.  #4 anemia This is likely anemia of chronic disease. The patient denies recent history of bleeding such as epistaxis, hematuria or hematochezia. She is asymptomatic from the anemia. We will observe for now.  She does not require transfusion now.   All questions were answered. The patient knows to call the clinic with any problems, questions or concerns. No barriers to learning was detected.  Heath Lark, MD 10/10/2013 9:54 PM

## 2013-10-10 NOTE — Telephone Encounter (Signed)
Per staff message and POF I have scheduled appts.  JMW  

## 2013-10-10 NOTE — Patient Instructions (Signed)

## 2013-10-11 ENCOUNTER — Ambulatory Visit: Payer: Medicare HMO | Admitting: Physical Medicine & Rehabilitation

## 2013-10-12 ENCOUNTER — Telehealth: Payer: Self-pay | Admitting: Hematology and Oncology

## 2013-10-12 ENCOUNTER — Encounter: Payer: Self-pay | Admitting: *Deleted

## 2013-10-12 ENCOUNTER — Other Ambulatory Visit: Payer: Commercial Managed Care - HMO

## 2013-10-12 NOTE — Telephone Encounter (Signed)
Called pt and left message regarding chemo, lab and MD for May 2015

## 2013-10-14 ENCOUNTER — Ambulatory Visit (HOSPITAL_BASED_OUTPATIENT_CLINIC_OR_DEPARTMENT_OTHER): Payer: Commercial Managed Care - HMO

## 2013-10-14 VITALS — BP 97/60 | HR 81 | Temp 98.0°F | Resp 18

## 2013-10-14 DIAGNOSIS — M359 Systemic involvement of connective tissue, unspecified: Secondary | ICD-10-CM

## 2013-10-14 DIAGNOSIS — Z5112 Encounter for antineoplastic immunotherapy: Secondary | ICD-10-CM

## 2013-10-14 DIAGNOSIS — D704 Cyclic neutropenia: Secondary | ICD-10-CM

## 2013-10-14 DIAGNOSIS — M0500 Felty's syndrome, unspecified site: Secondary | ICD-10-CM

## 2013-10-14 MED ORDER — DIPHENHYDRAMINE HCL 25 MG PO CAPS
ORAL_CAPSULE | ORAL | Status: AC
  Filled 2013-10-14: qty 2

## 2013-10-14 MED ORDER — ACETAMINOPHEN 325 MG PO TABS
650.0000 mg | ORAL_TABLET | Freq: Once | ORAL | Status: AC
  Administered 2013-10-14: 650 mg via ORAL

## 2013-10-14 MED ORDER — SODIUM CHLORIDE 0.9 % IV SOLN
Freq: Once | INTRAVENOUS | Status: AC
  Administered 2013-10-14: 11:00:00 via INTRAVENOUS

## 2013-10-14 MED ORDER — SODIUM CHLORIDE 0.9 % IV SOLN
375.0000 mg/m2 | Freq: Once | INTRAVENOUS | Status: AC
  Administered 2013-10-14: 700 mg via INTRAVENOUS
  Filled 2013-10-14: qty 70

## 2013-10-14 MED ORDER — ACETAMINOPHEN 325 MG PO TABS
ORAL_TABLET | ORAL | Status: AC
  Filled 2013-10-14: qty 2

## 2013-10-14 MED ORDER — DIPHENHYDRAMINE HCL 25 MG PO CAPS
50.0000 mg | ORAL_CAPSULE | Freq: Once | ORAL | Status: AC
  Administered 2013-10-14: 50 mg via ORAL

## 2013-10-14 NOTE — Patient Instructions (Signed)
[facility] Discharge Instructions for Patients Receiving Chemotherapy  Today you received the following chemotherapy agents Rituxan.  To help prevent nausea and vomiting after your treatment, we encourage you to take your nausea medication as prescribed.   If you develop nausea and vomiting that is not controlled by your nausea medication, call the clinic.   BELOW ARE SYMPTOMS THAT SHOULD BE REPORTED IMMEDIATELY:  *FEVER GREATER THAN 100.5 F  *CHILLS WITH OR WITHOUT FEVER  NAUSEA AND VOMITING THAT IS NOT CONTROLLED WITH YOUR NAUSEA MEDICATION  *UNUSUAL SHORTNESS OF BREATH  *UNUSUAL BRUISING OR BLEEDING  TENDERNESS IN MOUTH AND THROAT WITH OR WITHOUT PRESENCE OF ULCERS  *URINARY PROBLEMS  *BOWEL PROBLEMS  UNUSUAL RASH Items with * indicate a potential emergency and should be followed up as soon as possible.  Feel free to call the clinic you have any questions or concerns. The clinic phone number is [phone].    

## 2013-10-17 ENCOUNTER — Telehealth: Payer: Self-pay | Admitting: *Deleted

## 2013-10-17 NOTE — Telephone Encounter (Addendum)
Attempted to call patient to discuss any potential side effects she may be having following treatment with Rituxan on Friday. Left voice mail to call us back with any questions or conerns.

## 2013-10-17 NOTE — Telephone Encounter (Signed)
Message copied by Verlon Setting on Mon Oct 17, 2013  3:02 PM ------      Message from: Renford Dills      Created: Fri Oct 14, 2013  2:33 PM      Regarding: chemo follow-up        1st Rituxan  Dr. Alvy Bimler  [phone] ------

## 2013-10-21 ENCOUNTER — Other Ambulatory Visit: Payer: Self-pay | Admitting: Hematology and Oncology

## 2013-10-21 ENCOUNTER — Telehealth: Payer: Self-pay | Admitting: Hematology and Oncology

## 2013-10-21 ENCOUNTER — Ambulatory Visit (HOSPITAL_BASED_OUTPATIENT_CLINIC_OR_DEPARTMENT_OTHER): Payer: Medicare HMO

## 2013-10-21 ENCOUNTER — Other Ambulatory Visit (HOSPITAL_BASED_OUTPATIENT_CLINIC_OR_DEPARTMENT_OTHER): Payer: Medicare HMO

## 2013-10-21 VITALS — BP 105/62 | HR 70 | Temp 97.7°F | Resp 16

## 2013-10-21 DIAGNOSIS — D704 Cyclic neutropenia: Secondary | ICD-10-CM

## 2013-10-21 DIAGNOSIS — M359 Systemic involvement of connective tissue, unspecified: Secondary | ICD-10-CM

## 2013-10-21 DIAGNOSIS — M0500 Felty's syndrome, unspecified site: Secondary | ICD-10-CM

## 2013-10-21 DIAGNOSIS — D708 Other neutropenia: Secondary | ICD-10-CM

## 2013-10-21 DIAGNOSIS — Z5112 Encounter for antineoplastic immunotherapy: Secondary | ICD-10-CM

## 2013-10-21 DIAGNOSIS — M069 Rheumatoid arthritis, unspecified: Secondary | ICD-10-CM

## 2013-10-21 LAB — CBC WITH DIFFERENTIAL/PLATELET
BASO%: 0.9 % (ref 0.0–2.0)
Basophils Absolute: 0 10*3/uL (ref 0.0–0.1)
EOS%: 0 % (ref 0.0–7.0)
Eosinophils Absolute: 0 10*3/uL (ref 0.0–0.5)
HCT: 38.5 % (ref 34.8–46.6)
HEMOGLOBIN: 12.8 g/dL (ref 11.6–15.9)
LYMPH#: 0.6 10*3/uL — AB (ref 0.9–3.3)
LYMPH%: 49.1 % (ref 14.0–49.7)
MCH: 30.6 pg (ref 25.1–34.0)
MCHC: 33.2 g/dL (ref 31.5–36.0)
MCV: 92.1 fL (ref 79.5–101.0)
MONO#: 0.3 10*3/uL (ref 0.1–0.9)
MONO%: 25.9 % — AB (ref 0.0–14.0)
NEUT%: 24.1 % — ABNORMAL LOW (ref 38.4–76.8)
NEUTROS ABS: 0.3 10*3/uL — AB (ref 1.5–6.5)
NRBC: 0 % (ref 0–0)
Platelets: 125 10*3/uL — ABNORMAL LOW (ref 145–400)
RBC: 4.18 10*6/uL (ref 3.70–5.45)
RDW: 15.8 % — AB (ref 11.2–14.5)
WBC: 1.1 10*3/uL — ABNORMAL LOW (ref 3.9–10.3)

## 2013-10-21 MED ORDER — DIPHENHYDRAMINE HCL 25 MG PO CAPS
ORAL_CAPSULE | ORAL | Status: AC
  Filled 2013-10-21: qty 2

## 2013-10-21 MED ORDER — SODIUM CHLORIDE 0.9 % IV SOLN
375.0000 mg/m2 | Freq: Once | INTRAVENOUS | Status: AC
  Administered 2013-10-21: 700 mg via INTRAVENOUS
  Filled 2013-10-21: qty 70

## 2013-10-21 MED ORDER — ACETAMINOPHEN 325 MG PO TABS
650.0000 mg | ORAL_TABLET | Freq: Once | ORAL | Status: AC
  Administered 2013-10-21: 650 mg via ORAL

## 2013-10-21 MED ORDER — ACETAMINOPHEN 325 MG PO TABS
ORAL_TABLET | ORAL | Status: AC
  Filled 2013-10-21: qty 2

## 2013-10-21 MED ORDER — TBO-FILGRASTIM 480 MCG/0.8ML ~~LOC~~ SOSY
480.0000 ug | PREFILLED_SYRINGE | Freq: Once | SUBCUTANEOUS | Status: AC
  Administered 2013-10-21: 480 ug via SUBCUTANEOUS
  Filled 2013-10-21: qty 0.8

## 2013-10-21 MED ORDER — TBO-FILGRASTIM 480 MCG/0.8ML ~~LOC~~ SOSY: 480.0000 ug | PREFILLED_SYRINGE | Freq: Once | SUBCUTANEOUS | Status: DC

## 2013-10-21 MED ORDER — DIPHENHYDRAMINE HCL 25 MG PO CAPS
50.0000 mg | ORAL_CAPSULE | Freq: Once | ORAL | Status: AC
  Administered 2013-10-21: 50 mg via ORAL

## 2013-10-21 MED ORDER — SODIUM CHLORIDE 0.9 % IV SOLN
Freq: Once | INTRAVENOUS | Status: AC
  Administered 2013-10-21: 12:00:00 via INTRAVENOUS

## 2013-10-21 NOTE — Patient Instructions (Signed)
[facility] Discharge Instructions for Patients Receiving Chemotherapy  Today you received the following chemotherapy agents: Rituxan  To help prevent nausea and vomiting after your treatment, we encourage you to take your nausea medication as prescribed by your physician.    If you develop nausea and vomiting that is not controlled by your nausea medication, call the clinic.   BELOW ARE SYMPTOMS THAT SHOULD BE REPORTED IMMEDIATELY:  *FEVER GREATER THAN 100.5 F  *CHILLS WITH OR WITHOUT FEVER  NAUSEA AND VOMITING THAT IS NOT CONTROLLED WITH YOUR NAUSEA MEDICATION  *UNUSUAL SHORTNESS OF BREATH  *UNUSUAL BRUISING OR BLEEDING  TENDERNESS IN MOUTH AND THROAT WITH OR WITHOUT PRESENCE OF ULCERS  *URINARY PROBLEMS  *BOWEL PROBLEMS  UNUSUAL RASH Items with * indicate a potential emergency and should be followed up as soon as possible.  Feel free to call the clinic you have any questions or concerns. The clinic phone number is [phone].   Tbo-Filgrastim injection What is this medicine? TBO-FILGRASTIM is used to help decrease the time you have low amounts of white blood cells after cancer treatment. It helps the body make more white blood cells. Increasing the amount of white blood cells helps to decrease the risk of infection and fever. This medicine may be used for other purposes; ask your health care provider or pharmacist if you have questions. COMMON BRAND NAME(S): Granix What should I tell my health care provider before I take this medicine? They need to know if you have any of these conditions: -history of blood diseases, like sickle cell anemia or leukemia -an unusual or allergic reaction to tbo-filgrastim, filgrastim, pegfilgrastim, other medicines, foods, dyes, or preservatives -pregnant or trying to get pregnant -breast-feeding How should I use this medicine? This medicine is for injection under the skin. It is given by a health care professional in a  hospital or clinic setting. Talk to your pediatrician regarding the use of this medicine in children. Special care may be needed. Overdosage: If you think you've taken too much of this medicine contact a poison control center or emergency room at once. Overdosage: If you think you have taken too much of this medicine contact a poison control center or emergency room at once. NOTE: This medicine is only for you. Do not share this medicine with others. What if I miss a dose? Keep appointments for follow-up doses as directed. It is important not to miss your dose. Call your doctor or health care professional if you are unable to keep an appointment. What may interact with this medicine? -lithium This list may not describe all possible interactions. Give your health care provider a list of all the medicines, herbs, non-prescription drugs, or dietary supplements you use. Also tell them if you smoke, drink alcohol, or use illegal drugs. Some items may interact with your medicine. What should I watch for while using this medicine? Your condition will be monitored carefully while you are receiving this medicine. You may need blood work done while you are taking this medicine. Avoid taking products that contain aspirin, acetaminophen, ibuprofen, naproxen, or ketoprofen unless instructed by your doctor. These medicines may hide a fever. Call your doctor or health care professional for advice if you get a fever, chills or sore throat, or other symptoms of a cold or flu. Do not treat yourself. What side effects may I notice from receiving this medicine? Side effects that you should report to your doctor or health care professional as soon as possible: -allergic reactions  reactions like skin rash, itching or hives, swelling of the face, lips, or tongue -breathing problems -fever -stomach pain  Side effects that usually do not require medical attention (Report these to your doctor or health care professional if they  continue or are bothersome.): -bone pain This list may not describe all possible side effects. Call your doctor for medical advice about side effects. You may report side effects to FDA at 1-800-FDA-1088. Where should I keep my medicine? This drug is given in a hospital or clinic and will not be stored at home. NOTE: This sheet is a summary. It may not cover all possible information. If you have questions about this medicine, talk to your doctor, pharmacist, or health care provider.  2014, Elsevier/Gold Standard. (2011-02-12 16:41:24)  

## 2013-10-21 NOTE — Addendum Note (Signed)
Addended by: Carolynne Edouard B on: 10/21/2013 11:14 AM   Modules accepted: Orders

## 2013-10-21 NOTE — Telephone Encounter (Signed)
gv adn prnted appt sched and avs for pt fro May and June

## 2013-10-21 NOTE — Progress Notes (Signed)
Per Dr. Alvy Bimler, okay to tx with today's labs with administration of Granix today and tomorrow.

## 2013-10-22 ENCOUNTER — Ambulatory Visit (HOSPITAL_BASED_OUTPATIENT_CLINIC_OR_DEPARTMENT_OTHER): Payer: Commercial Managed Care - HMO

## 2013-10-22 VITALS — BP 106/70 | HR 99 | Temp 98.0°F

## 2013-10-22 DIAGNOSIS — D708 Other neutropenia: Secondary | ICD-10-CM

## 2013-10-22 DIAGNOSIS — Z5189 Encounter for other specified aftercare: Secondary | ICD-10-CM

## 2013-10-22 MED ORDER — TBO-FILGRASTIM 480 MCG/0.8ML ~~LOC~~ SOSY
480.0000 ug | PREFILLED_SYRINGE | Freq: Once | SUBCUTANEOUS | Status: AC
  Administered 2013-10-22: 480 ug via SUBCUTANEOUS

## 2013-10-22 NOTE — Patient Instructions (Signed)
Tbo-Filgrastim injection What is this medicine? TBO-FILGRASTIM is used to help decrease the time you have low amounts of white blood cells after cancer treatment. It helps the body make more white blood cells. Increasing the amount of white blood cells helps to decrease the risk of infection and fever. This medicine may be used for other purposes; ask your health care provider or pharmacist if you have questions. COMMON BRAND NAME(S): Granix What should I tell my health care provider before I take this medicine? They need to know if you have any of these conditions: -history of blood diseases, like sickle cell anemia or leukemia -an unusual or allergic reaction to tbo-filgrastim, filgrastim, pegfilgrastim, other medicines, foods, dyes, or preservatives -pregnant or trying to get pregnant -breast-feeding How should I use this medicine? This medicine is for injection under the skin. It is given by a health care professional in a hospital or clinic setting. Talk to your pediatrician regarding the use of this medicine in children. Special care may be needed. Overdosage: If you think you've taken too much of this medicine contact a poison control center or emergency room at once. Overdosage: If you think you have taken too much of this medicine contact a poison control center or emergency room at once. NOTE: This medicine is only for you. Do not share this medicine with others. What if I miss a dose? Keep appointments for follow-up doses as directed. It is important not to miss your dose. Call your doctor or health care professional if you are unable to keep an appointment. What may interact with this medicine? -lithium This list may not describe all possible interactions. Give your health care provider a list of all the medicines, herbs, non-prescription drugs, or dietary supplements you use. Also tell them if you smoke, drink alcohol, or use illegal drugs. Some items may interact with your  medicine. What should I watch for while using this medicine? Your condition will be monitored carefully while you are receiving this medicine. You may need blood work done while you are taking this medicine. Avoid taking products that contain aspirin, acetaminophen, ibuprofen, naproxen, or ketoprofen unless instructed by your doctor. These medicines may hide a fever. Call your doctor or health care professional for advice if you get a fever, chills or sore throat, or other symptoms of a cold or flu. Do not treat yourself. What side effects may I notice from receiving this medicine? Side effects that you should report to your doctor or health care professional as soon as possible: -allergic reactions like skin rash, itching or hives, swelling of the face, lips, or tongue -breathing problems -fever -stomach pain  Side effects that usually do not require medical attention (Report these to your doctor or health care professional if they continue or are bothersome.): -bone pain This list may not describe all possible side effects. Call your doctor for medical advice about side effects. You may report side effects to FDA at 1-800-FDA-1088. Where should I keep my medicine? This drug is given in a hospital or clinic and will not be stored at home. NOTE: This sheet is a summary. It may not cover all possible information. If you have questions about this medicine, talk to your doctor, pharmacist, or health care provider.  2014, [employer]vier/Gold Standard. (2011-02-12 16:41:24)  

## 2013-10-24 ENCOUNTER — Encounter: Payer: Self-pay | Admitting: Medical Oncology

## 2013-10-24 ENCOUNTER — Other Ambulatory Visit: Payer: Self-pay | Admitting: Hematology and Oncology

## 2013-10-24 ENCOUNTER — Encounter: Payer: Medicare HMO | Attending: Physical Medicine & Rehabilitation | Admitting: Physical Medicine & Rehabilitation

## 2013-10-24 ENCOUNTER — Ambulatory Visit: Payer: Commercial Managed Care - HMO

## 2013-10-24 ENCOUNTER — Encounter: Payer: Self-pay | Admitting: Physical Medicine & Rehabilitation

## 2013-10-24 ENCOUNTER — Other Ambulatory Visit: Payer: Medicare HMO

## 2013-10-24 ENCOUNTER — Other Ambulatory Visit (HOSPITAL_BASED_OUTPATIENT_CLINIC_OR_DEPARTMENT_OTHER): Payer: Medicare HMO

## 2013-10-24 ENCOUNTER — Other Ambulatory Visit (HOSPITAL_BASED_OUTPATIENT_CLINIC_OR_DEPARTMENT_OTHER): Payer: Medicare HMO | Admitting: Medical Oncology

## 2013-10-24 VITALS — BP 130/86 | HR 97 | Resp 14 | Ht 62.0 in | Wt 161.0 lb

## 2013-10-24 DIAGNOSIS — D704 Cyclic neutropenia: Principal | ICD-10-CM

## 2013-10-24 DIAGNOSIS — D708 Other neutropenia: Secondary | ICD-10-CM

## 2013-10-24 DIAGNOSIS — IMO0001 Reserved for inherently not codable concepts without codable children: Secondary | ICD-10-CM

## 2013-10-24 DIAGNOSIS — M359 Systemic involvement of connective tissue, unspecified: Secondary | ICD-10-CM

## 2013-10-24 DIAGNOSIS — M19019 Primary osteoarthritis, unspecified shoulder: Secondary | ICD-10-CM

## 2013-10-24 DIAGNOSIS — M12819 Other specific arthropathies, not elsewhere classified, unspecified shoulder: Secondary | ICD-10-CM

## 2013-10-24 DIAGNOSIS — M0500 Felty's syndrome, unspecified site: Secondary | ICD-10-CM

## 2013-10-24 DIAGNOSIS — M069 Rheumatoid arthritis, unspecified: Secondary | ICD-10-CM

## 2013-10-24 LAB — CBC WITH DIFFERENTIAL/PLATELET
BASO%: 1 % (ref 0.0–2.0)
Basophils Absolute: 0 10*3/uL (ref 0.0–0.1)
EOS%: 0 % (ref 0.0–7.0)
Eosinophils Absolute: 0 10*3/uL (ref 0.0–0.5)
HCT: 37.5 % (ref 34.8–46.6)
HGB: 12.8 g/dL (ref 11.6–15.9)
LYMPH%: 32.8 % (ref 14.0–49.7)
MCH: 31.1 pg (ref 25.1–34.0)
MCHC: 34.1 g/dL (ref 31.5–36.0)
MCV: 91.2 fL (ref 79.5–101.0)
MONO#: 0.4 10*3/uL (ref 0.1–0.9)
MONO%: 17.6 % — ABNORMAL HIGH (ref 0.0–14.0)
NEUT#: 1 10*3/uL — ABNORMAL LOW (ref 1.5–6.5)
NEUT%: 48.6 % (ref 38.4–76.8)
NRBC: 0 % (ref 0–0)
Platelets: 87 10*3/uL — ABNORMAL LOW (ref 145–400)
RBC: 4.11 10*6/uL (ref 3.70–5.45)
RDW: 15.8 % — ABNORMAL HIGH (ref 11.2–14.5)
WBC: 2 10*3/uL — ABNORMAL LOW (ref 3.9–10.3)
lymph#: 0.7 10*3/uL — ABNORMAL LOW (ref 0.9–3.3)

## 2013-10-24 MED ORDER — HYDROCODONE-ACETAMINOPHEN 7.5-325 MG PO TABS: 1.0000 | ORAL_TABLET | ORAL | Status: DC | PRN

## 2013-10-24 MED ORDER — TBO-FILGRASTIM 480 MCG/0.8ML ~~LOC~~ SOSY
480.0000 ug | PREFILLED_SYRINGE | Freq: Once | SUBCUTANEOUS | Status: AC
  Administered 2013-10-24: 480 ug via SUBCUTANEOUS
  Filled 2013-10-24: qty 0.8

## 2013-10-24 MED ORDER — TBO-FILGRASTIM 480 MCG/0.8ML ~~LOC~~ SOSY: 480.0000 ug | PREFILLED_SYRINGE | Freq: Once | SUBCUTANEOUS | Status: DC

## 2013-10-24 NOTE — Progress Notes (Signed)
Subjective:    Patient ID: [name], female    DOB: 1950/06/20, 63 y.o.   MRN: [id number]  HPI  [name] is back regarding her RA. She has continued to exercise and work on her weight.   She saw the wound care clinic for her blisters/abscess. The area on her right leg finally has started to heal.   H/O has been following her to treat her neutropenia which has helped her wound healing and overall picture. She is currently on rituxan.       Pain Inventory Average Pain 6 Pain Right Now 7 My pain is sharp and aching  In the last 24 hours, has pain interfered with the following? General activity 6 Relation with others 4 Enjoyment of life 6 What TIME of day is your pain at its worst? morning Sleep (in general) Fair  Pain is worse with: standing and some activites Pain improves with: rest, medication and injections Relief from Meds: 5  Mobility walk without assistance how many minutes can you walk? 15 ability to climb steps?  yes do you drive?  yes transfers alone Do you have any goals in this area?  no  Function disabled: date disabled 01/16/2008 I need assistance with the following:  dressing, meal prep, household duties and shopping Do you have any goals in this area?  no  Neuro/Psych weakness dizziness depression anxiety  Prior Studies Any changes since last visit?  no  Physicians involved in your care Any changes since last visit?  yes Dr. Alvy Bimler- [facility](hematologist)   Family History  Problem Relation Age of Onset  . Clotting disorder Mother     Coumadin  . Allergies Mother   . Heart disease Mother   . Stroke Father   . Heart disease Father   . Alzheimer's disease Father    History   Social History  . Marital Status: Married    Spouse Name: N/A    Number of Children: 1  . Years of Education: N/A   Occupational History  . disability    Social History Main Topics  . Smoking status: Former Smoker -- 0.20 packs/day for  40 years    Types: Cigarettes    Quit date: 12/16/2011  . Smokeless tobacco: Never Used  . Alcohol Use: Yes     Comment: social  . Drug Use: No  . Sexual Activity: None   Other Topics Concern  . None   Social History Narrative  . None   Past Surgical History  Procedure Laterality Date  . Total abdominal hysterectomy    . Tubal ligation    . Appendectomy    . Total knee arthroplasty      left  . Rotator cuff repair    . Foot fracture surgery    . Synovectomy of l thumb    . Fusion of r wrist    . Upper palate surgery for tmj syndrome    . Replacement total knee      left   Past Medical History  Diagnosis Date  . Diverticulosis   . Fibromyalgia   . Anemia   . Rheumatoid arthritis(714.0)   . Anxiety   . Blood transfusion   . GERD (gastroesophageal reflux disease)   . Myalgia   . Rheumatoid arthritis(714.0)   . Depression   . Joint effusion   . Calcifying tendinitis of shoulder   . Myalgia and myositis    BP 130/86  Pulse 97  Resp 14  Ht 5'  2" (1.575 m)  Wt 161 lb (73.029 kg)  BMI 29.44 kg/m2  SpO2 98%  Opioid Risk Score:   Fall Risk Score: Moderate Fall Risk (6-13 points) (pt educated on fall risk, pt received brochure previously)    Review of Systems  Musculoskeletal: Positive for myalgias.  Neurological: Positive for dizziness and weakness.  Psychiatric/Behavioral: The patient is nervous/anxious.        Depression  All other systems reviewed and are negative.      Objective:   Physical Exam  Constitutional: She appears well-developed and well-nourished.  HENT:  Head: Normocephalic and atraumatic.  Eyes: Conjunctivae and EOM are normal. Pupils are equal, round, and reactive to light.  Neck: Normal range of motion. Neck supple.  Cardiovascular: Normal rate and regular rhythm.  Pulmonary/Chest: Effort normal and breath sounds normal.  Abdominal: Soft.  Musculoskeletal:  Right shoulder: She exhibits decreased range of motion, tenderness and  bony tenderness.  Left shoulder: She exhibits decreased range of motion, tenderness and bony tenderness.  Hands with severe ulnar deviation. 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 MCP's are swollen/large more so on the right, although her left hand is more tender.Grip is decreased. She has deviation of the toes on the right foot more than left. Shuffles feet with gait.  Neurological: She has normal strength. No cranial nerve deficit or sensory deficit. She displays a negative Romberg sign.  Reflex Scores:  Tricep reflexes are 1+ on the right side and 1+ on the left side.  Bicep reflexes are 1+ on the right side and 1+ on the left side.  Brachioradialis reflexes are 1+ on the right side and 1+ on the left side.  Patellar reflexes are 1+ on the right side and 1+ on the left side.  Achilles reflexes are 1+ on the right side and 1+ on the left side. Psychiatric: She has a normal mood and affect. Her behavior is normal. Judgment and thought content normal.   Assessment & Plan:   ASSESSMENT:  1. History of rheumatoid arthritis and fibromyalgia  2. Bilateral rotator cuff syndrome.    PLAN:  1. Await hand surgery pending her receiving her medical records from her prior hand surgeon.  2. Continue with HEP  3. Refilled her hydrocodone 7.5/325 one q.4-6 prn #180  4. Rheum follow up as indicated.  6. Melatonin for sleep as needed.

## 2013-10-24 NOTE — Progress Notes (Signed)
Per Dr. Alvy Bimler, patient neutropenic and receive Granix injection today, tomorrow, and the next day. Orders placed by MD.  Per MD, Granix injection given today. Patient scheduled to return for injection for the next two days. Patient aware.

## 2013-10-24 NOTE — Patient Instructions (Signed)
CONSIDER MAGNETIC GLOVES TO HELP YOUR HANDS.

## 2013-10-25 ENCOUNTER — Ambulatory Visit (HOSPITAL_BASED_OUTPATIENT_CLINIC_OR_DEPARTMENT_OTHER): Payer: Commercial Managed Care - HMO

## 2013-10-25 VITALS — BP 96/10 | HR 102 | Temp 98.2°F

## 2013-10-25 DIAGNOSIS — M359 Systemic involvement of connective tissue, unspecified: Secondary | ICD-10-CM

## 2013-10-25 DIAGNOSIS — D708 Other neutropenia: Secondary | ICD-10-CM

## 2013-10-25 DIAGNOSIS — D704 Cyclic neutropenia: Principal | ICD-10-CM

## 2013-10-25 MED ORDER — TBO-FILGRASTIM 480 MCG/0.8ML ~~LOC~~ SOSY
480.0000 ug | PREFILLED_SYRINGE | Freq: Once | SUBCUTANEOUS | Status: AC
  Administered 2013-10-25: 480 ug via SUBCUTANEOUS
  Filled 2013-10-25: qty 0.8

## 2013-10-26 ENCOUNTER — Ambulatory Visit (HOSPITAL_BASED_OUTPATIENT_CLINIC_OR_DEPARTMENT_OTHER): Payer: Commercial Managed Care - HMO

## 2013-10-26 VITALS — BP 97/68 | HR 100 | Temp 98.4°F

## 2013-10-26 DIAGNOSIS — M359 Systemic involvement of connective tissue, unspecified: Secondary | ICD-10-CM

## 2013-10-26 DIAGNOSIS — D708 Other neutropenia: Secondary | ICD-10-CM

## 2013-10-26 DIAGNOSIS — D704 Cyclic neutropenia: Principal | ICD-10-CM

## 2013-10-26 MED ORDER — TBO-FILGRASTIM 480 MCG/0.8ML ~~LOC~~ SOSY
480.0000 ug | PREFILLED_SYRINGE | Freq: Once | SUBCUTANEOUS | Status: AC
  Administered 2013-10-26: 480 ug via SUBCUTANEOUS
  Filled 2013-10-26: qty 0.8

## 2013-10-28 ENCOUNTER — Other Ambulatory Visit: Payer: Self-pay | Admitting: Hematology and Oncology

## 2013-10-28 ENCOUNTER — Ambulatory Visit (HOSPITAL_BASED_OUTPATIENT_CLINIC_OR_DEPARTMENT_OTHER): Payer: Commercial Managed Care - HMO

## 2013-10-28 ENCOUNTER — Other Ambulatory Visit (HOSPITAL_BASED_OUTPATIENT_CLINIC_OR_DEPARTMENT_OTHER): Payer: Commercial Managed Care - HMO

## 2013-10-28 ENCOUNTER — Telehealth: Payer: Self-pay | Admitting: *Deleted

## 2013-10-28 ENCOUNTER — Telehealth: Payer: Self-pay | Admitting: Hematology and Oncology

## 2013-10-28 ENCOUNTER — Other Ambulatory Visit: Payer: Self-pay | Admitting: *Deleted

## 2013-10-28 VITALS — BP 98/64 | HR 79 | Temp 98.3°F

## 2013-10-28 DIAGNOSIS — M359 Systemic involvement of connective tissue, unspecified: Secondary | ICD-10-CM

## 2013-10-28 DIAGNOSIS — M0500 Felty's syndrome, unspecified site: Secondary | ICD-10-CM

## 2013-10-28 DIAGNOSIS — D704 Cyclic neutropenia: Secondary | ICD-10-CM

## 2013-10-28 DIAGNOSIS — Z5112 Encounter for antineoplastic immunotherapy: Secondary | ICD-10-CM

## 2013-10-28 LAB — CBC WITH DIFFERENTIAL/PLATELET
BASO%: 0.2 % (ref 0.0–2.0)
Basophils Absolute: 0 10*3/uL (ref 0.0–0.1)
EOS%: 0.2 % (ref 0.0–7.0)
Eosinophils Absolute: 0 10*3/uL (ref 0.0–0.5)
HCT: 35.2 % (ref 34.8–46.6)
HGB: 11.8 g/dL (ref 11.6–15.9)
LYMPH#: 0.7 10*3/uL — AB (ref 0.9–3.3)
LYMPH%: 13 % — ABNORMAL LOW (ref 14.0–49.7)
MCH: 30.8 pg (ref 25.1–34.0)
MCHC: 33.5 g/dL (ref 31.5–36.0)
MCV: 91.9 fL (ref 79.5–101.0)
MONO#: 0.2 10*3/uL (ref 0.1–0.9)
MONO%: 4 % (ref 0.0–14.0)
NEUT#: 4.7 10*3/uL (ref 1.5–6.5)
NEUT%: 82.6 % — ABNORMAL HIGH (ref 38.4–76.8)
Platelets: 70 10*3/uL — ABNORMAL LOW (ref 145–400)
RBC: 3.83 10*6/uL (ref 3.70–5.45)
RDW: 15.8 % — ABNORMAL HIGH (ref 11.2–14.5)
WBC: 5.7 10*3/uL (ref 3.9–10.3)

## 2013-10-28 MED ORDER — TBO-FILGRASTIM 480 MCG/0.8ML ~~LOC~~ SOSY
480.0000 ug | PREFILLED_SYRINGE | Freq: Once | SUBCUTANEOUS | Status: DC
  Filled 2013-10-28: qty 0.8

## 2013-10-28 MED ORDER — SODIUM CHLORIDE 0.9 % IV SOLN: Freq: Once | INTRAVENOUS | Status: DC

## 2013-10-28 MED ORDER — ACETAMINOPHEN 325 MG PO TABS
ORAL_TABLET | ORAL | Status: AC
  Filled 2013-10-28: qty 2

## 2013-10-28 MED ORDER — SODIUM CHLORIDE 0.9 % IV SOLN
375.0000 mg/m2 | Freq: Once | INTRAVENOUS | Status: AC
  Administered 2013-10-28: 700 mg via INTRAVENOUS
  Filled 2013-10-28: qty 70

## 2013-10-28 MED ORDER — DIPHENHYDRAMINE HCL 25 MG PO CAPS
50.0000 mg | ORAL_CAPSULE | Freq: Once | ORAL | Status: AC
  Administered 2013-10-28: 50 mg via ORAL

## 2013-10-28 MED ORDER — DIPHENHYDRAMINE HCL 25 MG PO CAPS
ORAL_CAPSULE | ORAL | Status: AC
  Filled 2013-10-28: qty 2

## 2013-10-28 MED ORDER — ACETAMINOPHEN 325 MG PO TABS
650.0000 mg | ORAL_TABLET | Freq: Once | ORAL | Status: AC
  Administered 2013-10-28: 650 mg via ORAL

## 2013-10-28 NOTE — Telephone Encounter (Signed)
Per staff message and POF I have scheduled appts.  JMW  

## 2013-10-28 NOTE — Progress Notes (Signed)
Notified Dr. Alvy Bimler of WBC, [place] and platelet counts. Per Dr. Alvy Bimler- hold Granix, ok to give Rituxan.

## 2013-10-28 NOTE — Telephone Encounter (Signed)
returned pt call and r/s lab per pt request....done pt ok and aware

## 2013-10-29 ENCOUNTER — Ambulatory Visit: Payer: Commercial Managed Care - HMO

## 2013-11-01 ENCOUNTER — Other Ambulatory Visit: Payer: Self-pay | Admitting: Hematology and Oncology

## 2013-11-01 ENCOUNTER — Other Ambulatory Visit (HOSPITAL_BASED_OUTPATIENT_CLINIC_OR_DEPARTMENT_OTHER): Payer: Commercial Managed Care - HMO

## 2013-11-01 ENCOUNTER — Telehealth: Payer: Self-pay | Admitting: Hematology and Oncology

## 2013-11-01 ENCOUNTER — Ambulatory Visit (HOSPITAL_BASED_OUTPATIENT_CLINIC_OR_DEPARTMENT_OTHER): Payer: Commercial Managed Care - HMO

## 2013-11-01 ENCOUNTER — Telehealth: Payer: Self-pay | Admitting: Physical Medicine & Rehabilitation

## 2013-11-01 ENCOUNTER — Other Ambulatory Visit: Payer: Commercial Managed Care - HMO

## 2013-11-01 VITALS — BP 109/64 | HR 80 | Temp 97.9°F | Resp 18

## 2013-11-01 DIAGNOSIS — M0500 Felty's syndrome, unspecified site: Secondary | ICD-10-CM

## 2013-11-01 DIAGNOSIS — D704 Cyclic neutropenia: Principal | ICD-10-CM

## 2013-11-01 DIAGNOSIS — D708 Other neutropenia: Secondary | ICD-10-CM

## 2013-11-01 DIAGNOSIS — M359 Systemic involvement of connective tissue, unspecified: Secondary | ICD-10-CM

## 2013-11-01 LAB — CBC WITH DIFFERENTIAL/PLATELET
BASO%: 0.7 % (ref 0.0–2.0)
BASOS ABS: 0 10*3/uL (ref 0.0–0.1)
EOS ABS: 0 10*3/uL (ref 0.0–0.5)
EOS%: 0.3 % (ref 0.0–7.0)
HCT: 33.3 % — ABNORMAL LOW (ref 34.8–46.6)
HGB: 11 g/dL — ABNORMAL LOW (ref 11.6–15.9)
LYMPH#: 0.9 10*3/uL (ref 0.9–3.3)
LYMPH%: 43.8 % (ref 14.0–49.7)
MCH: 30.5 pg (ref 25.1–34.0)
MCHC: 32.9 g/dL (ref 31.5–36.0)
MCV: 92.5 fL (ref 79.5–101.0)
MONO#: 0.2 10*3/uL (ref 0.1–0.9)
MONO%: 8.1 % (ref 0.0–14.0)
NEUT%: 47.1 % (ref 38.4–76.8)
NEUTROS ABS: 0.9 10*3/uL — AB (ref 1.5–6.5)
Platelets: 158 10*3/uL (ref 145–400)
RBC: 3.6 10*6/uL — ABNORMAL LOW (ref 3.70–5.45)
RDW: 15.5 % — AB (ref 11.2–14.5)
WBC: 2 10*3/uL — ABNORMAL LOW (ref 3.9–10.3)

## 2013-11-01 MED ORDER — TBO-FILGRASTIM 480 MCG/0.8ML ~~LOC~~ SOSY
480.0000 ug | PREFILLED_SYRINGE | Freq: Once | SUBCUTANEOUS | Status: AC
  Administered 2013-11-01: 480 ug via SUBCUTANEOUS
  Filled 2013-11-01: qty 0.8

## 2013-11-01 MED ORDER — PREGABALIN 200 MG PO CAPS: ORAL_CAPSULE | ORAL | Status: DC

## 2013-11-01 NOTE — Telephone Encounter (Signed)
lyrica rx written

## 2013-11-01 NOTE — Telephone Encounter (Signed)
gv adn printed appt sched for pt for May

## 2013-11-02 ENCOUNTER — Ambulatory Visit (HOSPITAL_BASED_OUTPATIENT_CLINIC_OR_DEPARTMENT_OTHER): Payer: Commercial Managed Care - HMO

## 2013-11-02 VITALS — BP 84/49 | HR 101 | Temp 98.0°F

## 2013-11-02 DIAGNOSIS — D708 Other neutropenia: Secondary | ICD-10-CM

## 2013-11-02 DIAGNOSIS — D704 Cyclic neutropenia: Principal | ICD-10-CM

## 2013-11-02 DIAGNOSIS — M359 Systemic involvement of connective tissue, unspecified: Secondary | ICD-10-CM

## 2013-11-02 MED ORDER — TBO-FILGRASTIM 480 MCG/0.8ML ~~LOC~~ SOSY
480.0000 ug | PREFILLED_SYRINGE | Freq: Once | SUBCUTANEOUS | Status: AC
  Administered 2013-11-02: 480 ug via SUBCUTANEOUS
  Filled 2013-11-02: qty 0.8

## 2013-11-02 NOTE — Patient Instructions (Signed)
Tbo-Filgrastim injection What is this medicine? TBO-FILGRASTIM is used to help decrease the time you have low amounts of white blood cells after cancer treatment. It helps the body make more white blood cells. Increasing the amount of white blood cells helps to decrease the risk of infection and fever. This medicine may be used for other purposes; ask your health care provider or pharmacist if you have questions. COMMON BRAND NAME(S): Granix What should I tell my health care provider before I take this medicine? They need to know if you have any of these conditions: -history of blood diseases, like sickle cell anemia or leukemia -an unusual or allergic reaction to tbo-filgrastim, filgrastim, pegfilgrastim, other medicines, foods, dyes, or preservatives -pregnant or trying to get pregnant -breast-feeding How should I use this medicine? This medicine is for injection under the skin. It is given by a health care professional in a hospital or clinic setting. Talk to your pediatrician regarding the use of this medicine in children. Special care may be needed. Overdosage: If you think you've taken too much of this medicine contact a poison control center or emergency room at once. Overdosage: If you think you have taken too much of this medicine contact a poison control center or emergency room at once. NOTE: This medicine is only for you. Do not share this medicine with others. What if I miss a dose? Keep appointments for follow-up doses as directed. It is important not to miss your dose. Call your doctor or health care professional if you are unable to keep an appointment. What may interact with this medicine? -lithium This list may not describe all possible interactions. Give your health care provider a list of all the medicines, herbs, non-prescription drugs, or dietary supplements you use. Also tell them if you smoke, drink alcohol, or use illegal drugs. Some items may interact with your  medicine. What should I watch for while using this medicine? Your condition will be monitored carefully while you are receiving this medicine. You may need blood work done while you are taking this medicine. Avoid taking products that contain aspirin, acetaminophen, ibuprofen, naproxen, or ketoprofen unless instructed by your doctor. These medicines may hide a fever. Call your doctor or health care professional for advice if you get a fever, chills or sore throat, or other symptoms of a cold or flu. Do not treat yourself. What side effects may I notice from receiving this medicine? Side effects that you should report to your doctor or health care professional as soon as possible: -allergic reactions like skin rash, itching or hives, swelling of the face, lips, or tongue -breathing problems -fever -stomach pain  Side effects that usually do not require medical attention (Report these to your doctor or health care professional if they continue or are bothersome.): -bone pain This list may not describe all possible side effects. Call your doctor for medical advice about side effects. You may report side effects to FDA at 1-800-FDA-1088. Where should I keep my medicine? This drug is given in a hospital or clinic and will not be stored at home. NOTE: This sheet is a summary. It may not cover all possible information. If you have questions about this medicine, talk to your doctor, pharmacist, or health care provider.  2014, [employer]vier/Gold Standard. (2011-02-12 16:41:24)  

## 2013-11-04 ENCOUNTER — Ambulatory Visit (HOSPITAL_BASED_OUTPATIENT_CLINIC_OR_DEPARTMENT_OTHER): Payer: Medicare HMO

## 2013-11-04 ENCOUNTER — Other Ambulatory Visit (HOSPITAL_BASED_OUTPATIENT_CLINIC_OR_DEPARTMENT_OTHER): Payer: Medicare HMO

## 2013-11-04 ENCOUNTER — Ambulatory Visit (HOSPITAL_BASED_OUTPATIENT_CLINIC_OR_DEPARTMENT_OTHER): Payer: Medicare HMO | Admitting: Hematology and Oncology

## 2013-11-04 ENCOUNTER — Telehealth: Payer: Self-pay | Admitting: Hematology and Oncology

## 2013-11-04 ENCOUNTER — Encounter: Payer: Self-pay | Admitting: Hematology and Oncology

## 2013-11-04 VITALS — BP 90/59 | HR 76 | Temp 97.7°F | Resp 18

## 2013-11-04 VITALS — BP 107/82 | HR 88 | Temp 98.0°F | Resp 18 | Ht 62.0 in | Wt 159.3 lb

## 2013-11-04 DIAGNOSIS — M359 Systemic involvement of connective tissue, unspecified: Secondary | ICD-10-CM

## 2013-11-04 DIAGNOSIS — D649 Anemia, unspecified: Secondary | ICD-10-CM

## 2013-11-04 DIAGNOSIS — M069 Rheumatoid arthritis, unspecified: Secondary | ICD-10-CM

## 2013-11-04 DIAGNOSIS — M0500 Felty's syndrome, unspecified site: Secondary | ICD-10-CM

## 2013-11-04 DIAGNOSIS — Z5112 Encounter for antineoplastic immunotherapy: Secondary | ICD-10-CM

## 2013-11-04 DIAGNOSIS — D704 Cyclic neutropenia: Secondary | ICD-10-CM

## 2013-11-04 DIAGNOSIS — D708 Other neutropenia: Secondary | ICD-10-CM

## 2013-11-04 LAB — CBC WITH DIFFERENTIAL/PLATELET
BASO%: 0.7 % (ref 0.0–2.0)
Basophils Absolute: 0 10*3/uL (ref 0.0–0.1)
EOS ABS: 0 10*3/uL (ref 0.0–0.5)
EOS%: 0.2 % (ref 0.0–7.0)
HEMATOCRIT: 33.3 % — AB (ref 34.8–46.6)
HGB: 11.1 g/dL — ABNORMAL LOW (ref 11.6–15.9)
LYMPH%: 14.4 % (ref 14.0–49.7)
MCH: 30.5 pg (ref 25.1–34.0)
MCHC: 33.3 g/dL (ref 31.5–36.0)
MCV: 91.5 fL (ref 79.5–101.0)
MONO#: 0.2 10*3/uL (ref 0.1–0.9)
MONO%: 5.4 % (ref 0.0–14.0)
NEUT%: 79.3 % — AB (ref 38.4–76.8)
NEUTROS ABS: 3.3 10*3/uL (ref 1.5–6.5)
PLATELETS: 151 10*3/uL (ref 145–400)
RBC: 3.64 10*6/uL — ABNORMAL LOW (ref 3.70–5.45)
RDW: 16.3 % — ABNORMAL HIGH (ref 11.2–14.5)
WBC: 4.1 10*3/uL (ref 3.9–10.3)
lymph#: 0.6 10*3/uL — ABNORMAL LOW (ref 0.9–3.3)

## 2013-11-04 MED ORDER — SODIUM CHLORIDE 0.9 % IV SOLN
375.0000 mg/m2 | Freq: Once | INTRAVENOUS | Status: AC
  Administered 2013-11-04: 700 mg via INTRAVENOUS
  Filled 2013-11-04: qty 70

## 2013-11-04 MED ORDER — SODIUM CHLORIDE 0.9 % IV SOLN
Freq: Once | INTRAVENOUS | Status: AC
  Administered 2013-11-04: 09:00:00 via INTRAVENOUS

## 2013-11-04 MED ORDER — DIPHENHYDRAMINE HCL 25 MG PO CAPS
50.0000 mg | ORAL_CAPSULE | Freq: Once | ORAL | Status: AC
  Administered 2013-11-04: 50 mg via ORAL

## 2013-11-04 MED ORDER — DIPHENHYDRAMINE HCL 25 MG PO CAPS
ORAL_CAPSULE | ORAL | Status: AC
  Filled 2013-11-04: qty 2

## 2013-11-04 MED ORDER — ACETAMINOPHEN 325 MG PO TABS
ORAL_TABLET | ORAL | Status: AC
  Filled 2013-11-04: qty 2

## 2013-11-04 MED ORDER — ACETAMINOPHEN 325 MG PO TABS
650.0000 mg | ORAL_TABLET | Freq: Once | ORAL | Status: AC
  Administered 2013-11-04: 650 mg via ORAL

## 2013-11-04 NOTE — Telephone Encounter (Signed)
gv and printed appt sched adn avs for pt for June °

## 2013-11-04 NOTE — Patient Instructions (Signed)
[facility] Discharge Instructions for Patients Receiving Chemotherapy  Today you received the following chemotherapy agents: Rituxan. To help prevent nausea and vomiting after your treatment, we encourage you to take your nausea medication.   If you develop nausea and vomiting that is not controlled by your nausea medication, call the clinic.   BELOW ARE SYMPTOMS THAT SHOULD BE REPORTED IMMEDIATELY:  *FEVER GREATER THAN 100.5 F  *CHILLS WITH OR WITHOUT FEVER  NAUSEA AND VOMITING THAT IS NOT CONTROLLED WITH YOUR NAUSEA MEDICATION  *UNUSUAL SHORTNESS OF BREATH  *UNUSUAL BRUISING OR BLEEDING  TENDERNESS IN MOUTH AND THROAT WITH OR WITHOUT PRESENCE OF ULCERS  *URINARY PROBLEMS  *BOWEL PROBLEMS  UNUSUAL RASH Items with * indicate a potential emergency and should be followed up as soon as possible.  Feel free to call the clinic you have any questions or concerns. The clinic phone number is [phone].    

## 2013-11-04 NOTE — Progress Notes (Signed)
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 Bone marrow aspirate and biopsy was performed in 2002 and 2004 with no abnormalities found. The patient was treated with G-CSF with good response. She was subsequently started on methotrexate weekly along with prednisone.  On 09/09/2013, methotrexate was discontinued due to severe neutropenia. She was started on G-CSF injection daily. On 10/14/2013, she was started on weekly rituximab. #2 Chronic anemia The patient had history of GI bleed in 2012 are negative endoscopy and colonoscopy.  INTERVAL HISTORY: [name] 63 y.o. female returns for further followup. Her chronic nonhealing wound in the right lower extremity is almost completely resolved. She denies any flare of rheumatoid arthritis. She remained on 10 mg of prednisone daily and is on a taper course. She is rating his joint pain at 7/10 pain but it is tolerable. She denies any recent fever, chills, night sweats or abnormal weight loss  I have reviewed the past medical history, past surgical history, social history and family history with the patient and they are unchanged from previous note.  ALLERGIES:  is allergic to penicillins.  MEDICATIONS:  Current Outpatient Prescriptions  Medication Sig Dispense Refill  . Ascorbic Acid (VITAMIN C) 1000 MG tablet Take 1,000 mg by mouth daily.      . calcium carbonate (CALCIUM 600) 600 MG TABS Take 600 mg by mouth 2 (two) times daily with a meal.        . Cholecalciferol (VITAMIN D) 1000 UNITS capsule Take 1,000 Units by  mouth daily.       . cyclobenzaprine (FLEXERIL) 10 MG tablet Take 1 tablet (10 mg total) by mouth 3 (three) times daily as needed for muscle spasms.  90 tablet  2  . diclofenac sodium (VOLTAREN) 1 % GEL Apply 2 g topically 4 (four) times daily.  3 Tube  2  . DULoxetine (CYMBALTA) 60 MG capsule TAKE ONE CAPSULE BY MOUTH TWICE DAILY  180 capsule  3  . folic acid (FOLVITE) 1 MG tablet Take 1 mg by mouth daily.      Marland Kitchen HYDROcodone-acetaminophen (NORCO) 7.5-325 MG per tablet Take 1 tablet by mouth every 4 (four) hours as needed. Max 6 per day  180 tablet  0  . Melatonin 10 MG CAPS Take 1 tablet by mouth at bedtime.      . nortriptyline (PAMELOR) 50 MG capsule TAKE TWO CAPSULES BY MOUTH AT BEDTIME  60 capsule  0  . omeprazole (PRILOSEC) 20 MG capsule Take 20 mg by mouth 2 (two) times daily.       . predniSONE (DELTASONE) 10 MG tablet TAKE DAILY BY MOUTH AS DIRECTED BY PHYSICIAN.  60 tablet  3  . pregabalin (LYRICA) 200 MG capsule TAKE 1 CAPSULE BY MOUTH TWICE DAILY  *90 day supply*  180 capsule  3  . Probiotic Product (PROBIOTIC FORMULA) CAPS Take 1 capsule by mouth daily.        . vitamin B-12 (CYANOCOBALAMIN) 1000 MCG tablet Take 1,000 mcg by mouth every other day.       . zinc gluconate 50 MG tablet Take 100 mg by mouth daily.      . Alum & Mag Hydroxide-Simeth (  MAGIC MOUTHWASH W/LIDOCAINE) SOLN Take 5 mLs by mouth 3 (three) times daily as needed.  240 mL  0  . [DISCONTINUED] POTASSIUM PO Take 20 mEq by mouth 2 (two) times daily.       No current facility-administered medications for this visit.     REVIEW OF SYSTEMS:   Constitutional: Denies fevers, chills or night sweats Eyes: Denies blurriness of vision Ears, nose, mouth, throat, and face: Denies mucositis or sore throat Respiratory: Denies cough, dyspnea or wheezes Cardiovascular: Denies palpitation, chest discomfort or lower extremity swelling Gastrointestinal:  Denies nausea, heartburn or change in bowel habits Skin: Denies abnormal  skin rashes Lymphatics: Denies new lymphadenopathy or easy bruising Neurological:Denies numbness, tingling or new weaknesses Behavioral/Psych: Mood is stable, no new changes  All other systems were reviewed with the patient and are negative.  PHYSICAL EXAMINATION: ECOG PERFORMANCE STATUS: 0 - Asymptomatic  Filed Vitals:   11/04/13 0825  BP: 107/82  Pulse: 88  Temp: 98 F (36.7 C)  Resp: 18   Filed Weights   11/04/13 0825  Weight: 159 lb 4.8 oz (72.258 kg)    GENERAL:alert, no distress and comfortable. She looked mildly cushingoid SKIN: skin color, texture, turgor are normal, no rashes or significant lesions. Noticed some bruises EYES: normal, Conjunctiva are pink and non-injected, sclera clear OROPHARYNX:no exudate, no erythema and lips, buccal mucosa, and tongue normal  NECK: supple, thyroid normal size, non-tender, without nodularity LYMPH:  no palpable lymphadenopathy in the cervical, axillary or inguinal LUNGS: clear to auscultation and percussion with normal breathing effort HEART: regular rate & rhythm and no murmurs and no lower extremity edema ABDOMEN:abdomen soft, non-tender and normal bowel sounds Musculoskeletal:no cyanosis of digits and no clubbing . Noted degenerative joint disease consistent with rheumatoid arthritis. NEURO: alert & oriented x 3 with fluent speech, no focal motor/sensory deficits  LABORATORY DATA:  I have reviewed the data as listed Results for orders placed in visit on 11/04/13 (from the past 48 hour(s))  CBC WITH DIFFERENTIAL     Status: Abnormal   Collection Time    11/04/13  8:05 AM      Result Value Ref Range   WBC 4.1  3.9 - 10.3 10e3/uL   NEUT# 3.3  1.5 - 6.5 10e3/uL   HGB 11.1 (*) 11.6 - 15.9 g/dL   HCT 33.3 (*) 34.8 - 46.6 %   Platelets 151  145 - 400 10e3/uL   MCV 91.5  79.5 - 101.0 fL   MCH 30.5  25.1 - 34.0 pg   MCHC 33.3  31.5 - 36.0 g/dL   RBC 3.64 (*) 3.70 - 5.45 10e6/uL   RDW 16.3 (*) 11.2 - 14.5 %   lymph# 0.6 (*) 0.9  - 3.3 10e3/uL   MONO# 0.2  0.1 - 0.9 10e3/uL   Eosinophils Absolute 0.0  0.0 - 0.5 10e3/uL   Basophils Absolute 0.0  0.0 - 0.1 10e3/uL   NEUT% 79.3 (*) 38.4 - 76.8 %   LYMPH% 14.4  14.0 - 49.7 %   MONO% 5.4  0.0 - 14.0 %   EOS% 0.2  0.0 - 7.0 %   BASO% 0.7  0.0 - 2.0 %    Lab Results  Component Value Date   WBC 4.1 11/04/2013   HGB 11.1* 11/04/2013   HCT 33.3* 11/04/2013   MCV 91.5 11/04/2013   PLT 151 11/04/2013   ASSESSMENT & PLAN:  #1 severe autoimmune neutropenia #2 rheumatoid arthritis She is responding well to G-CSF.  The patient has  failed treatment with corticosteroids and methotrexate. She was started on weekly rituximab 3 weeks ago and is tolerating treatment well. The patient is aware response rates will not be seen and 268 weeks after her last infusion. The patient will continue intermittent G-CSF to treat severe neutropenia. She remained on 10 mg prednisone for now due to severe joint pain. After her last dose of rituximab today, the patient will continue weekly CBC followup over the next several weeks. #3 chronic nonhealing wound For her chronic nonhealing wound, I recommend continue dressing changes and followup at the wound care center. Overall this is improving. #4 anemia This is likely anemia of chronic disease. The patient denies recent history of bleeding such as epistaxis, hematuria or hematochezia. She is asymptomatic from the anemia. We will observe for now.  She does not require transfusion now.   All questions were answered. The patient knows to call the clinic with any problems, questions or concerns. No barriers to learning was detected.  I spent 25 minutes counseling the patient face to face. The total time spent in the appointment was 30 minutes and more than 50% was on counseling.     Heath Lark, MD 11/04/2013 8:48 AM

## 2013-11-08 ENCOUNTER — Ambulatory Visit (HOSPITAL_BASED_OUTPATIENT_CLINIC_OR_DEPARTMENT_OTHER): Payer: Commercial Managed Care - HMO

## 2013-11-08 ENCOUNTER — Other Ambulatory Visit (HOSPITAL_BASED_OUTPATIENT_CLINIC_OR_DEPARTMENT_OTHER): Payer: Commercial Managed Care - HMO

## 2013-11-08 ENCOUNTER — Other Ambulatory Visit: Payer: Self-pay | Admitting: *Deleted

## 2013-11-08 ENCOUNTER — Other Ambulatory Visit: Payer: Self-pay | Admitting: Hematology and Oncology

## 2013-11-08 VITALS — BP 95/67 | HR 78 | Temp 98.2°F | Resp 18

## 2013-11-08 DIAGNOSIS — D704 Cyclic neutropenia: Principal | ICD-10-CM

## 2013-11-08 DIAGNOSIS — D708 Other neutropenia: Secondary | ICD-10-CM

## 2013-11-08 DIAGNOSIS — M359 Systemic involvement of connective tissue, unspecified: Secondary | ICD-10-CM

## 2013-11-08 DIAGNOSIS — M0500 Felty's syndrome, unspecified site: Secondary | ICD-10-CM

## 2013-11-08 LAB — CBC WITH DIFFERENTIAL/PLATELET
BASO%: 2.6 % — ABNORMAL HIGH (ref 0.0–2.0)
BASOS ABS: 0.1 10*3/uL (ref 0.0–0.1)
EOS%: 0.7 % (ref 0.0–7.0)
Eosinophils Absolute: 0 10*3/uL (ref 0.0–0.5)
HEMATOCRIT: 35.2 % (ref 34.8–46.6)
HGB: 11.8 g/dL (ref 11.6–15.9)
LYMPH#: 1 10*3/uL (ref 0.9–3.3)
LYMPH%: 44.4 % (ref 14.0–49.7)
MCH: 31.2 pg (ref 25.1–34.0)
MCHC: 33.6 g/dL (ref 31.5–36.0)
MCV: 92.8 fL (ref 79.5–101.0)
MONO#: 0.7 10*3/uL (ref 0.1–0.9)
MONO%: 34.1 % — ABNORMAL HIGH (ref 0.0–14.0)
NEUT#: 0.4 10*3/uL — CL (ref 1.5–6.5)
NEUT%: 18.2 % — AB (ref 38.4–76.8)
PLATELETS: 203 10*3/uL (ref 145–400)
RBC: 3.79 10*6/uL (ref 3.70–5.45)
RDW: 16.2 % — ABNORMAL HIGH (ref 11.2–14.5)
WBC: 2.2 10*3/uL — AB (ref 3.9–10.3)

## 2013-11-08 MED ORDER — TBO-FILGRASTIM 480 MCG/0.8ML ~~LOC~~ SOSY
480.0000 ug | PREFILLED_SYRINGE | Freq: Every day | SUBCUTANEOUS | Status: DC | PRN
  Administered 2013-11-08: 480 ug via SUBCUTANEOUS
  Filled 2013-11-08 (×2): qty 0.8

## 2013-11-09 ENCOUNTER — Ambulatory Visit (HOSPITAL_BASED_OUTPATIENT_CLINIC_OR_DEPARTMENT_OTHER): Payer: Commercial Managed Care - HMO

## 2013-11-09 VITALS — BP 97/53 | HR 88 | Temp 98.4°F

## 2013-11-09 DIAGNOSIS — D704 Cyclic neutropenia: Principal | ICD-10-CM

## 2013-11-09 DIAGNOSIS — M359 Systemic involvement of connective tissue, unspecified: Secondary | ICD-10-CM

## 2013-11-09 DIAGNOSIS — D708 Other neutropenia: Secondary | ICD-10-CM

## 2013-11-09 MED ORDER — TBO-FILGRASTIM 480 MCG/0.8ML ~~LOC~~ SOSY
480.0000 ug | PREFILLED_SYRINGE | Freq: Every day | SUBCUTANEOUS | Status: DC | PRN
  Administered 2013-11-09: 480 ug via SUBCUTANEOUS
  Filled 2013-11-09: qty 0.8

## 2013-11-11 ENCOUNTER — Other Ambulatory Visit: Payer: Medicare HMO

## 2013-11-11 ENCOUNTER — Telehealth: Payer: Self-pay

## 2013-11-11 NOTE — Telephone Encounter (Signed)
Patient called about a form she dropped off for [employer] benefits for her duloxetine.  Advised patient form had been faxed.  She said she was aware and that the medication had been ordered.

## 2013-11-14 ENCOUNTER — Other Ambulatory Visit: Payer: Self-pay | Admitting: Hematology and Oncology

## 2013-11-14 DIAGNOSIS — M0500 Felty's syndrome, unspecified site: Secondary | ICD-10-CM

## 2013-11-15 ENCOUNTER — Other Ambulatory Visit: Payer: Self-pay

## 2013-11-15 ENCOUNTER — Telehealth: Payer: Self-pay | Admitting: *Deleted

## 2013-11-15 ENCOUNTER — Other Ambulatory Visit: Payer: Commercial Managed Care - HMO

## 2013-11-15 MED ORDER — NORTRIPTYLINE HCL 50 MG PO CAPS: ORAL_CAPSULE | ORAL | Status: DC

## 2013-11-15 NOTE — Telephone Encounter (Signed)
Pt missed her lab appt this morning and requested it be r/s to tomorrow morning.  Lab r/s and pt aware.

## 2013-11-16 ENCOUNTER — Other Ambulatory Visit (HOSPITAL_BASED_OUTPATIENT_CLINIC_OR_DEPARTMENT_OTHER): Payer: Commercial Managed Care - HMO

## 2013-11-16 DIAGNOSIS — M0500 Felty's syndrome, unspecified site: Secondary | ICD-10-CM

## 2013-11-16 LAB — CBC WITH DIFFERENTIAL/PLATELET
BASO%: 1 % (ref 0.0–2.0)
Basophils Absolute: 0 10*3/uL (ref 0.0–0.1)
EOS ABS: 0 10*3/uL (ref 0.0–0.5)
EOS%: 0.4 % (ref 0.0–7.0)
HCT: 38.7 % (ref 34.8–46.6)
HGB: 12.9 g/dL (ref 11.6–15.9)
LYMPH%: 22.6 % (ref 14.0–49.7)
MCH: 31.1 pg (ref 25.1–34.0)
MCHC: 33.3 g/dL (ref 31.5–36.0)
MCV: 93.3 fL (ref 79.5–101.0)
MONO#: 0.3 10*3/uL (ref 0.1–0.9)
MONO%: 12.1 % (ref 0.0–14.0)
NEUT%: 63.9 % (ref 38.4–76.8)
NEUTROS ABS: 1.6 10*3/uL (ref 1.5–6.5)
PLATELETS: 165 10*3/uL (ref 145–400)
RBC: 4.15 10*6/uL (ref 3.70–5.45)
RDW: 15.7 % — ABNORMAL HIGH (ref 11.2–14.5)
WBC: 2.5 10*3/uL — ABNORMAL LOW (ref 3.9–10.3)
lymph#: 0.6 10*3/uL — ABNORMAL LOW (ref 0.9–3.3)

## 2013-11-22 ENCOUNTER — Other Ambulatory Visit (HOSPITAL_BASED_OUTPATIENT_CLINIC_OR_DEPARTMENT_OTHER): Payer: Commercial Managed Care - HMO

## 2013-11-22 DIAGNOSIS — D649 Anemia, unspecified: Secondary | ICD-10-CM

## 2013-11-22 DIAGNOSIS — M0500 Felty's syndrome, unspecified site: Secondary | ICD-10-CM

## 2013-11-22 LAB — CBC WITH DIFFERENTIAL/PLATELET
BASO%: 0.6 % (ref 0.0–2.0)
Basophils Absolute: 0 10*3/uL (ref 0.0–0.1)
EOS%: 0.9 % (ref 0.0–7.0)
Eosinophils Absolute: 0 10*3/uL (ref 0.0–0.5)
HCT: 37.7 % (ref 34.8–46.6)
HGB: 12.4 g/dL (ref 11.6–15.9)
LYMPH%: 23.7 % (ref 14.0–49.7)
MCH: 30.8 pg (ref 25.1–34.0)
MCHC: 32.9 g/dL (ref 31.5–36.0)
MCV: 93.5 fL (ref 79.5–101.0)
MONO#: 0.4 10*3/uL (ref 0.1–0.9)
MONO%: 11.8 % (ref 0.0–14.0)
NEUT#: 2 10*3/uL (ref 1.5–6.5)
NEUT%: 63 % (ref 38.4–76.8)
PLATELETS: 168 10*3/uL (ref 145–400)
RBC: 4.03 10*6/uL (ref 3.70–5.45)
RDW: 15.2 % — ABNORMAL HIGH (ref 11.2–14.5)
WBC: 3.2 10*3/uL — ABNORMAL LOW (ref 3.9–10.3)
lymph#: 0.8 10*3/uL — ABNORMAL LOW (ref 0.9–3.3)

## 2013-11-29 ENCOUNTER — Other Ambulatory Visit: Payer: Self-pay | Admitting: Hematology and Oncology

## 2013-11-29 ENCOUNTER — Other Ambulatory Visit (HOSPITAL_BASED_OUTPATIENT_CLINIC_OR_DEPARTMENT_OTHER): Payer: Commercial Managed Care - HMO

## 2013-11-29 ENCOUNTER — Ambulatory Visit (HOSPITAL_BASED_OUTPATIENT_CLINIC_OR_DEPARTMENT_OTHER): Payer: Commercial Managed Care - HMO

## 2013-11-29 VITALS — BP 93/64 | HR 87 | Temp 98.8°F

## 2013-11-29 DIAGNOSIS — M0500 Felty's syndrome, unspecified site: Secondary | ICD-10-CM

## 2013-11-29 DIAGNOSIS — D708 Other neutropenia: Secondary | ICD-10-CM

## 2013-11-29 DIAGNOSIS — D704 Cyclic neutropenia: Principal | ICD-10-CM

## 2013-11-29 DIAGNOSIS — M359 Systemic involvement of connective tissue, unspecified: Secondary | ICD-10-CM

## 2013-11-29 LAB — CBC WITH DIFFERENTIAL/PLATELET
BASO%: 1.2 % (ref 0.0–2.0)
Basophils Absolute: 0 10*3/uL (ref 0.0–0.1)
EOS%: 1.2 % (ref 0.0–7.0)
Eosinophils Absolute: 0 10*3/uL (ref 0.0–0.5)
HCT: 38.8 % (ref 34.8–46.6)
HGB: 13 g/dL (ref 11.6–15.9)
LYMPH#: 0.8 10*3/uL — AB (ref 0.9–3.3)
LYMPH%: 46.9 % (ref 14.0–49.7)
MCH: 31.3 pg (ref 25.1–34.0)
MCHC: 33.5 g/dL (ref 31.5–36.0)
MCV: 93.5 fL (ref 79.5–101.0)
MONO#: 0.4 10*3/uL (ref 0.1–0.9)
MONO%: 22.8 % — ABNORMAL HIGH (ref 0.0–14.0)
NEUT#: 0.5 10*3/uL — CL (ref 1.5–6.5)
NEUT%: 27.9 % — ABNORMAL LOW (ref 38.4–76.8)
NRBC: 0 % (ref 0–0)
Platelets: 138 10*3/uL — ABNORMAL LOW (ref 145–400)
RBC: 4.15 10*6/uL (ref 3.70–5.45)
RDW: 14.7 % — ABNORMAL HIGH (ref 11.2–14.5)
WBC: 1.6 10*3/uL — AB (ref 3.9–10.3)

## 2013-11-29 MED ORDER — TBO-FILGRASTIM 480 MCG/0.8ML ~~LOC~~ SOSY
480.0000 ug | PREFILLED_SYRINGE | Freq: Once | SUBCUTANEOUS | Status: AC
  Administered 2013-11-29: 480 ug via SUBCUTANEOUS
  Filled 2013-11-29: qty 0.8

## 2013-11-30 ENCOUNTER — Ambulatory Visit (HOSPITAL_BASED_OUTPATIENT_CLINIC_OR_DEPARTMENT_OTHER): Payer: Commercial Managed Care - HMO

## 2013-11-30 VITALS — BP 104/46 | HR 95 | Temp 98.5°F

## 2013-11-30 DIAGNOSIS — D708 Other neutropenia: Secondary | ICD-10-CM

## 2013-11-30 DIAGNOSIS — D704 Cyclic neutropenia: Principal | ICD-10-CM

## 2013-11-30 DIAGNOSIS — M359 Systemic involvement of connective tissue, unspecified: Secondary | ICD-10-CM

## 2013-11-30 MED ORDER — TBO-FILGRASTIM 480 MCG/0.8ML ~~LOC~~ SOSY
480.0000 ug | PREFILLED_SYRINGE | Freq: Once | SUBCUTANEOUS | Status: AC
  Administered 2013-11-30: 480 ug via SUBCUTANEOUS
  Filled 2013-11-30: qty 0.8

## 2013-12-05 ENCOUNTER — Other Ambulatory Visit: Payer: Self-pay

## 2013-12-05 ENCOUNTER — Telehealth: Payer: Self-pay | Admitting: *Deleted

## 2013-12-05 MED ORDER — PREGABALIN 200 MG PO CAPS: ORAL_CAPSULE | ORAL | Status: DC

## 2013-12-05 NOTE — Telephone Encounter (Signed)
[facility] called to inform us that the Lyrica rx sent to them last month was too dark to read and they will need to have a new one faxed to [phone] [facility].

## 2013-12-05 NOTE — Telephone Encounter (Signed)
[employer] patient assistance program called.  They need a new lyrica rx faxed to them.  The other was to dark to read.  New rx faxed to [place] at [phone].

## 2013-12-05 NOTE — Telephone Encounter (Signed)
New lyrica rx faxed to [facility] [employer].

## 2013-12-06 ENCOUNTER — Ambulatory Visit (HOSPITAL_BASED_OUTPATIENT_CLINIC_OR_DEPARTMENT_OTHER): Payer: Commercial Managed Care - HMO | Admitting: Hematology and Oncology

## 2013-12-06 ENCOUNTER — Other Ambulatory Visit (HOSPITAL_BASED_OUTPATIENT_CLINIC_OR_DEPARTMENT_OTHER): Payer: Commercial Managed Care - HMO

## 2013-12-06 ENCOUNTER — Encounter: Payer: Self-pay | Admitting: Hematology and Oncology

## 2013-12-06 VITALS — BP 98/72 | HR 87 | Temp 98.1°F | Resp 18 | Ht 62.0 in | Wt 165.4 lb

## 2013-12-06 DIAGNOSIS — D704 Cyclic neutropenia: Secondary | ICD-10-CM

## 2013-12-06 DIAGNOSIS — L97911 Non-pressure chronic ulcer of unspecified part of right lower leg limited to breakdown of skin: Secondary | ICD-10-CM

## 2013-12-06 DIAGNOSIS — M359 Systemic involvement of connective tissue, unspecified: Secondary | ICD-10-CM

## 2013-12-06 DIAGNOSIS — D708 Other neutropenia: Secondary | ICD-10-CM

## 2013-12-06 DIAGNOSIS — M0500 Felty's syndrome, unspecified site: Secondary | ICD-10-CM

## 2013-12-06 DIAGNOSIS — M069 Rheumatoid arthritis, unspecified: Secondary | ICD-10-CM

## 2013-12-06 DIAGNOSIS — L97909 Non-pressure chronic ulcer of unspecified part of unspecified lower leg with unspecified severity: Secondary | ICD-10-CM

## 2013-12-06 DIAGNOSIS — D696 Thrombocytopenia, unspecified: Secondary | ICD-10-CM

## 2013-12-06 LAB — CBC WITH DIFFERENTIAL/PLATELET
BASO%: 0.5 % (ref 0.0–2.0)
BASOS ABS: 0 10*3/uL (ref 0.0–0.1)
EOS%: 0.6 % (ref 0.0–7.0)
Eosinophils Absolute: 0 10*3/uL (ref 0.0–0.5)
HCT: 35.9 % (ref 34.8–46.6)
HEMOGLOBIN: 12.1 g/dL (ref 11.6–15.9)
LYMPH#: 0.7 10*3/uL — AB (ref 0.9–3.3)
LYMPH%: 22.4 % (ref 14.0–49.7)
MCH: 31.4 pg (ref 25.1–34.0)
MCHC: 33.8 g/dL (ref 31.5–36.0)
MCV: 92.9 fL (ref 79.5–101.0)
MONO#: 0.2 10*3/uL (ref 0.1–0.9)
MONO%: 8.2 % (ref 0.0–14.0)
NEUT#: 2 10*3/uL (ref 1.5–6.5)
NEUT%: 68.3 % (ref 38.4–76.8)
Platelets: 129 10*3/uL — ABNORMAL LOW (ref 145–400)
RBC: 3.87 10*6/uL (ref 3.70–5.45)
RDW: 14.1 % (ref 11.2–14.5)
WBC: 3 10*3/uL — ABNORMAL LOW (ref 3.9–10.3)

## 2013-12-06 MED ORDER — PREDNISONE 5 MG PO TABS: 7.5000 mg | ORAL_TABLET | Freq: Every day | ORAL | Status: DC

## 2013-12-06 NOTE — Progress Notes (Signed)
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 Bone marrow aspirate and biopsy was performed in 2002 and 2004 with no abnormalities found. The patient was treated with G-CSF with good response. She was subsequently started on methotrexate weekly along with prednisone.  On 09/09/2013, methotrexate was discontinued due to severe neutropenia. She was started on G-CSF injection daily. On 10/14/2013, she was started on weekly rituximab. #2 Chronic anemia The patient had history of GI bleed in 2012 are negative endoscopy and colonoscopy.  INTERVAL HISTORY: [name] 63 y.o. female returns for further followup. Her chronic nonhealing wound in the right lower extremity is almost completely resolved. She denies any flare of rheumatoid arthritis. She remained on 10 mg of prednisone daily and is on a taper course. She is rating his joint pain at 7/10 pain but it is tolerable. She denies any recent fever, chills, night sweats or abnormal weight loss Denies any recent infection. I have reviewed the past medical history, past surgical history, social history and family history with the patient and they are unchanged from previous note.  ALLERGIES:  is allergic to penicillins.  MEDICATIONS:  Current Outpatient Prescriptions  Medication Sig Dispense Refill  . Alum & Mag Hydroxide-Simeth (MAGIC MOUTHWASH W/LIDOCAINE) SOLN Take 5 mLs by mouth 3 (three) times daily as needed.  240 mL  0  . Ascorbic Acid (VITAMIN C) 1000 MG tablet Take 1,000 mg by mouth daily.      . calcium carbonate (CALCIUM 600) 600 MG TABS Take 600 mg by mouth 2 (two) times daily with a meal.        . Cholecalciferol (VITAMIN D) 1000 UNITS  capsule Take 1,000 Units by mouth daily.       . cyclobenzaprine (FLEXERIL) 10 MG tablet Take 1 tablet (10 mg total) by mouth 3 (three) times daily as needed for muscle spasms.  90 tablet  2  . diclofenac sodium (VOLTAREN) 1 % GEL Apply 2 g topically 4 (four) times daily.  3 Tube  2  . DULoxetine (CYMBALTA) 60 MG capsule TAKE ONE CAPSULE BY MOUTH TWICE DAILY  180 capsule  3  . folic acid (FOLVITE) 1 MG tablet Take 1 mg by mouth daily.      Marland Kitchen HYDROcodone-acetaminophen (NORCO) 7.5-325 MG per tablet Take 1 tablet by mouth every 4 (four) hours as needed. Max 6 per day  180 tablet  0  . Melatonin 10 MG CAPS Take 5 mg by mouth at bedtime.       . nortriptyline (PAMELOR) 50 MG capsule TAKE TWO CAPSULES BY MOUTH AT BEDTIME  60 capsule  3  . omeprazole (PRILOSEC) 20 MG capsule Take 20 mg by mouth daily.       . predniSONE (DELTASONE) 10 MG tablet TAKE DAILY BY MOUTH AS DIRECTED BY PHYSICIAN.  60 tablet  3  . pregabalin (LYRICA) 200 MG capsule TAKE 1 CAPSULE BY MOUTH TWICE DAILY  *90 day supply*  180 capsule  3  . Probiotic Product (PROBIOTIC FORMULA) CAPS Take 1 capsule by mouth daily.        . vitamin B-12 (CYANOCOBALAMIN) 1000 MCG tablet Take 1,000 mcg by mouth every other day.       . zinc gluconate 50 MG tablet Take 100 mg by  mouth daily.      . predniSONE (DELTASONE) 5 MG tablet Take 1.5 tablets (7.5 mg total) by mouth daily with breakfast.  90 tablet  3  . [DISCONTINUED] POTASSIUM PO Take 20 mEq by mouth 2 (two) times daily.       No current facility-administered medications for this visit.     REVIEW OF SYSTEMS:   Constitutional: Denies fevers, chills or night sweats Eyes: Denies blurriness of vision Ears, nose, mouth, throat, and face: Denies mucositis or sore throat Respiratory: Denies cough, dyspnea or wheezes Cardiovascular: Denies palpitation, chest discomfort or lower extremity swelling Gastrointestinal:  Denies nausea, heartburn or change in bowel habits Skin: Denies abnormal skin  rashes Lymphatics: Denies new lymphadenopathy or easy bruising Neurological:Denies numbness, tingling or new weaknesses Behavioral/Psych: Mood is stable, no new changes  All other systems were reviewed with the patient and are negative.  PHYSICAL EXAMINATION: ECOG PERFORMANCE STATUS: 0 - Asymptomatic  Filed Vitals:   12/06/13 0928  BP: 98/72  Pulse: 87  Temp: 98.1 F (36.7 C)  Resp: 18   Filed Weights   12/06/13 0928  Weight: 165 lb 6.4 oz (75.025 kg)    GENERAL:alert, no distress and comfortable SKIN: skin color, texture, turgor are normal, no rashes or significant lesions. Persistent nonhealing wound in her right leg. EYES: normal, Conjunctiva are pink and non-injected, sclera clear OROPHARYNX:no exudate, no erythema and lips, buccal mucosa, and tongue normal  Musculoskeletal:no cyanosis of digits and no clubbing . She has stigmata of rheumatoid arthritis. NEURO: alert & oriented x 3 with fluent speech, no focal motor/sensory deficits  LABORATORY DATA:  I have reviewed the data as listed Results for orders placed in visit on 12/06/13 (from the past 48 hour(s))  CBC WITH DIFFERENTIAL     Status: Abnormal   Collection Time    12/06/13  9:17 AM      Result Value Ref Range   WBC 3.0 (*) 3.9 - 10.3 10e3/uL   NEUT# 2.0  1.5 - 6.5 10e3/uL   HGB 12.1  11.6 - 15.9 g/dL   HCT 35.9  34.8 - 46.6 %   Platelets 129 (*) 145 - 400 10e3/uL   MCV 92.9  79.5 - 101.0 fL   MCH 31.4  25.1 - 34.0 pg   MCHC 33.8  31.5 - 36.0 g/dL   RBC 3.87  3.70 - 5.45 10e6/uL   RDW 14.1  11.2 - 14.5 %   lymph# 0.7 (*) 0.9 - 3.3 10e3/uL   MONO# 0.2  0.1 - 0.9 10e3/uL   Eosinophils Absolute 0.0  0.0 - 0.5 10e3/uL   Basophils Absolute 0.0  0.0 - 0.1 10e3/uL   NEUT% 68.3  38.4 - 76.8 %   LYMPH% 22.4  14.0 - 49.7 %   MONO% 8.2  0.0 - 14.0 %   EOS% 0.6  0.0 - 7.0 %   BASO% 0.5  0.0 - 2.0 %    Lab Results  Component Value Date   WBC 3.0* 12/06/2013   HGB 12.1 12/06/2013   HCT 35.9 12/06/2013   MCV 92.9  12/06/2013   PLT 129* 12/06/2013    ASSESSMENT & PLAN:  Felty's syndrome She had received 4 doses of weekly rituximab a month ago. Continue to monitor her her blood counts carefully.  Neutropenia associated with autoimmune disease She will continue to return here to have her blood checked every week. If she developed neutropenia with ANC less than 1000, she will receive Granix injection two days in a  row  Rheumatoid arthritis This is stable. I recommend reducing prednisone to 10 mg alternate 7.5 mg every other day. She has no recent flare of arthritis.  nonhealing ulcer of leg Continue wound dressing. This is much improve compared to several months ago.  Thrombocytopenia, unspecified The cause is unknown. It is mild and there is little change compared from previous platelet count. The patient denies recent history of bleeding such as epistaxis, hematuria or hematochezia. She is asymptomatic from the thrombocytopenia. I will observe for now.        All questions were answered. The patient knows to call the clinic with any problems, questions or concerns. No barriers to learning was detected.  I spent 25 minutes counseling the patient face to face. The total time spent in the appointment was 30 minutes and more than 50% was on counseling.     [place], [place], MD 12/06/2013 1:40 PM

## 2013-12-06 NOTE — Assessment & Plan Note (Signed)
The cause is unknown. It is mild and there is little change compared from previous platelet count. The patient denies recent history of bleeding such as epistaxis, hematuria or hematochezia. She is asymptomatic from the thrombocytopenia. I will observe for now.  

## 2013-12-06 NOTE — Assessment & Plan Note (Signed)
Continue wound dressing. This is much improve compared to several months ago.

## 2013-12-06 NOTE — Assessment & Plan Note (Signed)
This is stable. I recommend reducing prednisone to 10 mg alternate 7.5 mg every other day. She has no recent flare of arthritis.

## 2013-12-06 NOTE — Assessment & Plan Note (Signed)
She will continue to return here to have her blood checked every week. If she developed neutropenia with ANC less than 1000, she will receive Granix injection two days in a row

## 2013-12-06 NOTE — Assessment & Plan Note (Signed)
She had received 4 doses of weekly rituximab a month ago. Continue to monitor her her blood counts carefully.

## 2013-12-07 ENCOUNTER — Telehealth: Payer: Self-pay | Admitting: Hematology and Oncology

## 2013-12-07 NOTE — Telephone Encounter (Signed)
, °

## 2013-12-13 ENCOUNTER — Other Ambulatory Visit (HOSPITAL_BASED_OUTPATIENT_CLINIC_OR_DEPARTMENT_OTHER): Payer: Commercial Managed Care - HMO

## 2013-12-13 ENCOUNTER — Telehealth: Payer: Self-pay | Admitting: *Deleted

## 2013-12-13 DIAGNOSIS — M0500 Felty's syndrome, unspecified site: Secondary | ICD-10-CM

## 2013-12-13 DIAGNOSIS — D649 Anemia, unspecified: Secondary | ICD-10-CM

## 2013-12-13 LAB — CBC WITH DIFFERENTIAL/PLATELET
BASO%: 1.1 % (ref 0.0–2.0)
Basophils Absolute: 0 10*3/uL (ref 0.0–0.1)
EOS ABS: 0 10*3/uL (ref 0.0–0.5)
EOS%: 0.6 % (ref 0.0–7.0)
HCT: 36.5 % (ref 34.8–46.6)
HGB: 12.5 g/dL (ref 11.6–15.9)
LYMPH%: 20.6 % (ref 14.0–49.7)
MCH: 32.1 pg (ref 25.1–34.0)
MCHC: 34.3 g/dL (ref 31.5–36.0)
MCV: 93.7 fL (ref 79.5–101.0)
MONO#: 0.3 10*3/uL (ref 0.1–0.9)
MONO%: 7.8 % (ref 0.0–14.0)
NEUT%: 69.9 % (ref 38.4–76.8)
NEUTROS ABS: 2.5 10*3/uL (ref 1.5–6.5)
Platelets: 169 10*3/uL (ref 145–400)
RBC: 3.89 10*6/uL (ref 3.70–5.45)
RDW: 14.6 % — AB (ref 11.2–14.5)
WBC: 3.6 10*3/uL — AB (ref 3.9–10.3)
lymph#: 0.7 10*3/uL — ABNORMAL LOW (ref 0.9–3.3)

## 2013-12-13 NOTE — Telephone Encounter (Signed)
Pt was given copy of labs and informed they are better per Dr. Alvy Bimler.  Keep appt for lab next week as scheduled.  Copy of schedule given to pt.. She verbalized understanding.

## 2013-12-13 NOTE — Telephone Encounter (Signed)
Message copied by Cathlean Cower on Tue Dec 13, 2013  9:58 AM ------      Message from: [facility], [place]: Tue Dec 13, 2013  9:57 AM      Regarding: labs are better                   ----- Message -----         From: [facility] Interface         Sent: 12/13/2013   9:55 AM           To: Heath Lark, MD             ------

## 2013-12-16 ENCOUNTER — Telehealth: Payer: Self-pay

## 2013-12-16 MED ORDER — PREGABALIN 200 MG PO CAPS: ORAL_CAPSULE | ORAL | Status: DC

## 2013-12-16 NOTE — Telephone Encounter (Signed)
[name] from Patient Assistance program states that they need a Lyrica RX signed by Dr. Naaman Plummer. They are not able to accept a RX that is stamped. RX printed for Dr. Naaman Plummer to sign. Fax RX to [phone] Attn:[name].

## 2013-12-16 NOTE — Telephone Encounter (Signed)
Signed Lyrica RX faxed to [facility] @ [phone]. Successful fax confirmation received.

## 2013-12-20 ENCOUNTER — Telehealth: Payer: Self-pay | Admitting: Hematology and Oncology

## 2013-12-20 ENCOUNTER — Ambulatory Visit (HOSPITAL_BASED_OUTPATIENT_CLINIC_OR_DEPARTMENT_OTHER): Payer: Commercial Managed Care - HMO

## 2013-12-20 ENCOUNTER — Other Ambulatory Visit: Payer: Self-pay | Admitting: *Deleted

## 2013-12-20 ENCOUNTER — Other Ambulatory Visit (HOSPITAL_BASED_OUTPATIENT_CLINIC_OR_DEPARTMENT_OTHER): Payer: Commercial Managed Care - HMO

## 2013-12-20 DIAGNOSIS — M0500 Felty's syndrome, unspecified site: Secondary | ICD-10-CM

## 2013-12-20 DIAGNOSIS — D708 Other neutropenia: Secondary | ICD-10-CM

## 2013-12-20 DIAGNOSIS — D704 Cyclic neutropenia: Principal | ICD-10-CM

## 2013-12-20 DIAGNOSIS — M359 Systemic involvement of connective tissue, unspecified: Secondary | ICD-10-CM

## 2013-12-20 LAB — CBC WITH DIFFERENTIAL/PLATELET
BASO%: 1 % (ref 0.0–2.0)
Basophils Absolute: 0 10*3/uL (ref 0.0–0.1)
EOS ABS: 0 10*3/uL (ref 0.0–0.5)
EOS%: 1 % (ref 0.0–7.0)
HEMATOCRIT: 37.5 % (ref 34.8–46.6)
HGB: 12.6 g/dL (ref 11.6–15.9)
LYMPH#: 1.3 10*3/uL (ref 0.9–3.3)
LYMPH%: 66.3 % — ABNORMAL HIGH (ref 14.0–49.7)
MCH: 31 pg (ref 25.1–34.0)
MCHC: 33.6 g/dL (ref 31.5–36.0)
MCV: 92.1 fL (ref 79.5–101.0)
MONO#: 0.4 10*3/uL (ref 0.1–0.9)
MONO%: 20.7 % — ABNORMAL HIGH (ref 0.0–14.0)
NEUT%: 11 % — AB (ref 38.4–76.8)
NEUTROS ABS: 0.2 10*3/uL — AB (ref 1.5–6.5)
Platelets: 145 10*3/uL (ref 145–400)
RBC: 4.07 10*6/uL (ref 3.70–5.45)
RDW: 14.4 % (ref 11.2–14.5)
WBC: 1.9 10*3/uL — AB (ref 3.9–10.3)

## 2013-12-20 MED ORDER — TBO-FILGRASTIM 480 MCG/0.8ML ~~LOC~~ SOSY
480.0000 ug | PREFILLED_SYRINGE | Freq: Every day | SUBCUTANEOUS | Status: DC | PRN
  Administered 2013-12-20: 480 ug via SUBCUTANEOUS
  Filled 2013-12-20: qty 0.8

## 2013-12-20 NOTE — Telephone Encounter (Signed)
inj appts for 7/14 and 7/15 already on schedule. no other orders per 7/14 pof.

## 2013-12-21 ENCOUNTER — Encounter: Payer: Self-pay | Admitting: Physical Medicine & Rehabilitation

## 2013-12-21 ENCOUNTER — Ambulatory Visit (HOSPITAL_BASED_OUTPATIENT_CLINIC_OR_DEPARTMENT_OTHER): Payer: Commercial Managed Care - HMO

## 2013-12-21 ENCOUNTER — Encounter: Payer: Medicare HMO | Attending: Physical Medicine & Rehabilitation | Admitting: Physical Medicine & Rehabilitation

## 2013-12-21 VITALS — BP 96/42 | HR 94 | Resp 14 | Ht 62.0 in | Wt 166.0 lb

## 2013-12-21 VITALS — BP 99/60 | HR 96 | Temp 99.4°F

## 2013-12-21 DIAGNOSIS — D704 Cyclic neutropenia: Principal | ICD-10-CM

## 2013-12-21 DIAGNOSIS — M359 Systemic involvement of connective tissue, unspecified: Secondary | ICD-10-CM

## 2013-12-21 DIAGNOSIS — IMO0001 Reserved for inherently not codable concepts without codable children: Secondary | ICD-10-CM

## 2013-12-21 DIAGNOSIS — M12819 Other specific arthropathies, not elsewhere classified, unspecified shoulder: Secondary | ICD-10-CM

## 2013-12-21 DIAGNOSIS — D708 Other neutropenia: Secondary | ICD-10-CM

## 2013-12-21 DIAGNOSIS — M19019 Primary osteoarthritis, unspecified shoulder: Secondary | ICD-10-CM

## 2013-12-21 DIAGNOSIS — M069 Rheumatoid arthritis, unspecified: Secondary | ICD-10-CM | POA: Diagnosis present

## 2013-12-21 MED ORDER — HYDROCODONE-ACETAMINOPHEN 7.5-325 MG PO TABS: 1.0000 | ORAL_TABLET | ORAL | Status: DC | PRN

## 2013-12-21 MED ORDER — TBO-FILGRASTIM 480 MCG/0.8ML ~~LOC~~ SOSY
480.0000 ug | PREFILLED_SYRINGE | Freq: Every day | SUBCUTANEOUS | Status: DC | PRN
  Administered 2013-12-21: 480 ug via SUBCUTANEOUS
  Filled 2013-12-21: qty 0.8

## 2013-12-21 NOTE — Progress Notes (Signed)
Subjective:    Patient ID: [name], female    DOB: 1950/07/18, 63 y.o.   MRN: [id number]  HPI  [name] is here in follow up of her chronic pain. She is having problems with her husband at home who's having increased dementia. She is attempting to place him in an ALF. He is resistant. The stress is getting to her.   She is still receiving care through rheu/onc. She has not had the response yet to chemo that she hoped she would.    Pain Inventory Average Pain 6 Pain Right Now 6 My pain is sharp and aching  In the last 24 hours, has pain interfered with the following? General activity 4 Relation with others 5 Enjoyment of life 5 What TIME of day is your pain at its worst? morning Sleep (in general) Fair  Pain is worse with: walking and some activites Pain improves with: medication and injections Relief from Meds: 5  Mobility walk without assistance how many minutes can you walk? 15 ability to climb steps?  yes do you drive?  yes transfers alone Do you have any goals in this area?  no  Function disabled: date disabled 01/2008 I need assistance with the following:  dressing, meal prep, household duties and shopping Do you have any goals in this area?  no  Neuro/Psych trouble walking depression anxiety  Prior Studies Any changes since last visit?  no  Physicians involved in your care Any changes since last visit?  no   Family History  Problem Relation Age of Onset  . Clotting disorder Mother     Coumadin  . Allergies Mother   . Heart disease Mother   . Stroke Father   . Heart disease Father   . Alzheimer's disease Father    History   Social History  . Marital Status: Married    Spouse Name: N/A    Number of Children: 1  . Years of Education: N/A   Occupational History  . disability    Social History Main Topics  . Smoking status: Former Smoker -- 0.20 packs/day for 40 years    Types: Cigarettes    Quit date: 12/16/2011  .  Smokeless tobacco: Never Used  . Alcohol Use: Yes     Comment: social  . Drug Use: No  . Sexual Activity: None   Other Topics Concern  . None   Social History Narrative  . None   Past Surgical History  Procedure Laterality Date  . Total abdominal hysterectomy    . Tubal ligation    . Appendectomy    . Total knee arthroplasty      left  . Rotator cuff repair    . Foot fracture surgery    . Synovectomy of l thumb    . Fusion of r wrist    . Upper palate surgery for tmj syndrome    . Replacement total knee      left   Past Medical History  Diagnosis Date  . Diverticulosis   . Fibromyalgia   . Anemia   . Rheumatoid arthritis(714.0)   . Anxiety   . Blood transfusion   . GERD (gastroesophageal reflux disease)   . Myalgia   . Rheumatoid arthritis(714.0)   . Depression   . Joint effusion   . Calcifying tendinitis of shoulder   . Myalgia and myositis    BP 96/42  Pulse 94  Resp 14  Ht 5\' 2"  (1.575 m)  Wt 166 lb (  75.297 kg)  BMI 30.35 kg/m2  SpO2 97%  Opioid Risk Score:   Fall Risk Score: Low Fall Risk (0-5 points) (patient educated handout declined)   Review of Systems  Musculoskeletal: Positive for arthralgias, gait problem and myalgias.  Psychiatric/Behavioral: Positive for dysphoric mood. The patient is nervous/anxious.   All other systems reviewed and are negative.      Objective:   Physical Exam  Constitutional: She appears well-developed and well-nourished.  HENT:  Head: Normocephalic and atraumatic.  Eyes: Conjunctivae and EOM are normal. Pupils are equal, round, and reactive to light.  Neck: Normal range of motion. Neck supple.  Cardiovascular: Normal rate and regular rhythm.  Pulmonary/Chest: Effort normal and breath sounds normal.  Abdominal: Soft.  Musculoskeletal:  Right shoulder: limited.  Left shoulder: limited.  Hands with more severe ulnar deviation. 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 MCP's are swollen/large more  so on the right, although her left hand is more tender.Grip is decreased. She has deviation of the toes on the right foot more than left. Shuffles feet with gait.  Neurological: She has normal strength. No cranial nerve deficit or sensory deficit. She displays a negative Romberg sign.  Reflex Scores:  1+ Psychiatric: She has a normal mood and affect. Her behavior is normal. Judgment and thought content normal.      Assessment & Plan:   ASSESSMENT:  1. History of rheumatoid arthritis and fibromyalgia  2. Bilateral rotator cuff syndrome.    PLAN:  1. Await hand surgery pending her receiving her medical records from her prior hand surgeon.  2. Continue with HEP  3. Refilled her hydrocodone 7.5/325 one q.4-6 prn #180  4. Rheum/onc follow up as indicated.  6. Melatonin for sleep as needed.  Follow up with me in about 2 months.

## 2013-12-21 NOTE — Patient Instructions (Signed)
PLEASE CALL ME WITH ANY PROBLEMS OR QUESTIONS (#[phone]).      

## 2013-12-23 ENCOUNTER — Telehealth: Payer: Self-pay | Admitting: *Deleted

## 2013-12-23 NOTE — Telephone Encounter (Signed)
Pt requested r/s appt.  POF entered

## 2013-12-27 ENCOUNTER — Other Ambulatory Visit: Payer: Commercial Managed Care - HMO

## 2013-12-29 ENCOUNTER — Ambulatory Visit (HOSPITAL_BASED_OUTPATIENT_CLINIC_OR_DEPARTMENT_OTHER): Payer: Commercial Managed Care - HMO

## 2013-12-29 ENCOUNTER — Other Ambulatory Visit (HOSPITAL_BASED_OUTPATIENT_CLINIC_OR_DEPARTMENT_OTHER): Payer: Commercial Managed Care - HMO

## 2013-12-29 ENCOUNTER — Other Ambulatory Visit: Payer: Self-pay | Admitting: *Deleted

## 2013-12-29 VITALS — BP 101/66 | HR 85 | Temp 98.3°F

## 2013-12-29 DIAGNOSIS — M0500 Felty's syndrome, unspecified site: Secondary | ICD-10-CM

## 2013-12-29 DIAGNOSIS — M359 Systemic involvement of connective tissue, unspecified: Secondary | ICD-10-CM

## 2013-12-29 DIAGNOSIS — D708 Other neutropenia: Secondary | ICD-10-CM

## 2013-12-29 DIAGNOSIS — D704 Cyclic neutropenia: Principal | ICD-10-CM

## 2013-12-29 LAB — CBC WITH DIFFERENTIAL/PLATELET
BASO%: 1.4 % (ref 0.0–2.0)
BASOS ABS: 0 10*3/uL (ref 0.0–0.1)
EOS%: 1.4 % (ref 0.0–7.0)
Eosinophils Absolute: 0 10*3/uL (ref 0.0–0.5)
HEMATOCRIT: 35.2 % (ref 34.8–46.6)
HGB: 11.9 g/dL (ref 11.6–15.9)
LYMPH#: 1.3 10*3/uL (ref 0.9–3.3)
LYMPH%: 62.3 % — ABNORMAL HIGH (ref 14.0–49.7)
MCH: 31.1 pg (ref 25.1–34.0)
MCHC: 33.8 g/dL (ref 31.5–36.0)
MCV: 91.9 fL (ref 79.5–101.0)
MONO#: 0.3 10*3/uL (ref 0.1–0.9)
MONO%: 12.3 % (ref 0.0–14.0)
NEUT#: 0.5 10*3/uL — CL (ref 1.5–6.5)
NEUT%: 22.6 % — ABNORMAL LOW (ref 38.4–76.8)
PLATELETS: 119 10*3/uL — AB (ref 145–400)
RBC: 3.83 10*6/uL (ref 3.70–5.45)
RDW: 14.2 % (ref 11.2–14.5)
WBC: 2.1 10*3/uL — ABNORMAL LOW (ref 3.9–10.3)

## 2013-12-29 MED ORDER — TBO-FILGRASTIM 480 MCG/0.8ML ~~LOC~~ SOSY
480.0000 ug | PREFILLED_SYRINGE | Freq: Every day | SUBCUTANEOUS | Status: DC | PRN
  Administered 2013-12-29: 480 ug via SUBCUTANEOUS
  Filled 2013-12-29: qty 0.8

## 2013-12-30 ENCOUNTER — Ambulatory Visit (HOSPITAL_BASED_OUTPATIENT_CLINIC_OR_DEPARTMENT_OTHER): Payer: Commercial Managed Care - HMO

## 2013-12-30 VITALS — BP 105/68 | HR 91 | Temp 98.1°F

## 2013-12-30 DIAGNOSIS — M359 Systemic involvement of connective tissue, unspecified: Secondary | ICD-10-CM

## 2013-12-30 DIAGNOSIS — D704 Cyclic neutropenia: Principal | ICD-10-CM

## 2013-12-30 DIAGNOSIS — M0500 Felty's syndrome, unspecified site: Secondary | ICD-10-CM

## 2013-12-30 DIAGNOSIS — D708 Other neutropenia: Secondary | ICD-10-CM

## 2013-12-30 MED ORDER — TBO-FILGRASTIM 480 MCG/0.8ML ~~LOC~~ SOSY
480.0000 ug | PREFILLED_SYRINGE | Freq: Every day | SUBCUTANEOUS | Status: DC | PRN
  Administered 2013-12-30: 480 ug via SUBCUTANEOUS
  Filled 2013-12-30: qty 0.8

## 2014-01-03 ENCOUNTER — Other Ambulatory Visit (HOSPITAL_BASED_OUTPATIENT_CLINIC_OR_DEPARTMENT_OTHER): Payer: Commercial Managed Care - HMO

## 2014-01-03 ENCOUNTER — Ambulatory Visit (HOSPITAL_BASED_OUTPATIENT_CLINIC_OR_DEPARTMENT_OTHER): Payer: Commercial Managed Care - HMO

## 2014-01-03 VITALS — BP 98/56 | HR 80 | Temp 98.0°F

## 2014-01-03 DIAGNOSIS — D708 Other neutropenia: Secondary | ICD-10-CM

## 2014-01-03 DIAGNOSIS — M0500 Felty's syndrome, unspecified site: Secondary | ICD-10-CM

## 2014-01-03 DIAGNOSIS — M359 Systemic involvement of connective tissue, unspecified: Secondary | ICD-10-CM

## 2014-01-03 DIAGNOSIS — D704 Cyclic neutropenia: Principal | ICD-10-CM

## 2014-01-03 LAB — CBC WITH DIFFERENTIAL/PLATELET
BASO%: 1.4 % (ref 0.0–2.0)
Basophils Absolute: 0 10*3/uL (ref 0.0–0.1)
EOS%: 0.5 % (ref 0.0–7.0)
Eosinophils Absolute: 0 10*3/uL (ref 0.0–0.5)
HCT: 36.4 % (ref 34.8–46.6)
HGB: 12.1 g/dL (ref 11.6–15.9)
LYMPH%: 26.9 % (ref 14.0–49.7)
MCH: 31.2 pg (ref 25.1–34.0)
MCHC: 33.2 g/dL (ref 31.5–36.0)
MCV: 93.8 fL (ref 79.5–101.0)
MONO#: 0.4 10*3/uL (ref 0.1–0.9)
MONO%: 20.8 % — ABNORMAL HIGH (ref 0.0–14.0)
NEUT#: 1.1 10*3/uL — ABNORMAL LOW (ref 1.5–6.5)
NEUT%: 50.4 % (ref 38.4–76.8)
PLATELETS: 152 10*3/uL (ref 145–400)
RBC: 3.88 10*6/uL (ref 3.70–5.45)
RDW: 15.5 % — AB (ref 11.2–14.5)
WBC: 2.1 10*3/uL — AB (ref 3.9–10.3)
lymph#: 0.6 10*3/uL — ABNORMAL LOW (ref 0.9–3.3)
nRBC: 0 % (ref 0–0)

## 2014-01-03 MED ORDER — TBO-FILGRASTIM 480 MCG/0.8ML ~~LOC~~ SOSY
480.0000 ug | PREFILLED_SYRINGE | Freq: Every day | SUBCUTANEOUS | Status: DC | PRN
  Administered 2014-01-03: 480 ug via SUBCUTANEOUS
  Filled 2014-01-03: qty 0.8

## 2014-01-05 ENCOUNTER — Telehealth: Payer: Self-pay | Admitting: *Deleted

## 2014-01-05 NOTE — Telephone Encounter (Signed)
Pt left VM states going to [place] all next week and asking if she can r/s her lab/office visit on 8/04 to the following week?

## 2014-01-06 ENCOUNTER — Telehealth: Payer: Self-pay | Admitting: Hematology and Oncology

## 2014-01-06 ENCOUNTER — Telehealth: Payer: Self-pay | Admitting: *Deleted

## 2014-01-06 NOTE — Telephone Encounter (Signed)
s.w pt and advised on Aug appt moved....pt ok adn aware

## 2014-01-06 NOTE — Telephone Encounter (Signed)
Informed pt ok to r/s her appt next week to the following week per Dr. Alvy Bimler.  Order sent to scheduler.  Pt verbalized understanding.

## 2014-01-06 NOTE — Telephone Encounter (Signed)
OK 

## 2014-01-10 ENCOUNTER — Ambulatory Visit: Payer: Commercial Managed Care - HMO | Admitting: Hematology and Oncology

## 2014-01-10 ENCOUNTER — Other Ambulatory Visit: Payer: Commercial Managed Care - HMO

## 2014-01-10 ENCOUNTER — Telehealth: Payer: Self-pay

## 2014-01-10 MED ORDER — PREGABALIN 200 MG PO CAPS: ORAL_CAPSULE | ORAL | Status: AC

## 2014-01-10 NOTE — Telephone Encounter (Signed)
Per Dr. Naaman Plummer called in patient's Lyrica to the [facility] at [facility]. Attempted to contact patient. No answer nor voicemail.

## 2014-01-10 NOTE — Telephone Encounter (Signed)
We can send the partial rx for 8 or the entire rx---whatever she prefers. thanks

## 2014-01-10 NOTE — Telephone Encounter (Signed)
Patient is requesting that we send a RX for  Lyrica to the [facility] at [facility] [phone] Fax [phone]. Patient is on vacation and forgot her medication. She said she only needs 8 pills to last until she returns home.

## 2014-01-17 ENCOUNTER — Telehealth: Payer: Self-pay | Admitting: Hematology and Oncology

## 2014-01-17 ENCOUNTER — Other Ambulatory Visit (HOSPITAL_BASED_OUTPATIENT_CLINIC_OR_DEPARTMENT_OTHER): Payer: Commercial Managed Care - HMO

## 2014-01-17 ENCOUNTER — Encounter: Payer: Self-pay | Admitting: Hematology and Oncology

## 2014-01-17 ENCOUNTER — Ambulatory Visit (HOSPITAL_BASED_OUTPATIENT_CLINIC_OR_DEPARTMENT_OTHER): Payer: Commercial Managed Care - HMO | Admitting: Hematology and Oncology

## 2014-01-17 VITALS — BP 109/69 | HR 90 | Temp 98.2°F | Resp 18 | Ht 62.0 in | Wt 172.0 lb

## 2014-01-17 DIAGNOSIS — D708 Other neutropenia: Secondary | ICD-10-CM

## 2014-01-17 DIAGNOSIS — M0500 Felty's syndrome, unspecified site: Secondary | ICD-10-CM

## 2014-01-17 DIAGNOSIS — M359 Systemic involvement of connective tissue, unspecified: Secondary | ICD-10-CM

## 2014-01-17 DIAGNOSIS — D704 Cyclic neutropenia: Principal | ICD-10-CM

## 2014-01-17 DIAGNOSIS — M069 Rheumatoid arthritis, unspecified: Secondary | ICD-10-CM

## 2014-01-17 LAB — CBC WITH DIFFERENTIAL/PLATELET
BASO%: 1.3 % (ref 0.0–2.0)
BASOS ABS: 0 10*3/uL (ref 0.0–0.1)
EOS%: 0.7 % (ref 0.0–7.0)
Eosinophils Absolute: 0 10*3/uL (ref 0.0–0.5)
HCT: 36.7 % (ref 34.8–46.6)
HGB: 12.2 g/dL (ref 11.6–15.9)
LYMPH#: 0.5 10*3/uL — AB (ref 0.9–3.3)
LYMPH%: 18.5 % (ref 14.0–49.7)
MCH: 30.6 pg (ref 25.1–34.0)
MCHC: 33.3 g/dL (ref 31.5–36.0)
MCV: 92.1 fL (ref 79.5–101.0)
MONO#: 0.3 10*3/uL (ref 0.1–0.9)
MONO%: 9.8 % (ref 0.0–14.0)
NEUT#: 1.9 10*3/uL (ref 1.5–6.5)
NEUT%: 69.7 % (ref 38.4–76.8)
Platelets: 218 10*3/uL (ref 145–400)
RBC: 3.99 10*6/uL (ref 3.70–5.45)
RDW: 14.4 % (ref 11.2–14.5)
WBC: 2.7 10*3/uL — ABNORMAL LOW (ref 3.9–10.3)

## 2014-01-17 NOTE — Assessment & Plan Note (Signed)
This is stable. I recommend reducing prednisone to 7.5 mg daily. Starting 02/07/2014, she will reduce prednisone to 7.5 mg alternate with 5 mg daily. Starting 03/09/2014, she will continue prednisone taper to 5 mg daily. She has no recent flare of arthritis.

## 2014-01-17 NOTE — Assessment & Plan Note (Signed)
She will continue to return here to have her blood checked in 2 weeks and then monthly. If she developed neutropenia with ANC less than 1000, she will receive Granix injection. She appears to be responding to treatment and her white blood cell count is now within normal limits.

## 2014-01-17 NOTE — Progress Notes (Signed)
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 Bone marrow aspirate and biopsy was performed in 2002 and 2004 with no abnormalities found. The patient was treated with G-CSF with good response. She was subsequently started on methotrexate weekly along with prednisone.  On 09/09/2013, methotrexate was discontinued due to severe neutropenia. She was started on G-CSF injection daily. From 10/14/2013 to 11/04/13, she was started on weekly rituximab.  INTERVAL HISTORY: [name] 63 y.o. female returns for further followup. She denies recent infection. She tolerated prednisone taper well without flare of arthritis. The wound on the leg has healed.  I have reviewed the past medical history, past surgical history, social history and family history with the patient and they are unchanged from previous note.  ALLERGIES:  is allergic to penicillins.  MEDICATIONS:  Current Outpatient Prescriptions  Medication Sig Dispense Refill  . Alum & Mag Hydroxide-Simeth (MAGIC MOUTHWASH W/LIDOCAINE) SOLN Take 5 mLs by mouth 3 (three) times daily as needed.  240 mL  0  . Ascorbic Acid (VITAMIN C) 1000 MG tablet Take 1,000 mg by mouth daily.      . calcium carbonate (CALCIUM 600) 600 MG TABS Take 600 mg by mouth 2 (two) times daily with a meal.        . Cholecalciferol (VITAMIN D) 1000 UNITS capsule Take 1,000 Units by mouth daily.       . cyclobenzaprine (FLEXERIL) 10 MG tablet Take 1 tablet (10 mg total) by mouth 3 (three) times daily as needed for muscle spasms.  90 tablet  2  . diclofenac sodium (VOLTAREN) 1 % GEL Apply 2 g topically 4 (four) times daily.  3 Tube  2  . DULoxetine (CYMBALTA) 60 MG capsule TAKE  ONE CAPSULE BY MOUTH TWICE DAILY  180 capsule  3  . folic acid (FOLVITE) 1 MG tablet Take 1 mg by mouth daily.      Marland Kitchen HYDROcodone-acetaminophen (NORCO) 7.5-325 MG per tablet Take 1 tablet by mouth every 4 (four) hours as needed. Max 6 per day  180 tablet  0  . Melatonin 10 MG CAPS Take 5 mg by mouth at bedtime.       . nortriptyline (PAMELOR) 50 MG capsule TAKE TWO CAPSULES BY MOUTH AT BEDTIME  60 capsule  3  . omeprazole (PRILOSEC) 20 MG capsule Take 20 mg by mouth daily.       . predniSONE (DELTASONE) 10 MG tablet TAKE DAILY BY MOUTH AS DIRECTED BY PHYSICIAN.  60 tablet  3  . predniSONE (DELTASONE) 5 MG tablet Take 1.5 tablets (7.5 mg total) by mouth daily with breakfast.  90 tablet  3  . pregabalin (LYRICA) 200 MG capsule TAKE 1 CAPSULE BY MOUTH TWICE DAILY  *90 day supply*  180 capsule  0  . Probiotic Product (PROBIOTIC FORMULA) CAPS Take 1 capsule by mouth daily.        . vitamin B-12 (CYANOCOBALAMIN) 1000 MCG tablet Take 1,000 mcg by mouth every other day.       . zinc gluconate 50 MG tablet Take 100 mg by mouth daily.      . [DISCONTINUED] POTASSIUM PO Take 20 mEq by mouth 2 (two) times daily.       No current facility-administered medications for this visit.  Facility-Administered Medications Ordered in Other Visits  Medication Dose Route Frequency Provider Last Rate Last Dose  . Tbo-Filgrastim (GRANIX) injection 480 mcg  480 mcg Subcutaneous Daily PRN Heath Lark, MD   480 mcg at 12/20/13 6553     REVIEW OF SYSTEMS:   Constitutional: Denies fevers, chills or night sweats Eyes: Denies blurriness of vision Ears, nose, mouth, throat, and face: Denies mucositis or sore throat Respiratory: Denies cough, dyspnea or wheezes Cardiovascular: Denies palpitation, chest discomfort or lower extremity swelling Gastrointestinal:  Denies nausea, heartburn or change in bowel habits Skin: Denies abnormal skin rashes Lymphatics: Denies new lymphadenopathy or easy bruising Neurological:Denies  numbness, tingling or new weaknesses Behavioral/Psych: Mood is stable, no new changes  All other systems were reviewed with the patient and are negative.  PHYSICAL EXAMINATION: ECOG PERFORMANCE STATUS: 0 - Asymptomatic  Filed Vitals:   01/17/14 1150  BP: 109/69  Pulse: 90  Temp: 98.2 F (36.8 C)  Resp: 18   Filed Weights   01/17/14 1150  Weight: 172 lb (78.019 kg)    GENERAL:alert, no distress and comfortable SKIN: skin color, texture, turgor are normal, no rashes or significant lesions EYES: normal, Conjunctiva are pink and non-injected, sclera clear Musculoskeletal:no cyanosis of digits and no clubbing. Chronic degenerative joint changes. NEURO: alert & oriented x 3 with fluent speech, no focal motor/sensory deficits  LABORATORY DATA:  I have reviewed the data as listed Results for orders placed in visit on 01/17/14 (from the past 48 hour(s))  CBC WITH DIFFERENTIAL     Status: Abnormal   Collection Time    01/17/14 11:38 AM      Result Value Ref Range   WBC 2.7 (*) 3.9 - 10.3 10e3/uL   NEUT# 1.9  1.5 - 6.5 10e3/uL   HGB 12.2  11.6 - 15.9 g/dL   HCT 36.7  34.8 - 46.6 %   Platelets 218  145 - 400 10e3/uL   MCV 92.1  79.5 - 101.0 fL   MCH 30.6  25.1 - 34.0 pg   MCHC 33.3  31.5 - 36.0 g/dL   RBC 3.99  3.70 - 5.45 10e6/uL   RDW 14.4  11.2 - 14.5 %   lymph# 0.5 (*) 0.9 - 3.3 10e3/uL   MONO# 0.3  0.1 - 0.9 10e3/uL   Eosinophils Absolute 0.0  0.0 - 0.5 10e3/uL   Basophils Absolute 0.0  0.0 - 0.1 10e3/uL   NEUT% 69.7  38.4 - 76.8 %   LYMPH% 18.5  14.0 - 49.7 %   MONO% 9.8  0.0 - 14.0 %   EOS% 0.7  0.0 - 7.0 %   BASO% 1.3  0.0 - 2.0 %    Lab Results  Component Value Date   WBC 2.7* 01/17/2014   HGB 12.2 01/17/2014   HCT 36.7 01/17/2014   MCV 92.1 01/17/2014   PLT 218 01/17/2014   ASSESSMENT & PLAN:  Neutropenia associated with autoimmune disease She will continue to return here to have her blood checked in 2 weeks and then monthly. If she developed neutropenia with  ANC less than 1000, she will receive Granix injection. She appears to be responding to treatment and her white blood cell count is now within normal limits.    Rheumatoid arthritis This is stable. I recommend reducing prednisone to 7.5 mg daily. Starting 02/07/2014, she will reduce prednisone to 7.5 mg alternate with 5 mg daily. Starting 03/09/2014, she will continue prednisone taper to 5 mg daily. She has no recent flare of  arthritis.      All questions were answered. The patient knows to call the clinic with any problems, questions or concerns. No barriers to learning was detected.  I spent 15 minutes counseling the patient face to face. The total time spent in the appointment was 20 minutes and more than 50% was on counseling.     [place], [place], MD 01/17/2014 12:13 PM

## 2014-01-17 NOTE — Telephone Encounter (Signed)
Pt confirmed labs/ov per 08/11 POF, gave pt AVS...KJ °

## 2014-01-23 ENCOUNTER — Other Ambulatory Visit: Payer: Self-pay

## 2014-01-23 MED ORDER — CYCLOBENZAPRINE HCL 10 MG PO TABS: 10.0000 mg | ORAL_TABLET | Freq: Three times a day (TID) | ORAL | Status: AC | PRN

## 2014-01-31 ENCOUNTER — Other Ambulatory Visit (HOSPITAL_BASED_OUTPATIENT_CLINIC_OR_DEPARTMENT_OTHER): Payer: Commercial Managed Care - HMO

## 2014-01-31 DIAGNOSIS — D708 Other neutropenia: Secondary | ICD-10-CM

## 2014-01-31 DIAGNOSIS — M0500 Felty's syndrome, unspecified site: Secondary | ICD-10-CM

## 2014-01-31 LAB — CBC WITH DIFFERENTIAL/PLATELET
BASO%: 1.1 % (ref 0.0–2.0)
BASOS ABS: 0 10*3/uL (ref 0.0–0.1)
EOS ABS: 0 10*3/uL (ref 0.0–0.5)
EOS%: 0.4 % (ref 0.0–7.0)
HEMATOCRIT: 39.8 % (ref 34.8–46.6)
HEMOGLOBIN: 13.2 g/dL (ref 11.6–15.9)
LYMPH#: 0.6 10*3/uL — AB (ref 0.9–3.3)
LYMPH%: 24.7 % (ref 14.0–49.7)
MCH: 30.5 pg (ref 25.1–34.0)
MCHC: 33.2 g/dL (ref 31.5–36.0)
MCV: 92 fL (ref 79.5–101.0)
MONO#: 0.3 10*3/uL (ref 0.1–0.9)
MONO%: 14.7 % — ABNORMAL HIGH (ref 0.0–14.0)
NEUT%: 59.1 % (ref 38.4–76.8)
NEUTROS ABS: 1.4 10*3/uL — AB (ref 1.5–6.5)
Platelets: 139 10*3/uL — ABNORMAL LOW (ref 145–400)
RBC: 4.32 10*6/uL (ref 3.70–5.45)
RDW: 13.4 % (ref 11.2–14.5)
WBC: 2.3 10*3/uL — ABNORMAL LOW (ref 3.9–10.3)

## 2014-02-21 ENCOUNTER — Encounter: Payer: Medicare HMO | Attending: Physical Medicine & Rehabilitation | Admitting: Physical Medicine & Rehabilitation

## 2014-02-21 ENCOUNTER — Encounter: Payer: Self-pay | Admitting: Physical Medicine & Rehabilitation

## 2014-02-21 VITALS — BP 131/77 | HR 91 | Resp 14 | Wt 169.0 lb

## 2014-02-21 DIAGNOSIS — M069 Rheumatoid arthritis, unspecified: Secondary | ICD-10-CM | POA: Diagnosis not present

## 2014-02-21 DIAGNOSIS — M12819 Other specific arthropathies, not elsewhere classified, unspecified shoulder: Secondary | ICD-10-CM

## 2014-02-21 DIAGNOSIS — M19019 Primary osteoarthritis, unspecified shoulder: Secondary | ICD-10-CM

## 2014-02-21 DIAGNOSIS — IMO0001 Reserved for inherently not codable concepts without codable children: Secondary | ICD-10-CM | POA: Insufficient documentation

## 2014-02-21 DIAGNOSIS — M0500 Felty's syndrome, unspecified site: Secondary | ICD-10-CM

## 2014-02-21 MED ORDER — HYDROCODONE-ACETAMINOPHEN 7.5-325 MG PO TABS: 1.0000 | ORAL_TABLET | ORAL | Status: DC | PRN

## 2014-02-21 NOTE — Progress Notes (Signed)
Subjective:    Patient ID: [name], female    DOB: [birth year], 63 y.o.   MRN: [id number]  HPI  Mrs. Tangen is back regarding her chronic pain. She has had a fairly unremarkable summer. She and her husband have moved into a retirement community. The facility has different levels of care including a memory unit which her husband may ultimately need. She is trying to sell their home as well.   She has received rituxan and remains on chronic steroids as well for her RA.    She has held off on surgery for her right wrist but is now considering surgery for severe medial deviation of her right first toe. She is trying to wean her prednisone down to 5mg  daily in anticipation of the surgery.      Pain Inventory Average Pain 5 Pain Right Now 5 My pain is intermittent, constant, stabbing and aching  In the last 24 hours, has pain interfered with the following? General activity 4 Relation with others 3 Enjoyment of life 4 What TIME of day is your pain at its worst? morning Sleep (in general) Fair  Pain is worse with: walking and some activites Pain improves with: medication and injections Relief from Meds: 6  Mobility walk without assistance how many minutes can you walk? 15 ability to climb steps?  yes do you drive?  yes  Function disabled: date disabled . I need assistance with the following:  meal prep, household duties and shopping  Neuro/Psych numbness tingling depression anxiety  Prior Studies Any changes since last visit?  no  Physicians involved in your care Any changes since last visit?  no   Family History  Problem Relation Age of Onset  . Clotting disorder Mother     Coumadin  . Allergies Mother   . Heart disease Mother   . Stroke Father   . Heart disease Father   . Alzheimer's disease Father    History   Social History  . Marital Status: Married    Spouse Name: N/A    Number of Children: 1  . Years of Education: N/A   Occupational  History  . disability    Social History Main Topics  . Smoking status: Former Smoker -- 0.20 packs/day for 40 years    Types: Cigarettes    Quit date: 12/16/2011  . Smokeless tobacco: Never Used  . Alcohol Use: Yes     Comment: social  . Drug Use: No  . Sexual Activity: None   Other Topics Concern  . None   Social History Narrative  . None   Past Surgical History  Procedure Laterality Date  . Total abdominal hysterectomy    . Tubal ligation    . Appendectomy    . Total knee arthroplasty      left  . Rotator cuff repair    . Foot fracture surgery    . Synovectomy of l thumb    . Fusion of r wrist    . Upper palate surgery for tmj syndrome    . Replacement total knee      left   Past Medical History  Diagnosis Date  . Diverticulosis   . Fibromyalgia   . Anemia   . Rheumatoid arthritis(714.0)   . Anxiety   . Blood transfusion   . GERD (gastroesophageal reflux disease)   . Myalgia   . Rheumatoid arthritis(714.0)   . Depression   . Joint effusion   . Calcifying tendinitis of shoulder   .  Myalgia and myositis    BP 131/77  Pulse 91  Resp 14  Wt 169 lb (76.658 kg)  SpO2 98%  Opioid Risk Score:   Fall Risk Score: Moderate Fall Risk (6-13 points) (previoulsly educated and given handout on 07/20/13) Review of Systems  Neurological: Positive for numbness.       Tingling  Hematological: Bruises/bleeds easily.  Psychiatric/Behavioral: Positive for dysphoric mood. The patient is nervous/anxious.   All other systems reviewed and are negative.      Objective:   Physical Exam  Constitutional: She appears well-developed and well-nourished.  HENT:  Head: Normocephalic and atraumatic.  Eyes: Conjunctivae and EOM are normal. Pupils are equal, round, and reactive to light.  Neck: Normal range of motion. Neck supple.  Cardiovascular: Normal rate and regular rhythm.  Pulmonary/Chest: Effort normal and breath sounds normal.  Abdominal: Soft.  Musculoskeletal:    Right shoulder: limited.  Left shoulder: limited.  Hands with more severe ulnar deviation. 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 MCP's are swollen/large more so on the right, although her left hand is more tender.Grip is decreased. She has deviation of the toes on the right foot more than left. Shuffles feet with gait.  Neurological: She has normal strength. No cranial nerve deficit or sensory deficit. She displays a negative Romberg sign.  Reflex Scores:  1+ Psychiatric: She has a normal mood and affect. Her behavior is normal. Judgment and thought content normal.    Assessment & Plan:   ASSESSMENT:  1. History of rheumatoid arthritis and fibromyalgia  2. Bilateral rotator cuff syndrome.    PLAN:  1. Maintain basic exercise and activity as tolerated.   2. Refilled her hydrocodone 7.5/325 one q.4-6 prn #180  3. Rheum/onc follow up as indicated.  4. Melatonin for sleep as needed.  5. Surgical consideration for right wrist and right great toe.  NP follow up in 2 months. Fifteen minutes of face to face patient care time were spent during this visit. All questions were encouraged and answered.

## 2014-02-21 NOTE — Patient Instructions (Signed)
PLEASE CALL ME WITH ANY PROBLEMS OR QUESTIONS (#[phone]).      

## 2014-02-28 ENCOUNTER — Telehealth: Payer: Self-pay | Admitting: Hematology and Oncology

## 2014-02-28 ENCOUNTER — Telehealth: Payer: Self-pay | Admitting: *Deleted

## 2014-02-28 ENCOUNTER — Other Ambulatory Visit: Payer: Commercial Managed Care - HMO

## 2014-02-28 NOTE — Telephone Encounter (Signed)
Pt left VM states unable to make it for lab appt today.  Her husband is being d/c'd from [facility] in [place] today.  She would like to r/s.   Request sent to scheduler to call pt to r/s lab appt.[name]

## 2014-02-28 NOTE — Telephone Encounter (Signed)
, °

## 2014-03-01 ENCOUNTER — Other Ambulatory Visit: Payer: Commercial Managed Care - HMO

## 2014-03-01 ENCOUNTER — Telehealth: Payer: Self-pay | Admitting: Hematology and Oncology

## 2014-03-01 ENCOUNTER — Telehealth: Payer: Self-pay | Admitting: *Deleted

## 2014-03-01 NOTE — Telephone Encounter (Signed)
Pt cld to r/s labs, pt confirmed per 09/23 POF labs 10/01.......KJ

## 2014-03-01 NOTE — Telephone Encounter (Signed)
Pt left VM she needs to r/s her lab appt today to next week.  Request sent to scheduler.

## 2014-03-09 ENCOUNTER — Other Ambulatory Visit: Payer: Self-pay | Admitting: Hematology and Oncology

## 2014-03-09 ENCOUNTER — Other Ambulatory Visit (HOSPITAL_BASED_OUTPATIENT_CLINIC_OR_DEPARTMENT_OTHER): Payer: Commercial Managed Care - HMO

## 2014-03-09 ENCOUNTER — Ambulatory Visit (HOSPITAL_BASED_OUTPATIENT_CLINIC_OR_DEPARTMENT_OTHER): Payer: Commercial Managed Care - HMO

## 2014-03-09 VITALS — BP 104/64 | HR 86 | Temp 98.0°F

## 2014-03-09 DIAGNOSIS — M359 Systemic involvement of connective tissue, unspecified: Secondary | ICD-10-CM

## 2014-03-09 DIAGNOSIS — D704 Cyclic neutropenia: Principal | ICD-10-CM

## 2014-03-09 DIAGNOSIS — D708 Other neutropenia: Secondary | ICD-10-CM

## 2014-03-09 DIAGNOSIS — M0500 Felty's syndrome, unspecified site: Secondary | ICD-10-CM

## 2014-03-09 LAB — CBC WITH DIFFERENTIAL/PLATELET
BASO%: 1.1 % (ref 0.0–2.0)
Basophils Absolute: 0 10*3/uL (ref 0.0–0.1)
EOS%: 0.8 % (ref 0.0–7.0)
Eosinophils Absolute: 0 10*3/uL (ref 0.0–0.5)
HCT: 36.3 % (ref 34.8–46.6)
HGB: 12.1 g/dL (ref 11.6–15.9)
LYMPH#: 1 10*3/uL (ref 0.9–3.3)
LYMPH%: 52 % — ABNORMAL HIGH (ref 14.0–49.7)
MCH: 30.7 pg (ref 25.1–34.0)
MCHC: 33.4 g/dL (ref 31.5–36.0)
MCV: 92.1 fL (ref 79.5–101.0)
MONO#: 0.4 10*3/uL (ref 0.1–0.9)
MONO%: 20.1 % — ABNORMAL HIGH (ref 0.0–14.0)
NEUT#: 0.5 10*3/uL — CL (ref 1.5–6.5)
NEUT%: 26 % — ABNORMAL LOW (ref 38.4–76.8)
Platelets: 153 10*3/uL (ref 145–400)
RBC: 3.94 10*6/uL (ref 3.70–5.45)
RDW: 15 % — ABNORMAL HIGH (ref 11.2–14.5)
WBC: 1.9 10*3/uL — AB (ref 3.9–10.3)

## 2014-03-09 MED ORDER — TBO-FILGRASTIM 480 MCG/0.8ML ~~LOC~~ SOSY
480.0000 ug | PREFILLED_SYRINGE | Freq: Once | SUBCUTANEOUS | Status: AC
  Administered 2014-03-09: 480 ug via SUBCUTANEOUS
  Filled 2014-03-09: qty 0.8

## 2014-03-28 ENCOUNTER — Ambulatory Visit (HOSPITAL_BASED_OUTPATIENT_CLINIC_OR_DEPARTMENT_OTHER): Payer: Commercial Managed Care - HMO

## 2014-03-28 ENCOUNTER — Other Ambulatory Visit (HOSPITAL_BASED_OUTPATIENT_CLINIC_OR_DEPARTMENT_OTHER): Payer: Commercial Managed Care - HMO

## 2014-03-28 ENCOUNTER — Other Ambulatory Visit: Payer: Self-pay | Admitting: *Deleted

## 2014-03-28 ENCOUNTER — Other Ambulatory Visit: Payer: Self-pay | Admitting: Hematology and Oncology

## 2014-03-28 DIAGNOSIS — D708 Other neutropenia: Secondary | ICD-10-CM

## 2014-03-28 DIAGNOSIS — M359 Systemic involvement of connective tissue, unspecified: Secondary | ICD-10-CM

## 2014-03-28 DIAGNOSIS — D704 Cyclic neutropenia: Principal | ICD-10-CM

## 2014-03-28 DIAGNOSIS — M0500 Felty's syndrome, unspecified site: Secondary | ICD-10-CM

## 2014-03-28 LAB — CBC WITH DIFFERENTIAL/PLATELET
BASO%: 1.1 % (ref 0.0–2.0)
BASOS ABS: 0 10*3/uL (ref 0.0–0.1)
EOS ABS: 0 10*3/uL (ref 0.0–0.5)
EOS%: 1.1 % (ref 0.0–7.0)
HEMATOCRIT: 36.2 % (ref 34.8–46.6)
HEMOGLOBIN: 11.9 g/dL (ref 11.6–15.9)
LYMPH#: 1 10*3/uL (ref 0.9–3.3)
LYMPH%: 55.1 % — ABNORMAL HIGH (ref 14.0–49.7)
MCH: 30.3 pg (ref 25.1–34.0)
MCHC: 32.9 g/dL (ref 31.5–36.0)
MCV: 92.1 fL (ref 79.5–101.0)
MONO#: 0.3 10*3/uL (ref 0.1–0.9)
MONO%: 18.4 % — ABNORMAL HIGH (ref 0.0–14.0)
NEUT%: 24.3 % — ABNORMAL LOW (ref 38.4–76.8)
NEUTROS ABS: 0.5 10*3/uL — AB (ref 1.5–6.5)
Platelets: 142 10*3/uL — ABNORMAL LOW (ref 145–400)
RBC: 3.93 10*6/uL (ref 3.70–5.45)
RDW: 14.8 % — ABNORMAL HIGH (ref 11.2–14.5)
WBC: 1.9 10*3/uL — ABNORMAL LOW (ref 3.9–10.3)

## 2014-03-28 MED ORDER — TBO-FILGRASTIM 480 MCG/0.8ML ~~LOC~~ SOSY
480.0000 ug | PREFILLED_SYRINGE | Freq: Once | SUBCUTANEOUS | Status: AC
  Administered 2014-03-28: 480 ug via SUBCUTANEOUS
  Filled 2014-03-28: qty 0.8

## 2014-03-28 NOTE — Patient Instructions (Addendum)

## 2014-03-29 ENCOUNTER — Other Ambulatory Visit: Payer: Self-pay | Admitting: Physical Medicine & Rehabilitation

## 2014-03-30 ENCOUNTER — Encounter: Payer: Self-pay | Admitting: *Deleted

## 2014-03-30 ENCOUNTER — Ambulatory Visit: Payer: Commercial Managed Care - HMO | Admitting: Hematology and Oncology

## 2014-03-30 ENCOUNTER — Other Ambulatory Visit: Payer: Self-pay | Admitting: Hematology and Oncology

## 2014-03-30 ENCOUNTER — Telehealth: Payer: Self-pay | Admitting: Hematology and Oncology

## 2014-03-30 ENCOUNTER — Ambulatory Visit: Payer: Commercial Managed Care - HMO

## 2014-03-30 ENCOUNTER — Telehealth: Payer: Self-pay | Admitting: *Deleted

## 2014-03-30 ENCOUNTER — Other Ambulatory Visit: Payer: Self-pay | Admitting: Emergency Medicine

## 2014-03-30 NOTE — Telephone Encounter (Signed)
Per staff message from MD I have scheduled appts 

## 2014-03-30 NOTE — Telephone Encounter (Signed)
, °

## 2014-04-04 ENCOUNTER — Other Ambulatory Visit: Payer: Commercial Managed Care - HMO

## 2014-04-05 ENCOUNTER — Other Ambulatory Visit (HOSPITAL_BASED_OUTPATIENT_CLINIC_OR_DEPARTMENT_OTHER): Payer: Commercial Managed Care - HMO

## 2014-04-05 ENCOUNTER — Telehealth: Payer: Self-pay | Admitting: *Deleted

## 2014-04-05 ENCOUNTER — Encounter: Payer: Self-pay | Admitting: Hematology and Oncology

## 2014-04-05 ENCOUNTER — Ambulatory Visit (HOSPITAL_BASED_OUTPATIENT_CLINIC_OR_DEPARTMENT_OTHER): Payer: Commercial Managed Care - HMO | Admitting: Hematology and Oncology

## 2014-04-05 ENCOUNTER — Telehealth: Payer: Self-pay | Admitting: Hematology and Oncology

## 2014-04-05 VITALS — BP 121/71 | HR 91 | Temp 98.3°F | Resp 18 | Ht 62.0 in | Wt 175.5 lb

## 2014-04-05 DIAGNOSIS — M0500 Felty's syndrome, unspecified site: Secondary | ICD-10-CM

## 2014-04-05 DIAGNOSIS — D704 Cyclic neutropenia: Secondary | ICD-10-CM

## 2014-04-05 DIAGNOSIS — D709 Neutropenia, unspecified: Secondary | ICD-10-CM

## 2014-04-05 DIAGNOSIS — M359 Systemic involvement of connective tissue, unspecified: Secondary | ICD-10-CM

## 2014-04-05 DIAGNOSIS — M069 Rheumatoid arthritis, unspecified: Secondary | ICD-10-CM

## 2014-04-05 LAB — CBC WITH DIFFERENTIAL/PLATELET
BASO%: 0.4 % (ref 0.0–2.0)
Basophils Absolute: 0 10*3/uL (ref 0.0–0.1)
EOS ABS: 0 10*3/uL (ref 0.0–0.5)
EOS%: 1.2 % (ref 0.0–7.0)
HEMATOCRIT: 37.1 % (ref 34.8–46.6)
HEMOGLOBIN: 12.8 g/dL (ref 11.6–15.9)
LYMPH%: 25.5 % (ref 14.0–49.7)
MCH: 30.7 pg (ref 25.1–34.0)
MCHC: 34.5 g/dL (ref 31.5–36.0)
MCV: 89 fL (ref 79.5–101.0)
MONO#: 0.4 10*3/uL (ref 0.1–0.9)
MONO%: 14.7 % — ABNORMAL HIGH (ref 0.0–14.0)
NEUT%: 58.2 % (ref 38.4–76.8)
NEUTROS ABS: 1.5 10*3/uL (ref 1.5–6.5)
PLATELETS: 151 10*3/uL (ref 145–400)
RBC: 4.17 10*6/uL (ref 3.70–5.45)
RDW: 14.1 % (ref 11.2–14.5)
WBC: 2.6 10*3/uL — ABNORMAL LOW (ref 3.9–10.3)
lymph#: 0.7 10*3/uL — ABNORMAL LOW (ref 0.9–3.3)

## 2014-04-05 NOTE — Telephone Encounter (Signed)
I have adjusted 11/20 appt

## 2014-04-05 NOTE — Telephone Encounter (Signed)
Per staff message and POF I have scheduled appts. Advised scheduler of appts. JMW  

## 2014-04-05 NOTE — Assessment & Plan Note (Signed)
This is slightly worse when we attempted a prednisone taper. She will continue prednisone 5 mg alternate with 7.5 mg daily.

## 2014-04-05 NOTE — Telephone Encounter (Signed)
Pt confirmed labs/ov per 10/28 POF, gave AVS..... KJ

## 2014-04-05 NOTE — Assessment & Plan Note (Signed)
She will continue to return here to have her blood weekly. If she developed neutropenia with ANC less than 1000, she will receive Granix injection. She appears to be responding to treatment and her white blood cell count is now within normal limits.

## 2014-04-05 NOTE — Progress Notes (Signed)
[place], [place] SUMMARY OF HEMATOLOGIC HISTORY: This patient had been followed here for many years. 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 Bone marrow aspirate and biopsy was performed in 2002 and 2004 with no abnormalities found. The patient was treated with G-CSF with good response. She was subsequently started on methotrexate weekly along with prednisone.  On 09/09/2013, methotrexate was discontinued due to severe neutropenia. She was started on G-CSF injection daily. From 10/14/2013 to 11/04/13, she was started on weekly rituximab. In October 2015, her neutropenia has relapsed INTERVAL HISTORY: [name] 63 y.o. female returns for further follow-up. She has mild flare of rheumatoid arthritis with prednisone taper. She complain of pain which she is managing with pain medicine. She denies recent infection. No recent sore throat, cough, diarrhea or abnormal skin rash.  I have reviewed the past medical history, past surgical history, social history and family history with the patient and they are unchanged from previous note.  ALLERGIES:  is allergic to penicillins.  MEDICATIONS:  Current Outpatient Prescriptions  Medication Sig Dispense Refill  . Alum & Mag Hydroxide-Simeth (MAGIC MOUTHWASH W/LIDOCAINE) SOLN Take 5 mLs by mouth 3 (three) times daily as needed.  240 mL  0  . Ascorbic Acid (VITAMIN C) 1000 MG tablet Take 1,000 mg by mouth daily.      . calcium carbonate (CALCIUM 600) 600 MG TABS Take 600 mg by mouth 2 (two) times daily with a meal.        . Cholecalciferol (VITAMIN D) 1000 UNITS capsule Take 1,000 Units by mouth daily.       . cyclobenzaprine (FLEXERIL) 10 MG tablet Take 1 tablet (10 mg total) by mouth 3 (three) times daily as needed for muscle spasms.  90 tablet  2  . diclofenac sodium (VOLTAREN) 1 % GEL Apply 2 g topically 4 (four) times daily.  3 Tube  2   . DULoxetine (CYMBALTA) 30 MG capsule Take 30 mg by mouth daily.      . folic acid (FOLVITE) 1 MG tablet Take 1 mg by mouth daily.      Marland Kitchen HYDROcodone-acetaminophen (NORCO) 7.5-325 MG per tablet Take 1 tablet by mouth every 4 (four) hours as needed. Max 6 per day  180 tablet  0  . Melatonin 10 MG CAPS Take 5 mg by mouth at bedtime.       . nortriptyline (PAMELOR) 50 MG capsule TAKE 2 CAPSULES BY MOUTH AT BEDTIME  180 capsule  0  . omeprazole (PRILOSEC) 20 MG capsule Take 20 mg by mouth daily.       . predniSONE (DELTASONE) 10 MG tablet TAKE DAILY BY MOUTH AS DIRECTED BY PHYSICIAN.  60 tablet  3  . predniSONE (DELTASONE) 5 MG tablet Take 1.5 tablets (7.5 mg total) by mouth daily with breakfast.  90 tablet  3  . pregabalin (LYRICA) 200 MG capsule TAKE 1 CAPSULE BY MOUTH TWICE DAILY  *90 day supply*  180 capsule  0  . Probiotic Product (PROBIOTIC FORMULA) CAPS Take 1 capsule by mouth daily.        . vitamin B-12 (CYANOCOBALAMIN) 1000 MCG tablet Take 1,000 mcg by mouth every other day.       . zinc gluconate 50 MG tablet Take 100 mg by mouth daily.      . [DISCONTINUED] POTASSIUM PO Take 20 mEq by mouth 2 (two) times daily.       No current  facility-administered medications for this visit.   Facility-Administered Medications Ordered in Other Visits  Medication Dose Route Frequency Provider Last Rate Last Dose  . Tbo-Filgrastim (GRANIX) injection 480 mcg  480 mcg Subcutaneous Daily PRN Heath Lark, MD   480 mcg at 12/20/13 9675     REVIEW OF SYSTEMS:   Constitutional: Denies fevers, chills or night sweats Eyes: Denies blurriness of vision Ears, nose, mouth, throat, and face: Denies mucositis or sore throat Respiratory: Denies cough, dyspnea or wheezes Cardiovascular: Denies palpitation, chest discomfort or lower extremity swelling Gastrointestinal:  Denies nausea, heartburn or change in bowel habits Skin: Denies abnormal skin rashes Lymphatics: Denies new lymphadenopathy or easy  bruising Neurological:Denies numbness, tingling or new weaknesses Behavioral/Psych: Mood is stable, no new changes  All other systems were reviewed with the patient and are negative.  PHYSICAL EXAMINATION: ECOG PERFORMANCE STATUS: 1 - Symptomatic but completely ambulatory  Filed Vitals:   04/05/14 0816  BP: 121/71  Pulse: 91  Temp: 98.3 F (36.8 C)  Resp: 18   Filed Weights   04/05/14 0816  Weight: 175 lb 8 oz (79.606 kg)    GENERAL:alert, no distress and comfortable SKIN: skin color, texture, turgor are normal, no rashes or significant lesions EYES: normal, Conjunctiva are pink and non-injected, sclera clear OROPHARYNX:no exudate, no erythema and lips, buccal mucosa, and tongue normal  NECK: supple, thyroid normal size, non-tender, without nodularity LYMPH:  no palpable lymphadenopathy in the cervical, axillary or inguinal LUNGS: clear to auscultation and percussion with normal breathing effort HEART: regular rate & rhythm and no murmurs and no lower extremity edema ABDOMEN:abdomen soft, non-tender and normal bowel sounds Musculoskeletal:no cyanosis of digits and no clubbing . She has stigmata of degenerative joint changes associated with rheumatoid arthritis NEURO: alert & oriented x 3 with fluent speech, no focal motor/sensory deficits  LABORATORY DATA:  I have reviewed the data as listed Results for orders placed in visit on 04/05/14 (from the past 48 hour(s))  CBC WITH DIFFERENTIAL     Status: Abnormal   Collection Time    04/05/14  8:06 AM      Result Value Ref Range   WBC 2.6 (*) 3.9 - 10.3 10e3/uL   NEUT# 1.5  1.5 - 6.5 10e3/uL   HGB 12.8  11.6 - 15.9 g/dL   HCT 37.1  34.8 - 46.6 %   Platelets 151  145 - 400 10e3/uL   MCV 89.0  79.5 - 101.0 fL   MCH 30.7  25.1 - 34.0 pg   MCHC 34.5  31.5 - 36.0 g/dL   RBC 4.17  3.70 - 5.45 10e6/uL   RDW 14.1  11.2 - 14.5 %   lymph# 0.7 (*) 0.9 - 3.3 10e3/uL   MONO# 0.4  0.1 - 0.9 10e3/uL   Eosinophils Absolute 0.0  0.0 -  0.5 10e3/uL   Basophils Absolute 0.0  0.0 - 0.1 10e3/uL   NEUT% 58.2  38.4 - 76.8 %   LYMPH% 25.5  14.0 - 49.7 %   MONO% 14.7 (*) 0.0 - 14.0 %   EOS% 1.2  0.0 - 7.0 %   BASO% 0.4  0.0 - 2.0 %    Lab Results  Component Value Date   WBC 2.6* 04/05/2014   HGB 12.8 04/05/2014   HCT 37.1 04/05/2014   MCV 89.0 04/05/2014   PLT 151 04/05/2014   ASSESSMENT & PLAN:  Felty's syndrome She had received 4 doses of weekly rituximab completed in May 2015. She had several months of  blood count but now her leukopenia has relapsed with prednisone taper.  We discussed the risks, benefit, side effects of repeat treatment with rituximab and she agreed to proceed. I will restart her treatment this week. In the meantime, I would not recommend tapering her prednisone treatments. Continue to monitor her her blood counts carefully.    Neutropenia associated with autoimmune disease She will continue to return here to have her blood weekly. If she developed neutropenia with ANC less than 1000, she will receive Granix injection. She appears to be responding to treatment and her white blood cell count is now within normal limits.      Rheumatoid arthritis This is slightly worse when we attempted a prednisone taper. She will continue prednisone 5 mg alternate with 7.5 mg daily.         All questions were answered. The patient knows to call the clinic with any problems, questions or concerns. No barriers to learning was detected.  I spent 30 minutes counseling the patient face to face. The total time spent in the appointment was 40 minutes and more than 50% was on counseling.     [place], [place], MD 04/05/2014 9:31 AM

## 2014-04-05 NOTE — Assessment & Plan Note (Signed)
She had received 4 doses of weekly rituximab completed in May 2015. She had several months of blood count but now her leukopenia has relapsed with prednisone taper.  We discussed the risks, benefit, side effects of repeat treatment with rituximab and she agreed to proceed. I will restart her treatment this week. In the meantime, I would not recommend tapering her prednisone treatments. Continue to monitor her her blood counts carefully.

## 2014-04-07 ENCOUNTER — Ambulatory Visit (HOSPITAL_BASED_OUTPATIENT_CLINIC_OR_DEPARTMENT_OTHER): Payer: Commercial Managed Care - HMO

## 2014-04-07 VITALS — BP 93/61 | HR 75 | Temp 98.2°F | Resp 18

## 2014-04-07 DIAGNOSIS — D704 Cyclic neutropenia: Secondary | ICD-10-CM

## 2014-04-07 DIAGNOSIS — M359 Systemic involvement of connective tissue, unspecified: Secondary | ICD-10-CM

## 2014-04-07 DIAGNOSIS — M0500 Felty's syndrome, unspecified site: Secondary | ICD-10-CM

## 2014-04-07 MED ORDER — SODIUM CHLORIDE 0.9 % IV SOLN
375.0000 mg/m2 | Freq: Once | INTRAVENOUS | Status: AC
  Administered 2014-04-07: 700 mg via INTRAVENOUS
  Filled 2014-04-07: qty 70

## 2014-04-07 MED ORDER — DIPHENHYDRAMINE HCL 25 MG PO CAPS
ORAL_CAPSULE | ORAL | Status: AC
  Filled 2014-04-07: qty 2

## 2014-04-07 MED ORDER — ACETAMINOPHEN 325 MG PO TABS
650.0000 mg | ORAL_TABLET | Freq: Once | ORAL | Status: AC
  Administered 2014-04-07: 650 mg via ORAL

## 2014-04-07 MED ORDER — DIPHENHYDRAMINE HCL 25 MG PO CAPS
50.0000 mg | ORAL_CAPSULE | Freq: Once | ORAL | Status: AC
  Administered 2014-04-07: 50 mg via ORAL

## 2014-04-07 MED ORDER — ACETAMINOPHEN 325 MG PO TABS
ORAL_TABLET | ORAL | Status: AC
  Filled 2014-04-07: qty 2

## 2014-04-07 MED ORDER — SODIUM CHLORIDE 0.9 % IV SOLN
Freq: Once | INTRAVENOUS | Status: AC
  Administered 2014-04-07: 10:00:00 via INTRAVENOUS

## 2014-04-07 NOTE — Patient Instructions (Signed)
[facility] Discharge Instructions for Patients Receiving Chemotherapy  Today you received the following chemotherapy agents: Rituxan   To help prevent nausea and vomiting after your treatment, we encourage you to take your nausea medication as prescribed by your physician.    If you develop nausea and vomiting that is not controlled by your nausea medication, call the clinic.   BELOW ARE SYMPTOMS THAT SHOULD BE REPORTED IMMEDIATELY:  *FEVER GREATER THAN 100.5 F  *CHILLS WITH OR WITHOUT FEVER  NAUSEA AND VOMITING THAT IS NOT CONTROLLED WITH YOUR NAUSEA MEDICATION  *UNUSUAL SHORTNESS OF BREATH  *UNUSUAL BRUISING OR BLEEDING  TENDERNESS IN MOUTH AND THROAT WITH OR WITHOUT PRESENCE OF ULCERS  *URINARY PROBLEMS  *BOWEL PROBLEMS  UNUSUAL RASH Items with * indicate a potential emergency and should be followed up as soon as possible.  Feel free to call the clinic you have any questions or concerns. The clinic phone number is [phone].    

## 2014-04-14 ENCOUNTER — Other Ambulatory Visit: Payer: Self-pay | Admitting: Hematology and Oncology

## 2014-04-14 ENCOUNTER — Other Ambulatory Visit (HOSPITAL_BASED_OUTPATIENT_CLINIC_OR_DEPARTMENT_OTHER): Payer: Commercial Managed Care - HMO

## 2014-04-14 ENCOUNTER — Ambulatory Visit (HOSPITAL_BASED_OUTPATIENT_CLINIC_OR_DEPARTMENT_OTHER): Payer: Commercial Managed Care - HMO

## 2014-04-14 DIAGNOSIS — M0500 Felty's syndrome, unspecified site: Secondary | ICD-10-CM

## 2014-04-14 DIAGNOSIS — M359 Systemic involvement of connective tissue, unspecified: Secondary | ICD-10-CM

## 2014-04-14 DIAGNOSIS — Z5112 Encounter for antineoplastic immunotherapy: Secondary | ICD-10-CM

## 2014-04-14 DIAGNOSIS — D708 Other neutropenia: Secondary | ICD-10-CM

## 2014-04-14 DIAGNOSIS — D704 Cyclic neutropenia: Secondary | ICD-10-CM

## 2014-04-14 LAB — CBC WITH DIFFERENTIAL/PLATELET
BASO%: 1.2 % (ref 0.0–2.0)
Basophils Absolute: 0 10*3/uL (ref 0.0–0.1)
EOS%: 1 % (ref 0.0–7.0)
Eosinophils Absolute: 0 10*3/uL (ref 0.0–0.5)
HCT: 34.9 % (ref 34.8–46.6)
HEMOGLOBIN: 11.7 g/dL (ref 11.6–15.9)
LYMPH#: 0.7 10*3/uL — AB (ref 0.9–3.3)
LYMPH%: 37 % (ref 14.0–49.7)
MCH: 30.3 pg (ref 25.1–34.0)
MCHC: 33.5 g/dL (ref 31.5–36.0)
MCV: 90.7 fL (ref 79.5–101.0)
MONO#: 0.4 10*3/uL (ref 0.1–0.9)
MONO%: 21.7 % — ABNORMAL HIGH (ref 0.0–14.0)
NEUT#: 0.7 10*3/uL — ABNORMAL LOW (ref 1.5–6.5)
NEUT%: 39.1 % (ref 38.4–76.8)
Platelets: 168 10*3/uL (ref 145–400)
RBC: 3.84 10*6/uL (ref 3.70–5.45)
RDW: 14.1 % (ref 11.2–14.5)
WBC: 1.9 10*3/uL — ABNORMAL LOW (ref 3.9–10.3)

## 2014-04-14 MED ORDER — DIPHENHYDRAMINE HCL 25 MG PO CAPS
ORAL_CAPSULE | ORAL | Status: AC
  Filled 2014-04-14: qty 2

## 2014-04-14 MED ORDER — TBO-FILGRASTIM 480 MCG/0.8ML ~~LOC~~ SOSY
480.0000 ug | PREFILLED_SYRINGE | Freq: Once | SUBCUTANEOUS | Status: AC
  Administered 2014-04-14: 480 ug via SUBCUTANEOUS
  Filled 2014-04-14: qty 0.8

## 2014-04-14 MED ORDER — DIPHENHYDRAMINE HCL 25 MG PO CAPS
50.0000 mg | ORAL_CAPSULE | Freq: Once | ORAL | Status: AC
  Administered 2014-04-14: 50 mg via ORAL

## 2014-04-14 MED ORDER — RITUXIMAB CHEMO INJECTION 500 MG/50ML
375.0000 mg/m2 | Freq: Once | INTRAVENOUS | Status: AC
  Administered 2014-04-14: 700 mg via INTRAVENOUS
  Filled 2014-04-14: qty 70

## 2014-04-14 MED ORDER — ACETAMINOPHEN 325 MG PO TABS
650.0000 mg | ORAL_TABLET | Freq: Once | ORAL | Status: AC
  Administered 2014-04-14: 650 mg via ORAL

## 2014-04-14 MED ORDER — SODIUM CHLORIDE 0.9 % IV SOLN
Freq: Once | INTRAVENOUS | Status: AC
  Administered 2014-04-14: 09:00:00 via INTRAVENOUS

## 2014-04-14 MED ORDER — ACETAMINOPHEN 325 MG PO TABS
ORAL_TABLET | ORAL | Status: AC
  Filled 2014-04-14: qty 2

## 2014-04-14 NOTE — Patient Instructions (Signed)

## 2014-04-14 NOTE — Progress Notes (Signed)
Per Dr.Gorsuch, proceed with treatment today and give Granix. She is aware pt's CBC.

## 2014-04-21 ENCOUNTER — Ambulatory Visit (HOSPITAL_BASED_OUTPATIENT_CLINIC_OR_DEPARTMENT_OTHER): Payer: Commercial Managed Care - HMO

## 2014-04-21 ENCOUNTER — Other Ambulatory Visit (HOSPITAL_BASED_OUTPATIENT_CLINIC_OR_DEPARTMENT_OTHER): Payer: Commercial Managed Care - HMO

## 2014-04-21 DIAGNOSIS — M0500 Felty's syndrome, unspecified site: Secondary | ICD-10-CM

## 2014-04-21 DIAGNOSIS — M359 Systemic involvement of connective tissue, unspecified: Secondary | ICD-10-CM

## 2014-04-21 DIAGNOSIS — D704 Cyclic neutropenia: Secondary | ICD-10-CM

## 2014-04-21 DIAGNOSIS — Z5112 Encounter for antineoplastic immunotherapy: Secondary | ICD-10-CM

## 2014-04-21 LAB — CBC WITH DIFFERENTIAL/PLATELET
BASO%: 0.7 % (ref 0.0–2.0)
BASOS ABS: 0 10*3/uL (ref 0.0–0.1)
EOS%: 0.3 % (ref 0.0–7.0)
Eosinophils Absolute: 0 10*3/uL (ref 0.0–0.5)
HCT: 40.5 % (ref 34.8–46.6)
HEMOGLOBIN: 13.9 g/dL (ref 11.6–15.9)
LYMPH#: 0.8 10*3/uL — AB (ref 0.9–3.3)
LYMPH%: 27.1 % (ref 14.0–49.7)
MCH: 30.4 pg (ref 25.1–34.0)
MCHC: 34.3 g/dL (ref 31.5–36.0)
MCV: 88.6 fL (ref 79.5–101.0)
MONO#: 0.7 10*3/uL (ref 0.1–0.9)
MONO%: 21.5 % — AB (ref 0.0–14.0)
NEUT#: 1.5 10*3/uL (ref 1.5–6.5)
NEUT%: 50.4 % (ref 38.4–76.8)
NRBC: 0 % (ref 0–0)
Platelets: 166 10*3/uL (ref 145–400)
RBC: 4.57 10*6/uL (ref 3.70–5.45)
RDW: 13.9 % (ref 11.2–14.5)
WBC: 3 10*3/uL — ABNORMAL LOW (ref 3.9–10.3)

## 2014-04-21 MED ORDER — SODIUM CHLORIDE 0.9 % IV SOLN
Freq: Once | INTRAVENOUS | Status: AC
  Administered 2014-04-21: 09:00:00 via INTRAVENOUS

## 2014-04-21 MED ORDER — DIPHENHYDRAMINE HCL 25 MG PO CAPS
50.0000 mg | ORAL_CAPSULE | Freq: Once | ORAL | Status: AC
  Administered 2014-04-21: 50 mg via ORAL

## 2014-04-21 MED ORDER — ACETAMINOPHEN 325 MG PO TABS
ORAL_TABLET | ORAL | Status: AC
  Filled 2014-04-21: qty 2

## 2014-04-21 MED ORDER — ACETAMINOPHEN 325 MG PO TABS
650.0000 mg | ORAL_TABLET | Freq: Once | ORAL | Status: AC
  Administered 2014-04-21: 650 mg via ORAL

## 2014-04-21 MED ORDER — DIPHENHYDRAMINE HCL 25 MG PO CAPS
ORAL_CAPSULE | ORAL | Status: AC
  Filled 2014-04-21: qty 2

## 2014-04-21 MED ORDER — SODIUM CHLORIDE 0.9 % IV SOLN
375.0000 mg/m2 | Freq: Once | INTRAVENOUS | Status: AC
  Administered 2014-04-21: 700 mg via INTRAVENOUS
  Filled 2014-04-21: qty 70

## 2014-04-21 NOTE — Progress Notes (Signed)
Per Dr.Guedena proceed with treatment, he is aware of CBC.

## 2014-04-21 NOTE — Patient Instructions (Signed)
[facility] Discharge Instructions for Patients Receiving Chemotherapy  Today you received the following chemotherapy agents Rituxan.  To help prevent nausea and vomiting after your treatment, we encourage you to take your nausea medication as prescribed.   If you develop nausea and vomiting that is not controlled by your nausea medication, call the clinic.   BELOW ARE SYMPTOMS THAT SHOULD BE REPORTED IMMEDIATELY:  *FEVER GREATER THAN 100.5 F  *CHILLS WITH OR WITHOUT FEVER  NAUSEA AND VOMITING THAT IS NOT CONTROLLED WITH YOUR NAUSEA MEDICATION  *UNUSUAL SHORTNESS OF BREATH  *UNUSUAL BRUISING OR BLEEDING  TENDERNESS IN MOUTH AND THROAT WITH OR WITHOUT PRESENCE OF ULCERS  *URINARY PROBLEMS  *BOWEL PROBLEMS  UNUSUAL RASH Items with * indicate a potential emergency and should be followed up as soon as possible.  Feel free to call the clinic you have any questions or concerns. The clinic phone number is [phone].    

## 2014-04-24 ENCOUNTER — Encounter: Payer: Medicare HMO | Admitting: Registered Nurse

## 2014-04-24 ENCOUNTER — Ambulatory Visit: Payer: Commercial Managed Care - HMO | Admitting: Registered Nurse

## 2014-04-25 ENCOUNTER — Ambulatory Visit: Payer: Commercial Managed Care - HMO | Admitting: Hematology and Oncology

## 2014-04-25 ENCOUNTER — Other Ambulatory Visit: Payer: Commercial Managed Care - HMO

## 2014-04-28 ENCOUNTER — Ambulatory Visit (HOSPITAL_BASED_OUTPATIENT_CLINIC_OR_DEPARTMENT_OTHER): Payer: Commercial Managed Care - HMO

## 2014-04-28 ENCOUNTER — Telehealth: Payer: Self-pay | Admitting: Hematology and Oncology

## 2014-04-28 ENCOUNTER — Encounter: Payer: Self-pay | Admitting: Hematology and Oncology

## 2014-04-28 ENCOUNTER — Ambulatory Visit (HOSPITAL_BASED_OUTPATIENT_CLINIC_OR_DEPARTMENT_OTHER): Payer: Commercial Managed Care - HMO | Admitting: Hematology and Oncology

## 2014-04-28 ENCOUNTER — Other Ambulatory Visit (HOSPITAL_BASED_OUTPATIENT_CLINIC_OR_DEPARTMENT_OTHER): Payer: Commercial Managed Care - HMO

## 2014-04-28 VITALS — BP 115/66 | HR 93 | Temp 98.0°F | Resp 18 | Ht 62.0 in | Wt 172.3 lb

## 2014-04-28 DIAGNOSIS — Z5112 Encounter for antineoplastic immunotherapy: Secondary | ICD-10-CM

## 2014-04-28 DIAGNOSIS — D704 Cyclic neutropenia: Secondary | ICD-10-CM

## 2014-04-28 DIAGNOSIS — D708 Other neutropenia: Secondary | ICD-10-CM

## 2014-04-28 DIAGNOSIS — M0500 Felty's syndrome, unspecified site: Secondary | ICD-10-CM

## 2014-04-28 DIAGNOSIS — M069 Rheumatoid arthritis, unspecified: Secondary | ICD-10-CM

## 2014-04-28 DIAGNOSIS — M359 Systemic involvement of connective tissue, unspecified: Secondary | ICD-10-CM

## 2014-04-28 DIAGNOSIS — D696 Thrombocytopenia, unspecified: Secondary | ICD-10-CM

## 2014-04-28 LAB — CBC WITH DIFFERENTIAL/PLATELET
BASO%: 0.5 % (ref 0.0–2.0)
BASOS ABS: 0 10*3/uL (ref 0.0–0.1)
EOS%: 0.2 % (ref 0.0–7.0)
Eosinophils Absolute: 0 10*3/uL (ref 0.0–0.5)
HEMATOCRIT: 37.1 % (ref 34.8–46.6)
HEMOGLOBIN: 12.8 g/dL (ref 11.6–15.9)
LYMPH#: 0.6 10*3/uL — AB (ref 0.9–3.3)
LYMPH%: 14.6 % (ref 14.0–49.7)
MCH: 30.4 pg (ref 25.1–34.0)
MCHC: 34.5 g/dL (ref 31.5–36.0)
MCV: 88.1 fL (ref 79.5–101.0)
MONO#: 0.4 10*3/uL (ref 0.1–0.9)
MONO%: 9.2 % (ref 0.0–14.0)
NEUT#: 3.1 10*3/uL (ref 1.5–6.5)
NEUT%: 75.5 % (ref 38.4–76.8)
Platelets: 151 10*3/uL (ref 145–400)
RBC: 4.21 10*6/uL (ref 3.70–5.45)
RDW: 13.8 % (ref 11.2–14.5)
WBC: 4.1 10*3/uL (ref 3.9–10.3)

## 2014-04-28 LAB — COMPREHENSIVE METABOLIC PANEL (CC13)
ALT: 16 U/L (ref 0–55)
AST: 16 U/L (ref 5–34)
Albumin: 3.8 g/dL (ref 3.5–5.0)
Alkaline Phosphatase: 60 U/L (ref 40–150)
Anion Gap: 11 mEq/L (ref 3–11)
BILIRUBIN TOTAL: 0.42 mg/dL (ref 0.20–1.20)
BUN: 9.1 mg/dL (ref 7.0–26.0)
CO2: 24 meq/L (ref 22–29)
CREATININE: 0.7 mg/dL (ref 0.6–1.1)
Calcium: 9.4 mg/dL (ref 8.4–10.4)
Chloride: 107 mEq/L (ref 98–109)
Glucose: 89 mg/dl (ref 70–140)
Potassium: 3.8 mEq/L (ref 3.5–5.1)
Sodium: 141 mEq/L (ref 136–145)
Total Protein: 7.4 g/dL (ref 6.4–8.3)

## 2014-04-28 LAB — LACTATE DEHYDROGENASE (CC13): LDH: 148 U/L (ref 125–245)

## 2014-04-28 MED ORDER — ACETAMINOPHEN 325 MG PO TABS
650.0000 mg | ORAL_TABLET | Freq: Once | ORAL | Status: AC
  Administered 2014-04-28: 650 mg via ORAL

## 2014-04-28 MED ORDER — SODIUM CHLORIDE 0.9 % IV SOLN
Freq: Once | INTRAVENOUS | Status: AC
Start: 2014-04-28 — End: 2014-04-28
  Administered 2014-04-28: 11:00:00 via INTRAVENOUS

## 2014-04-28 MED ORDER — DIPHENHYDRAMINE HCL 25 MG PO CAPS
50.0000 mg | ORAL_CAPSULE | Freq: Once | ORAL | Status: AC
  Administered 2014-04-28: 50 mg via ORAL

## 2014-04-28 MED ORDER — DIPHENHYDRAMINE HCL 25 MG PO CAPS
ORAL_CAPSULE | ORAL | Status: AC
  Filled 2014-04-28: qty 2

## 2014-04-28 MED ORDER — SODIUM CHLORIDE 0.9 % IV SOLN
375.0000 mg/m2 | Freq: Once | INTRAVENOUS | Status: AC
  Administered 2014-04-28: 700 mg via INTRAVENOUS
  Filled 2014-04-28: qty 70

## 2014-04-28 MED ORDER — ACETAMINOPHEN 325 MG PO TABS
ORAL_TABLET | ORAL | Status: AC
  Filled 2014-04-28: qty 2

## 2014-04-28 NOTE — Assessment & Plan Note (Signed)
She had received 4 doses of weekly rituximab completed in May 2015. She had several months of blood count but now her leukopenia has relapsed with prednisone taper.  We discussed the risks, benefit, side effects of repeat treatment with rituximab and she agreed to proceed. Treatment was restarted again on 04/07/2014 with good response. In the meantime, I would not recommend tapering her prednisone treatments. Continue to monitor her her blood counts carefully. Right now, I recommend the patient to stay on prednisone at 7.5 mg alternate with 10 mg.

## 2014-04-28 NOTE — Patient Instructions (Signed)

## 2014-04-28 NOTE — Telephone Encounter (Signed)
Gave avs & cal for Dec. °

## 2014-04-28 NOTE — Assessment & Plan Note (Signed)
She appears to be responding to treatment and her white blood cell count is now within normal limits.

## 2014-04-28 NOTE — Assessment & Plan Note (Signed)
This has resolved. Close observation only.

## 2014-04-28 NOTE — Progress Notes (Signed)
[place] OFFICE PROGRESS NOTE  Patient Care Team: Thressa Sheller, MD as PCP - General (Internal Medicine) Hennie Duos, MD as Consulting Physician (Rheumatology) Annia Belt, MD as Consulting Physician (Oncology)  SUMMARY OF ONCOLOGIC HISTORY:  This patient had been followed here for many years. 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 Bone marrow aspirate and biopsy was performed in 2002 and 2004 with no abnormalities found. The patient was treated with G-CSF with good response. She was subsequently started on methotrexate weekly along with prednisone.  On 09/09/2013, methotrexate was discontinued due to severe neutropenia. She was started on G-CSF injection daily. From 10/14/2013 to 11/04/13, she was started on weekly rituximab. In October 2015, her neutropenia has relapsed. On 04/07/2014, she received rituximab again.  INTERVAL HISTORY: Please see below for problem oriented charting. She complained of severe joint pain when the dose of prednisone was tapered. Otherwise she had no other complaints.  REVIEW OF SYSTEMS:   Constitutional: Denies fevers, chills or abnormal weight loss Eyes: Denies blurriness of vision Ears, nose, mouth, throat, and face: Denies mucositis or sore throat Respiratory: Denies cough, dyspnea or wheezes Cardiovascular: Denies palpitation, chest discomfort or lower extremity swelling Gastrointestinal:  Denies nausea, heartburn or change in bowel habits Skin: Denies abnormal skin rashes Lymphatics: Denies new lymphadenopathy or easy bruising Neurological:Denies numbness, tingling or new weaknesses Behavioral/Psych: Mood is stable, no new changes  All other systems were reviewed with the patient and are negative.  I have reviewed the past medical history, past surgical history, social history and family history with the patient and they are unchanged from previous  note.  ALLERGIES:  is allergic to penicillins.  MEDICATIONS:  Current Outpatient Prescriptions  Medication Sig Dispense Refill  . Alum & Mag Hydroxide-Simeth (MAGIC MOUTHWASH W/LIDOCAINE) SOLN Take 5 mLs by mouth 3 (three) times daily as needed. 240 mL 0  . Ascorbic Acid (VITAMIN C) 1000 MG tablet Take 1,000 mg by mouth daily.    . calcium carbonate (CALCIUM 600) 600 MG TABS Take 600 mg by mouth 2 (two) times daily with a meal.      . Cholecalciferol (VITAMIN D) 1000 UNITS capsule Take 1,000 Units by mouth daily.     . cyclobenzaprine (FLEXERIL) 10 MG tablet Take 1 tablet (10 mg total) by mouth 3 (three) times daily as needed for muscle spasms. 90 tablet 2  . diclofenac sodium (VOLTAREN) 1 % GEL Apply 2 g topically 4 (four) times daily. 3 Tube 2  . DULoxetine (CYMBALTA) 30 MG capsule Take 30 mg by mouth daily.    . folic acid (FOLVITE) 1 MG tablet Take 1 mg by mouth daily.    Marland Kitchen HYDROcodone-acetaminophen (NORCO) 7.5-325 MG per tablet Take 1 tablet by mouth every 4 (four) hours as needed. Max 6 per day 180 tablet 0  . Melatonin 10 MG CAPS Take 5 mg by mouth at bedtime.     . nortriptyline (PAMELOR) 50 MG capsule TAKE 2 CAPSULES BY MOUTH AT BEDTIME 180 capsule 0  . omeprazole (PRILOSEC) 20 MG capsule Take 20 mg by mouth daily.     . predniSONE (DELTASONE) 10 MG tablet TAKE DAILY BY MOUTH AS DIRECTED BY PHYSICIAN. 60 tablet 3  . predniSONE (DELTASONE) 5 MG tablet Take 1.5 tablets (7.5 mg total) by mouth daily with breakfast. 90 tablet 3  . pregabalin (LYRICA) 200 MG capsule TAKE 1 CAPSULE BY MOUTH TWICE DAILY  *90 day supply* 180 capsule 0  .  Probiotic Product (PROBIOTIC FORMULA) CAPS Take 1 capsule by mouth daily.      . vitamin B-12 (CYANOCOBALAMIN) 1000 MCG tablet Take 1,000 mcg by mouth every other day.     . zinc gluconate 50 MG tablet Take 100 mg by mouth daily.    . [DISCONTINUED] POTASSIUM PO Take 20 mEq by mouth 2 (two) times daily.     No current facility-administered medications  for this visit.   Facility-Administered Medications Ordered in Other Visits  Medication Dose Route Frequency Provider Last Rate Last Dose  . Tbo-Filgrastim (GRANIX) injection 480 mcg  480 mcg Subcutaneous Daily PRN Heath Lark, MD   480 mcg at 12/20/13 0942    PHYSICAL EXAMINATION: ECOG PERFORMANCE STATUS: 1 - Symptomatic but completely ambulatory  Filed Vitals:   04/28/14 0937  BP: 115/66  Pulse: 93  Temp: 98 F (36.7 C)  Resp: 18   Filed Weights   04/28/14 0937  Weight: 172 lb 4.8 oz (78.155 kg)    GENERAL:alert, no distress and comfortable SKIN: skin color, texture, turgor are normal, no rashes or significant lesions Musculoskeletal:no cyanosis of digits and no clubbing . Noted stigmata of rheumatoid arthritis joint changes. NEURO: alert & oriented x 3 with fluent speech, no focal motor/sensory deficits  LABORATORY DATA:  I have reviewed the data as listed    Component Value Date/Time   NA 141 04/28/2014 0915   NA 140 10/16/2011 1338   K 3.8 04/28/2014 0915   K 3.8 10/16/2011 1338   CL 99 10/19/2012 1051   CL 105 10/16/2011 1338   CO2 24 04/28/2014 0915   CO2 26 10/16/2011 1338   GLUCOSE 89 04/28/2014 0915   GLUCOSE 143* 10/19/2012 1051   GLUCOSE 140* 10/16/2011 1338   BUN 9.1 04/28/2014 0915   BUN 10 10/16/2011 1338   CREATININE 0.7 04/28/2014 0915   CREATININE 0.82 10/16/2011 1338   CALCIUM 9.4 04/28/2014 0915   CALCIUM 9.3 10/16/2011 1338   PROT 7.4 04/28/2014 0915   PROT 7.1 10/16/2011 1338   ALBUMIN 3.8 04/28/2014 0915   ALBUMIN 4.0 10/16/2011 1338   AST 16 04/28/2014 0915   AST 17 10/16/2011 1338   ALT 16 04/28/2014 0915   ALT 19 10/16/2011 1338   ALKPHOS 60 04/28/2014 0915   ALKPHOS 44 10/16/2011 1338   BILITOT 0.42 04/28/2014 0915   BILITOT 0.5 10/16/2011 1338   GFRNONAA >60 03/01/2011 1112   GFRAA >60 03/01/2011 1112    No results found for: SPEP, UPEP  Lab Results  Component Value Date   WBC 4.1 04/28/2014   NEUTROABS 3.1 04/28/2014    HGB 12.8 04/28/2014   HCT 37.1 04/28/2014   MCV 88.1 04/28/2014   PLT 151 04/28/2014      Chemistry      Component Value Date/Time   NA 141 04/28/2014 0915   NA 140 10/16/2011 1338   K 3.8 04/28/2014 0915   K 3.8 10/16/2011 1338   CL 99 10/19/2012 1051   CL 105 10/16/2011 1338   CO2 24 04/28/2014 0915   CO2 26 10/16/2011 1338   BUN 9.1 04/28/2014 0915   BUN 10 10/16/2011 1338   CREATININE 0.7 04/28/2014 0915   CREATININE 0.82 10/16/2011 1338      Component Value Date/Time   CALCIUM 9.4 04/28/2014 0915   CALCIUM 9.3 10/16/2011 1338   ALKPHOS 60 04/28/2014 0915   ALKPHOS 44 10/16/2011 1338   AST 16 04/28/2014 0915   AST 17 10/16/2011 1338   ALT 16  04/28/2014 0915   ALT 19 10/16/2011 1338   BILITOT 0.42 04/28/2014 0915   BILITOT 0.5 10/16/2011 1338      ASSESSMENT & PLAN:  Felty's syndrome She had received 4 doses of weekly rituximab completed in May 2015. She had several months of blood count but now her leukopenia has relapsed with prednisone taper.  We discussed the risks, benefit, side effects of repeat treatment with rituximab and she agreed to proceed. Treatment was restarted again on 04/07/2014 with good response. In the meantime, I would not recommend tapering her prednisone treatments. Continue to monitor her her blood counts carefully. Right now, I recommend the patient to stay on prednisone at 7.5 mg alternate with 10 mg.     Neutropenia associated with autoimmune disease She appears to be responding to treatment and her white blood cell count is now within normal limits.        Rheumatoid arthritis This is causing severe pain. I recommend increasing prednisone to 7.5 mg alternate with 10 mg.  Thrombocytopenia This has resolved. Close observation only.   No orders of the defined types were placed in this encounter.   All questions were answered. The patient knows to call the clinic with any problems, questions or concerns. No barriers to  learning was detected. I spent 25 minutes counseling the patient face to face. The total time spent in the appointment was 30 minutes and more than 50% was on counseling and review of test results     [facility], [place], MD 04/28/2014 10:12 AM

## 2014-04-28 NOTE — Assessment & Plan Note (Signed)
This is causing severe pain. I recommend increasing prednisone to 7.5 mg alternate with 10 mg. 

## 2014-05-05 ENCOUNTER — Other Ambulatory Visit: Payer: Commercial Managed Care - HMO

## 2014-05-16 ENCOUNTER — Other Ambulatory Visit: Payer: Self-pay | Admitting: Hematology and Oncology

## 2014-05-26 ENCOUNTER — Ambulatory Visit (HOSPITAL_BASED_OUTPATIENT_CLINIC_OR_DEPARTMENT_OTHER): Payer: Commercial Managed Care - HMO | Admitting: Hematology and Oncology

## 2014-05-26 ENCOUNTER — Telehealth: Payer: Self-pay | Admitting: Hematology and Oncology

## 2014-05-26 ENCOUNTER — Telehealth: Payer: Self-pay | Admitting: *Deleted

## 2014-05-26 ENCOUNTER — Other Ambulatory Visit (HOSPITAL_BASED_OUTPATIENT_CLINIC_OR_DEPARTMENT_OTHER): Payer: Commercial Managed Care - HMO

## 2014-05-26 VITALS — BP 121/67 | HR 92 | Temp 98.5°F | Resp 18 | Ht 62.0 in | Wt 175.8 lb

## 2014-05-26 DIAGNOSIS — D704 Cyclic neutropenia: Principal | ICD-10-CM

## 2014-05-26 DIAGNOSIS — D708 Other neutropenia: Secondary | ICD-10-CM

## 2014-05-26 DIAGNOSIS — M359 Systemic involvement of connective tissue, unspecified: Secondary | ICD-10-CM

## 2014-05-26 DIAGNOSIS — M0500 Felty's syndrome, unspecified site: Secondary | ICD-10-CM

## 2014-05-26 DIAGNOSIS — M069 Rheumatoid arthritis, unspecified: Secondary | ICD-10-CM

## 2014-05-26 LAB — CBC WITH DIFFERENTIAL/PLATELET
BASO%: 0.9 % (ref 0.0–2.0)
BASOS ABS: 0 10*3/uL (ref 0.0–0.1)
EOS ABS: 0 10*3/uL (ref 0.0–0.5)
EOS%: 0.7 % (ref 0.0–7.0)
HCT: 43.9 % (ref 34.8–46.6)
HEMOGLOBIN: 14.3 g/dL (ref 11.6–15.9)
LYMPH#: 0.9 10*3/uL (ref 0.9–3.3)
LYMPH%: 42.1 % (ref 14.0–49.7)
MCH: 28.8 pg (ref 25.1–34.0)
MCHC: 32.6 g/dL (ref 31.5–36.0)
MCV: 88.5 fL (ref 79.5–101.0)
MONO#: 0.4 10*3/uL (ref 0.1–0.9)
MONO%: 18.7 % — ABNORMAL HIGH (ref 0.0–14.0)
NEUT%: 37.6 % — ABNORMAL LOW (ref 38.4–76.8)
NEUTROS ABS: 0.8 10*3/uL — AB (ref 1.5–6.5)
Platelets: 170 10*3/uL (ref 145–400)
RBC: 4.96 10*6/uL (ref 3.70–5.45)
RDW: 13.7 % (ref 11.2–14.5)
WBC: 2.2 10*3/uL — AB (ref 3.9–10.3)

## 2014-05-26 MED ORDER — METHOTREXATE SODIUM 10 MG PO TABS: 10.0000 mg | ORAL_TABLET | ORAL | Status: DC

## 2014-05-26 MED ORDER — TBO-FILGRASTIM 480 MCG/0.8ML ~~LOC~~ SOSY
480.0000 ug | PREFILLED_SYRINGE | Freq: Once | SUBCUTANEOUS | Status: AC
  Administered 2014-05-26: 480 ug via SUBCUTANEOUS
  Filled 2014-05-26: qty 0.8

## 2014-05-26 NOTE — Assessment & Plan Note (Signed)
She had received 4 doses of weekly rituximab completed in May 2015 and November 2015. She had several months of blood count but now her leukopenia has relapsed with prednisone taper.  We discussed the risks, benefit, side effects of repeat treatment with rituximab and she agreed to proceed. Right now, I recommend the patient to stay on prednisone at 7.5 mg alternate with 10 mg. Once her counts improved, I plan to restart methotrexate. The risks and benefits of MTX was discussed and she agreed to proceed. She will also get weekly GCSF to keep [place] >1000 to prevent infection

## 2014-05-26 NOTE — Patient Instructions (Signed)

## 2014-05-26 NOTE — Telephone Encounter (Signed)
, °

## 2014-05-26 NOTE — Assessment & Plan Note (Signed)
This is causing severe pain. I recommend increasing prednisone to 7.5 mg alternate with 10 mg.

## 2014-05-26 NOTE — Progress Notes (Signed)
[place], MD SUMMARY OF HEMATOLOGIC HISTORY:  This patient had been followed here for many years. 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 Bone marrow aspirate and biopsy was performed in 2002 and 2004 with no abnormalities found. The patient was treated with G-CSF with good response. She was subsequently started on methotrexate weekly along with prednisone.  On 09/09/2013, methotrexate was discontinued due to severe neutropenia. She was started on G-CSF injection daily. From 10/14/2013 to 11/04/13, she was started on weekly rituximab. In October 2015, her neutropenia has relapsed. On 04/07/2014, she received rituximab again. INTERVAL HISTORY: [name] 63 y.o. female returns for further follow-up. Her arthritis pain is stable. She denies recent infection  I have reviewed the past medical history, past surgical history, social history and family history with the patient and they are unchanged from previous note.  ALLERGIES:  is allergic to penicillins.  MEDICATIONS:  Current Outpatient Prescriptions  Medication Sig Dispense Refill  . Alum & Mag Hydroxide-Simeth (MAGIC MOUTHWASH W/LIDOCAINE) SOLN Take 5 mLs by mouth 3 (three) times daily as needed. 240 mL 0  . Ascorbic Acid (VITAMIN C) 1000 MG tablet Take 1,000 mg by mouth daily.    . calcium carbonate (CALCIUM 600) 600 MG TABS Take 600 mg by mouth 2 (two) times daily with a meal.      . Cholecalciferol (VITAMIN D) 1000 UNITS capsule Take 1,000 Units by mouth daily.     . cyclobenzaprine (FLEXERIL) 10 MG tablet Take 1 tablet (10 mg total) by mouth 3 (three) times daily as needed for muscle spasms. 90 tablet 2  . diclofenac sodium (VOLTAREN) 1 % GEL Apply 2 g topically 4 (four) times daily. 3 Tube 2  . DULoxetine (CYMBALTA) 30 MG capsule Take 30 mg by mouth daily.    . folic acid (FOLVITE) 1 MG tablet Take 1 mg by mouth  daily.    Marland Kitchen HYDROcodone-acetaminophen (NORCO) 7.5-325 MG per tablet Take 1 tablet by mouth every 4 (four) hours as needed. Max 6 per day 180 tablet 0  . Melatonin 10 MG CAPS Take 5 mg by mouth at bedtime.     . nortriptyline (PAMELOR) 50 MG capsule TAKE 2 CAPSULES BY MOUTH AT BEDTIME 180 capsule 0  . omeprazole (PRILOSEC) 20 MG capsule Take 20 mg by mouth daily.     . predniSONE (DELTASONE) 10 MG tablet TAKE DAILY BY MOUTH AS DIRECTED BY PHYSICIAN. 60 tablet 3  . predniSONE (DELTASONE) 5 MG tablet Take 1.5 tablets (7.5 mg total) by mouth daily with breakfast. 90 tablet 3  . pregabalin (LYRICA) 200 MG capsule TAKE 1 CAPSULE BY MOUTH TWICE DAILY  *90 day supply* 180 capsule 0  . Probiotic Product (PROBIOTIC FORMULA) CAPS Take 1 capsule by mouth daily.      . vitamin B-12 (CYANOCOBALAMIN) 1000 MCG tablet Take 1,000 mcg by mouth every other day.     . zinc gluconate 50 MG tablet Take 100 mg by mouth daily.    . methotrexate (RHEUMATREX) 10 MG tablet Take 1 tablet (10 mg total) by mouth once a week. Caution: Chemotherapy. Protect from light. 4 tablet 3  . [DISCONTINUED] POTASSIUM PO Take 20 mEq by mouth 2 (two) times daily.     No current facility-administered medications for this visit.     REVIEW OF SYSTEMS:   Constitutional: Denies fevers, chills or night sweats Eyes: Denies blurriness of vision Ears, nose, mouth, throat, and  face: Denies mucositis or sore throat Respiratory: Denies cough, dyspnea or wheezes Cardiovascular: Denies palpitation, chest discomfort or lower extremity swelling Gastrointestinal:  Denies nausea, heartburn or change in bowel habits Skin: Denies abnormal skin rashes Lymphatics: Denies new lymphadenopathy or easy bruising Neurological:Denies numbness, tingling or new weaknesses Behavioral/Psych: Mood is stable, no new changes  All other systems were reviewed with the patient and are negative.  PHYSICAL EXAMINATION: ECOG PERFORMANCE STATUS: 0 -  Asymptomatic  Filed Vitals:   05/26/14 1048  BP: 121/67  Pulse: 92  Temp: 98.5 F (36.9 C)  Resp: 18   Filed Weights   05/26/14 1048  Weight: 175 lb 12.8 oz (79.742 kg)    GENERAL:alert, no distress and comfortable SKIN: skin color, texture, turgor are normal, no rashes or significant lesions. Thin skin is noted EYES: normal, Conjunctiva are pink and non-injected, sclera clear Musculoskeletal:no cyanosis of digits and no clubbing. Noted stigmata of RA NEURO: alert & oriented x 3 with fluent speech, no focal motor/sensory deficits  LABORATORY DATA:  I have reviewed the data as listed Results for orders placed or performed in visit on 05/26/14 (from the past 48 hour(s))  CBC with Differential     Status: Abnormal   Collection Time: 05/26/14 10:07 AM  Result Value Ref Range   WBC 2.2 (L) 3.9 - 10.3 10e3/uL   NEUT# 0.8 (L) 1.5 - 6.5 10e3/uL   HGB 14.3 11.6 - 15.9 g/dL   HCT 43.9 34.8 - 46.6 %   Platelets 170 145 - 400 10e3/uL   MCV 88.5 79.5 - 101.0 fL   MCH 28.8 25.1 - 34.0 pg   MCHC 32.6 31.5 - 36.0 g/dL   RBC 4.96 3.70 - 5.45 10e6/uL   RDW 13.7 11.2 - 14.5 %   lymph# 0.9 0.9 - 3.3 10e3/uL   MONO# 0.4 0.1 - 0.9 10e3/uL   Eosinophils Absolute 0.0 0.0 - 0.5 10e3/uL   Basophils Absolute 0.0 0.0 - 0.1 10e3/uL   NEUT% 37.6 (L) 38.4 - 76.8 %   LYMPH% 42.1 14.0 - 49.7 %   MONO% 18.7 (H) 0.0 - 14.0 %   EOS% 0.7 0.0 - 7.0 %   BASO% 0.9 0.0 - 2.0 %    Lab Results  Component Value Date   WBC 2.2* 05/26/2014   HGB 14.3 05/26/2014   HCT 43.9 05/26/2014   MCV 88.5 05/26/2014   PLT 170 05/26/2014   ASSESSMENT & PLAN:  Neutropenia associated with autoimmune disease She had received 4 doses of weekly rituximab completed in May 2015 and November 2015. She had several months of blood count but now her leukopenia has relapsed with prednisone taper.  We discussed the risks, benefit, side effects of repeat treatment with rituximab and she agreed to proceed. Right now, I recommend  the patient to stay on prednisone at 7.5 mg alternate with 10 mg. Once her counts improved, I plan to restart methotrexate. The risks and benefits of MTX was discussed and she agreed to proceed. She will also get weekly GCSF to keep [place] >1000 to prevent infection  Rheumatoid arthritis This is causing severe pain. I recommend increasing prednisone to 7.5 mg alternate with 10 mg.   All questions were answered. The patient knows to call the clinic with any problems, questions or concerns. No barriers to learning was detected.  I spent 30 minutes counseling the patient face to face. The total time spent in the appointment was 40 minutes and more than 50% was on counseling.  [place], [place], MD 05/26/2014 2:05 PM

## 2014-05-26 NOTE — Telephone Encounter (Signed)
Per staff message and POF I have scheduled appts. Advised scheduler of appts. JMW  

## 2014-05-31 ENCOUNTER — Ambulatory Visit (HOSPITAL_BASED_OUTPATIENT_CLINIC_OR_DEPARTMENT_OTHER): Payer: Commercial Managed Care - HMO | Admitting: Lab

## 2014-05-31 ENCOUNTER — Ambulatory Visit (HOSPITAL_BASED_OUTPATIENT_CLINIC_OR_DEPARTMENT_OTHER): Payer: Commercial Managed Care - HMO

## 2014-05-31 DIAGNOSIS — D704 Cyclic neutropenia: Secondary | ICD-10-CM

## 2014-05-31 DIAGNOSIS — D708 Other neutropenia: Secondary | ICD-10-CM

## 2014-05-31 DIAGNOSIS — M0500 Felty's syndrome, unspecified site: Secondary | ICD-10-CM

## 2014-05-31 DIAGNOSIS — M359 Systemic involvement of connective tissue, unspecified: Secondary | ICD-10-CM

## 2014-05-31 DIAGNOSIS — M069 Rheumatoid arthritis, unspecified: Secondary | ICD-10-CM

## 2014-05-31 LAB — CBC WITH DIFFERENTIAL/PLATELET
BASO%: 0.5 % (ref 0.0–2.0)
Basophils Absolute: 0 10*3/uL (ref 0.0–0.1)
EOS%: 0.5 % (ref 0.0–7.0)
Eosinophils Absolute: 0 10*3/uL (ref 0.0–0.5)
HCT: 38.1 % (ref 34.8–46.6)
HGB: 12.9 g/dL (ref 11.6–15.9)
LYMPH%: 27.1 % (ref 14.0–49.7)
MCH: 29.2 pg (ref 25.1–34.0)
MCHC: 33.9 g/dL (ref 31.5–36.0)
MCV: 86.2 fL (ref 79.5–101.0)
MONO#: 0.4 10*3/uL (ref 0.1–0.9)
MONO%: 18.6 % — AB (ref 0.0–14.0)
NEUT#: 1.1 10*3/uL — ABNORMAL LOW (ref 1.5–6.5)
NEUT%: 53.3 % (ref 38.4–76.8)
Platelets: 132 10*3/uL — ABNORMAL LOW (ref 145–400)
RBC: 4.42 10*6/uL (ref 3.70–5.45)
RDW: 14.2 % (ref 11.2–14.5)
WBC: 2 10*3/uL — ABNORMAL LOW (ref 3.9–10.3)
lymph#: 0.5 10*3/uL — ABNORMAL LOW (ref 0.9–3.3)

## 2014-05-31 LAB — COMPREHENSIVE METABOLIC PANEL (CC13)
ALBUMIN: 3.7 g/dL (ref 3.5–5.0)
ALK PHOS: 56 U/L (ref 40–150)
ALT: 20 U/L (ref 0–55)
ANION GAP: 11 meq/L (ref 3–11)
AST: 18 U/L (ref 5–34)
BUN: 10.3 mg/dL (ref 7.0–26.0)
CALCIUM: 8.5 mg/dL (ref 8.4–10.4)
CHLORIDE: 107 meq/L (ref 98–109)
CO2: 24 mEq/L (ref 22–29)
Creatinine: 0.8 mg/dL (ref 0.6–1.1)
EGFR: 84 mL/min/{1.73_m2} — AB (ref >90)
Glucose: 116 mg/dl (ref 70–140)
Potassium: 3.8 mEq/L (ref 3.5–5.1)
Sodium: 142 mEq/L (ref 136–145)
Total Bilirubin: 0.42 mg/dL (ref 0.20–1.20)
Total Protein: 7.2 g/dL (ref 6.4–8.3)

## 2014-05-31 MED ORDER — ACETAMINOPHEN 325 MG PO TABS
ORAL_TABLET | ORAL | Status: AC
  Filled 2014-05-31: qty 2

## 2014-05-31 MED ORDER — ACETAMINOPHEN 325 MG PO TABS
650.0000 mg | ORAL_TABLET | Freq: Once | ORAL | Status: AC
  Administered 2014-05-31: 650 mg via ORAL

## 2014-05-31 MED ORDER — RITUXIMAB CHEMO INJECTION 500 MG/50ML
375.0000 mg/m2 | Freq: Once | INTRAVENOUS | Status: AC
  Administered 2014-05-31: 700 mg via INTRAVENOUS
  Filled 2014-05-31: qty 70

## 2014-05-31 MED ORDER — DIPHENHYDRAMINE HCL 25 MG PO CAPS
ORAL_CAPSULE | ORAL | Status: AC
Start: 2014-05-31 — End: 2014-05-31
  Filled 2014-05-31: qty 2

## 2014-05-31 MED ORDER — SODIUM CHLORIDE 0.9 % IV SOLN
Freq: Once | INTRAVENOUS | Status: AC
  Administered 2014-05-31: 11:00:00 via INTRAVENOUS

## 2014-05-31 MED ORDER — DIPHENHYDRAMINE HCL 25 MG PO CAPS
50.0000 mg | ORAL_CAPSULE | Freq: Once | ORAL | Status: AC
  Administered 2014-05-31: 50 mg via ORAL

## 2014-05-31 NOTE — Patient Instructions (Signed)
[facility] Discharge Instructions for Patients Receiving Chemotherapy  Today you received the following antibody: Rituxan  To help prevent nausea and vomiting after your treatment, we encourage you to take your nausea medication as directed   If you develop nausea and vomiting that is not controlled by your nausea medication, call the clinic.   BELOW ARE SYMPTOMS THAT SHOULD BE REPORTED IMMEDIATELY:  *FEVER GREATER THAN 100.5 F  *CHILLS WITH OR WITHOUT FEVER  NAUSEA AND VOMITING THAT IS NOT CONTROLLED WITH YOUR NAUSEA MEDICATION  *UNUSUAL SHORTNESS OF BREATH  *UNUSUAL BRUISING OR BLEEDING  TENDERNESS IN MOUTH AND THROAT WITH OR WITHOUT PRESENCE OF ULCERS  *URINARY PROBLEMS  *BOWEL PROBLEMS  UNUSUAL RASH Items with * indicate a potential emergency and should be followed up as soon as possible.  Feel free to call the clinic should you have any questions or concerns. The clinic phone number is [phone].

## 2014-06-07 ENCOUNTER — Other Ambulatory Visit (HOSPITAL_BASED_OUTPATIENT_CLINIC_OR_DEPARTMENT_OTHER): Payer: Commercial Managed Care - HMO

## 2014-06-07 ENCOUNTER — Ambulatory Visit (HOSPITAL_BASED_OUTPATIENT_CLINIC_OR_DEPARTMENT_OTHER): Payer: Commercial Managed Care - HMO

## 2014-06-07 ENCOUNTER — Other Ambulatory Visit: Payer: Commercial Managed Care - HMO

## 2014-06-07 DIAGNOSIS — M0500 Felty's syndrome, unspecified site: Secondary | ICD-10-CM

## 2014-06-07 DIAGNOSIS — D704 Cyclic neutropenia: Secondary | ICD-10-CM

## 2014-06-07 DIAGNOSIS — M069 Rheumatoid arthritis, unspecified: Secondary | ICD-10-CM

## 2014-06-07 DIAGNOSIS — M359 Systemic involvement of connective tissue, unspecified: Secondary | ICD-10-CM

## 2014-06-07 DIAGNOSIS — Z5112 Encounter for antineoplastic immunotherapy: Secondary | ICD-10-CM

## 2014-06-07 LAB — COMPREHENSIVE METABOLIC PANEL (CC13)
ALT: 17 U/L (ref 0–55)
ANION GAP: 7 meq/L (ref 3–11)
AST: 18 U/L (ref 5–34)
Albumin: 3.8 g/dL (ref 3.5–5.0)
Alkaline Phosphatase: 54 U/L (ref 40–150)
BUN: 9.8 mg/dL (ref 7.0–26.0)
CALCIUM: 9.6 mg/dL (ref 8.4–10.4)
CHLORIDE: 105 meq/L (ref 98–109)
CO2: 28 mEq/L (ref 22–29)
Creatinine: 0.7 mg/dL (ref 0.6–1.1)
GLUCOSE: 115 mg/dL (ref 70–140)
Potassium: 4.4 mEq/L (ref 3.5–5.1)
Sodium: 140 mEq/L (ref 136–145)
TOTAL PROTEIN: 7 g/dL (ref 6.4–8.3)
Total Bilirubin: 0.55 mg/dL (ref 0.20–1.20)

## 2014-06-07 LAB — CBC WITH DIFFERENTIAL/PLATELET
BASO%: 0.3 % (ref 0.0–2.0)
Basophils Absolute: 0 10*3/uL (ref 0.0–0.1)
EOS%: 0.3 % (ref 0.0–7.0)
Eosinophils Absolute: 0 10*3/uL (ref 0.0–0.5)
HCT: 37.5 % (ref 34.8–46.6)
HGB: 12.9 g/dL (ref 11.6–15.9)
LYMPH#: 0.6 10*3/uL — AB (ref 0.9–3.3)
LYMPH%: 16 % (ref 14.0–49.7)
MCH: 29.6 pg (ref 25.1–34.0)
MCHC: 34.4 g/dL (ref 31.5–36.0)
MCV: 86 fL (ref 79.5–101.0)
MONO#: 0.1 10*3/uL (ref 0.1–0.9)
MONO%: 3 % (ref 0.0–14.0)
NEUT#: 3.2 10*3/uL (ref 1.5–6.5)
NEUT%: 80.4 % — AB (ref 38.4–76.8)
Platelets: 156 10*3/uL (ref 145–400)
RBC: 4.36 10*6/uL (ref 3.70–5.45)
RDW: 14.4 % (ref 11.2–14.5)
WBC: 4 10*3/uL (ref 3.9–10.3)

## 2014-06-07 MED ORDER — SODIUM CHLORIDE 0.9 % IV SOLN
375.0000 mg/m2 | Freq: Once | INTRAVENOUS | Status: AC
  Administered 2014-06-07: 700 mg via INTRAVENOUS
  Filled 2014-06-07: qty 70

## 2014-06-07 MED ORDER — DIPHENHYDRAMINE HCL 25 MG PO CAPS
50.0000 mg | ORAL_CAPSULE | Freq: Once | ORAL | Status: AC
  Administered 2014-06-07: 50 mg via ORAL

## 2014-06-07 MED ORDER — ACETAMINOPHEN 325 MG PO TABS
650.0000 mg | ORAL_TABLET | Freq: Once | ORAL | Status: AC
  Administered 2014-06-07: 650 mg via ORAL

## 2014-06-07 MED ORDER — DIPHENHYDRAMINE HCL 25 MG PO CAPS
ORAL_CAPSULE | ORAL | Status: AC
Start: 2014-06-07 — End: 2014-06-07
  Filled 2014-06-07: qty 2

## 2014-06-07 MED ORDER — ACETAMINOPHEN 325 MG PO TABS
ORAL_TABLET | ORAL | Status: AC
  Filled 2014-06-07: qty 2

## 2014-06-07 MED ORDER — SODIUM CHLORIDE 0.9 % IV SOLN
Freq: Once | INTRAVENOUS | Status: AC
  Administered 2014-06-07: 14:00:00 via INTRAVENOUS

## 2014-06-07 NOTE — Patient Instructions (Signed)
[facility] Discharge Instructions for Patients Receiving Chemotherapy  Today you received the following chemotherapy agents Rituxan.  To help prevent nausea and vomiting after your treatment, we encourage you to take your nausea medication as prescribed.   If you develop nausea and vomiting that is not controlled by your nausea medication, call the clinic.   BELOW ARE SYMPTOMS THAT SHOULD BE REPORTED IMMEDIATELY:  *FEVER GREATER THAN 100.5 F  *CHILLS WITH OR WITHOUT FEVER  NAUSEA AND VOMITING THAT IS NOT CONTROLLED WITH YOUR NAUSEA MEDICATION  *UNUSUAL SHORTNESS OF BREATH  *UNUSUAL BRUISING OR BLEEDING  TENDERNESS IN MOUTH AND THROAT WITH OR WITHOUT PRESENCE OF ULCERS  *URINARY PROBLEMS  *BOWEL PROBLEMS  UNUSUAL RASH Items with * indicate a potential emergency and should be followed up as soon as possible.  Feel free to call the clinic you have any questions or concerns. The clinic phone number is [phone].    

## 2014-06-14 ENCOUNTER — Ambulatory Visit (HOSPITAL_BASED_OUTPATIENT_CLINIC_OR_DEPARTMENT_OTHER): Payer: Commercial Managed Care - HMO

## 2014-06-14 ENCOUNTER — Telehealth: Payer: Self-pay | Admitting: *Deleted

## 2014-06-14 ENCOUNTER — Other Ambulatory Visit (HOSPITAL_BASED_OUTPATIENT_CLINIC_OR_DEPARTMENT_OTHER): Payer: Commercial Managed Care - HMO

## 2014-06-14 ENCOUNTER — Encounter: Payer: Self-pay | Admitting: Hematology and Oncology

## 2014-06-14 ENCOUNTER — Ambulatory Visit (HOSPITAL_BASED_OUTPATIENT_CLINIC_OR_DEPARTMENT_OTHER): Payer: Commercial Managed Care - HMO | Admitting: Hematology and Oncology

## 2014-06-14 ENCOUNTER — Telehealth: Payer: Self-pay | Admitting: Hematology and Oncology

## 2014-06-14 VITALS — BP 140/77 | HR 92 | Temp 98.0°F | Resp 18 | Ht 62.0 in | Wt 177.8 lb

## 2014-06-14 DIAGNOSIS — M069 Rheumatoid arthritis, unspecified: Secondary | ICD-10-CM

## 2014-06-14 DIAGNOSIS — D704 Cyclic neutropenia: Secondary | ICD-10-CM

## 2014-06-14 DIAGNOSIS — M359 Systemic involvement of connective tissue, unspecified: Secondary | ICD-10-CM

## 2014-06-14 DIAGNOSIS — D708 Other neutropenia: Secondary | ICD-10-CM

## 2014-06-14 DIAGNOSIS — M0500 Felty's syndrome, unspecified site: Secondary | ICD-10-CM

## 2014-06-14 DIAGNOSIS — Z5112 Encounter for antineoplastic immunotherapy: Secondary | ICD-10-CM

## 2014-06-14 LAB — CBC WITH DIFFERENTIAL/PLATELET
BASO%: 0.7 % (ref 0.0–2.0)
BASOS ABS: 0 10*3/uL (ref 0.0–0.1)
EOS ABS: 0 10*3/uL (ref 0.0–0.5)
EOS%: 0.7 % (ref 0.0–7.0)
HCT: 40.2 % (ref 34.8–46.6)
HGB: 13.7 g/dL (ref 11.6–15.9)
LYMPH#: 0.8 10*3/uL — AB (ref 0.9–3.3)
LYMPH%: 28.6 % (ref 14.0–49.7)
MCH: 29.3 pg (ref 25.1–34.0)
MCHC: 34.1 g/dL (ref 31.5–36.0)
MCV: 86.1 fL (ref 79.5–101.0)
MONO#: 0.5 10*3/uL (ref 0.1–0.9)
MONO%: 17.9 % — ABNORMAL HIGH (ref 0.0–14.0)
NEUT#: 1.5 10*3/uL (ref 1.5–6.5)
NEUT%: 52.1 % (ref 38.4–76.8)
Platelets: 186 10*3/uL (ref 145–400)
RBC: 4.67 10*6/uL (ref 3.70–5.45)
RDW: 14.9 % — ABNORMAL HIGH (ref 11.2–14.5)
WBC: 2.9 10*3/uL — ABNORMAL LOW (ref 3.9–10.3)

## 2014-06-14 LAB — COMPREHENSIVE METABOLIC PANEL (CC13)
ALT: 20 U/L (ref 0–55)
AST: 17 U/L (ref 5–34)
Albumin: 3.9 g/dL (ref 3.5–5.0)
Alkaline Phosphatase: 57 U/L (ref 40–150)
Anion Gap: 8 mEq/L (ref 3–11)
BUN: 11.5 mg/dL (ref 7.0–26.0)
CALCIUM: 9.5 mg/dL (ref 8.4–10.4)
CO2: 27 meq/L (ref 22–29)
CREATININE: 0.7 mg/dL (ref 0.6–1.1)
Chloride: 103 mEq/L (ref 98–109)
EGFR: 87 mL/min/{1.73_m2} — AB (ref >90)
Glucose: 107 mg/dl (ref 70–140)
Potassium: 4.3 mEq/L (ref 3.5–5.1)
Sodium: 138 mEq/L (ref 136–145)
Total Bilirubin: 0.45 mg/dL (ref 0.20–1.20)
Total Protein: 7.5 g/dL (ref 6.4–8.3)

## 2014-06-14 MED ORDER — SODIUM CHLORIDE 0.9 % IV SOLN
375.0000 mg/m2 | Freq: Once | INTRAVENOUS | Status: AC
  Administered 2014-06-14: 700 mg via INTRAVENOUS
  Filled 2014-06-14: qty 70

## 2014-06-14 MED ORDER — DIPHENHYDRAMINE HCL 25 MG PO CAPS
ORAL_CAPSULE | ORAL | Status: AC
  Filled 2014-06-14: qty 2

## 2014-06-14 MED ORDER — ACETAMINOPHEN 325 MG PO TABS
650.0000 mg | ORAL_TABLET | Freq: Once | ORAL | Status: AC
  Administered 2014-06-14: 650 mg via ORAL

## 2014-06-14 MED ORDER — SODIUM CHLORIDE 0.9 % IV SOLN
Freq: Once | INTRAVENOUS | Status: AC
  Administered 2014-06-14: 10:00:00 via INTRAVENOUS

## 2014-06-14 MED ORDER — DIPHENHYDRAMINE HCL 25 MG PO CAPS
50.0000 mg | ORAL_CAPSULE | Freq: Once | ORAL | Status: AC
  Administered 2014-06-14: 50 mg via ORAL

## 2014-06-14 MED ORDER — ACETAMINOPHEN 325 MG PO TABS
ORAL_TABLET | ORAL | Status: AC
  Filled 2014-06-14: qty 2

## 2014-06-14 NOTE — Assessment & Plan Note (Signed)
This is stable recently. I recommend prednisone taper to 7.5 mg daily

## 2014-06-14 NOTE — Telephone Encounter (Signed)
Gave avs & cal for Jan/Feb. Sent mess to sch tx. °

## 2014-06-14 NOTE — Progress Notes (Signed)
[place], [place] SUMMARY OF HEMATOLOGIC HISTORY: This patient had been followed here for many years. 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 Bone marrow aspirate and biopsy was performed in 2002 and 2004 with no abnormalities found. The patient was treated with G-CSF with good response. She was subsequently started on methotrexate weekly along with prednisone.  On 09/09/2013, methotrexate was discontinued due to severe neutropenia. She was started on G-CSF injection daily. From 10/14/2013 to 11/04/13, she was started on weekly rituximab. In October 2015, her neutropenia has relapsed. From 04/07/2014 to 04/28/14, she received rituximab again. From 05/31/14, Rituximab is restarted  INTERVAL HISTORY: [name] 64 y.o. female returns for further follow-up. She felt great. Her joint pain is stable. She denies any recent fever, chills, night sweats or abnormal weight loss She denies recent infection  I have reviewed the past medical history, past surgical history, social history and family history with the patient and they are unchanged from previous note.  ALLERGIES:  is allergic to penicillins.  MEDICATIONS:  Current Outpatient Prescriptions  Medication Sig Dispense Refill  . Alum & Mag Hydroxide-Simeth (MAGIC MOUTHWASH W/LIDOCAINE) SOLN Take 5 mLs by mouth 3 (three) times daily as needed. 240 mL 0  . Ascorbic Acid (VITAMIN C) 1000 MG tablet Take 1,000 mg by mouth daily.    . calcium carbonate (CALCIUM 600) 600 MG TABS Take 600 mg by mouth 2 (two) times daily with a meal.      . Cholecalciferol (VITAMIN D) 1000 UNITS capsule Take 1,000 Units by mouth daily.     . cyclobenzaprine (FLEXERIL) 10 MG tablet Take 1 tablet (10 mg total) by mouth 3 (three) times daily as needed for muscle spasms. 90 tablet 2  . diclofenac sodium (VOLTAREN) 1 % GEL Apply 2 g topically 4 (four) times  daily. 3 Tube 2  . DULoxetine (CYMBALTA) 30 MG capsule Take 30 mg by mouth daily.    Marland Kitchen HYDROcodone-acetaminophen (NORCO) 7.5-325 MG per tablet Take 1 tablet by mouth every 4 (four) hours as needed. Max 6 per day 180 tablet 0  . Melatonin 10 MG CAPS Take 5 mg by mouth at bedtime.     . methotrexate (RHEUMATREX) 10 MG tablet Take 1 tablet (10 mg total) by mouth once a week. Caution: Chemotherapy. Protect from light. 4 tablet 3  . nortriptyline (PAMELOR) 50 MG capsule TAKE 2 CAPSULES BY MOUTH AT BEDTIME 180 capsule 0  . omeprazole (PRILOSEC) 20 MG capsule Take 20 mg by mouth daily.     . predniSONE (DELTASONE) 10 MG tablet TAKE DAILY BY MOUTH AS DIRECTED BY PHYSICIAN. 60 tablet 3  . predniSONE (DELTASONE) 5 MG tablet Take 1.5 tablets (7.5 mg total) by mouth daily with breakfast. 90 tablet 3  . pregabalin (LYRICA) 200 MG capsule TAKE 1 CAPSULE BY MOUTH TWICE DAILY  *90 day supply* 180 capsule 0  . Probiotic Product (PROBIOTIC FORMULA) CAPS Take 1 capsule by mouth daily.      . vitamin B-12 (CYANOCOBALAMIN) 1000 MCG tablet Take 1,000 mcg by mouth every other day.     . zinc gluconate 50 MG tablet Take 100 mg by mouth daily.    . [DISCONTINUED] POTASSIUM PO Take 20 mEq by mouth 2 (two) times daily.     No current facility-administered medications for this visit.     REVIEW OF SYSTEMS:   Constitutional: Denies fevers, chills or night sweats Eyes: Denies blurriness of  vision Ears, nose, mouth, throat, and face: Denies mucositis or sore throat Respiratory: Denies cough, dyspnea or wheezes Cardiovascular: Denies palpitation, chest discomfort or lower extremity swelling Gastrointestinal:  Denies nausea, heartburn or change in bowel habits Skin: Denies abnormal skin rashes Lymphatics: Denies new lymphadenopathy or easy bruising Neurological:Denies numbness, tingling or new weaknesses Behavioral/Psych: Mood is stable, no new changes  All other systems were reviewed with the patient and are  negative.  PHYSICAL EXAMINATION: ECOG PERFORMANCE STATUS: 0 - Asymptomatic  Filed Vitals:   06/14/14 0922  BP: 140/77  Pulse: 92  Temp: 98 F (36.7 C)  Resp: 18   Filed Weights   06/14/14 0922  Weight: 177 lb 12.8 oz (80.65 kg)    GENERAL:alert, no distress and comfortable SKIN: skin color, texture, turgor are normal, no rashes or significant lesions EYES: normal, Conjunctiva are pink and non-injected, sclera clear OROPHARYNX:no exudate, no erythema and lips, buccal mucosa, and tongue normal  NECK: supple, thyroid normal size, non-tender, without nodularity LYMPH:  no palpable lymphadenopathy in the cervical, axillary or inguinal LUNGS: clear to auscultation and percussion with normal breathing effort HEART: regular rate & rhythm and no murmurs and no lower extremity edema ABDOMEN:abdomen soft, non-tender and normal bowel sounds Musculoskeletal:no cyanosis of digits and no clubbing. She has stigmata of RA in her hands NEURO: alert & oriented x 3 with fluent speech, no focal motor/sensory deficits  LABORATORY DATA:  I have reviewed the data as listed Results for orders placed or performed in visit on 06/14/14 (from the past 48 hour(s))  CBC with Differential     Status: Abnormal   Collection Time: 06/14/14  9:09 AM  Result Value Ref Range   WBC 2.9 (L) 3.9 - 10.3 10e3/uL   NEUT# 1.5 1.5 - 6.5 10e3/uL   HGB 13.7 11.6 - 15.9 g/dL   HCT 40.2 34.8 - 46.6 %   Platelets 186 145 - 400 10e3/uL   MCV 86.1 79.5 - 101.0 fL   MCH 29.3 25.1 - 34.0 pg   MCHC 34.1 31.5 - 36.0 g/dL   RBC 4.67 3.70 - 5.45 10e6/uL   RDW 14.9 (H) 11.2 - 14.5 %   lymph# 0.8 (L) 0.9 - 3.3 10e3/uL   MONO# 0.5 0.1 - 0.9 10e3/uL   Eosinophils Absolute 0.0 0.0 - 0.5 10e3/uL   Basophils Absolute 0.0 0.0 - 0.1 10e3/uL   NEUT% 52.1 38.4 - 76.8 %   LYMPH% 28.6 14.0 - 49.7 %   MONO% 17.9 (H) 0.0 - 14.0 %   EOS% 0.7 0.0 - 7.0 %   BASO% 0.7 0.0 - 2.0 %  Comprehensive metabolic panel     Status: Abnormal    Collection Time: 06/14/14  9:10 AM  Result Value Ref Range   Sodium 138 136 - 145 mEq/L   Potassium 4.3 3.5 - 5.1 mEq/L   Chloride 103 98 - 109 mEq/L   CO2 27 22 - 29 mEq/L   Glucose 107 70 - 140 mg/dl   BUN 11.5 7.0 - 26.0 mg/dL   Creatinine 0.7 0.6 - 1.1 mg/dL   Total Bilirubin 0.45 0.20 - 1.20 mg/dL   Alkaline Phosphatase 57 40 - 150 U/L   AST 17 5 - 34 U/L   ALT 20 0 - 55 U/L   Total Protein 7.5 6.4 - 8.3 g/dL   Albumin 3.9 3.5 - 5.0 g/dL   Calcium 9.5 8.4 - 10.4 mg/dL   Anion Gap 8 3 - 11 mEq/L   EGFR 87 (L) >  90 ml/min/1.73 m2    Comment: eGFR is calculated using the CKD-EPI Creatinine Equation (2009)    Lab Results  Component Value Date   WBC 2.9* 06/14/2014   HGB 13.7 06/14/2014   HCT 40.2 06/14/2014   MCV 86.1 06/14/2014   PLT 186 06/14/2014    ASSESSMENT & PLAN:  Felty's syndrome She had received 4 doses of weekly rituximab completed in May 2015. She had several months of blood count but now her leukopenia has relapsed with prednisone taper.  We discussed the risks, benefit, side effects of repeat treatment with rituximab and she agreed to proceed. Treatment was restarted again on 04/07/2014 with good response, but relapsed within 1 month of discontinuation. I recommend repeat treatment X 4 weeks and plan to go on maintenance rituxan monthly afterwards In the meantime, I would recommend tapering her prednisone treatments to 7.5 mg daily  Neutropenia associated with autoimmune disease She will get GCSF injection whenever ANC <1000. She is not symptomatic  Rheumatoid arthritis This is stable recently. I recommend prednisone taper to 7.5 mg daily     All questions were answered. The patient knows to call the clinic with any problems, questions or concerns. No barriers to learning was detected.  I spent 25 minutes counseling the patient face to face. The total time spent in the appointment was 30 minutes and more than 50% was on counseling.     [facility], [place],  MD 06/14/2014 6:38 PM

## 2014-06-14 NOTE — Assessment & Plan Note (Addendum)
She had received 4 doses of weekly rituximab completed in May 2015. She had several months of blood count but now her leukopenia has relapsed with prednisone taper.  We discussed the risks, benefit, side effects of repeat treatment with rituximab and she agreed to proceed. Treatment was restarted again on 04/07/2014 with good response, but relapsed within 1 month of discontinuation. I recommend repeat treatment X 4 weeks and plan to go on maintenance rituxan monthly afterwards In the meantime, I would recommend tapering her prednisone treatments to 7.5 mg daily

## 2014-06-14 NOTE — Patient Instructions (Signed)
[facility] Discharge Instructions for Patients Receiving Chemotherapy  Today you received the following chemotherapy agents Rituximab  To help prevent nausea and vomiting after your treatment, we encourage you to take your nausea medication as directed.    If you develop nausea and vomiting that is not controlled by your nausea medication, call the clinic.   BELOW ARE SYMPTOMS THAT SHOULD BE REPORTED IMMEDIATELY:  *FEVER GREATER THAN 100.5 F  *CHILLS WITH OR WITHOUT FEVER  NAUSEA AND VOMITING THAT IS NOT CONTROLLED WITH YOUR NAUSEA MEDICATION  *UNUSUAL SHORTNESS OF BREATH  *UNUSUAL BRUISING OR BLEEDING  TENDERNESS IN MOUTH AND THROAT WITH OR WITHOUT PRESENCE OF ULCERS  *URINARY PROBLEMS  *BOWEL PROBLEMS  UNUSUAL RASH Items with * indicate a potential emergency and should be followed up as soon as possible.  Feel free to call the clinic you have any questions or concerns. The clinic phone number is [phone].    

## 2014-06-14 NOTE — Telephone Encounter (Signed)
Per staff message and POF I have scheduled appts. Advised scheduler of appts. JMW  

## 2014-06-14 NOTE — Assessment & Plan Note (Signed)
She will get GCSF injection whenever ANC <1000. She is not symptomatic

## 2014-06-21 ENCOUNTER — Ambulatory Visit: Payer: Commercial Managed Care - HMO

## 2014-06-21 ENCOUNTER — Other Ambulatory Visit: Payer: Commercial Managed Care - HMO

## 2014-06-23 ENCOUNTER — Other Ambulatory Visit (HOSPITAL_BASED_OUTPATIENT_CLINIC_OR_DEPARTMENT_OTHER): Payer: Commercial Managed Care - HMO

## 2014-06-23 ENCOUNTER — Ambulatory Visit (HOSPITAL_BASED_OUTPATIENT_CLINIC_OR_DEPARTMENT_OTHER): Payer: Commercial Managed Care - HMO

## 2014-06-23 DIAGNOSIS — M069 Rheumatoid arthritis, unspecified: Secondary | ICD-10-CM

## 2014-06-23 DIAGNOSIS — D704 Cyclic neutropenia: Secondary | ICD-10-CM

## 2014-06-23 DIAGNOSIS — M0500 Felty's syndrome, unspecified site: Secondary | ICD-10-CM

## 2014-06-23 DIAGNOSIS — M359 Systemic involvement of connective tissue, unspecified: Secondary | ICD-10-CM

## 2014-06-23 LAB — CBC WITH DIFFERENTIAL/PLATELET
BASO%: 0.9 % (ref 0.0–2.0)
BASOS ABS: 0 10*3/uL (ref 0.0–0.1)
EOS%: 0.6 % (ref 0.0–7.0)
Eosinophils Absolute: 0 10*3/uL (ref 0.0–0.5)
HEMATOCRIT: 39.6 % (ref 34.8–46.6)
HEMOGLOBIN: 13.8 g/dL (ref 11.6–15.9)
LYMPH#: 0.7 10*3/uL — AB (ref 0.9–3.3)
LYMPH%: 21.5 % (ref 14.0–49.7)
MCH: 30.3 pg (ref 25.1–34.0)
MCHC: 34.8 g/dL (ref 31.5–36.0)
MCV: 86.8 fL (ref 79.5–101.0)
MONO#: 0.4 10*3/uL (ref 0.1–0.9)
MONO%: 12.3 % (ref 0.0–14.0)
NEUT#: 2.1 10*3/uL (ref 1.5–6.5)
NEUT%: 64.7 % (ref 38.4–76.8)
Platelets: 151 10*3/uL (ref 145–400)
RBC: 4.56 10*6/uL (ref 3.70–5.45)
RDW: 14.9 % — AB (ref 11.2–14.5)
WBC: 3.3 10*3/uL — AB (ref 3.9–10.3)

## 2014-06-23 LAB — COMPREHENSIVE METABOLIC PANEL (CC13)
ALT: 15 U/L (ref 0–55)
ANION GAP: 10 meq/L (ref 3–11)
AST: 15 U/L (ref 5–34)
Albumin: 3.9 g/dL (ref 3.5–5.0)
Alkaline Phosphatase: 54 U/L (ref 40–150)
BILIRUBIN TOTAL: 0.39 mg/dL (ref 0.20–1.20)
BUN: 9.5 mg/dL (ref 7.0–26.0)
CALCIUM: 9.5 mg/dL (ref 8.4–10.4)
CO2: 27 mEq/L (ref 22–29)
Chloride: 104 mEq/L (ref 98–109)
Creatinine: 0.8 mg/dL (ref 0.6–1.1)
EGFR: 80 mL/min/{1.73_m2} — AB (ref >90)
Glucose: 108 mg/dl (ref 70–140)
POTASSIUM: 4.3 meq/L (ref 3.5–5.1)
Sodium: 142 mEq/L (ref 136–145)
Total Protein: 7.3 g/dL (ref 6.4–8.3)

## 2014-06-23 MED ORDER — DIPHENHYDRAMINE HCL 25 MG PO CAPS
50.0000 mg | ORAL_CAPSULE | Freq: Once | ORAL | Status: AC
  Administered 2014-06-23: 50 mg via ORAL

## 2014-06-23 MED ORDER — SODIUM CHLORIDE 0.9 % IV SOLN
Freq: Once | INTRAVENOUS | Status: AC
  Administered 2014-06-23: 11:00:00 via INTRAVENOUS

## 2014-06-23 MED ORDER — DIPHENHYDRAMINE HCL 25 MG PO CAPS
ORAL_CAPSULE | ORAL | Status: AC
  Filled 2014-06-23: qty 2

## 2014-06-23 MED ORDER — ACETAMINOPHEN 325 MG PO TABS
ORAL_TABLET | ORAL | Status: AC
  Filled 2014-06-23: qty 2

## 2014-06-23 MED ORDER — ACETAMINOPHEN 325 MG PO TABS
650.0000 mg | ORAL_TABLET | Freq: Once | ORAL | Status: AC
  Administered 2014-06-23: 650 mg via ORAL

## 2014-06-23 MED ORDER — SODIUM CHLORIDE 0.9 % IV SOLN
375.0000 mg/m2 | Freq: Once | INTRAVENOUS | Status: AC
  Administered 2014-06-23: 700 mg via INTRAVENOUS
  Filled 2014-06-23: qty 70

## 2014-06-23 NOTE — Patient Instructions (Signed)
[facility] Discharge Instructions for Patients Receiving Chemotherapy  Today you received the following chemotherapy agents Rituxan  To help prevent nausea and vomiting after your treatment, we encourage you to take your nausea medication as directed.   If you develop nausea and vomiting that is not controlled by your nausea medication, call the clinic.   BELOW ARE SYMPTOMS THAT SHOULD BE REPORTED IMMEDIATELY:  *FEVER GREATER THAN 100.5 F  *CHILLS WITH OR WITHOUT FEVER  NAUSEA AND VOMITING THAT IS NOT CONTROLLED WITH YOUR NAUSEA MEDICATION  *UNUSUAL SHORTNESS OF BREATH  *UNUSUAL BRUISING OR BLEEDING  TENDERNESS IN MOUTH AND THROAT WITH OR WITHOUT PRESENCE OF ULCERS  *URINARY PROBLEMS  *BOWEL PROBLEMS  UNUSUAL RASH Items with * indicate a potential emergency and should be followed up as soon as possible.  Feel free to call the clinic you have any questions or concerns. The clinic phone number is [phone].   Rituximab injection What is this medicine? RITUXIMAB (ri TUX i mab) is a monoclonal antibody. This medicine changes the way the body's immune system works. It is used commonly to treat non-Hodgkin's lymphoma and other conditions. In cancer cells, this drug targets a specific protein within cancer cells and stops the cancer cells from growing. It is also used to treat rhuematoid arthritis (RA). In RA, this medicine slow the inflammatory process and help reduce joint pain and swelling. This medicine is often used with other cancer or arthritis medications. This medicine may be used for other purposes; ask your health care provider or pharmacist if you have questions. COMMON BRAND NAME(S): Rituxan What should I tell my health care provider before I take this medicine? They need to know if you have any of these conditions: -blood disorders -heart disease -history of hepatitis B -infection (especially a virus infection such as chickenpox, cold sores, or  herpes) -irregular heartbeat -kidney disease -lung or breathing disease, like asthma -lupus -an unusual or allergic reaction to rituximab, mouse proteins, other medicines, foods, dyes, or preservatives -pregnant or trying to get pregnant -breast-feeding How should I use this medicine? This medicine is for infusion into a vein. It is administered in a hospital or clinic by a specially trained health care professional. A special MedGuide will be given to you by the pharmacist with each prescription and refill. Be sure to read this information carefully each time. Talk to your pediatrician regarding the use of this medicine in children. This medicine is not approved for use in children. Overdosage: If you think you have taken too much of this medicine contact a poison control center or emergency room at once. NOTE: This medicine is only for you. Do not share this medicine with others. What if I miss a dose? It is important not to miss a dose. Call your doctor or health care professional if you are unable to keep an appointment. What may interact with this medicine? -cisplatin -medicines for blood pressure -some other medicines for arthritis -vaccines This list may not describe all possible interactions. Give your health care provider a list of all the medicines, herbs, non-prescription drugs, or dietary supplements you use. Also tell them if you smoke, drink alcohol, or use illegal drugs. Some items may interact with your medicine. What should I watch for while using this medicine? Report any side effects that you notice during your treatment right away, such as changes in your breathing, fever, chills, dizziness or lightheadedness. These effects are more common with the first dose. Visit your prescriber or  health care professional for checks on your progress. You will need to have regular blood work. Report any other side effects. The side effects of this medicine can continue after you finish  your treatment. Continue your course of treatment even though you feel ill unless your doctor tells you to stop. Call your doctor or health care professional for advice if you get a fever, chills or sore throat, or other symptoms of a cold or flu. Do not treat yourself. This drug decreases your body's ability to fight infections. Try to avoid being around people who are sick. This medicine may increase your risk to bruise or bleed. Call your doctor or health care professional if you notice any unusual bleeding. Be careful brushing and flossing your teeth or using a toothpick because you may get an infection or bleed more easily. If you have any dental work done, tell your dentist you are receiving this medicine. Avoid taking products that contain aspirin, acetaminophen, ibuprofen, naproxen, or ketoprofen unless instructed by your doctor. These medicines may hide a fever. Do not become pregnant while taking this medicine. Women should inform their doctor if they wish to become pregnant or think they might be pregnant. There is a potential for serious side effects to an unborn child. Talk to your health care professional or pharmacist for more information. Do not breast-feed an infant while taking this medicine. What side effects may I notice from receiving this medicine? Side effects that you should report to your doctor or health care professional as soon as possible: -allergic reactions like skin rash, itching or hives, swelling of the face, lips, or tongue -low blood counts - this medicine may decrease the number of white blood cells, red blood cells and platelets. You may be at increased risk for infections and bleeding. -signs of infection - fever or chills, cough, sore throat, pain or difficulty passing urine -signs of decreased platelets or bleeding - bruising, pinpoint red spots on the skin, black, tarry stools, blood in the urine -signs of decreased red blood cells - unusually weak or tired,  fainting spells, lightheadedness -breathing problems -confused, not responsive -chest pain -fast, irregular heartbeat -feeling faint or lightheaded, falls -mouth sores -redness, blistering, peeling or loosening of the skin, including inside the mouth -stomach pain -swelling of the ankles, feet, or hands -trouble passing urine or change in the amount of urine Side effects that usually do not require medical attention (report to your doctor or other health care professional if they continue or are bothersome): -anxiety -headache -loss of appetite -muscle aches -nausea -night sweats This list may not describe all possible side effects. Call your doctor for medical advice about side effects. You may report side effects to FDA at 1-800-FDA-1088. Where should I keep my medicine? This drug is given in a hospital or clinic and will not be stored at home. NOTE: This sheet is a summary. It may not cover all possible information. If you have questions about this medicine, talk to your doctor, pharmacist, or health care provider.  2015, [employer]. (2008-01-24 14:04:59)

## 2014-06-24 ENCOUNTER — Other Ambulatory Visit: Payer: Self-pay | Admitting: Physical Medicine & Rehabilitation

## 2014-06-26 NOTE — Telephone Encounter (Signed)
Patient needs appt for future refills

## 2014-07-04 ENCOUNTER — Telehealth: Payer: Self-pay | Admitting: Hematology and Oncology

## 2014-07-04 NOTE — Telephone Encounter (Signed)
returned call and s.w pt and r/s appt per pt request....pt ok adn aware

## 2014-07-14 ENCOUNTER — Other Ambulatory Visit: Payer: Self-pay | Admitting: Hematology and Oncology

## 2014-07-14 ENCOUNTER — Ambulatory Visit: Payer: Commercial Managed Care - HMO

## 2014-07-14 ENCOUNTER — Other Ambulatory Visit: Payer: Commercial Managed Care - HMO

## 2014-07-17 ENCOUNTER — Ambulatory Visit: Payer: Commercial Managed Care - HMO

## 2014-07-17 ENCOUNTER — Other Ambulatory Visit (HOSPITAL_BASED_OUTPATIENT_CLINIC_OR_DEPARTMENT_OTHER): Payer: Commercial Managed Care - HMO

## 2014-07-17 DIAGNOSIS — M069 Rheumatoid arthritis, unspecified: Secondary | ICD-10-CM

## 2014-07-17 DIAGNOSIS — M359 Systemic involvement of connective tissue, unspecified: Secondary | ICD-10-CM

## 2014-07-17 DIAGNOSIS — M0500 Felty's syndrome, unspecified site: Secondary | ICD-10-CM

## 2014-07-17 DIAGNOSIS — D704 Cyclic neutropenia: Secondary | ICD-10-CM

## 2014-07-17 LAB — CBC WITH DIFFERENTIAL/PLATELET
BASO%: 0.7 % (ref 0.0–2.0)
Basophils Absolute: 0 10*3/uL (ref 0.0–0.1)
EOS%: 0.3 % (ref 0.0–7.0)
Eosinophils Absolute: 0 10*3/uL (ref 0.0–0.5)
HCT: 42 % (ref 34.8–46.6)
HEMOGLOBIN: 14.1 g/dL (ref 11.6–15.9)
LYMPH%: 13.9 % — AB (ref 14.0–49.7)
MCH: 29.7 pg (ref 25.1–34.0)
MCHC: 33.7 g/dL (ref 31.5–36.0)
MCV: 88.3 fL (ref 79.5–101.0)
MONO#: 0.6 10*3/uL (ref 0.1–0.9)
MONO%: 11.4 % (ref 0.0–14.0)
NEUT#: 3.8 10*3/uL (ref 1.5–6.5)
NEUT%: 73.7 % (ref 38.4–76.8)
Platelets: 194 10*3/uL (ref 145–400)
RBC: 4.75 10*6/uL (ref 3.70–5.45)
RDW: 15 % — AB (ref 11.2–14.5)
WBC: 5.1 10*3/uL (ref 3.9–10.3)
lymph#: 0.7 10*3/uL — ABNORMAL LOW (ref 0.9–3.3)

## 2014-07-17 LAB — COMPREHENSIVE METABOLIC PANEL (CC13)
ALT: 16 U/L (ref 0–55)
AST: 16 U/L (ref 5–34)
Albumin: 3.9 g/dL (ref 3.5–5.0)
Alkaline Phosphatase: 60 U/L (ref 40–150)
Anion Gap: 12 mEq/L — ABNORMAL HIGH (ref 3–11)
BUN: 17.2 mg/dL (ref 7.0–26.0)
CO2: 27 mEq/L (ref 22–29)
Calcium: 9.8 mg/dL (ref 8.4–10.4)
Chloride: 104 mEq/L (ref 98–109)
Creatinine: 0.8 mg/dL (ref 0.6–1.1)
EGFR: 80 mL/min/{1.73_m2} — ABNORMAL LOW (ref >90)
Glucose: 114 mg/dl (ref 70–140)
Potassium: 4.2 mEq/L (ref 3.5–5.1)
Sodium: 142 mEq/L (ref 136–145)
TOTAL PROTEIN: 7.7 g/dL (ref 6.4–8.3)
Total Bilirubin: 0.56 mg/dL (ref 0.20–1.20)

## 2014-07-17 MED ORDER — FILGRASTIM 480 MCG/0.8ML IJ SOSY: 480.0000 ug | PREFILLED_SYRINGE | Freq: Once | INTRAMUSCULAR | Status: DC

## 2014-07-17 MED ORDER — TBO-FILGRASTIM 480 MCG/0.8ML ~~LOC~~ SOSY
480.0000 ug | PREFILLED_SYRINGE | Freq: Once | SUBCUTANEOUS | Status: DC
  Filled 2014-07-17: qty 0.8

## 2014-07-25 ENCOUNTER — Other Ambulatory Visit: Payer: Self-pay | Admitting: *Deleted

## 2014-07-25 ENCOUNTER — Telehealth: Payer: Self-pay | Admitting: Hematology and Oncology

## 2014-07-25 NOTE — Telephone Encounter (Signed)
Pt called to cancelled states she is having an apt somewhere else and can't make it. Sent msg to MD, desk nurse, charge nurse in chemo and Sharyn Lull to get D/T to call pt back..... KJ

## 2014-07-26 ENCOUNTER — Telehealth: Payer: Self-pay | Admitting: *Deleted

## 2014-07-26 NOTE — Telephone Encounter (Signed)
Per staff message and POF I have scheduled appts. Advised scheduler of appts. JMW  

## 2014-07-28 ENCOUNTER — Ambulatory Visit: Payer: Commercial Managed Care - HMO | Admitting: Hematology and Oncology

## 2014-07-28 ENCOUNTER — Other Ambulatory Visit: Payer: Commercial Managed Care - HMO

## 2014-07-28 ENCOUNTER — Ambulatory Visit: Payer: Commercial Managed Care - HMO

## 2014-08-11 ENCOUNTER — Other Ambulatory Visit (HOSPITAL_BASED_OUTPATIENT_CLINIC_OR_DEPARTMENT_OTHER): Payer: Commercial Managed Care - HMO

## 2014-08-11 ENCOUNTER — Encounter: Payer: Self-pay | Admitting: Hematology and Oncology

## 2014-08-11 ENCOUNTER — Other Ambulatory Visit: Payer: Self-pay | Admitting: Hematology and Oncology

## 2014-08-11 ENCOUNTER — Telehealth: Payer: Self-pay | Admitting: Hematology and Oncology

## 2014-08-11 ENCOUNTER — Ambulatory Visit (HOSPITAL_BASED_OUTPATIENT_CLINIC_OR_DEPARTMENT_OTHER): Payer: Commercial Managed Care - HMO

## 2014-08-11 ENCOUNTER — Ambulatory Visit (HOSPITAL_BASED_OUTPATIENT_CLINIC_OR_DEPARTMENT_OTHER): Payer: Commercial Managed Care - HMO | Admitting: Hematology and Oncology

## 2014-08-11 VITALS — BP 113/71 | HR 93 | Temp 98.5°F | Resp 18 | Ht 62.0 in | Wt 180.8 lb

## 2014-08-11 DIAGNOSIS — M359 Systemic involvement of connective tissue, unspecified: Secondary | ICD-10-CM

## 2014-08-11 DIAGNOSIS — M069 Rheumatoid arthritis, unspecified: Secondary | ICD-10-CM

## 2014-08-11 DIAGNOSIS — D708 Other neutropenia: Secondary | ICD-10-CM

## 2014-08-11 DIAGNOSIS — M0500 Felty's syndrome, unspecified site: Secondary | ICD-10-CM

## 2014-08-11 DIAGNOSIS — D704 Cyclic neutropenia: Principal | ICD-10-CM

## 2014-08-11 DIAGNOSIS — Z5112 Encounter for antineoplastic immunotherapy: Secondary | ICD-10-CM

## 2014-08-11 LAB — COMPREHENSIVE METABOLIC PANEL (CC13)
ALT: 16 U/L (ref 0–55)
AST: 14 U/L (ref 5–34)
Albumin: 4 g/dL (ref 3.5–5.0)
Alkaline Phosphatase: 51 U/L (ref 40–150)
Anion Gap: 13 mEq/L — ABNORMAL HIGH (ref 3–11)
BILIRUBIN TOTAL: 0.66 mg/dL (ref 0.20–1.20)
BUN: 19.7 mg/dL (ref 7.0–26.0)
CHLORIDE: 103 meq/L (ref 98–109)
CO2: 25 mEq/L (ref 22–29)
CREATININE: 0.8 mg/dL (ref 0.6–1.1)
Calcium: 9.5 mg/dL (ref 8.4–10.4)
EGFR: 83 mL/min/{1.73_m2} — ABNORMAL LOW (ref >90)
Glucose: 114 mg/dl (ref 70–140)
POTASSIUM: 3.9 meq/L (ref 3.5–5.1)
Sodium: 141 mEq/L (ref 136–145)
Total Protein: 7.4 g/dL (ref 6.4–8.3)

## 2014-08-11 LAB — CBC WITH DIFFERENTIAL/PLATELET
BASO%: 0.4 % (ref 0.0–2.0)
Basophils Absolute: 0 10*3/uL (ref 0.0–0.1)
EOS ABS: 0 10*3/uL (ref 0.0–0.5)
EOS%: 0.3 % (ref 0.0–7.0)
HCT: 45.1 % (ref 34.8–46.6)
HGB: 14.8 g/dL (ref 11.6–15.9)
LYMPH#: 0.7 10*3/uL — AB (ref 0.9–3.3)
LYMPH%: 10.9 % — ABNORMAL LOW (ref 14.0–49.7)
MCH: 30 pg (ref 25.1–34.0)
MCHC: 32.8 g/dL (ref 31.5–36.0)
MCV: 91.4 fL (ref 79.5–101.0)
MONO#: 0.6 10*3/uL (ref 0.1–0.9)
MONO%: 8.8 % (ref 0.0–14.0)
NEUT%: 79.6 % — ABNORMAL HIGH (ref 38.4–76.8)
NEUTROS ABS: 5.1 10*3/uL (ref 1.5–6.5)
Platelets: 194 10*3/uL (ref 145–400)
RBC: 4.93 10*6/uL (ref 3.70–5.45)
RDW: 14.4 % (ref 11.2–14.5)
WBC: 6.3 10*3/uL (ref 3.9–10.3)

## 2014-08-11 MED ORDER — SODIUM CHLORIDE 0.9 % IV SOLN
Freq: Once | INTRAVENOUS | Status: AC
  Administered 2014-08-11: 13:00:00 via INTRAVENOUS

## 2014-08-11 MED ORDER — ACETAMINOPHEN 325 MG PO TABS
650.0000 mg | ORAL_TABLET | Freq: Once | ORAL | Status: AC
  Administered 2014-08-11: 650 mg via ORAL

## 2014-08-11 MED ORDER — DIPHENHYDRAMINE HCL 25 MG PO CAPS
50.0000 mg | ORAL_CAPSULE | Freq: Once | ORAL | Status: AC
  Administered 2014-08-11: 50 mg via ORAL

## 2014-08-11 MED ORDER — ACETAMINOPHEN 325 MG PO TABS
ORAL_TABLET | ORAL | Status: AC
  Filled 2014-08-11: qty 2

## 2014-08-11 MED ORDER — RITUXIMAB CHEMO INJECTION 500 MG/50ML
375.0000 mg/m2 | Freq: Once | INTRAVENOUS | Status: AC
  Administered 2014-08-11: 700 mg via INTRAVENOUS
  Filled 2014-08-11: qty 70

## 2014-08-11 MED ORDER — DIPHENHYDRAMINE HCL 25 MG PO CAPS
ORAL_CAPSULE | ORAL | Status: AC
  Filled 2014-08-11: qty 2

## 2014-08-11 NOTE — Patient Instructions (Signed)
[facility] Discharge Instructions for Patients Receiving Chemotherapy  Today you received the following chemotherapy agents Rituxan.  To help prevent nausea and vomiting after your treatment, we encourage you to take your nausea medication as prescribed.   If you develop nausea and vomiting that is not controlled by your nausea medication, call the clinic.   BELOW ARE SYMPTOMS THAT SHOULD BE REPORTED IMMEDIATELY:  *FEVER GREATER THAN 100.5 F  *CHILLS WITH OR WITHOUT FEVER  NAUSEA AND VOMITING THAT IS NOT CONTROLLED WITH YOUR NAUSEA MEDICATION  *UNUSUAL SHORTNESS OF BREATH  *UNUSUAL BRUISING OR BLEEDING  TENDERNESS IN MOUTH AND THROAT WITH OR WITHOUT PRESENCE OF ULCERS  *URINARY PROBLEMS  *BOWEL PROBLEMS  UNUSUAL RASH Items with * indicate a potential emergency and should be followed up as soon as possible.  Feel free to call the clinic you have any questions or concerns. The clinic phone number is [phone].    

## 2014-08-11 NOTE — Assessment & Plan Note (Signed)
She will get GCSF injection whenever ANC <1000. She is not symptomatic

## 2014-08-11 NOTE — Assessment & Plan Note (Signed)
This is stable recently. I recommend prednisone taper to 7.5 mg daily. She have received Hydrocortisone shot and Depo-Medrol recently.

## 2014-08-11 NOTE — Progress Notes (Signed)
[place] OFFICE PROGRESS NOTE  Patient Care Team: Thressa Sheller, MD as PCP - General (Internal Medicine) Hennie Duos, MD as Consulting Physician (Rheumatology) Annia Belt, MD as Consulting Physician (Oncology)  SUMMARY OF ONCOLOGIC HISTORY:  This patient had been followed here for many years. 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 Bone marrow aspirate and biopsy was performed in 2002 and 2004 with no abnormalities found. The patient was treated with G-CSF with good response. She was subsequently started on methotrexate weekly along with prednisone.  On 09/09/2013, methotrexate was discontinued due to severe neutropenia. She was started on G-CSF injection daily. From 10/14/2013 to 11/04/13, she was started on weekly rituximab. In October 2015, her neutropenia has relapsed. From 04/07/2014 to 04/28/14, she received rituximab again. From 05/31/14, Rituximab is restarted  INTERVAL HISTORY: Please see below for problem oriented charting. She is doing well up from recent arthritis pain. She received hydrocortisone shot and Depo-Medrol. Recently, she had upper respiratory tract infection that resolved with azithromycin antibiotics.  REVIEW OF SYSTEMS:   Constitutional: Denies fevers, chills or abnormal weight loss Eyes: Denies blurriness of vision Ears, nose, mouth, throat, and face: Denies mucositis or sore throat Respiratory: Denies cough, dyspnea or wheezes Cardiovascular: Denies palpitation, chest discomfort or lower extremity swelling Gastrointestinal:  Denies nausea, heartburn or change in bowel habits Skin: Denies abnormal skin rashes Lymphatics: Denies new lymphadenopathy or easy bruising Neurological:Denies numbness, tingling or new weaknesses Behavioral/Psych: Mood is stable, no new changes  All other systems were reviewed with the patient and are negative.  I have reviewed the past medical  history, past surgical history, social history and family history with the patient and they are unchanged from previous note.  ALLERGIES:  is allergic to penicillins.  MEDICATIONS:  Current Outpatient Prescriptions  Medication Sig Dispense Refill  . Alum & Mag Hydroxide-Simeth (MAGIC MOUTHWASH W/LIDOCAINE) SOLN Take 5 mLs by mouth 3 (three) times daily as needed. 240 mL 0  . Ascorbic Acid (VITAMIN C) 1000 MG tablet Take 1,000 mg by mouth daily.    . calcium carbonate (CALCIUM 600) 600 MG TABS Take 600 mg by mouth 2 (two) times daily with a meal.      . Cholecalciferol (VITAMIN D) 1000 UNITS capsule Take 1,000 Units by mouth daily.     . cyclobenzaprine (FLEXERIL) 10 MG tablet Take 1 tablet (10 mg total) by mouth 3 (three) times daily as needed for muscle spasms. 90 tablet 2  . diclofenac sodium (VOLTAREN) 1 % GEL Apply 2 g topically 4 (four) times daily. 3 Tube 2  . DULoxetine (CYMBALTA) 30 MG capsule Take 30 mg by mouth 2 (two) times daily.     Marland Kitchen HYDROcodone-acetaminophen (NORCO) 7.5-325 MG per tablet Take 1 tablet by mouth every 4 (four) hours as needed. Max 6 per day 180 tablet 0  . Melatonin 10 MG CAPS Take 5 mg by mouth at bedtime.     . methotrexate (RHEUMATREX) 10 MG tablet Take 1 tablet (10 mg total) by mouth once a week. Caution: Chemotherapy. Protect from light. 4 tablet 3  . nortriptyline (PAMELOR) 50 MG capsule TAKE 2 CAPSULES BY MOUTH AT BEDTIME 180 capsule 0  . omeprazole (PRILOSEC) 20 MG capsule Take 20 mg by mouth daily.     . predniSONE (DELTASONE) 10 MG tablet TAKE DAILY BY MOUTH AS DIRECTED BY PHYSICIAN. 60 tablet 3  . predniSONE (DELTASONE) 5 MG tablet Take 1.5 tablets (7.5 mg total) by  mouth daily with breakfast. 90 tablet 3  . pregabalin (LYRICA) 200 MG capsule TAKE 1 CAPSULE BY MOUTH TWICE DAILY  *90 day supply* 180 capsule 0  . Probiotic Product (PROBIOTIC FORMULA) CAPS Take 1 capsule by mouth daily.      . vitamin B-12 (CYANOCOBALAMIN) 1000 MCG tablet Take 1,000 mcg  by mouth every other day.     . zinc gluconate 50 MG tablet Take 100 mg by mouth daily.    . [DISCONTINUED] POTASSIUM PO Take 20 mEq by mouth 2 (two) times daily.     No current facility-administered medications for this visit.    PHYSICAL EXAMINATION: ECOG PERFORMANCE STATUS: 0 - Asymptomatic  Filed Vitals:   08/11/14 1117  BP: 113/71  Pulse: 93  Temp: 98.5 F (36.9 C)  Resp: 18   Filed Weights   08/11/14 1117  Weight: 180 lb 12.8 oz (82.01 kg)    GENERAL:alert, no distress and comfortable SKIN: skin color, texture, turgor are normal, no rashes or significant lesions EYES: normal, Conjunctiva are pink and non-injected, sclera clear OROPHARYNX:no exudate, no erythema and lips, buccal mucosa, and tongue normal  NECK: supple, thyroid normal size, non-tender, without nodularity LYMPH:  no palpable lymphadenopathy in the cervical, axillary or inguinal LUNGS: clear to auscultation and percussion with normal breathing effort HEART: regular rate & rhythm and no murmurs and no lower extremity edema ABDOMEN:abdomen soft, non-tender and normal bowel sounds Musculoskeletal:no cyanosis of digits and no clubbing . Noted joint changes consistent with rheumatoid arthritis NEURO: alert & oriented x 3 with fluent speech, no focal motor/sensory deficits  LABORATORY DATA:  I have reviewed the data as listed    Component Value Date/Time   NA 141 08/11/2014 1105   NA 140 10/16/2011 1338   K 3.9 08/11/2014 1105   K 3.8 10/16/2011 1338   CL 99 10/19/2012 1051   CL 105 10/16/2011 1338   CO2 25 08/11/2014 1105   CO2 26 10/16/2011 1338   GLUCOSE 114 08/11/2014 1105   GLUCOSE 143* 10/19/2012 1051   GLUCOSE 140* 10/16/2011 1338   BUN 19.7 08/11/2014 1105   BUN 10 10/16/2011 1338   CREATININE 0.8 08/11/2014 1105   CREATININE 0.82 10/16/2011 1338   CALCIUM 9.5 08/11/2014 1105   CALCIUM 9.3 10/16/2011 1338   PROT 7.4 08/11/2014 1105   PROT 7.1 10/16/2011 1338   ALBUMIN 4.0 08/11/2014 1105    ALBUMIN 4.0 10/16/2011 1338   AST 14 08/11/2014 1105   AST 17 10/16/2011 1338   ALT 16 08/11/2014 1105   ALT 19 10/16/2011 1338   ALKPHOS 51 08/11/2014 1105   ALKPHOS 44 10/16/2011 1338   BILITOT 0.66 08/11/2014 1105   BILITOT 0.5 10/16/2011 1338   GFRNONAA >60 03/01/2011 1112   GFRAA >60 03/01/2011 1112    No results found for: SPEP, UPEP  Lab Results  Component Value Date   WBC 6.3 08/11/2014   NEUTROABS 5.1 08/11/2014   HGB 14.8 08/11/2014   HCT 45.1 08/11/2014   MCV 91.4 08/11/2014   PLT 194 08/11/2014      Chemistry      Component Value Date/Time   NA 141 08/11/2014 1105   NA 140 10/16/2011 1338   K 3.9 08/11/2014 1105   K 3.8 10/16/2011 1338   CL 99 10/19/2012 1051   CL 105 10/16/2011 1338   CO2 25 08/11/2014 1105   CO2 26 10/16/2011 1338   BUN 19.7 08/11/2014 1105   BUN 10 10/16/2011 1338   CREATININE 0.8  08/11/2014 1105   CREATININE 0.82 10/16/2011 1338      Component Value Date/Time   CALCIUM 9.5 08/11/2014 1105   CALCIUM 9.3 10/16/2011 1338   ALKPHOS 51 08/11/2014 1105   ALKPHOS 44 10/16/2011 1338   AST 14 08/11/2014 1105   AST 17 10/16/2011 1338   ALT 16 08/11/2014 1105   ALT 19 10/16/2011 1338   BILITOT 0.66 08/11/2014 1105   BILITOT 0.5 10/16/2011 1338     ASSESSMENT & PLAN:  Felty's syndrome She had received 4 doses of weekly rituximab completed in May 2015. She had several months of blood count but then her leukopenia has relapsed with prednisone taper.  We discussed the risks, benefit, side effects of repeat treatment with rituximab and she agreed to proceed. Treatment was restarted again on December with good response, but relapsed within 1 month of discontinuation. I recommend repeat treatment again today and to go on maintenance rituxan monthly afterwards In the meantime, I would recommend tapering her prednisone treatments to 7.5 mg daily    Neutropenia associated with autoimmune disease She will get GCSF injection whenever ANC  <1000. She is not symptomatic   Rheumatoid arthritis This is stable recently. I recommend prednisone taper to 7.5 mg daily. She have received Hydrocortisone shot and Depo-Medrol recently.        No orders of the defined types were placed in this encounter.   All questions were answered. The patient knows to call the clinic with any problems, questions or concerns. No barriers to learning was detected. I spent 25 minutes counseling the patient face to face. The total time spent in the appointment was 30 minutes and more than 50% was on counseling and review of test results     [facility], [place], MD 08/11/2014 11:41 AM

## 2014-08-11 NOTE — Assessment & Plan Note (Signed)
She had received 4 doses of weekly rituximab completed in May 2015. She had several months of blood count but then her leukopenia has relapsed with prednisone taper.  We discussed the risks, benefit, side effects of repeat treatment with rituximab and she agreed to proceed. Treatment was restarted again on December with good response, but relapsed within 1 month of discontinuation. I recommend repeat treatment again today and to go on maintenance rituxan monthly afterwards In the meantime, I would recommend tapering her prednisone treatments to 7.5 mg daily

## 2014-08-11 NOTE — Telephone Encounter (Signed)
gv and printed appt sched and avs forpt for April....sed added tx. °

## 2014-09-08 ENCOUNTER — Other Ambulatory Visit (HOSPITAL_BASED_OUTPATIENT_CLINIC_OR_DEPARTMENT_OTHER): Payer: Commercial Managed Care - HMO

## 2014-09-08 ENCOUNTER — Telehealth: Payer: Self-pay | Admitting: Hematology and Oncology

## 2014-09-08 ENCOUNTER — Ambulatory Visit (HOSPITAL_BASED_OUTPATIENT_CLINIC_OR_DEPARTMENT_OTHER): Payer: Commercial Managed Care - HMO | Admitting: Hematology and Oncology

## 2014-09-08 ENCOUNTER — Encounter: Payer: Self-pay | Admitting: Hematology and Oncology

## 2014-09-08 ENCOUNTER — Telehealth: Payer: Self-pay | Admitting: *Deleted

## 2014-09-08 ENCOUNTER — Ambulatory Visit (HOSPITAL_BASED_OUTPATIENT_CLINIC_OR_DEPARTMENT_OTHER): Payer: Commercial Managed Care - HMO

## 2014-09-08 VITALS — BP 109/63 | HR 89 | Temp 98.2°F | Resp 18 | Ht 62.0 in | Wt 185.1 lb

## 2014-09-08 DIAGNOSIS — M359 Systemic involvement of connective tissue, unspecified: Secondary | ICD-10-CM

## 2014-09-08 DIAGNOSIS — Z5112 Encounter for antineoplastic immunotherapy: Secondary | ICD-10-CM | POA: Diagnosis not present

## 2014-09-08 DIAGNOSIS — M0500 Felty's syndrome, unspecified site: Secondary | ICD-10-CM

## 2014-09-08 DIAGNOSIS — M069 Rheumatoid arthritis, unspecified: Secondary | ICD-10-CM

## 2014-09-08 DIAGNOSIS — D708 Other neutropenia: Secondary | ICD-10-CM

## 2014-09-08 DIAGNOSIS — D704 Cyclic neutropenia: Principal | ICD-10-CM

## 2014-09-08 LAB — CBC WITH DIFFERENTIAL/PLATELET
BASO%: 0.8 % (ref 0.0–2.0)
Basophils Absolute: 0 10*3/uL (ref 0.0–0.1)
EOS ABS: 0 10*3/uL (ref 0.0–0.5)
EOS%: 0 % (ref 0.0–7.0)
HEMATOCRIT: 39.1 % (ref 34.8–46.6)
HGB: 13.2 g/dL (ref 11.6–15.9)
LYMPH%: 33.1 % (ref 14.0–49.7)
MCH: 30.2 pg (ref 25.1–34.0)
MCHC: 33.7 g/dL (ref 31.5–36.0)
MCV: 89.7 fL (ref 79.5–101.0)
MONO#: 0.4 10*3/uL (ref 0.1–0.9)
MONO%: 17.6 % — AB (ref 0.0–14.0)
NEUT#: 1.1 10*3/uL — ABNORMAL LOW (ref 1.5–6.5)
NEUT%: 48.5 % (ref 38.4–76.8)
PLATELETS: 181 10*3/uL (ref 145–400)
RBC: 4.36 10*6/uL (ref 3.70–5.45)
RDW: 14.5 % (ref 11.2–14.5)
WBC: 2.2 10*3/uL — ABNORMAL LOW (ref 3.9–10.3)
lymph#: 0.7 10*3/uL — ABNORMAL LOW (ref 0.9–3.3)

## 2014-09-08 LAB — COMPREHENSIVE METABOLIC PANEL (CC13)
ALBUMIN: 3.5 g/dL (ref 3.5–5.0)
ALT: 18 U/L (ref 0–55)
AST: 15 U/L (ref 5–34)
Alkaline Phosphatase: 52 U/L (ref 40–150)
Anion Gap: 13 mEq/L — ABNORMAL HIGH (ref 3–11)
BUN: 9 mg/dL (ref 7.0–26.0)
CALCIUM: 9.3 mg/dL (ref 8.4–10.4)
CHLORIDE: 103 meq/L (ref 98–109)
CO2: 26 mEq/L (ref 22–29)
Creatinine: 0.8 mg/dL (ref 0.6–1.1)
EGFR: 81 mL/min/{1.73_m2} — ABNORMAL LOW (ref >90)
Glucose: 208 mg/dl — ABNORMAL HIGH (ref 70–140)
Potassium: 3.6 mEq/L (ref 3.5–5.1)
Sodium: 141 mEq/L (ref 136–145)
Total Bilirubin: 0.58 mg/dL (ref 0.20–1.20)
Total Protein: 6.8 g/dL (ref 6.4–8.3)

## 2014-09-08 MED ORDER — SODIUM CHLORIDE 0.9 % IV SOLN
Freq: Once | INTRAVENOUS | Status: AC
  Administered 2014-09-08: 12:00:00 via INTRAVENOUS

## 2014-09-08 MED ORDER — ACETAMINOPHEN 325 MG PO TABS
ORAL_TABLET | ORAL | Status: AC
  Filled 2014-09-08: qty 2

## 2014-09-08 MED ORDER — ACETAMINOPHEN 325 MG PO TABS
650.0000 mg | ORAL_TABLET | Freq: Once | ORAL | Status: AC
  Administered 2014-09-08: 650 mg via ORAL

## 2014-09-08 MED ORDER — DIPHENHYDRAMINE HCL 25 MG PO CAPS
50.0000 mg | ORAL_CAPSULE | Freq: Once | ORAL | Status: AC
  Administered 2014-09-08: 50 mg via ORAL

## 2014-09-08 MED ORDER — DIPHENHYDRAMINE HCL 25 MG PO CAPS
ORAL_CAPSULE | ORAL | Status: AC
  Filled 2014-09-08: qty 2

## 2014-09-08 MED ORDER — SODIUM CHLORIDE 0.9 % IV SOLN
375.0000 mg/m2 | Freq: Once | INTRAVENOUS | Status: AC
  Administered 2014-09-08: 700 mg via INTRAVENOUS
  Filled 2014-09-08: qty 70

## 2014-09-08 NOTE — Patient Instructions (Signed)
[facility] Discharge Instructions for Patients Receiving Chemotherapy  Today you received the following chemotherapy agents: Ritxuan  To help prevent nausea and vomiting after your treatment, we encourage you to take your nausea medication as prescribed by your physician.   If you develop nausea and vomiting that is not controlled by your nausea medication, call the clinic.   BELOW ARE SYMPTOMS THAT SHOULD BE REPORTED IMMEDIATELY:  *FEVER GREATER THAN 100.5 F  *CHILLS WITH OR WITHOUT FEVER  NAUSEA AND VOMITING THAT IS NOT CONTROLLED WITH YOUR NAUSEA MEDICATION  *UNUSUAL SHORTNESS OF BREATH  *UNUSUAL BRUISING OR BLEEDING  TENDERNESS IN MOUTH AND THROAT WITH OR WITHOUT PRESENCE OF ULCERS  *URINARY PROBLEMS  *BOWEL PROBLEMS  UNUSUAL RASH Items with * indicate a potential emergency and should be followed up as soon as possible.  Feel free to call the clinic you have any questions or concerns. The clinic phone number is [phone].  Please show the [place] at check-in to the Emergency Department and triage nurse.

## 2014-09-08 NOTE — Telephone Encounter (Signed)
Gave avs & calendar for April. Sent message to schedule treatment. °

## 2014-09-08 NOTE — Telephone Encounter (Signed)
Per staff message and POF I have scheduled appts. Advised scheduler of appts. JMW  

## 2014-09-09 NOTE — Assessment & Plan Note (Signed)
She had received 4 doses of weekly rituximab completed in May 2015. She had several months of blood count but then her leukopenia has relapsed with prednisone taper.  We discussed the risks, benefit, side effects of repeat treatment with rituximab and she agreed to proceed. Treatment was restarted again on December with good response, but relapsed within 1 month of discontinuation. I recommend repeat treatment again today and to go on maintenance rituxan monthly afterwards In the meantime, I would recommend tapering her prednisone treatments to 7.5 mg alternate with 5 mg daily

## 2014-09-09 NOTE — Progress Notes (Signed)
[place], [place] SUMMARY OF HEMATOLOGIC HISTORY:  This patient had been followed here for many years. 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 Bone marrow aspirate and biopsy was performed in 2002 and 2004 with no abnormalities found. The patient was treated with G-CSF with good response. She was subsequently started on methotrexate weekly along with prednisone.  On 09/09/2013, methotrexate was discontinued due to severe neutropenia. She was started on G-CSF injection daily. From 10/14/2013 to 11/04/13, she was started on weekly rituximab. In October 2015, her neutropenia has relapsed. From 04/07/2014 to 04/28/14, she received rituximab again. From 05/31/14, Rituximab is restarted  INTERVAL HISTORY: [name] 64 y.o. female returns for further follow-up She denies recent infection. No flare in joint pain  I have reviewed the past medical history, past surgical history, social history and family history with the patient and they are unchanged from previous note.  ALLERGIES:  is allergic to penicillins.  MEDICATIONS:  Current Outpatient Prescriptions  Medication Sig Dispense Refill  . Alum & Mag Hydroxide-Simeth (MAGIC MOUTHWASH W/LIDOCAINE) SOLN Take 5 mLs by mouth 3 (three) times daily as needed. 240 mL 0  . Ascorbic Acid (VITAMIN C) 1000 MG tablet Take 1,000 mg by mouth daily.    . calcium carbonate (CALCIUM 600) 600 MG TABS Take 600 mg by mouth 2 (two) times daily with a meal.      . Cholecalciferol (VITAMIN D) 1000 UNITS capsule Take 1,000 Units by mouth daily.     . cyclobenzaprine (FLEXERIL) 10 MG tablet Take 1 tablet (10 mg total) by mouth 3 (three) times daily as needed for muscle spasms. 90 tablet 2  . diclofenac sodium (VOLTAREN) 1 % GEL Apply 2 g topically 4 (four) times daily. 3 Tube 2  . DULoxetine (CYMBALTA) 30 MG capsule Take 30 mg by mouth 2 (two) times  daily.     Marland Kitchen HYDROcodone-acetaminophen (NORCO) 7.5-325 MG per tablet Take 1 tablet by mouth every 4 (four) hours as needed. Max 6 per day 180 tablet 0  . Melatonin 10 MG CAPS Take 5 mg by mouth at bedtime.     . methotrexate (RHEUMATREX) 10 MG tablet Take 1 tablet (10 mg total) by mouth once a week. Caution: Chemotherapy. Protect from light. 4 tablet 3  . nortriptyline (PAMELOR) 50 MG capsule TAKE 2 CAPSULES BY MOUTH AT BEDTIME 180 capsule 0  . omeprazole (PRILOSEC) 20 MG capsule Take 20 mg by mouth daily.     . predniSONE (DELTASONE) 10 MG tablet TAKE DAILY BY MOUTH AS DIRECTED BY PHYSICIAN. 60 tablet 3  . predniSONE (DELTASONE) 5 MG tablet Take 1.5 tablets (7.5 mg total) by mouth daily with breakfast. 90 tablet 3  . pregabalin (LYRICA) 200 MG capsule TAKE 1 CAPSULE BY MOUTH TWICE DAILY  *90 day supply* 180 capsule 0  . Probiotic Product (PROBIOTIC FORMULA) CAPS Take 1 capsule by mouth daily.      . vitamin B-12 (CYANOCOBALAMIN) 1000 MCG tablet Take 1,000 mcg by mouth every other day.     . zinc gluconate 50 MG tablet Take 100 mg by mouth daily.    . [DISCONTINUED] POTASSIUM PO Take 20 mEq by mouth 2 (two) times daily.     No current facility-administered medications for this visit.     REVIEW OF SYSTEMS:   Constitutional: Denies fevers, chills or night sweats Eyes: Denies blurriness of vision Ears, nose, mouth, throat, and face: Denies mucositis or  sore throat Respiratory: Denies cough, dyspnea or wheezes Cardiovascular: Denies palpitation, chest discomfort or lower extremity swelling Gastrointestinal:  Denies nausea, heartburn or change in bowel habits Skin: Denies abnormal skin rashes Lymphatics: Denies new lymphadenopathy or easy bruising Neurological:Denies numbness, tingling or new weaknesses Behavioral/Psych: Mood is stable, no new changes  All other systems were reviewed with the patient and are negative.  PHYSICAL EXAMINATION: ECOG PERFORMANCE STATUS: 0 -  Asymptomatic  Filed Vitals:   09/08/14 1130  BP: 109/63  Pulse: 89  Temp: 98.2 F (36.8 C)  Resp: 18   Filed Weights   09/08/14 1130  Weight: 185 lb 1.6 oz (83.961 kg)    GENERAL:alert, no distress and comfortable SKIN: skin color, texture, turgor are normal, no rashes or significant lesions EYES: normal, Conjunctiva are pink and non-injected, sclera clear OROPHARYNX:no exudate, no erythema and lips, buccal mucosa, and tongue normal  NECK: supple, thyroid normal size, non-tender, without nodularity LYMPH:  no palpable lymphadenopathy in the cervical, axillary or inguinal LUNGS: clear to auscultation and percussion with normal breathing effort HEART: regular rate & rhythm and no murmurs and no lower extremity edema ABDOMEN:abdomen soft, non-tender and normal bowel sounds Musculoskeletal:no cyanosis of digits and no clubbing  NEURO: alert & oriented x 3 with fluent speech, no focal motor/sensory deficits  LABORATORY DATA:  I have reviewed the data as listed Results for orders placed or performed in visit on 09/08/14 (from the past 48 hour(s))  Comprehensive metabolic panel     Status: Abnormal   Collection Time: 09/08/14 10:35 AM  Result Value Ref Range   Sodium 141 136 - 145 mEq/L   Potassium 3.6 3.5 - 5.1 mEq/L   Chloride 103 98 - 109 mEq/L   CO2 26 22 - 29 mEq/L   Glucose 208 (H) 70 - 140 mg/dl   BUN 9.0 7.0 - 26.0 mg/dL   Creatinine 0.8 0.6 - 1.1 mg/dL   Total Bilirubin 0.58 0.20 - 1.20 mg/dL   Alkaline Phosphatase 52 40 - 150 U/L   AST 15 5 - 34 U/L   ALT 18 0 - 55 U/L   Total Protein 6.8 6.4 - 8.3 g/dL   Albumin 3.5 3.5 - 5.0 g/dL   Calcium 9.3 8.4 - 10.4 mg/dL   Anion Gap 13 (H) 3 - 11 mEq/L   EGFR 81 (L) >90 ml/min/1.73 m2    Comment: eGFR is calculated using the CKD-EPI Creatinine Equation (2009)  CBC with Differential/Platelet     Status: Abnormal   Collection Time: 09/08/14 10:35 AM  Result Value Ref Range   WBC 2.2 (L) 3.9 - 10.3 10e3/uL   NEUT# 1.1 (L)  1.5 - 6.5 10e3/uL   HGB 13.2 11.6 - 15.9 g/dL   HCT 39.1 34.8 - 46.6 %   Platelets 181 145 - 400 10e3/uL   MCV 89.7 79.5 - 101.0 fL   MCH 30.2 25.1 - 34.0 pg   MCHC 33.7 31.5 - 36.0 g/dL   RBC 4.36 3.70 - 5.45 10e6/uL   RDW 14.5 11.2 - 14.5 %   lymph# 0.7 (L) 0.9 - 3.3 10e3/uL   MONO# 0.4 0.1 - 0.9 10e3/uL   Eosinophils Absolute 0.0 0.0 - 0.5 10e3/uL   Basophils Absolute 0.0 0.0 - 0.1 10e3/uL   NEUT% 48.5 38.4 - 76.8 %   LYMPH% 33.1 14.0 - 49.7 %   MONO% 17.6 (H) 0.0 - 14.0 %   EOS% 0.0 0.0 - 7.0 %   BASO% 0.8 0.0 - 2.0 %  Lab Results  Component Value Date   WBC 2.2* 09/08/2014   HGB 13.2 09/08/2014   HCT 39.1 09/08/2014   MCV 89.7 09/08/2014   PLT 181 09/08/2014    RADIOGRAPHIC STUDIES: I have personally reviewed the radiological images as listed and agreed with the findings in the report. No results found.  ASSESSMENT & PLAN:  Felty's syndrome She had received 4 doses of weekly rituximab completed in May 2015. She had several months of blood count but then her leukopenia has relapsed with prednisone taper.  We discussed the risks, benefit, side effects of repeat treatment with rituximab and she agreed to proceed. Treatment was restarted again on December with good response, but relapsed within 1 month of discontinuation. I recommend repeat treatment again today and to go on maintenance rituxan monthly afterwards In the meantime, I would recommend tapering her prednisone treatments to 7.5 mg alternate with 5 mg daily      Neutropenia associated with autoimmune disease She will get GCSF injection whenever ANC <1000. She is not symptomatic     Rheumatoid arthritis This is stable recently. I recommend prednisone taper to 7.5 mg alternate with 5 mg daily     All questions were answered. The patient knows to call the clinic with any problems, questions or concerns. No barriers to learning was detected.  I spent 15 minutes counseling the patient face to face.  The total time spent in the appointment was 20 minutes and more than 50% was on counseling.     [facility], Mahdi Frye, MD 4/2/20163:36 PM

## 2014-09-09 NOTE — Assessment & Plan Note (Signed)
This is stable recently. I recommend prednisone taper to 7.5 mg alternate with 5 mg daily

## 2014-09-09 NOTE — Assessment & Plan Note (Signed)
She will get GCSF injection whenever ANC <1000. She is not symptomatic

## 2014-09-23 ENCOUNTER — Other Ambulatory Visit: Payer: Self-pay | Admitting: Physical Medicine & Rehabilitation

## 2014-10-06 ENCOUNTER — Other Ambulatory Visit (HOSPITAL_BASED_OUTPATIENT_CLINIC_OR_DEPARTMENT_OTHER): Payer: Commercial Managed Care - HMO

## 2014-10-06 ENCOUNTER — Ambulatory Visit (HOSPITAL_BASED_OUTPATIENT_CLINIC_OR_DEPARTMENT_OTHER): Payer: Commercial Managed Care - HMO

## 2014-10-06 ENCOUNTER — Ambulatory Visit (HOSPITAL_BASED_OUTPATIENT_CLINIC_OR_DEPARTMENT_OTHER): Payer: Commercial Managed Care - HMO | Admitting: Hematology and Oncology

## 2014-10-06 ENCOUNTER — Encounter: Payer: Self-pay | Admitting: Hematology and Oncology

## 2014-10-06 VITALS — BP 98/63 | HR 96 | Temp 96.9°F | Resp 16

## 2014-10-06 VITALS — BP 117/75 | HR 92 | Temp 98.2°F | Resp 20 | Ht 62.0 in | Wt 178.6 lb

## 2014-10-06 DIAGNOSIS — M0500 Felty's syndrome, unspecified site: Secondary | ICD-10-CM

## 2014-10-06 DIAGNOSIS — M359 Systemic involvement of connective tissue, unspecified: Secondary | ICD-10-CM

## 2014-10-06 DIAGNOSIS — D708 Other neutropenia: Secondary | ICD-10-CM

## 2014-10-06 DIAGNOSIS — Z5112 Encounter for antineoplastic immunotherapy: Secondary | ICD-10-CM

## 2014-10-06 DIAGNOSIS — D704 Cyclic neutropenia: Secondary | ICD-10-CM

## 2014-10-06 DIAGNOSIS — M069 Rheumatoid arthritis, unspecified: Secondary | ICD-10-CM

## 2014-10-06 LAB — CBC WITH DIFFERENTIAL/PLATELET
BASO%: 0.3 % (ref 0.0–2.0)
Basophils Absolute: 0 10*3/uL (ref 0.0–0.1)
EOS%: 0 % (ref 0.0–7.0)
Eosinophils Absolute: 0 10*3/uL (ref 0.0–0.5)
HEMATOCRIT: 38 % (ref 34.8–46.6)
HGB: 13.4 g/dL (ref 11.6–15.9)
LYMPH#: 0.7 10*3/uL — AB (ref 0.9–3.3)
LYMPH%: 12.5 % — ABNORMAL LOW (ref 14.0–49.7)
MCH: 31.7 pg (ref 25.1–34.0)
MCHC: 35.3 g/dL (ref 31.5–36.0)
MCV: 89.8 fL (ref 79.5–101.0)
MONO#: 0.5 10*3/uL (ref 0.1–0.9)
MONO%: 9.4 % (ref 0.0–14.0)
NEUT#: 4.5 10*3/uL (ref 1.5–6.5)
NEUT%: 77.8 % — AB (ref 38.4–76.8)
PLATELETS: 180 10*3/uL (ref 145–400)
RBC: 4.23 10*6/uL (ref 3.70–5.45)
RDW: 14.2 % (ref 11.2–14.5)
WBC: 5.8 10*3/uL (ref 3.9–10.3)

## 2014-10-06 LAB — COMPREHENSIVE METABOLIC PANEL (CC13)
ALT: 20 U/L (ref 0–55)
ANION GAP: 12 meq/L — AB (ref 3–11)
AST: 22 U/L (ref 5–34)
Albumin: 4.1 g/dL (ref 3.5–5.0)
Alkaline Phosphatase: 50 U/L (ref 40–150)
BUN: 13 mg/dL (ref 7.0–26.0)
CO2: 24 meq/L (ref 22–29)
Calcium: 9.2 mg/dL (ref 8.4–10.4)
Chloride: 103 mEq/L (ref 98–109)
Creatinine: 0.7 mg/dL (ref 0.6–1.1)
EGFR: 88 mL/min/{1.73_m2} — ABNORMAL LOW (ref >90)
Glucose: 114 mg/dl (ref 70–140)
Potassium: 3.6 mEq/L (ref 3.5–5.1)
Sodium: 139 mEq/L (ref 136–145)
Total Bilirubin: 0.57 mg/dL (ref 0.20–1.20)
Total Protein: 7.2 g/dL (ref 6.4–8.3)

## 2014-10-06 MED ORDER — ACETAMINOPHEN 325 MG PO TABS
650.0000 mg | ORAL_TABLET | Freq: Once | ORAL | Status: AC
  Administered 2014-10-06: 650 mg via ORAL

## 2014-10-06 MED ORDER — SODIUM CHLORIDE 0.9 % IV SOLN
Freq: Once | INTRAVENOUS | Status: AC
  Administered 2014-10-06: 11:00:00 via INTRAVENOUS

## 2014-10-06 MED ORDER — DIPHENHYDRAMINE HCL 25 MG PO CAPS
50.0000 mg | ORAL_CAPSULE | Freq: Once | ORAL | Status: AC
  Administered 2014-10-06: 50 mg via ORAL

## 2014-10-06 MED ORDER — DIPHENHYDRAMINE HCL 25 MG PO CAPS
ORAL_CAPSULE | ORAL | Status: AC
  Filled 2014-10-06: qty 2

## 2014-10-06 MED ORDER — ACETAMINOPHEN 325 MG PO TABS
ORAL_TABLET | ORAL | Status: AC
  Filled 2014-10-06: qty 2

## 2014-10-06 MED ORDER — RITUXIMAB CHEMO INJECTION 500 MG/50ML
375.0000 mg/m2 | Freq: Once | INTRAVENOUS | Status: AC
  Administered 2014-10-06: 700 mg via INTRAVENOUS
  Filled 2014-10-06: qty 70

## 2014-10-06 NOTE — Assessment & Plan Note (Signed)
She is doing well with monthly rituximab therapy and that is able to control her arthritis and her neutropenia. In the meantime, I would recommend tapering her prednisone to 5 mg daily

## 2014-10-06 NOTE — Progress Notes (Signed)
Boody, MD SUMMARY OF HEMATOLOGIC HISTORY:  This patient had been followed here for many years. 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 Bone marrow aspirate and biopsy was performed in 2002 and 2004 with no abnormalities found. The patient was treated with G-CSF with good response. She was subsequently started on methotrexate weekly along with prednisone.  On 09/09/2013, methotrexate was discontinued due to severe neutropenia. She was started on G-CSF injection daily. From 10/14/2013 to 11/04/13, she was started on weekly rituximab. In October 2015, her neutropenia has relapsed. From 04/07/2014 to 04/28/14, she received rituximab again. From 05/31/14, Rituximab is restarted and is given on a monthly basis  INTERVAL HISTORY: [name] 64 y.o. female returns for further follow-up. She feels well. She did not need to start methotrexate for neutropenia. Her arthritis does not bother her. She is currently taking prednisone 7.5 mg alternate with 5 mg.   I have reviewed the past medical history, past surgical history, social history and family history with the patient and they are unchanged from previous note.  ALLERGIES:  is allergic to penicillins.  MEDICATIONS:  Current Outpatient Prescriptions  Medication Sig Dispense Refill  . Alum & Mag Hydroxide-Simeth (MAGIC MOUTHWASH W/LIDOCAINE) SOLN Take 5 mLs by mouth 3 (three) times daily as needed. 240 mL 0  . Ascorbic Acid (VITAMIN C) 1000 MG tablet Take 1,000 mg by mouth daily.    . calcium carbonate (CALCIUM 600) 600 MG TABS Take 600 mg by mouth 2 (two) times daily with a meal.      . Cholecalciferol (VITAMIN D) 1000 UNITS capsule Take 1,000 Units by mouth daily.     . cyclobenzaprine (FLEXERIL) 10 MG tablet Take 1 tablet (10 mg total) by mouth 3 (three) times daily as needed for muscle spasms. 90 tablet 2  . diclofenac  sodium (VOLTAREN) 1 % GEL Apply 2 g topically 4 (four) times daily. 3 Tube 2  . DULoxetine (CYMBALTA) 30 MG capsule Take 30 mg by mouth 2 (two) times daily.     Marland Kitchen HYDROcodone-acetaminophen (NORCO) 7.5-325 MG per tablet Take 1 tablet by mouth every 4 (four) hours as needed. Max 6 per day 180 tablet 0  . Melatonin 10 MG CAPS Take 5 mg by mouth at bedtime.     . nortriptyline (PAMELOR) 50 MG capsule TAKE 2 CAPSULES BY MOUTH AT BEDTIME 180 capsule 0  . omeprazole (PRILOSEC) 20 MG capsule Take 20 mg by mouth daily.     . predniSONE (DELTASONE) 10 MG tablet TAKE DAILY BY MOUTH AS DIRECTED BY PHYSICIAN. 60 tablet 3  . predniSONE (DELTASONE) 5 MG tablet Take 1.5 tablets (7.5 mg total) by mouth daily with breakfast. 90 tablet 3  . pregabalin (LYRICA) 200 MG capsule TAKE 1 CAPSULE BY MOUTH TWICE DAILY  *90 day supply* 180 capsule 0  . Probiotic Product (PROBIOTIC FORMULA) CAPS Take 1 capsule by mouth daily.      . vitamin B-12 (CYANOCOBALAMIN) 1000 MCG tablet Take 1,000 mcg by mouth every other day.     . zinc gluconate 50 MG tablet Take 100 mg by mouth daily.    . methotrexate (RHEUMATREX) 10 MG tablet Take 1 tablet (10 mg total) by mouth once a week. Caution: Chemotherapy. Protect from light. (Patient not taking: Reported on 10/06/2014) 4 tablet 3  . [DISCONTINUED] POTASSIUM PO Take 20 mEq by mouth 2 (two) times daily.     No current facility-administered  medications for this visit.     REVIEW OF SYSTEMS:   Constitutional: Denies fevers, chills or night sweats Eyes: Denies blurriness of vision Ears, nose, mouth, throat, and face: Denies mucositis or sore throat Respiratory: Denies cough, dyspnea or wheezes Cardiovascular: Denies palpitation, chest discomfort or lower extremity swelling Gastrointestinal:  Denies nausea, heartburn or change in bowel habits Skin: Denies abnormal skin rashes Lymphatics: Denies new lymphadenopathy or easy bruising Neurological:Denies numbness, tingling or new  weaknesses Behavioral/Psych: Mood is stable, no new changes  All other systems were reviewed with the patient and are negative.  PHYSICAL EXAMINATION: ECOG PERFORMANCE STATUS: 0 - Asymptomatic  Filed Vitals:   10/06/14 1027  BP: 117/75  Pulse: 92  Temp: 98.2 F (36.8 C)  Resp: 20   Filed Weights   10/06/14 1027  Weight: 178 lb 9.6 oz (81.012 kg)    GENERAL:alert, no distress and comfortable SKIN: skin color, texture, turgor are normal, no rashes or significant lesions EYES: normal, Conjunctiva are pink and non-injected, sclera clear Musculoskeletal:no cyanosis of digits and no clubbing  NEURO: alert & oriented x 3 with fluent speech, no focal motor/sensory deficits  LABORATORY DATA:  I have reviewed the data as listed Results for orders placed or performed in visit on 10/06/14 (from the past 48 hour(s))  Comprehensive metabolic panel     Status: Abnormal   Collection Time: 10/06/14 10:13 AM  Result Value Ref Range   Sodium 139 136 - 145 mEq/L   Potassium 3.6 3.5 - 5.1 mEq/L   Chloride 103 98 - 109 mEq/L   CO2 24 22 - 29 mEq/L   Glucose 114 70 - 140 mg/dl   BUN 13.0 7.0 - 26.0 mg/dL   Creatinine 0.7 0.6 - 1.1 mg/dL   Total Bilirubin 0.57 0.20 - 1.20 mg/dL   Alkaline Phosphatase 50 40 - 150 U/L   AST 22 5 - 34 U/L   ALT 20 0 - 55 U/L   Total Protein 7.2 6.4 - 8.3 g/dL   Albumin 4.1 3.5 - 5.0 g/dL   Calcium 9.2 8.4 - 10.4 mg/dL   Anion Gap 12 (H) 3 - 11 mEq/L   EGFR 88 (L) >90 ml/min/1.73 m2    Comment: eGFR is calculated using the CKD-EPI Creatinine Equation (2009)  CBC with Differential/Platelet     Status: Abnormal   Collection Time: 10/06/14 10:13 AM  Result Value Ref Range   WBC 5.8 3.9 - 10.3 10e3/uL   NEUT# 4.5 1.5 - 6.5 10e3/uL   HGB 13.4 11.6 - 15.9 g/dL   HCT 38.0 34.8 - 46.6 %   Platelets 180 145 - 400 10e3/uL   MCV 89.8 79.5 - 101.0 fL   MCH 31.7 25.1 - 34.0 pg   MCHC 35.3 31.5 - 36.0 g/dL   RBC 4.23 3.70 - 5.45 10e6/uL   RDW 14.2 11.2 - 14.5 %    lymph# 0.7 (L) 0.9 - 3.3 10e3/uL   MONO# 0.5 0.1 - 0.9 10e3/uL   Eosinophils Absolute 0.0 0.0 - 0.5 10e3/uL   Basophils Absolute 0.0 0.0 - 0.1 10e3/uL   NEUT% 77.8 (H) 38.4 - 76.8 %   LYMPH% 12.5 (L) 14.0 - 49.7 %   MONO% 9.4 0.0 - 14.0 %   EOS% 0.0 0.0 - 7.0 %   BASO% 0.3 0.0 - 2.0 %    Lab Results  Component Value Date   WBC 5.8 10/06/2014   HGB 13.4 10/06/2014   HCT 38.0 10/06/2014   MCV 89.8 10/06/2014  PLT 180 10/06/2014    ASSESSMENT & PLAN:  Felty's syndrome She is doing well with monthly rituximab therapy and that is able to control her arthritis and her neutropenia. In the meantime, I would recommend tapering her prednisone to 5 mg daily        Neutropenia associated with autoimmune disease She will get GCSF injection whenever ANC <1000. She is not symptomatic. She will continue on rituximab monthly and prednisone taper.        All questions were answered. The patient knows to call the clinic with any problems, questions or concerns. No barriers to learning was detected.  I spent 15 minutes counseling the patient face to face. The total time spent in the appointment was 20 minutes and more than 50% was on counseling.     [facility], Lamine Laton, MD 4/29/20163:52 PM

## 2014-10-06 NOTE — Patient Instructions (Signed)
[facility] Discharge Instructions for Patients Receiving Chemotherapy  Today you received the following biotherapy agents: Rituxan.   If you develop nausea and vomiting that is not controlled by your nausea medication, call the clinic.   BELOW ARE SYMPTOMS THAT SHOULD BE REPORTED IMMEDIATELY:  *FEVER GREATER THAN 100.5 F  *CHILLS WITH OR WITHOUT FEVER  NAUSEA AND VOMITING THAT IS NOT CONTROLLED WITH YOUR NAUSEA MEDICATION  *UNUSUAL SHORTNESS OF BREATH  *UNUSUAL BRUISING OR BLEEDING  TENDERNESS IN MOUTH AND THROAT WITH OR WITHOUT PRESENCE OF ULCERS  *URINARY PROBLEMS  *BOWEL PROBLEMS  UNUSUAL RASH Items with * indicate a potential emergency and should be followed up as soon as possible.  Feel free to call the clinic should you have any questions or concerns. The clinic phone number is [phone].  Please show the [facility] at check-in to the Emergency Department and triage nurse.

## 2014-10-06 NOTE — Assessment & Plan Note (Signed)
She will get GCSF injection whenever ANC <1000. She is not symptomatic. She will continue on rituximab monthly and prednisone taper.

## 2014-10-09 ENCOUNTER — Telehealth: Payer: Self-pay | Admitting: Hematology and Oncology

## 2014-10-09 NOTE — Telephone Encounter (Signed)
Called and left a message with appointments °

## 2014-10-12 ENCOUNTER — Telehealth: Payer: Self-pay | Admitting: *Deleted

## 2014-10-12 NOTE — Telephone Encounter (Signed)
Opened phone note in error 

## 2014-11-03 ENCOUNTER — Other Ambulatory Visit (HOSPITAL_BASED_OUTPATIENT_CLINIC_OR_DEPARTMENT_OTHER): Payer: Commercial Managed Care - HMO

## 2014-11-03 ENCOUNTER — Ambulatory Visit (HOSPITAL_BASED_OUTPATIENT_CLINIC_OR_DEPARTMENT_OTHER): Payer: Commercial Managed Care - HMO

## 2014-11-03 VITALS — BP 100/54 | HR 76 | Temp 98.2°F | Resp 20

## 2014-11-03 DIAGNOSIS — D708 Other neutropenia: Secondary | ICD-10-CM | POA: Diagnosis not present

## 2014-11-03 DIAGNOSIS — M0500 Felty's syndrome, unspecified site: Secondary | ICD-10-CM

## 2014-11-03 DIAGNOSIS — M359 Systemic involvement of connective tissue, unspecified: Secondary | ICD-10-CM

## 2014-11-03 DIAGNOSIS — D704 Cyclic neutropenia: Principal | ICD-10-CM

## 2014-11-03 DIAGNOSIS — Z5112 Encounter for antineoplastic immunotherapy: Secondary | ICD-10-CM

## 2014-11-03 DIAGNOSIS — M069 Rheumatoid arthritis, unspecified: Secondary | ICD-10-CM

## 2014-11-03 LAB — CBC WITH DIFFERENTIAL/PLATELET
BASO%: 0.9 % (ref 0.0–2.0)
BASOS ABS: 0 10*3/uL (ref 0.0–0.1)
EOS ABS: 0 10*3/uL (ref 0.0–0.5)
EOS%: 0 % (ref 0.0–7.0)
HCT: 41.4 % (ref 34.8–46.6)
HEMOGLOBIN: 14.1 g/dL (ref 11.6–15.9)
LYMPH#: 1.2 10*3/uL (ref 0.9–3.3)
LYMPH%: 24.6 % (ref 14.0–49.7)
MCH: 31.1 pg (ref 25.1–34.0)
MCHC: 34 g/dL (ref 31.5–36.0)
MCV: 91.3 fL (ref 79.5–101.0)
MONO#: 0.4 10*3/uL (ref 0.1–0.9)
MONO%: 8.7 % (ref 0.0–14.0)
NEUT#: 3.1 10*3/uL (ref 1.5–6.5)
NEUT%: 65.8 % (ref 38.4–76.8)
Platelets: 224 10*3/uL (ref 145–400)
RBC: 4.53 10*6/uL (ref 3.70–5.45)
RDW: 13.9 % (ref 11.2–14.5)
WBC: 4.8 10*3/uL (ref 3.9–10.3)

## 2014-11-03 LAB — COMPREHENSIVE METABOLIC PANEL (CC13)
ALBUMIN: 3.7 g/dL (ref 3.5–5.0)
ALT: 14 U/L (ref 0–55)
AST: 16 U/L (ref 5–34)
Alkaline Phosphatase: 59 U/L (ref 40–150)
Anion Gap: 16 mEq/L — ABNORMAL HIGH (ref 3–11)
BILIRUBIN TOTAL: 0.41 mg/dL (ref 0.20–1.20)
BUN: 8.1 mg/dL (ref 7.0–26.0)
CHLORIDE: 106 meq/L (ref 98–109)
CO2: 23 mEq/L (ref 22–29)
Calcium: 8.6 mg/dL (ref 8.4–10.4)
Creatinine: 0.8 mg/dL (ref 0.6–1.1)
EGFR: 80 mL/min/{1.73_m2} — ABNORMAL LOW (ref >90)
GLUCOSE: 158 mg/dL — AB (ref 70–140)
Potassium: 3.5 mEq/L (ref 3.5–5.1)
Sodium: 145 mEq/L (ref 136–145)
Total Protein: 7 g/dL (ref 6.4–8.3)

## 2014-11-03 MED ORDER — SODIUM CHLORIDE 0.9 % IV SOLN
Freq: Once | INTRAVENOUS | Status: AC
  Administered 2014-11-03: 12:00:00 via INTRAVENOUS

## 2014-11-03 MED ORDER — ACETAMINOPHEN 325 MG PO TABS
650.0000 mg | ORAL_TABLET | Freq: Once | ORAL | Status: AC
Start: 2014-11-03 — End: 2014-11-03
  Administered 2014-11-03: 650 mg via ORAL

## 2014-11-03 MED ORDER — DIPHENHYDRAMINE HCL 25 MG PO CAPS
ORAL_CAPSULE | ORAL | Status: AC
  Filled 2014-11-03: qty 2

## 2014-11-03 MED ORDER — ACETAMINOPHEN 325 MG PO TABS
ORAL_TABLET | ORAL | Status: AC
  Filled 2014-11-03: qty 2

## 2014-11-03 MED ORDER — SODIUM CHLORIDE 0.9 % IV SOLN
375.0000 mg/m2 | Freq: Once | INTRAVENOUS | Status: AC
  Administered 2014-11-03: 700 mg via INTRAVENOUS
  Filled 2014-11-03: qty 70

## 2014-11-03 MED ORDER — DIPHENHYDRAMINE HCL 25 MG PO CAPS
50.0000 mg | ORAL_CAPSULE | Freq: Once | ORAL | Status: AC
Start: 2014-11-03 — End: 2014-11-03
  Administered 2014-11-03: 50 mg via ORAL

## 2014-11-03 NOTE — Patient Instructions (Signed)
[facility] Discharge Instructions for Patients Receiving Chemotherapy  Today you received the following biotherapy agents: Rituxan.   If you develop nausea and vomiting that is not controlled by your nausea medication, call the clinic.   BELOW ARE SYMPTOMS THAT SHOULD BE REPORTED IMMEDIATELY:  *FEVER GREATER THAN 100.5 F  *CHILLS WITH OR WITHOUT FEVER  NAUSEA AND VOMITING THAT IS NOT CONTROLLED WITH YOUR NAUSEA MEDICATION  *UNUSUAL SHORTNESS OF BREATH  *UNUSUAL BRUISING OR BLEEDING  TENDERNESS IN MOUTH AND THROAT WITH OR WITHOUT PRESENCE OF ULCERS  *URINARY PROBLEMS  *BOWEL PROBLEMS  UNUSUAL RASH Items with * indicate a potential emergency and should be followed up as soon as possible.  Feel free to call the clinic should you have any questions or concerns. The clinic phone number is [phone].  Please show the [facility] at check-in to the Emergency Department and triage nurse.

## 2014-12-01 ENCOUNTER — Ambulatory Visit (HOSPITAL_BASED_OUTPATIENT_CLINIC_OR_DEPARTMENT_OTHER): Payer: Commercial Managed Care - HMO

## 2014-12-01 ENCOUNTER — Other Ambulatory Visit (HOSPITAL_BASED_OUTPATIENT_CLINIC_OR_DEPARTMENT_OTHER): Payer: Commercial Managed Care - HMO

## 2014-12-01 VITALS — BP 99/61 | HR 84 | Temp 98.0°F | Resp 18

## 2014-12-01 DIAGNOSIS — M0500 Felty's syndrome, unspecified site: Secondary | ICD-10-CM | POA: Diagnosis not present

## 2014-12-01 DIAGNOSIS — M359 Systemic involvement of connective tissue, unspecified: Secondary | ICD-10-CM

## 2014-12-01 DIAGNOSIS — D0500 Lobular carcinoma in situ of unspecified breast: Secondary | ICD-10-CM

## 2014-12-01 DIAGNOSIS — Z5112 Encounter for antineoplastic immunotherapy: Secondary | ICD-10-CM | POA: Diagnosis not present

## 2014-12-01 DIAGNOSIS — D708 Other neutropenia: Secondary | ICD-10-CM

## 2014-12-01 DIAGNOSIS — D704 Cyclic neutropenia: Principal | ICD-10-CM

## 2014-12-01 LAB — CBC WITH DIFFERENTIAL/PLATELET
BASO%: 0.6 % (ref 0.0–2.0)
Basophils Absolute: 0 10*3/uL (ref 0.0–0.1)
EOS%: 0 % (ref 0.0–7.0)
Eosinophils Absolute: 0 10*3/uL (ref 0.0–0.5)
HEMATOCRIT: 46 % (ref 34.8–46.6)
HEMOGLOBIN: 15.7 g/dL (ref 11.6–15.9)
LYMPH%: 24.8 % (ref 14.0–49.7)
MCH: 30.4 pg (ref 25.1–34.0)
MCHC: 34.1 g/dL (ref 31.5–36.0)
MCV: 89.1 fL (ref 79.5–101.0)
MONO#: 0.4 10*3/uL (ref 0.1–0.9)
MONO%: 10.2 % (ref 0.0–14.0)
NEUT#: 2.5 10*3/uL (ref 1.5–6.5)
NEUT%: 64.4 % (ref 38.4–76.8)
Platelets: 191 10*3/uL (ref 145–400)
RBC: 5.16 10*6/uL (ref 3.70–5.45)
RDW: 13.6 % (ref 11.2–14.5)
WBC: 3.9 10*3/uL (ref 3.9–10.3)
lymph#: 1 10*3/uL (ref 0.9–3.3)

## 2014-12-01 MED ORDER — SODIUM CHLORIDE 0.9 % IV SOLN
375.0000 mg/m2 | Freq: Once | INTRAVENOUS | Status: AC
  Administered 2014-12-01: 700 mg via INTRAVENOUS
  Filled 2014-12-01: qty 70

## 2014-12-01 MED ORDER — ACETAMINOPHEN 325 MG PO TABS
ORAL_TABLET | ORAL | Status: AC
  Filled 2014-12-01: qty 2

## 2014-12-01 MED ORDER — DIPHENHYDRAMINE HCL 25 MG PO CAPS
50.0000 mg | ORAL_CAPSULE | Freq: Once | ORAL | Status: AC
  Administered 2014-12-01: 50 mg via ORAL

## 2014-12-01 MED ORDER — DIPHENHYDRAMINE HCL 25 MG PO CAPS
ORAL_CAPSULE | ORAL | Status: AC
  Filled 2014-12-01: qty 2

## 2014-12-01 MED ORDER — SODIUM CHLORIDE 0.9 % IV SOLN
Freq: Once | INTRAVENOUS | Status: AC
  Administered 2014-12-01: 11:00:00 via INTRAVENOUS

## 2014-12-01 MED ORDER — ACETAMINOPHEN 325 MG PO TABS
650.0000 mg | ORAL_TABLET | Freq: Once | ORAL | Status: AC
  Administered 2014-12-01: 650 mg via ORAL

## 2014-12-01 NOTE — Patient Instructions (Signed)
[facility] Discharge Instructions for Patients Receiving Chemotherapy  Today you received the following chemotherapy agents: Rituxan   To help prevent nausea and vomiting after your treatment, we encourage you to take your nausea medication as directed.    If you develop nausea and vomiting that is not controlled by your nausea medication, call the clinic.   BELOW ARE SYMPTOMS THAT SHOULD BE REPORTED IMMEDIATELY:  *FEVER GREATER THAN 100.5 F  *CHILLS WITH OR WITHOUT FEVER  NAUSEA AND VOMITING THAT IS NOT CONTROLLED WITH YOUR NAUSEA MEDICATION  *UNUSUAL SHORTNESS OF BREATH  *UNUSUAL BRUISING OR BLEEDING  TENDERNESS IN MOUTH AND THROAT WITH OR WITHOUT PRESENCE OF ULCERS  *URINARY PROBLEMS  *BOWEL PROBLEMS  UNUSUAL RASH Items with * indicate a potential emergency and should be followed up as soon as possible.  Feel free to call the clinic you have any questions or concerns. The clinic phone number is [phone].  Please show the CHEMO ALERT CARD at check-in to the Emergency Department and triage nurse.   

## 2014-12-04 ENCOUNTER — Encounter: Payer: Self-pay | Admitting: Gastroenterology

## 2014-12-10 ENCOUNTER — Other Ambulatory Visit: Payer: Self-pay | Admitting: Hematology and Oncology

## 2015-01-05 ENCOUNTER — Other Ambulatory Visit (HOSPITAL_BASED_OUTPATIENT_CLINIC_OR_DEPARTMENT_OTHER): Payer: Self-pay

## 2015-01-05 ENCOUNTER — Ambulatory Visit (HOSPITAL_BASED_OUTPATIENT_CLINIC_OR_DEPARTMENT_OTHER): Payer: Self-pay

## 2015-01-05 ENCOUNTER — Ambulatory Visit (HOSPITAL_BASED_OUTPATIENT_CLINIC_OR_DEPARTMENT_OTHER): Payer: Self-pay | Admitting: Hematology and Oncology

## 2015-01-05 ENCOUNTER — Telehealth: Payer: Self-pay | Admitting: Hematology and Oncology

## 2015-01-05 ENCOUNTER — Encounter: Payer: Self-pay | Admitting: Hematology and Oncology

## 2015-01-05 VITALS — BP 119/69 | HR 101 | Temp 98.2°F | Resp 18 | Ht 62.0 in | Wt 181.0 lb

## 2015-01-05 VITALS — BP 124/67 | HR 80 | Resp 18

## 2015-01-05 DIAGNOSIS — Z5112 Encounter for antineoplastic immunotherapy: Secondary | ICD-10-CM

## 2015-01-05 DIAGNOSIS — M0500 Felty's syndrome, unspecified site: Secondary | ICD-10-CM

## 2015-01-05 DIAGNOSIS — D708 Other neutropenia: Secondary | ICD-10-CM

## 2015-01-05 DIAGNOSIS — D704 Cyclic neutropenia: Principal | ICD-10-CM

## 2015-01-05 DIAGNOSIS — M359 Systemic involvement of connective tissue, unspecified: Secondary | ICD-10-CM

## 2015-01-05 LAB — CBC WITH DIFFERENTIAL/PLATELET
BASO%: 0.6 % (ref 0.0–2.0)
BASOS ABS: 0 10*3/uL (ref 0.0–0.1)
EOS ABS: 0 10*3/uL (ref 0.0–0.5)
EOS%: 0 % (ref 0.0–7.0)
HCT: 42.2 % (ref 34.8–46.6)
HGB: 14.6 g/dL (ref 11.6–15.9)
LYMPH%: 20.1 % (ref 14.0–49.7)
MCH: 30.4 pg (ref 25.1–34.0)
MCHC: 34.6 g/dL (ref 31.5–36.0)
MCV: 88.1 fL (ref 79.5–101.0)
MONO#: 0.4 10*3/uL (ref 0.1–0.9)
MONO%: 9.6 % (ref 0.0–14.0)
NEUT#: 3 10*3/uL (ref 1.5–6.5)
NEUT%: 69.7 % (ref 38.4–76.8)
Platelets: 171 10*3/uL (ref 145–400)
RBC: 4.79 10*6/uL (ref 3.70–5.45)
RDW: 13.3 % (ref 11.2–14.5)
WBC: 4.3 10*3/uL (ref 3.9–10.3)
lymph#: 0.9 10*3/uL (ref 0.9–3.3)

## 2015-01-05 MED ORDER — SODIUM CHLORIDE 0.9 % IV SOLN
375.0000 mg/m2 | Freq: Once | INTRAVENOUS | Status: AC
  Administered 2015-01-05: 700 mg via INTRAVENOUS
  Filled 2015-01-05: qty 70

## 2015-01-05 MED ORDER — SODIUM CHLORIDE 0.9 % IV SOLN
Freq: Once | INTRAVENOUS | Status: AC
  Administered 2015-01-05: 12:00:00 via INTRAVENOUS

## 2015-01-05 MED ORDER — DIPHENHYDRAMINE HCL 25 MG PO CAPS
50.0000 mg | ORAL_CAPSULE | Freq: Once | ORAL | Status: AC
  Administered 2015-01-05: 50 mg via ORAL

## 2015-01-05 MED ORDER — ACETAMINOPHEN 325 MG PO TABS
650.0000 mg | ORAL_TABLET | Freq: Once | ORAL | Status: AC
  Administered 2015-01-05: 650 mg via ORAL

## 2015-01-05 MED ORDER — ACETAMINOPHEN 325 MG PO TABS
ORAL_TABLET | ORAL | Status: AC
  Filled 2015-01-05: qty 2

## 2015-01-05 MED ORDER — DIPHENHYDRAMINE HCL 25 MG PO CAPS
ORAL_CAPSULE | ORAL | Status: AC
  Filled 2015-01-05: qty 2

## 2015-01-05 NOTE — Assessment & Plan Note (Signed)
This has resolved while on rituximab. We will continue treatment once a month for this.

## 2015-01-05 NOTE — Progress Notes (Signed)
[facility] OFFICE PROGRESS NOTE  Patient Care Team: Thressa Sheller, MD as PCP - General (Internal Medicine) Leigh Aurora, MD as Consulting Physician (Rheumatology) Heath Lark, MD as Consulting Physician (Hematology and Oncology)  SUMMARY OF ONCOLOGIC HISTORY:  This patient had been followed here for many years. 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 Bone marrow aspirate and biopsy was performed in 2002 and 2004 with no abnormalities found. The patient was treated with G-CSF with good response. She was subsequently started on methotrexate weekly along with prednisone.  On 09/09/2013, methotrexate was discontinued due to severe neutropenia. She was started on G-CSF injection daily. From 10/14/2013 to 11/04/13, she was started on weekly rituximab. In October 2015, her neutropenia has relapsed. From 04/07/2014 to 04/28/14, she received rituximab again. From 05/31/14, Rituximab is restarted and is given on a monthly basis In June 2016, we were able to reduce prednisone down to 5 mg daily  INTERVAL HISTORY: Please see below for problem oriented charting. She feels well. She have recent hand surgery due to her severe rheumatoid arthritis. Since we taper her prednisone down, she denies recent flare of bone pain. Denies recent infection.  REVIEW OF SYSTEMS:   Constitutional: Denies fevers, chills or abnormal weight loss Eyes: Denies blurriness of vision Ears, nose, mouth, throat, and face: Denies mucositis or sore throat Respiratory: Denies cough, dyspnea or wheezes Cardiovascular: Denies palpitation, chest discomfort or lower extremity swelling Gastrointestinal:  Denies nausea, heartburn or change in bowel habits Skin: Denies abnormal skin rashes Lymphatics: Denies new lymphadenopathy or easy bruising Neurological:Denies numbness, tingling or new weaknesses Behavioral/Psych: Mood is stable, no new changes  All other  systems were reviewed with the patient and are negative.  I have reviewed the past medical history, past surgical history, social history and family history with the patient and they are unchanged from previous note.  ALLERGIES:  is allergic to penicillins.  MEDICATIONS:  Current Outpatient Prescriptions  Medication Sig Dispense Refill  . Alum & Mag Hydroxide-Simeth (MAGIC MOUTHWASH W/LIDOCAINE) SOLN Take 5 mLs by mouth 3 (three) times daily as needed. 240 mL 0  . Ascorbic Acid (VITAMIN C) 1000 MG tablet Take 1,000 mg by mouth daily.    . calcium carbonate (CALCIUM 600) 600 MG TABS Take 600 mg by mouth 2 (two) times daily with a meal.      . Cholecalciferol (VITAMIN D) 1000 UNITS capsule Take 1,000 Units by mouth daily.     . cyclobenzaprine (FLEXERIL) 10 MG tablet Take 1 tablet (10 mg total) by mouth 3 (three) times daily as needed for muscle spasms. 90 tablet 2  . diclofenac sodium (VOLTAREN) 1 % GEL Apply 2 g topically 4 (four) times daily. 3 Tube 2  . DULoxetine (CYMBALTA) 30 MG capsule Take 30 mg by mouth 2 (two) times daily.     Marland Kitchen HYDROcodone-acetaminophen (NORCO) 7.5-325 MG per tablet Take 1 tablet by mouth every 4 (four) hours as needed. Max 6 per day 180 tablet 0  . Melatonin 10 MG CAPS Take 5 mg by mouth at bedtime.     . nortriptyline (PAMELOR) 50 MG capsule TAKE 2 CAPSULES BY MOUTH AT BEDTIME 180 capsule 0  . omeprazole (PRILOSEC) 20 MG capsule Take 20 mg by mouth daily.     . predniSONE (DELTASONE) 5 MG tablet TAKE 1 1/2 TABLETS BY MOUTH EVERY MORNING WITH BREAKFAST 90 tablet 0  . pregabalin (LYRICA) 200 MG capsule TAKE 1 CAPSULE BY MOUTH TWICE DAILY  *  90 day supply* 180 capsule 0  . Probiotic Product (PROBIOTIC FORMULA) CAPS Take 1 capsule by mouth daily.      . vitamin B-12 (CYANOCOBALAMIN) 1000 MCG tablet Take 1,000 mcg by mouth every other day.     . zinc gluconate 50 MG tablet Take 100 mg by mouth daily.    . [DISCONTINUED] POTASSIUM PO Take 20 mEq by mouth 2 (two) times  daily.     No current facility-administered medications for this visit.    PHYSICAL EXAMINATION: ECOG PERFORMANCE STATUS: 0 - Asymptomatic  Filed Vitals:   01/05/15 1136  BP: 119/69  Pulse: 101  Temp: 98.2 F (36.8 C)  Resp: 18   Filed Weights   01/05/15 1136  Weight: 181 lb (82.101 kg)    GENERAL:alert, no distress and comfortable SKIN: skin color, texture, turgor are normal, no rashes or significant lesions EYES: normal, Conjunctiva are pink and non-injected, sclera clear OROPHARYNX:no exudate, no erythema and lips, buccal mucosa, and tongue normal  NECK: supple, thyroid normal size, non-tender, without nodularity LYMPH:  no palpable lymphadenopathy in the cervical, axillary or inguinal LUNGS: clear to auscultation and percussion with normal breathing effort HEART: regular rate & rhythm and no murmurs and no lower extremity edema ABDOMEN:abdomen soft, non-tender and normal bowel sounds Musculoskeletal:no cyanosis of digits and no clubbing  NEURO: alert & oriented x 3 with fluent speech, no focal motor/sensory deficits  LABORATORY DATA:  I have reviewed the data as listed    Component Value Date/Time   NA 145 11/03/2014 1048   NA 140 10/16/2011 1338   K 3.5 11/03/2014 1048   K 3.8 10/16/2011 1338   CL 99 10/19/2012 1051   CL 105 10/16/2011 1338   CO2 23 11/03/2014 1048   CO2 26 10/16/2011 1338   GLUCOSE 158* 11/03/2014 1048   GLUCOSE 143* 10/19/2012 1051   GLUCOSE 140* 10/16/2011 1338   BUN 8.1 11/03/2014 1048   BUN 10 10/16/2011 1338   CREATININE 0.8 11/03/2014 1048   CREATININE 0.82 10/16/2011 1338   CALCIUM 8.6 11/03/2014 1048   CALCIUM 9.3 10/16/2011 1338   PROT 7.0 11/03/2014 1048   PROT 7.1 10/16/2011 1338   ALBUMIN 3.7 11/03/2014 1048   ALBUMIN 4.0 10/16/2011 1338   AST 16 11/03/2014 1048   AST 17 10/16/2011 1338   ALT 14 11/03/2014 1048   ALT 19 10/16/2011 1338   ALKPHOS 59 11/03/2014 1048   ALKPHOS 44 10/16/2011 1338   BILITOT 0.41 11/03/2014  1048   BILITOT 0.5 10/16/2011 1338   GFRNONAA >60 03/01/2011 1112   GFRAA >60 03/01/2011 1112    No results found for: SPEP, UPEP  Lab Results  Component Value Date   WBC 4.3 01/05/2015   NEUTROABS 3.0 01/05/2015   HGB 14.6 01/05/2015   HCT 42.2 01/05/2015   MCV 88.1 01/05/2015   PLT 171 01/05/2015      Chemistry      Component Value Date/Time   NA 145 11/03/2014 1048   NA 140 10/16/2011 1338   K 3.5 11/03/2014 1048   K 3.8 10/16/2011 1338   CL 99 10/19/2012 1051   CL 105 10/16/2011 1338   CO2 23 11/03/2014 1048   CO2 26 10/16/2011 1338   BUN 8.1 11/03/2014 1048   BUN 10 10/16/2011 1338   CREATININE 0.8 11/03/2014 1048   CREATININE 0.82 10/16/2011 1338      Component Value Date/Time   CALCIUM 8.6 11/03/2014 1048   CALCIUM 9.3 10/16/2011 1338   ALKPHOS  59 11/03/2014 1048   ALKPHOS 44 10/16/2011 1338   AST 16 11/03/2014 1048   AST 17 10/16/2011 1338   ALT 14 11/03/2014 1048   ALT 19 10/16/2011 1338   BILITOT 0.41 11/03/2014 1048   BILITOT 0.5 10/16/2011 1338      ASSESSMENT & PLAN:  Felty's syndrome She is doing well with monthly rituximab therapy and that is able to control her arthritis and her neutropenia. In the meantime, I would recommend she continue prednisone at 5 mg daily      Neutropenia associated with autoimmune disease This has resolved while on rituximab. We will continue treatment once a month for this.   No orders of the defined types were placed in this encounter.   All questions were answered. The patient knows to call the clinic with any problems, questions or concerns. No barriers to learning was detected. I spent 15 minutes counseling the patient face to face. The total time spent in the appointment was 20 minutes and more than 50% was on counseling and review of test results     [facility], [place], MD 01/05/2015 12:05 PM

## 2015-01-05 NOTE — Telephone Encounter (Signed)
Gave adn printed appt sched and avs for pt for Aug thru Dec

## 2015-01-05 NOTE — Assessment & Plan Note (Signed)
She is doing well with monthly rituximab therapy and that is able to control her arthritis and her neutropenia. In the meantime, I would recommend she continue prednisone at 5 mg daily

## 2015-01-05 NOTE — Patient Instructions (Signed)
[facility] Discharge Instructions for Patients Receiving Chemotherapy  Today you received the following chemotherapy agents Rituxan To help prevent nausea and vomiting after your treatment, we encourage you to take your nausea medication as prescribed.  If you develop nausea and vomiting that is not controlled by your nausea medication, call the clinic.   BELOW ARE SYMPTOMS THAT SHOULD BE REPORTED IMMEDIATELY:  *FEVER GREATER THAN 100.5 F  *CHILLS WITH OR WITHOUT FEVER  NAUSEA AND VOMITING THAT IS NOT CONTROLLED WITH YOUR NAUSEA MEDICATION  *UNUSUAL SHORTNESS OF BREATH  *UNUSUAL BRUISING OR BLEEDING  TENDERNESS IN MOUTH AND THROAT WITH OR WITHOUT PRESENCE OF ULCERS  *URINARY PROBLEMS  *BOWEL PROBLEMS  UNUSUAL RASH Items with * indicate a potential emergency and should be followed up as soon as possible.  Feel free to call the clinic you have any questions or concerns. The clinic phone number is [phone].  Please show the CHEMO ALERT CARD at check-in to the Emergency Department and triage nurse.   

## 2015-02-02 ENCOUNTER — Ambulatory Visit (HOSPITAL_BASED_OUTPATIENT_CLINIC_OR_DEPARTMENT_OTHER): Payer: Medicare PPO

## 2015-02-02 ENCOUNTER — Other Ambulatory Visit (HOSPITAL_BASED_OUTPATIENT_CLINIC_OR_DEPARTMENT_OTHER): Payer: Medicare PPO

## 2015-02-02 VITALS — BP 101/62 | HR 76 | Temp 98.4°F | Resp 18

## 2015-02-02 DIAGNOSIS — D704 Cyclic neutropenia: Principal | ICD-10-CM

## 2015-02-02 DIAGNOSIS — M0500 Felty's syndrome, unspecified site: Secondary | ICD-10-CM

## 2015-02-02 DIAGNOSIS — M359 Systemic involvement of connective tissue, unspecified: Secondary | ICD-10-CM

## 2015-02-02 DIAGNOSIS — D708 Other neutropenia: Secondary | ICD-10-CM | POA: Diagnosis not present

## 2015-02-02 DIAGNOSIS — M0509 Felty's syndrome, multiple sites: Secondary | ICD-10-CM

## 2015-02-02 LAB — CBC WITH DIFFERENTIAL/PLATELET
BASO%: 0.8 % (ref 0.0–2.0)
BASOS ABS: 0 10*3/uL (ref 0.0–0.1)
EOS ABS: 0 10*3/uL (ref 0.0–0.5)
EOS%: 0.1 % (ref 0.0–7.0)
HCT: 39.5 % (ref 34.8–46.6)
HGB: 13.6 g/dL (ref 11.6–15.9)
LYMPH%: 35.4 % (ref 14.0–49.7)
MCH: 30.8 pg (ref 25.1–34.0)
MCHC: 34.5 g/dL (ref 31.5–36.0)
MCV: 89.4 fL (ref 79.5–101.0)
MONO#: 0.5 10*3/uL (ref 0.1–0.9)
MONO%: 13 % (ref 0.0–14.0)
NEUT#: 2 10*3/uL (ref 1.5–6.5)
NEUT%: 50.7 % (ref 38.4–76.8)
Platelets: 165 10*3/uL (ref 145–400)
RBC: 4.41 10*6/uL (ref 3.70–5.45)
RDW: 14 % (ref 11.2–14.5)
WBC: 3.9 10*3/uL (ref 3.9–10.3)
lymph#: 1.4 10*3/uL (ref 0.9–3.3)

## 2015-02-02 MED ORDER — ACETAMINOPHEN 325 MG PO TABS
650.0000 mg | ORAL_TABLET | Freq: Once | ORAL | Status: AC
  Administered 2015-02-02: 650 mg via ORAL

## 2015-02-02 MED ORDER — SODIUM CHLORIDE 0.9 % IV SOLN
Freq: Once | INTRAVENOUS | Status: AC
  Administered 2015-02-02: 11:00:00 via INTRAVENOUS

## 2015-02-02 MED ORDER — SODIUM CHLORIDE 0.9 % IV SOLN
375.0000 mg/m2 | Freq: Once | INTRAVENOUS | Status: AC
  Administered 2015-02-02: 700 mg via INTRAVENOUS
  Filled 2015-02-02: qty 70

## 2015-02-02 MED ORDER — DIPHENHYDRAMINE HCL 25 MG PO CAPS
ORAL_CAPSULE | ORAL | Status: AC
  Filled 2015-02-02: qty 2

## 2015-02-02 MED ORDER — DIPHENHYDRAMINE HCL 25 MG PO CAPS
50.0000 mg | ORAL_CAPSULE | Freq: Once | ORAL | Status: AC
  Administered 2015-02-02: 50 mg via ORAL

## 2015-02-02 MED ORDER — ACETAMINOPHEN 325 MG PO TABS
ORAL_TABLET | ORAL | Status: AC
  Filled 2015-02-02: qty 2

## 2015-02-02 NOTE — Patient Instructions (Signed)
[facility] Discharge Instructions for Patients Receiving Chemotherapy  Today you received the following chemotherapy agents rituxan.   To help prevent nausea and vomiting after your treatment, we encourage you to take your nausea medication as directed.     If you develop nausea and vomiting that is not controlled by your nausea medication, call the clinic.   BELOW ARE SYMPTOMS THAT SHOULD BE REPORTED IMMEDIATELY:  *FEVER GREATER THAN 100.5 F  *CHILLS WITH OR WITHOUT FEVER  NAUSEA AND VOMITING THAT IS NOT CONTROLLED WITH YOUR NAUSEA MEDICATION  *UNUSUAL SHORTNESS OF BREATH  *UNUSUAL BRUISING OR BLEEDING  TENDERNESS IN MOUTH AND THROAT WITH OR WITHOUT PRESENCE OF ULCERS  *URINARY PROBLEMS  *BOWEL PROBLEMS  UNUSUAL RASH Items with * indicate a potential emergency and should be followed up as soon as possible.  Feel free to call the clinic you have any questions or concerns. The clinic phone number is [phone].  

## 2015-03-02 ENCOUNTER — Other Ambulatory Visit (HOSPITAL_BASED_OUTPATIENT_CLINIC_OR_DEPARTMENT_OTHER): Payer: Medicare PPO

## 2015-03-02 ENCOUNTER — Ambulatory Visit (HOSPITAL_BASED_OUTPATIENT_CLINIC_OR_DEPARTMENT_OTHER): Payer: Medicare PPO

## 2015-03-02 VITALS — BP 103/60 | HR 76 | Temp 97.5°F | Resp 18

## 2015-03-02 DIAGNOSIS — M0509 Felty's syndrome, multiple sites: Secondary | ICD-10-CM | POA: Diagnosis not present

## 2015-03-02 DIAGNOSIS — M0500 Felty's syndrome, unspecified site: Secondary | ICD-10-CM | POA: Diagnosis not present

## 2015-03-02 DIAGNOSIS — M359 Systemic involvement of connective tissue, unspecified: Secondary | ICD-10-CM

## 2015-03-02 DIAGNOSIS — D704 Cyclic neutropenia: Principal | ICD-10-CM

## 2015-03-02 LAB — CBC WITH DIFFERENTIAL/PLATELET
BASO%: 0.6 % (ref 0.0–2.0)
BASOS ABS: 0 10*3/uL (ref 0.0–0.1)
EOS%: 0.1 % (ref 0.0–7.0)
Eosinophils Absolute: 0 10*3/uL (ref 0.0–0.5)
HEMATOCRIT: 41.3 % (ref 34.8–46.6)
HGB: 14.5 g/dL (ref 11.6–15.9)
LYMPH#: 1.2 10*3/uL (ref 0.9–3.3)
LYMPH%: 22.1 % (ref 14.0–49.7)
MCH: 31.3 pg (ref 25.1–34.0)
MCHC: 35 g/dL (ref 31.5–36.0)
MCV: 89.3 fL (ref 79.5–101.0)
MONO#: 0.5 10*3/uL (ref 0.1–0.9)
MONO%: 9.1 % (ref 0.0–14.0)
NEUT#: 3.7 10*3/uL (ref 1.5–6.5)
NEUT%: 68.1 % (ref 38.4–76.8)
PLATELETS: 168 10*3/uL (ref 145–400)
RBC: 4.62 10*6/uL (ref 3.70–5.45)
RDW: 13.9 % (ref 11.2–14.5)
WBC: 5.4 10*3/uL (ref 3.9–10.3)

## 2015-03-02 MED ORDER — SODIUM CHLORIDE 0.9 % IV SOLN
Freq: Once | INTRAVENOUS | Status: AC
  Administered 2015-03-02: 12:00:00 via INTRAVENOUS

## 2015-03-02 MED ORDER — ACETAMINOPHEN 325 MG PO TABS
ORAL_TABLET | ORAL | Status: AC
  Filled 2015-03-02: qty 2

## 2015-03-02 MED ORDER — SODIUM CHLORIDE 0.9 % IV SOLN
375.0000 mg/m2 | Freq: Once | INTRAVENOUS | Status: AC
  Administered 2015-03-02: 700 mg via INTRAVENOUS
  Filled 2015-03-02: qty 70

## 2015-03-02 MED ORDER — ACETAMINOPHEN 325 MG PO TABS
650.0000 mg | ORAL_TABLET | Freq: Once | ORAL | Status: AC
  Administered 2015-03-02: 650 mg via ORAL

## 2015-03-02 MED ORDER — DIPHENHYDRAMINE HCL 25 MG PO CAPS
ORAL_CAPSULE | ORAL | Status: AC
Start: 2015-03-02 — End: 2015-03-02
  Filled 2015-03-02: qty 2

## 2015-03-02 MED ORDER — DIPHENHYDRAMINE HCL 25 MG PO CAPS
50.0000 mg | ORAL_CAPSULE | Freq: Once | ORAL | Status: AC
  Administered 2015-03-02: 50 mg via ORAL

## 2015-03-02 NOTE — Patient Instructions (Signed)
[facility] Discharge Instructions for Patients Receiving Chemotherapy  Today you received the following chemotherapy agents Rituxan To help prevent nausea and vomiting after your treatment, we encourage you to take your nausea medication as prescribed.  If you develop nausea and vomiting that is not controlled by your nausea medication, call the clinic.   BELOW ARE SYMPTOMS THAT SHOULD BE REPORTED IMMEDIATELY:  *FEVER GREATER THAN 100.5 F  *CHILLS WITH OR WITHOUT FEVER  NAUSEA AND VOMITING THAT IS NOT CONTROLLED WITH YOUR NAUSEA MEDICATION  *UNUSUAL SHORTNESS OF BREATH  *UNUSUAL BRUISING OR BLEEDING  TENDERNESS IN MOUTH AND THROAT WITH OR WITHOUT PRESENCE OF ULCERS  *URINARY PROBLEMS  *BOWEL PROBLEMS  UNUSUAL RASH Items with * indicate a potential emergency and should be followed up as soon as possible.  Feel free to call the clinic you have any questions or concerns. The clinic phone number is [phone].  Please show the CHEMO ALERT CARD at check-in to the Emergency Department and triage nurse.   

## 2015-03-20 ENCOUNTER — Encounter: Payer: Self-pay | Admitting: Hematology and Oncology

## 2015-03-22 ENCOUNTER — Other Ambulatory Visit: Payer: Self-pay | Admitting: Hematology and Oncology

## 2015-03-30 ENCOUNTER — Telehealth: Payer: Self-pay | Admitting: *Deleted

## 2015-03-30 ENCOUNTER — Other Ambulatory Visit: Payer: Commercial Managed Care - HMO

## 2015-03-30 ENCOUNTER — Ambulatory Visit: Payer: Commercial Managed Care - HMO

## 2015-03-30 NOTE — Telephone Encounter (Signed)
Patient has called back and I rescheduled her appts from tomorrow to Tuesday. Patient verbalized the new date and time.

## 2015-03-30 NOTE — Telephone Encounter (Signed)
Pt n/s to labs at 11; called and left message inquiring about pt and our ability to fit pt in if she can get here today in a timely manner; left call back number to infusion rm

## 2015-04-03 ENCOUNTER — Other Ambulatory Visit: Payer: Self-pay | Admitting: *Deleted

## 2015-04-03 ENCOUNTER — Ambulatory Visit: Payer: Self-pay

## 2015-04-03 ENCOUNTER — Other Ambulatory Visit: Payer: Medicare PPO

## 2015-04-03 ENCOUNTER — Telehealth: Payer: Self-pay | Admitting: Hematology and Oncology

## 2015-04-03 ENCOUNTER — Telehealth: Payer: Self-pay | Admitting: *Deleted

## 2015-04-03 NOTE — Telephone Encounter (Signed)
s.w. pt and gv all new appts....pt ok and aware

## 2015-04-03 NOTE — Telephone Encounter (Signed)
She can reschedule at her convenience

## 2015-04-03 NOTE — Telephone Encounter (Signed)
Pt called infusion room to say she cannot come for treatment and labs today as she does not have a sitter. Will need to reschedule

## 2015-04-13 ENCOUNTER — Ambulatory Visit (HOSPITAL_BASED_OUTPATIENT_CLINIC_OR_DEPARTMENT_OTHER): Payer: Medicare PPO

## 2015-04-13 ENCOUNTER — Other Ambulatory Visit (HOSPITAL_BASED_OUTPATIENT_CLINIC_OR_DEPARTMENT_OTHER): Payer: Medicare PPO

## 2015-04-13 VITALS — BP 125/85 | HR 84 | Temp 98.3°F | Resp 20

## 2015-04-13 DIAGNOSIS — M0500 Felty's syndrome, unspecified site: Secondary | ICD-10-CM | POA: Diagnosis not present

## 2015-04-13 DIAGNOSIS — M359 Systemic involvement of connective tissue, unspecified: Secondary | ICD-10-CM

## 2015-04-13 DIAGNOSIS — Z5112 Encounter for antineoplastic immunotherapy: Secondary | ICD-10-CM | POA: Diagnosis not present

## 2015-04-13 DIAGNOSIS — M0509 Felty's syndrome, multiple sites: Secondary | ICD-10-CM

## 2015-04-13 DIAGNOSIS — D704 Cyclic neutropenia: Principal | ICD-10-CM

## 2015-04-13 LAB — CBC WITH DIFFERENTIAL/PLATELET
BASO%: 0.5 % (ref 0.0–2.0)
Basophils Absolute: 0 10*3/uL (ref 0.0–0.1)
EOS%: 0.3 % (ref 0.0–7.0)
Eosinophils Absolute: 0 10*3/uL (ref 0.0–0.5)
HCT: 41 % (ref 34.8–46.6)
HGB: 14.1 g/dL (ref 11.6–15.9)
LYMPH%: 22.2 % (ref 14.0–49.7)
MCH: 31 pg (ref 25.1–34.0)
MCHC: 34.4 g/dL (ref 31.5–36.0)
MCV: 90.2 fL (ref 79.5–101.0)
MONO#: 0.3 10*3/uL (ref 0.1–0.9)
MONO%: 8.3 % (ref 0.0–14.0)
NEUT%: 68.7 % (ref 38.4–76.8)
NEUTROS ABS: 2.4 10*3/uL (ref 1.5–6.5)
Platelets: 153 10*3/uL (ref 145–400)
RBC: 4.55 10*6/uL (ref 3.70–5.45)
RDW: 12.9 % (ref 11.2–14.5)
WBC: 3.5 10*3/uL — AB (ref 3.9–10.3)
lymph#: 0.8 10*3/uL — ABNORMAL LOW (ref 0.9–3.3)

## 2015-04-13 MED ORDER — DIPHENHYDRAMINE HCL 25 MG PO CAPS
ORAL_CAPSULE | ORAL | Status: AC
  Filled 2015-04-13: qty 2

## 2015-04-13 MED ORDER — SODIUM CHLORIDE 0.9 % IV SOLN
375.0000 mg/m2 | Freq: Once | INTRAVENOUS | Status: AC
  Administered 2015-04-13: 700 mg via INTRAVENOUS
  Filled 2015-04-13: qty 60

## 2015-04-13 MED ORDER — DIPHENHYDRAMINE HCL 25 MG PO CAPS
50.0000 mg | ORAL_CAPSULE | Freq: Once | ORAL | Status: AC
  Administered 2015-04-13: 50 mg via ORAL

## 2015-04-13 MED ORDER — ACETAMINOPHEN 325 MG PO TABS
650.0000 mg | ORAL_TABLET | Freq: Once | ORAL | Status: AC
  Administered 2015-04-13: 650 mg via ORAL

## 2015-04-13 MED ORDER — ACETAMINOPHEN 325 MG PO TABS
ORAL_TABLET | ORAL | Status: AC
  Filled 2015-04-13: qty 2

## 2015-04-13 MED ORDER — SODIUM CHLORIDE 0.9 % IV SOLN
Freq: Once | INTRAVENOUS | Status: AC
  Administered 2015-04-13: 13:00:00 via INTRAVENOUS

## 2015-04-17 ENCOUNTER — Telehealth: Payer: Self-pay | Admitting: *Deleted

## 2015-04-17 ENCOUNTER — Other Ambulatory Visit: Payer: Self-pay | Admitting: *Deleted

## 2015-04-17 MED ORDER — PREDNISONE 5 MG PO TABS: ORAL_TABLET | ORAL | Status: DC

## 2015-04-17 NOTE — Telephone Encounter (Signed)
TC received from pt requesting that her prescriptions for Prednisone be sent to [facility] as she will be getting it filled there @ no charge.

## 2015-04-17 NOTE — Telephone Encounter (Signed)
Please send refills

## 2015-04-27 ENCOUNTER — Other Ambulatory Visit: Payer: Commercial Managed Care - HMO

## 2015-04-27 ENCOUNTER — Ambulatory Visit: Payer: Commercial Managed Care - HMO

## 2015-04-27 ENCOUNTER — Other Ambulatory Visit: Payer: Self-pay | Admitting: Hematology and Oncology

## 2015-05-10 ENCOUNTER — Other Ambulatory Visit: Payer: Self-pay | Admitting: Hematology and Oncology

## 2015-05-11 ENCOUNTER — Ambulatory Visit (HOSPITAL_BASED_OUTPATIENT_CLINIC_OR_DEPARTMENT_OTHER): Payer: Medicare PPO

## 2015-05-11 ENCOUNTER — Other Ambulatory Visit (HOSPITAL_BASED_OUTPATIENT_CLINIC_OR_DEPARTMENT_OTHER): Payer: Medicare PPO

## 2015-05-11 VITALS — BP 123/66 | HR 80 | Temp 97.0°F | Resp 18

## 2015-05-11 DIAGNOSIS — M0509 Felty's syndrome, multiple sites: Secondary | ICD-10-CM | POA: Diagnosis not present

## 2015-05-11 DIAGNOSIS — D708 Other neutropenia: Secondary | ICD-10-CM | POA: Diagnosis not present

## 2015-05-11 DIAGNOSIS — Z5112 Encounter for antineoplastic immunotherapy: Secondary | ICD-10-CM | POA: Diagnosis not present

## 2015-05-11 DIAGNOSIS — M0500 Felty's syndrome, unspecified site: Secondary | ICD-10-CM

## 2015-05-11 DIAGNOSIS — D704 Cyclic neutropenia: Principal | ICD-10-CM

## 2015-05-11 DIAGNOSIS — M359 Systemic involvement of connective tissue, unspecified: Secondary | ICD-10-CM

## 2015-05-11 LAB — CBC WITH DIFFERENTIAL/PLATELET
BASO%: 0.6 % (ref 0.0–2.0)
BASOS ABS: 0 10*3/uL (ref 0.0–0.1)
EOS ABS: 0 10*3/uL (ref 0.0–0.5)
EOS%: 0.2 % (ref 0.0–7.0)
HCT: 44.3 % (ref 34.8–46.6)
HGB: 15.4 g/dL (ref 11.6–15.9)
LYMPH%: 23 % (ref 14.0–49.7)
MCH: 31.1 pg (ref 25.1–34.0)
MCHC: 34.8 g/dL (ref 31.5–36.0)
MCV: 89.5 fL (ref 79.5–101.0)
MONO#: 0.5 10*3/uL (ref 0.1–0.9)
MONO%: 10.1 % (ref 0.0–14.0)
NEUT%: 66.1 % (ref 38.4–76.8)
NEUTROS ABS: 3.1 10*3/uL (ref 1.5–6.5)
PLATELETS: 151 10*3/uL (ref 145–400)
RBC: 4.95 10*6/uL (ref 3.70–5.45)
RDW: 13.3 % (ref 11.2–14.5)
WBC: 4.7 10*3/uL (ref 3.9–10.3)
lymph#: 1.1 10*3/uL (ref 0.9–3.3)

## 2015-05-11 MED ORDER — ACETAMINOPHEN 325 MG PO TABS
650.0000 mg | ORAL_TABLET | Freq: Once | ORAL | Status: AC
  Administered 2015-05-11: 650 mg via ORAL

## 2015-05-11 MED ORDER — ACETAMINOPHEN 325 MG PO TABS
ORAL_TABLET | ORAL | Status: AC
  Filled 2015-05-11: qty 2

## 2015-05-11 MED ORDER — DIPHENHYDRAMINE HCL 25 MG PO CAPS
ORAL_CAPSULE | ORAL | Status: AC
  Filled 2015-05-11: qty 2

## 2015-05-11 MED ORDER — RITUXIMAB CHEMO INJECTION 500 MG/50ML
375.0000 mg/m2 | Freq: Once | INTRAVENOUS | Status: AC
  Administered 2015-05-11: 700 mg via INTRAVENOUS
  Filled 2015-05-11: qty 70

## 2015-05-11 MED ORDER — DIPHENHYDRAMINE HCL 25 MG PO CAPS
50.0000 mg | ORAL_CAPSULE | Freq: Once | ORAL | Status: AC
  Administered 2015-05-11: 50 mg via ORAL

## 2015-05-11 MED ORDER — SODIUM CHLORIDE 0.9 % IV SOLN
Freq: Once | INTRAVENOUS | Status: AC
  Administered 2015-05-11: 12:00:00 via INTRAVENOUS

## 2015-05-11 NOTE — Progress Notes (Signed)
Last CMET 11/03/14, Dr. Alvy Bimler aware, okay to proceed with Treatment per Dr. Alvy Bimler.

## 2015-05-11 NOTE — Patient Instructions (Signed)
[facility] Discharge Instructions for Patients Receiving Chemotherapy  Today you received the following chemotherapy agents: Rituxan   To help prevent nausea and vomiting after your treatment, we encourage you to take your nausea medication as directed.    If you develop nausea and vomiting that is not controlled by your nausea medication, call the clinic.   BELOW ARE SYMPTOMS THAT SHOULD BE REPORTED IMMEDIATELY:  *FEVER GREATER THAN 100.5 F  *CHILLS WITH OR WITHOUT FEVER  NAUSEA AND VOMITING THAT IS NOT CONTROLLED WITH YOUR NAUSEA MEDICATION  *UNUSUAL SHORTNESS OF BREATH  *UNUSUAL BRUISING OR BLEEDING  TENDERNESS IN MOUTH AND THROAT WITH OR WITHOUT PRESENCE OF ULCERS  *URINARY PROBLEMS  *BOWEL PROBLEMS  UNUSUAL RASH Items with * indicate a potential emergency and should be followed up as soon as possible.  Feel free to call the clinic you have any questions or concerns. The clinic phone number is [phone].  Please show the CHEMO ALERT CARD at check-in to the Emergency Department and triage nurse.   

## 2015-05-25 ENCOUNTER — Other Ambulatory Visit: Payer: Commercial Managed Care - HMO

## 2015-05-25 ENCOUNTER — Ambulatory Visit: Payer: Commercial Managed Care - HMO

## 2015-05-25 ENCOUNTER — Ambulatory Visit: Payer: Commercial Managed Care - HMO | Admitting: Hematology and Oncology

## 2015-06-08 ENCOUNTER — Telehealth: Payer: Self-pay | Admitting: Hematology and Oncology

## 2015-06-08 ENCOUNTER — Telehealth: Payer: Self-pay | Admitting: *Deleted

## 2015-06-08 ENCOUNTER — Other Ambulatory Visit: Payer: Self-pay | Admitting: *Deleted

## 2015-06-08 ENCOUNTER — Other Ambulatory Visit: Payer: Medicare PPO

## 2015-06-08 ENCOUNTER — Ambulatory Visit: Payer: Medicare PPO

## 2015-06-08 ENCOUNTER — Ambulatory Visit: Payer: Medicare PPO | Admitting: Hematology and Oncology

## 2015-06-08 ENCOUNTER — Other Ambulatory Visit: Payer: Self-pay | Admitting: Hematology and Oncology

## 2015-06-08 NOTE — Telephone Encounter (Signed)
called and left a message with new appointments on 06/22/15  anne

## 2015-06-08 NOTE — Telephone Encounter (Signed)
Pt called to cancel appt today. States she has been up all night with nausea and diarrhea. Her sister is picking her up to go to PCP. Wants to reschedule appts

## 2015-06-22 ENCOUNTER — Encounter: Payer: Self-pay | Admitting: Hematology and Oncology

## 2015-06-22 ENCOUNTER — Other Ambulatory Visit (HOSPITAL_BASED_OUTPATIENT_CLINIC_OR_DEPARTMENT_OTHER): Payer: Medicare Other

## 2015-06-22 ENCOUNTER — Telehealth: Payer: Self-pay | Admitting: *Deleted

## 2015-06-22 ENCOUNTER — Ambulatory Visit (HOSPITAL_BASED_OUTPATIENT_CLINIC_OR_DEPARTMENT_OTHER): Payer: Medicare Other

## 2015-06-22 ENCOUNTER — Telehealth: Payer: Self-pay | Admitting: Hematology and Oncology

## 2015-06-22 ENCOUNTER — Ambulatory Visit (HOSPITAL_BASED_OUTPATIENT_CLINIC_OR_DEPARTMENT_OTHER): Payer: Medicare Other | Admitting: Hematology and Oncology

## 2015-06-22 VITALS — BP 112/63 | HR 72 | Temp 98.2°F | Resp 18

## 2015-06-22 VITALS — BP 115/71 | HR 89 | Temp 98.1°F | Resp 18 | Ht 62.0 in | Wt 190.3 lb

## 2015-06-22 DIAGNOSIS — Z5112 Encounter for antineoplastic immunotherapy: Secondary | ICD-10-CM | POA: Diagnosis not present

## 2015-06-22 DIAGNOSIS — D704 Cyclic neutropenia: Principal | ICD-10-CM

## 2015-06-22 DIAGNOSIS — M0509 Felty's syndrome, multiple sites: Secondary | ICD-10-CM

## 2015-06-22 DIAGNOSIS — D708 Other neutropenia: Secondary | ICD-10-CM

## 2015-06-22 DIAGNOSIS — M359 Systemic involvement of connective tissue, unspecified: Secondary | ICD-10-CM

## 2015-06-22 LAB — CBC WITH DIFFERENTIAL/PLATELET
BASO%: 0.4 % (ref 0.0–2.0)
Basophils Absolute: 0 10*3/uL (ref 0.0–0.1)
EOS%: 0.1 % (ref 0.0–7.0)
Eosinophils Absolute: 0 10*3/uL (ref 0.0–0.5)
HEMATOCRIT: 42.5 % (ref 34.8–46.6)
HGB: 14.6 g/dL (ref 11.6–15.9)
LYMPH#: 1.4 10*3/uL (ref 0.9–3.3)
LYMPH%: 18.9 % (ref 14.0–49.7)
MCH: 30.8 pg (ref 25.1–34.0)
MCHC: 34.4 g/dL (ref 31.5–36.0)
MCV: 89.4 fL (ref 79.5–101.0)
MONO#: 0.4 10*3/uL (ref 0.1–0.9)
MONO%: 5 % (ref 0.0–14.0)
NEUT%: 75.6 % (ref 38.4–76.8)
NEUTROS ABS: 5.5 10*3/uL (ref 1.5–6.5)
Platelets: 179 10*3/uL (ref 145–400)
RBC: 4.76 10*6/uL (ref 3.70–5.45)
RDW: 13.1 % (ref 11.2–14.5)
WBC: 7.3 10*3/uL (ref 3.9–10.3)

## 2015-06-22 MED ORDER — DIPHENHYDRAMINE HCL 25 MG PO CAPS
50.0000 mg | ORAL_CAPSULE | Freq: Once | ORAL | Status: AC
  Administered 2015-06-22: 50 mg via ORAL

## 2015-06-22 MED ORDER — RITUXIMAB CHEMO INJECTION 500 MG/50ML
375.0000 mg/m2 | Freq: Once | INTRAVENOUS | Status: AC
  Administered 2015-06-22: 700 mg via INTRAVENOUS
  Filled 2015-06-22: qty 60

## 2015-06-22 MED ORDER — SODIUM CHLORIDE 0.9 % IV SOLN
Freq: Once | INTRAVENOUS | Status: AC
  Administered 2015-06-22: 13:00:00 via INTRAVENOUS

## 2015-06-22 MED ORDER — DIPHENHYDRAMINE HCL 25 MG PO CAPS
ORAL_CAPSULE | ORAL | Status: AC
  Filled 2015-06-22: qty 2

## 2015-06-22 MED ORDER — ACETAMINOPHEN 325 MG PO TABS
650.0000 mg | ORAL_TABLET | Freq: Once | ORAL | Status: AC
  Administered 2015-06-22: 650 mg via ORAL

## 2015-06-22 MED ORDER — ACETAMINOPHEN 325 MG PO TABS
ORAL_TABLET | ORAL | Status: AC
  Filled 2015-06-22: qty 2

## 2015-06-22 NOTE — Progress Notes (Signed)
[place], MD SUMMARY OF HEMATOLOGIC HISTORY:  This patient had been followed here for many years. 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 Bone marrow aspirate and biopsy was performed in 2002 and 2004 with no abnormalities found. The patient was treated with G-CSF with good response. She was subsequently started on methotrexate weekly along with prednisone.  On 09/09/2013, methotrexate was discontinued due to severe neutropenia. She was started on G-CSF injection daily. From 10/14/2013 to 11/04/13, she was started on weekly rituximab. In October 2015, her neutropenia has relapsed. From 04/07/2014 to 04/28/14, she received rituximab again. From 05/31/14, Rituximab is restarted and is given on a monthly basis In June 2016, we were able to reduce prednisone down to 5 mg daily Starting 06/22/2015, we switch rituximab to every 8 weeks INTERVAL HISTORY: [name] 65 y.o. female returns for further follow-up. She feels well. Her arthritis pain is under control with 5 mg of prednisone. She denies recent infection. She shared with me that her daughter and her sister were recently diagnosed with cancer.  I have reviewed the past medical history, past surgical history, social history and family history with the patient and they are unchanged from previous note.  ALLERGIES:  is allergic to penicillins.  MEDICATIONS:  Current Outpatient Prescriptions  Medication Sig Dispense Refill  . Alum & Mag Hydroxide-Simeth (MAGIC MOUTHWASH W/LIDOCAINE) SOLN Take 5 mLs by mouth 3 (three) times daily as needed. 240 mL 0  . Ascorbic Acid (VITAMIN C) 1000 MG tablet Take 1,000 mg by mouth daily.    . calcium carbonate (CALCIUM 600) 600 MG TABS Take 600 mg by mouth 2 (two) times daily with a meal.      . Cholecalciferol (VITAMIN D) 1000 UNITS capsule Take 1,000 Units by mouth daily.     .  cyclobenzaprine (FLEXERIL) 10 MG tablet Take 1 tablet (10 mg total) by mouth 3 (three) times daily as needed for muscle spasms. 90 tablet 2  . diclofenac sodium (VOLTAREN) 1 % GEL Apply 2 g topically 4 (four) times daily. 3 Tube 2  . DULoxetine (CYMBALTA) 30 MG capsule Take 30 mg by mouth 2 (two) times daily.     Marland Kitchen HYDROcodone-acetaminophen (NORCO) 7.5-325 MG per tablet Take 1 tablet by mouth every 4 (four) hours as needed. Max 6 per day 180 tablet 0  . Melatonin 10 MG CAPS Take 5 mg by mouth at bedtime.     . nortriptyline (PAMELOR) 50 MG capsule TAKE 2 CAPSULES BY MOUTH AT BEDTIME 180 capsule 0  . omeprazole (PRILOSEC) 20 MG capsule Take 20 mg by mouth daily.     . predniSONE (DELTASONE) 5 MG tablet TAKE 1 AND 1/2 TABLETS BY MOUTH EVERY MORNING WITH BREAKFAST 90 tablet 0  . pregabalin (LYRICA) 200 MG capsule TAKE 1 CAPSULE BY MOUTH TWICE DAILY  *90 day supply* 180 capsule 0  . Probiotic Product (PROBIOTIC FORMULA) CAPS Take 1 capsule by mouth daily.      . vitamin B-12 (CYANOCOBALAMIN) 1000 MCG tablet Take 1,000 mcg by mouth every other day.     . zinc gluconate 50 MG tablet Take 100 mg by mouth daily.    . [DISCONTINUED] POTASSIUM PO Take 20 mEq by mouth 2 (two) times daily.     No current facility-administered medications for this visit.     REVIEW OF SYSTEMS:   Constitutional: Denies fevers, chills or night sweats Eyes: Denies blurriness of vision  Ears, nose, mouth, throat, and face: Denies mucositis or sore throat Respiratory: Denies cough, dyspnea or wheezes Cardiovascular: Denies palpitation, chest discomfort or lower extremity swelling Gastrointestinal:  Denies nausea, heartburn or change in bowel habits Skin: Denies abnormal skin rashes Lymphatics: Denies new lymphadenopathy or easy bruising Neurological:Denies numbness, tingling or new weaknesses Behavioral/Psych: Mood is stable, no new changes  All other systems were reviewed with the patient and are negative.  PHYSICAL  EXAMINATION: ECOG PERFORMANCE STATUS: 0 - Asymptomatic GENERAL:alert, no distress and comfortable SKIN: skin color, texture, turgor are normal, no rashes or significant lesions EYES: normal, Conjunctiva are pink and non-injected, sclera clear OROPHARYNX:no exudate, no erythema and lips, buccal mucosa, and tongue normal  NECK: supple, thyroid normal size, non-tender, without nodularity LYMPH:  no palpable lymphadenopathy in the cervical, axillary or inguinal LUNGS: clear to auscultation and percussion with normal breathing effort HEART: regular rate & rhythm and no murmurs and no lower extremity edema ABDOMEN:abdomen soft, non-tender and normal bowel sounds Musculoskeletal:no cyanosis of digits and no clubbing  NEURO: alert & oriented x 3 with fluent speech, no focal motor/sensory deficits  LABORATORY DATA:  I have reviewed the data as listed Results for orders placed or performed in visit on 06/22/15 (from the past 48 hour(s))  CBC with Differential/Platelet     Status: None   Collection Time: 06/22/15 11:01 AM  Result Value Ref Range   WBC 7.3 3.9 - 10.3 10e3/uL   NEUT# 5.5 1.5 - 6.5 10e3/uL   HGB 14.6 11.6 - 15.9 g/dL   HCT 42.5 34.8 - 46.6 %   Platelets 179 145 - 400 10e3/uL   MCV 89.4 79.5 - 101.0 fL   MCH 30.8 25.1 - 34.0 pg   MCHC 34.4 31.5 - 36.0 g/dL   RBC 4.76 3.70 - 5.45 10e6/uL   RDW 13.1 11.2 - 14.5 %   lymph# 1.4 0.9 - 3.3 10e3/uL   MONO# 0.4 0.1 - 0.9 10e3/uL   Eosinophils Absolute 0.0 0.0 - 0.5 10e3/uL   Basophils Absolute 0.0 0.0 - 0.1 10e3/uL   NEUT% 75.6 38.4 - 76.8 %   LYMPH% 18.9 14.0 - 49.7 %   MONO% 5.0 0.0 - 14.0 %   EOS% 0.1 0.0 - 7.0 %   BASO% 0.4 0.0 - 2.0 %    Lab Results  Component Value Date   WBC 7.3 06/22/2015   HGB 14.6 06/22/2015   HCT 42.5 06/22/2015   MCV 89.4 06/22/2015   PLT 179 06/22/2015   ASSESSMENT & PLAN:  Neutropenia associated with autoimmune disease She will get GCSF injection whenever ANC <1000. She is not symptomatic.  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 In the meantime, I would recommend she continue prednisone at 5 mg daily Since she is doing well, I recommend lengthening the duration between treatment of rituximab to every 8 weeks.    All questions were answered. The patient knows to call the clinic with any problems, questions or concerns. No barriers to learning was detected.  I spent 15 minutes counseling the patient face to face. The total time spent in the appointment was 20 minutes and more than 50% was on counseling.     [facility], Hennie Gosa, MD 1/13/201711:37 AM

## 2015-06-22 NOTE — Assessment & Plan Note (Signed)
She will get GCSF injection whenever ANC <1000. She is not symptomatic. She will continue on rituximab monthly and prednisone

## 2015-06-22 NOTE — Patient Instructions (Signed)
[facility] Discharge Instructions for Patients Receiving Chemotherapy  Today you received the following chemotherapy agents: Rituxan   To help prevent nausea and vomiting after your treatment, we encourage you to take your nausea medication as directed.    If you develop nausea and vomiting that is not controlled by your nausea medication, call the clinic.   BELOW ARE SYMPTOMS THAT SHOULD BE REPORTED IMMEDIATELY:  *FEVER GREATER THAN 100.5 F  *CHILLS WITH OR WITHOUT FEVER  NAUSEA AND VOMITING THAT IS NOT CONTROLLED WITH YOUR NAUSEA MEDICATION  *UNUSUAL SHORTNESS OF BREATH  *UNUSUAL BRUISING OR BLEEDING  TENDERNESS IN MOUTH AND THROAT WITH OR WITHOUT PRESENCE OF ULCERS  *URINARY PROBLEMS  *BOWEL PROBLEMS  UNUSUAL RASH Items with * indicate a potential emergency and should be followed up as soon as possible.  Feel free to call the clinic you have any questions or concerns. The clinic phone number is [phone].  Please show the CHEMO ALERT CARD at check-in to the Emergency Department and triage nurse.   

## 2015-06-22 NOTE — Telephone Encounter (Signed)
Confirmed appt; received avs

## 2015-06-22 NOTE — Telephone Encounter (Signed)
Per staff message and POF I have scheduled appts. Advised scheduler of appts. JMW  

## 2015-06-22 NOTE — Assessment & Plan Note (Addendum)
She is doing well with monthly rituximab therapy and that is able to control her arthritis and her neutropenia. In the meantime, I would recommend she continue prednisone at 5 mg daily Since she is doing well, I recommend lengthening the duration between treatment of rituximab to every 8 weeks.

## 2015-06-26 DIAGNOSIS — M21961 Unspecified acquired deformity of right lower leg: Secondary | ICD-10-CM | POA: Diagnosis not present

## 2015-06-26 DIAGNOSIS — M7741 Metatarsalgia, right foot: Secondary | ICD-10-CM | POA: Diagnosis not present

## 2015-07-02 DIAGNOSIS — L97922 Non-pressure chronic ulcer of unspecified part of left lower leg with fat layer exposed: Secondary | ICD-10-CM | POA: Diagnosis not present

## 2015-07-02 DIAGNOSIS — S81802A Unspecified open wound, left lower leg, initial encounter: Secondary | ICD-10-CM | POA: Diagnosis not present

## 2015-07-03 DIAGNOSIS — M797 Fibromyalgia: Secondary | ICD-10-CM | POA: Diagnosis not present

## 2015-07-03 DIAGNOSIS — M0589 Other rheumatoid arthritis with rheumatoid factor of multiple sites: Secondary | ICD-10-CM | POA: Diagnosis not present

## 2015-07-03 DIAGNOSIS — M5136 Other intervertebral disc degeneration, lumbar region: Secondary | ICD-10-CM | POA: Diagnosis not present

## 2015-07-03 DIAGNOSIS — M150 Primary generalized (osteo)arthritis: Secondary | ICD-10-CM | POA: Diagnosis not present

## 2015-07-03 DIAGNOSIS — E559 Vitamin D deficiency, unspecified: Secondary | ICD-10-CM | POA: Diagnosis not present

## 2015-07-09 DIAGNOSIS — L97922 Non-pressure chronic ulcer of unspecified part of left lower leg with fat layer exposed: Secondary | ICD-10-CM | POA: Diagnosis not present

## 2015-07-16 DIAGNOSIS — L97922 Non-pressure chronic ulcer of unspecified part of left lower leg with fat layer exposed: Secondary | ICD-10-CM | POA: Diagnosis not present

## 2015-07-23 DIAGNOSIS — L97922 Non-pressure chronic ulcer of unspecified part of left lower leg with fat layer exposed: Secondary | ICD-10-CM | POA: Diagnosis not present

## 2015-07-30 DIAGNOSIS — L97922 Non-pressure chronic ulcer of unspecified part of left lower leg with fat layer exposed: Secondary | ICD-10-CM | POA: Diagnosis not present

## 2015-08-17 ENCOUNTER — Ambulatory Visit (HOSPITAL_BASED_OUTPATIENT_CLINIC_OR_DEPARTMENT_OTHER): Payer: Medicare Other

## 2015-08-17 ENCOUNTER — Other Ambulatory Visit: Payer: Self-pay | Admitting: Hematology and Oncology

## 2015-08-17 ENCOUNTER — Telehealth: Payer: Self-pay | Admitting: Hematology and Oncology

## 2015-08-17 ENCOUNTER — Ambulatory Visit (HOSPITAL_BASED_OUTPATIENT_CLINIC_OR_DEPARTMENT_OTHER): Payer: Medicare Other | Admitting: Hematology and Oncology

## 2015-08-17 ENCOUNTER — Other Ambulatory Visit (HOSPITAL_BASED_OUTPATIENT_CLINIC_OR_DEPARTMENT_OTHER): Payer: Medicare Other

## 2015-08-17 ENCOUNTER — Encounter: Payer: Self-pay | Admitting: Hematology and Oncology

## 2015-08-17 VITALS — BP 113/62 | HR 79 | Temp 98.2°F | Resp 18

## 2015-08-17 VITALS — BP 128/80 | HR 96 | Temp 98.2°F | Resp 18 | Wt 188.6 lb

## 2015-08-17 DIAGNOSIS — M0509 Felty's syndrome, multiple sites: Secondary | ICD-10-CM

## 2015-08-17 DIAGNOSIS — M359 Systemic involvement of connective tissue, unspecified: Secondary | ICD-10-CM

## 2015-08-17 DIAGNOSIS — M059 Rheumatoid arthritis with rheumatoid factor, unspecified: Secondary | ICD-10-CM | POA: Diagnosis not present

## 2015-08-17 DIAGNOSIS — D708 Other neutropenia: Secondary | ICD-10-CM

## 2015-08-17 DIAGNOSIS — D704 Cyclic neutropenia: Secondary | ICD-10-CM

## 2015-08-17 DIAGNOSIS — Z5112 Encounter for antineoplastic immunotherapy: Secondary | ICD-10-CM | POA: Diagnosis not present

## 2015-08-17 LAB — CBC WITH DIFFERENTIAL/PLATELET
BASO%: 0.4 % (ref 0.0–2.0)
BASOS ABS: 0 10*3/uL (ref 0.0–0.1)
EOS%: 0.4 % (ref 0.0–7.0)
Eosinophils Absolute: 0 10*3/uL (ref 0.0–0.5)
HEMATOCRIT: 42.8 % (ref 34.8–46.6)
HEMOGLOBIN: 14.8 g/dL (ref 11.6–15.9)
LYMPH#: 1 10*3/uL (ref 0.9–3.3)
LYMPH%: 17.6 % (ref 14.0–49.7)
MCH: 30.7 pg (ref 25.1–34.0)
MCHC: 34.7 g/dL (ref 31.5–36.0)
MCV: 88.6 fL (ref 79.5–101.0)
MONO#: 0.4 10*3/uL (ref 0.1–0.9)
MONO%: 6.8 % (ref 0.0–14.0)
NEUT#: 4.5 10*3/uL (ref 1.5–6.5)
NEUT%: 74.8 % (ref 38.4–76.8)
PLATELETS: 172 10*3/uL (ref 145–400)
RBC: 4.83 10*6/uL (ref 3.70–5.45)
RDW: 13.7 % (ref 11.2–14.5)
WBC: 6 10*3/uL (ref 3.9–10.3)

## 2015-08-17 MED ORDER — PREDNISONE 5 MG PO TABS
5.0000 mg | ORAL_TABLET | Freq: Every day | ORAL | Status: DC
Start: 2015-08-17 — End: 2016-01-08

## 2015-08-17 MED ORDER — DIPHENHYDRAMINE HCL 25 MG PO CAPS
ORAL_CAPSULE | ORAL | Status: AC
  Filled 2015-08-17: qty 2

## 2015-08-17 MED ORDER — ACETAMINOPHEN 325 MG PO TABS
ORAL_TABLET | ORAL | Status: AC
Start: 2015-08-17 — End: 2015-08-17
  Filled 2015-08-17: qty 2

## 2015-08-17 MED ORDER — SODIUM CHLORIDE 0.9 % IV SOLN
375.0000 mg/m2 | Freq: Once | INTRAVENOUS | Status: AC
  Administered 2015-08-17: 700 mg via INTRAVENOUS
  Filled 2015-08-17: qty 60

## 2015-08-17 MED ORDER — DIPHENHYDRAMINE HCL 25 MG PO CAPS
50.0000 mg | ORAL_CAPSULE | Freq: Once | ORAL | Status: AC
  Administered 2015-08-17: 50 mg via ORAL

## 2015-08-17 MED ORDER — ACETAMINOPHEN 325 MG PO TABS
650.0000 mg | ORAL_TABLET | Freq: Once | ORAL | Status: AC
  Administered 2015-08-17: 650 mg via ORAL

## 2015-08-17 MED ORDER — SODIUM CHLORIDE 0.9 % IV SOLN
Freq: Once | INTRAVENOUS | Status: AC
  Administered 2015-08-17: 12:00:00 via INTRAVENOUS

## 2015-08-17 NOTE — Progress Notes (Signed)
[place], MD SUMMARY OF HEMATOLOGIC HISTORY:  This patient had been followed here for many years. 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 Bone marrow aspirate and biopsy was performed in 2002 and 2004 with no abnormalities found. The patient was treated with G-CSF with good response. She was subsequently started on methotrexate weekly along with prednisone.  On 09/09/2013, methotrexate was discontinued due to severe neutropenia. She was started on G-CSF injection daily. From 10/14/2013 to 11/04/13, she was started on weekly rituximab. In October 2015, her neutropenia has relapsed. From 04/07/2014 to 04/28/14, she received rituximab again. From 05/31/14, Rituximab is restarted and is given on a monthly basis In June 2016, we were able to reduce prednisone down to 5 mg daily Starting 06/22/2015, we switch rituximab to every 8 weeks Starting 08/17/2015, I switch rituximab to every 12 weeks INTERVAL HISTORY: [name] 65 y.o. female returns for further follow-up. She feels well. Her arthritis appeared to be under control with rituximab and prednisone. She denies recent infection.   I have reviewed the past medical history, past surgical history, social history and family history with the patient and they are unchanged from previous note.  ALLERGIES:  is allergic to penicillins.  MEDICATIONS:  Current Outpatient Prescriptions  Medication Sig Dispense Refill  . Alum & Mag Hydroxide-Simeth (MAGIC MOUTHWASH W/LIDOCAINE) SOLN Take 5 mLs by mouth 3 (three) times daily as needed. 240 mL 0  . Ascorbic Acid (VITAMIN C) 1000 MG tablet Take 1,000 mg by mouth daily.    . calcium carbonate (CALCIUM 600) 600 MG TABS Take 600 mg by mouth 2 (two) times daily with a meal.      . Cholecalciferol (VITAMIN D) 1000 UNITS capsule Take 1,000 Units by mouth daily.     . cyclobenzaprine  (FLEXERIL) 10 MG tablet Take 1 tablet (10 mg total) by mouth 3 (three) times daily as needed for muscle spasms. 90 tablet 2  . diclofenac sodium (VOLTAREN) 1 % GEL Apply 2 g topically 4 (four) times daily. 3 Tube 2  . DULoxetine (CYMBALTA) 30 MG capsule Take 30 mg by mouth 2 (two) times daily.     Marland Kitchen HYDROcodone-acetaminophen (NORCO) 7.5-325 MG per tablet Take 1 tablet by mouth every 4 (four) hours as needed. Max 6 per day 180 tablet 0  . Melatonin 10 MG CAPS Take 5 mg by mouth at bedtime.     . nortriptyline (PAMELOR) 50 MG capsule TAKE 2 CAPSULES BY MOUTH AT BEDTIME 180 capsule 0  . omeprazole (PRILOSEC) 20 MG capsule Take 20 mg by mouth daily.     . predniSONE (DELTASONE) 5 MG tablet Take 1 tablet (5 mg total) by mouth daily with breakfast. 90 tablet 0  . pregabalin (LYRICA) 200 MG capsule TAKE 1 CAPSULE BY MOUTH TWICE DAILY  *90 day supply* 180 capsule 0  . Probiotic Product (PROBIOTIC FORMULA) CAPS Take 1 capsule by mouth daily.      . vitamin B-12 (CYANOCOBALAMIN) 1000 MCG tablet Take 1,000 mcg by mouth every other day.     . zinc gluconate 50 MG tablet Take 100 mg by mouth daily.    . [DISCONTINUED] POTASSIUM PO Take 20 mEq by mouth 2 (two) times daily.     No current facility-administered medications for this visit.     REVIEW OF SYSTEMS:   Constitutional: Denies fevers, chills or night sweats Eyes: Denies blurriness of vision Ears, nose, mouth, throat, and  face: Denies mucositis or sore throat Respiratory: Denies cough, dyspnea or wheezes Cardiovascular: Denies palpitation, chest discomfort or lower extremity swelling Gastrointestinal:  Denies nausea, heartburn or change in bowel habits Skin: Denies abnormal skin rashes Lymphatics: Denies new lymphadenopathy or easy bruising Neurological:Denies numbness, tingling or new weaknesses Behavioral/Psych: Mood is stable, no new changes  All other systems were reviewed with the patient and are negative.  PHYSICAL EXAMINATION: ECOG  PERFORMANCE STATUS: 0 - Asymptomatic  Filed Vitals:   08/17/15 1026  BP: 128/80  Pulse: 96  Temp: 98.2 F (36.8 C)  Resp: 18   Filed Weights   08/17/15 1026  Weight: 188 lb 9.6 oz (85.548 kg)    GENERAL:alert, no distress and comfortable SKIN: skin color, texture, turgor are normal, no rashes or significant lesions EYES: normal, Conjunctiva are pink and non-injected, sclera clear OROPHARYNX:no exudate, no erythema and lips, buccal mucosa, and tongue normal  NECK: supple, thyroid normal size, non-tender, without nodularity LYMPH:  no palpable lymphadenopathy in the cervical, axillary or inguinal LUNGS: clear to auscultation and percussion with normal breathing effort HEART: regular rate & rhythm and no murmurs and no lower extremity edema ABDOMEN:abdomen soft, non-tender and normal bowel sounds Musculoskeletal:no cyanosis of digits and no clubbing . Noted joints changed in her small hands NEURO: alert & oriented x 3 with fluent speech, no focal motor/sensory deficits  LABORATORY DATA:  I have reviewed the data as listed Results for orders placed or performed in visit on 08/17/15 (from the past 48 hour(s))  CBC with Differential/Platelet     Status: None   Collection Time: 08/17/15 10:18 AM  Result Value Ref Range   WBC 6.0 3.9 - 10.3 10e3/uL   NEUT# 4.5 1.5 - 6.5 10e3/uL   HGB 14.8 11.6 - 15.9 g/dL   HCT 42.8 34.8 - 46.6 %   Platelets 172 145 - 400 10e3/uL   MCV 88.6 79.5 - 101.0 fL   MCH 30.7 25.1 - 34.0 pg   MCHC 34.7 31.5 - 36.0 g/dL   RBC 4.83 3.70 - 5.45 10e6/uL   RDW 13.7 11.2 - 14.5 %   lymph# 1.0 0.9 - 3.3 10e3/uL   MONO# 0.4 0.1 - 0.9 10e3/uL   Eosinophils Absolute 0.0 0.0 - 0.5 10e3/uL   Basophils Absolute 0.0 0.0 - 0.1 10e3/uL   NEUT% 74.8 38.4 - 76.8 %   LYMPH% 17.6 14.0 - 49.7 %   MONO% 6.8 0.0 - 14.0 %   EOS% 0.4 0.0 - 7.0 %   BASO% 0.4 0.0 - 2.0 %    Lab Results  Component Value Date   WBC 6.0 08/17/2015   HGB 14.8 08/17/2015   HCT 42.8  08/17/2015   MCV 88.6 08/17/2015   PLT 172 08/17/2015   ASSESSMENT & PLAN:  Neutropenia associated with autoimmune disease She is not symptomatic. She will continue on rituximab monthly and prednisone I plan to space out the rituximab every 3 months.  Rheumatoid arthritis, seropositive ([employer]) She is doing well with monthly rituximab therapy and that is able to control her arthritis and her neutropenia. In the meantime, I would recommend she continue prednisone at 5 mg daily Since she is doing well, I recommend lengthening the duration between treatment of rituximab to every 12 weeks.      All questions were answered. The patient knows to call the clinic with any problems, questions or concerns. No barriers to learning was detected.  I spent 15 minutes counseling the patient face to face. The total  time spent in the appointment was 20 minutes and more than 50% was on counseling.     [facility], Shakeita Vandevander, MD 3/10/201711:06 AM

## 2015-08-17 NOTE — Telephone Encounter (Signed)
per pof to sch pt appt-gave pt copy of avs °

## 2015-08-17 NOTE — Assessment & Plan Note (Signed)
She is doing well with monthly rituximab therapy and that is able to control her arthritis and her neutropenia. In the meantime, I would recommend she continue prednisone at 5 mg daily Since she is doing well, I recommend lengthening the duration between treatment of rituximab to every 12 weeks.

## 2015-08-17 NOTE — Patient Instructions (Signed)
[facility] Discharge Instructions for Patients Receiving Chemotherapy  Today you received the following chemotherapy agents: Rituxan  To help prevent nausea and vomiting after your treatment, we encourage you to take your nausea medication as prescribed by your physician.   If you develop nausea and vomiting that is not controlled by your nausea medication, call the clinic.   BELOW ARE SYMPTOMS THAT SHOULD BE REPORTED IMMEDIATELY:  *FEVER GREATER THAN 100.5 F  *CHILLS WITH OR WITHOUT FEVER  NAUSEA AND VOMITING THAT IS NOT CONTROLLED WITH YOUR NAUSEA MEDICATION  *UNUSUAL SHORTNESS OF BREATH  *UNUSUAL BRUISING OR BLEEDING  TENDERNESS IN MOUTH AND THROAT WITH OR WITHOUT PRESENCE OF ULCERS  *URINARY PROBLEMS  *BOWEL PROBLEMS  UNUSUAL RASH Items with * indicate a potential emergency and should be followed up as soon as possible.  Feel free to call the clinic you have any questions or concerns. The clinic phone number is [phone].  Please show the CHEMO ALERT CARD at check-in to the Emergency Department and triage nurse.   

## 2015-08-17 NOTE — Assessment & Plan Note (Addendum)
She is not symptomatic. She will continue on rituximab monthly and prednisone I plan to space out the rituximab every 3 months.

## 2015-09-18 DIAGNOSIS — Z1231 Encounter for screening mammogram for malignant neoplasm of breast: Secondary | ICD-10-CM | POA: Diagnosis not present

## 2015-10-22 DIAGNOSIS — J209 Acute bronchitis, unspecified: Secondary | ICD-10-CM | POA: Diagnosis not present

## 2015-11-01 DIAGNOSIS — E559 Vitamin D deficiency, unspecified: Secondary | ICD-10-CM | POA: Diagnosis not present

## 2015-11-01 DIAGNOSIS — M150 Primary generalized (osteo)arthritis: Secondary | ICD-10-CM | POA: Diagnosis not present

## 2015-11-01 DIAGNOSIS — M5136 Other intervertebral disc degeneration, lumbar region: Secondary | ICD-10-CM | POA: Diagnosis not present

## 2015-11-01 DIAGNOSIS — M797 Fibromyalgia: Secondary | ICD-10-CM | POA: Diagnosis not present

## 2015-11-01 DIAGNOSIS — M0589 Other rheumatoid arthritis with rheumatoid factor of multiple sites: Secondary | ICD-10-CM | POA: Diagnosis not present

## 2015-11-06 ENCOUNTER — Telehealth: Payer: Self-pay | Admitting: Hematology and Oncology

## 2015-11-06 NOTE — Telephone Encounter (Signed)
Mailed pt medial records to [place]

## 2015-11-16 ENCOUNTER — Encounter: Payer: Self-pay | Admitting: Hematology and Oncology

## 2015-11-16 ENCOUNTER — Ambulatory Visit (HOSPITAL_BASED_OUTPATIENT_CLINIC_OR_DEPARTMENT_OTHER): Payer: Medicare Other | Admitting: Hematology and Oncology

## 2015-11-16 ENCOUNTER — Other Ambulatory Visit (HOSPITAL_BASED_OUTPATIENT_CLINIC_OR_DEPARTMENT_OTHER): Payer: Medicare Other

## 2015-11-16 ENCOUNTER — Telehealth: Payer: Self-pay | Admitting: Hematology and Oncology

## 2015-11-16 ENCOUNTER — Ambulatory Visit (HOSPITAL_BASED_OUTPATIENT_CLINIC_OR_DEPARTMENT_OTHER): Payer: Medicare Other

## 2015-11-16 VITALS — BP 97/56 | HR 73 | Temp 98.1°F | Resp 16

## 2015-11-16 VITALS — BP 103/72 | HR 87 | Temp 98.3°F | Resp 18 | Ht 62.0 in | Wt 183.3 lb

## 2015-11-16 DIAGNOSIS — M359 Systemic involvement of connective tissue, unspecified: Secondary | ICD-10-CM

## 2015-11-16 DIAGNOSIS — Z5112 Encounter for antineoplastic immunotherapy: Secondary | ICD-10-CM | POA: Diagnosis not present

## 2015-11-16 DIAGNOSIS — M0509 Felty's syndrome, multiple sites: Secondary | ICD-10-CM | POA: Diagnosis not present

## 2015-11-16 DIAGNOSIS — D708 Other neutropenia: Secondary | ICD-10-CM

## 2015-11-16 DIAGNOSIS — M059 Rheumatoid arthritis with rheumatoid factor, unspecified: Secondary | ICD-10-CM | POA: Diagnosis not present

## 2015-11-16 DIAGNOSIS — D704 Cyclic neutropenia: Secondary | ICD-10-CM

## 2015-11-16 LAB — CBC WITH DIFFERENTIAL/PLATELET
BASO%: 0.5 % (ref 0.0–2.0)
Basophils Absolute: 0 10*3/uL (ref 0.0–0.1)
EOS%: 0.5 % (ref 0.0–7.0)
Eosinophils Absolute: 0 10*3/uL (ref 0.0–0.5)
HEMATOCRIT: 42.7 % (ref 34.8–46.6)
HEMOGLOBIN: 14.6 g/dL (ref 11.6–15.9)
LYMPH#: 0.8 10*3/uL — AB (ref 0.9–3.3)
LYMPH%: 18.5 % (ref 14.0–49.7)
MCH: 30.9 pg (ref 25.1–34.0)
MCHC: 34.1 g/dL (ref 31.5–36.0)
MCV: 90.7 fL (ref 79.5–101.0)
MONO#: 0.3 10*3/uL (ref 0.1–0.9)
MONO%: 7.9 % (ref 0.0–14.0)
NEUT%: 72.6 % (ref 38.4–76.8)
NEUTROS ABS: 3.2 10*3/uL (ref 1.5–6.5)
Platelets: 157 10*3/uL (ref 145–400)
RBC: 4.71 10*6/uL (ref 3.70–5.45)
RDW: 13.8 % (ref 11.2–14.5)
WBC: 4.4 10*3/uL (ref 3.9–10.3)

## 2015-11-16 MED ORDER — DIPHENHYDRAMINE HCL 25 MG PO CAPS
ORAL_CAPSULE | ORAL | Status: AC
  Filled 2015-11-16: qty 2

## 2015-11-16 MED ORDER — ACETAMINOPHEN 325 MG PO TABS
ORAL_TABLET | ORAL | Status: AC
Start: 2015-11-16 — End: 2015-11-16
  Filled 2015-11-16: qty 2

## 2015-11-16 MED ORDER — DIPHENHYDRAMINE HCL 25 MG PO CAPS
50.0000 mg | ORAL_CAPSULE | Freq: Once | ORAL | Status: AC
  Administered 2015-11-16: 50 mg via ORAL

## 2015-11-16 MED ORDER — ACETAMINOPHEN 325 MG PO TABS
650.0000 mg | ORAL_TABLET | Freq: Once | ORAL | Status: AC
  Administered 2015-11-16: 650 mg via ORAL

## 2015-11-16 MED ORDER — SODIUM CHLORIDE 0.9 % IV SOLN
375.0000 mg/m2 | Freq: Once | INTRAVENOUS | Status: AC
  Administered 2015-11-16: 700 mg via INTRAVENOUS
  Filled 2015-11-16: qty 60

## 2015-11-16 MED ORDER — SODIUM CHLORIDE 0.9 % IV SOLN
Freq: Once | INTRAVENOUS | Status: AC
  Administered 2015-11-16: 11:00:00 via INTRAVENOUS

## 2015-11-16 NOTE — Progress Notes (Signed)
Fraser, MD SUMMARY OF HEMATOLOGIC HISTORY:  This patient had been followed here for many years. 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 Bone marrow aspirate and biopsy was performed in 2002 and 2004 with no abnormalities found. The patient was treated with G-CSF with good response. She was subsequently started on methotrexate weekly along with prednisone.  On 09/09/2013, methotrexate was discontinued due to severe neutropenia. She was started on G-CSF injection daily. From 10/14/2013 to 11/04/13, she was started on weekly rituximab. In October 2015, her neutropenia has relapsed. From 04/07/2014 to 04/28/14, she received rituximab again. From 05/31/14, Rituximab is restarted and is given on a monthly basis From 2016 to 2017, the rituximab dose was spaced out further.  11/16/2015, decision was made to stop rituximab  INTERVAL HISTORY: [name] 65 y.o. female returns for further follow-up. She feels well. Her rheumatoid arthritis is well controlled with current dose of prednisone and rituximab She is traveling to take care of her husband with Alzheimer's and her sick mother. She had one episode of sinus infection resolved with antibiotic therapy since I saw her  I have reviewed the past medical history, past surgical history, social history and family history with the patient and they are unchanged from previous note.  ALLERGIES:  is allergic to penicillins.  MEDICATIONS:  Current Outpatient Prescriptions  Medication Sig Dispense Refill  . Alum & Mag Hydroxide-Simeth (MAGIC MOUTHWASH W/LIDOCAINE) SOLN Take 5 mLs by mouth 3 (three) times daily as needed. 240 mL 0  . Ascorbic Acid (VITAMIN C) 1000 MG tablet Take 1,000 mg by mouth daily.    . calcium carbonate (CALCIUM 600) 600 MG TABS Take 600 mg by mouth 2 (two) times daily with a meal.      . Cholecalciferol (VITAMIN  D) 1000 UNITS capsule Take 1,000 Units by mouth daily.     . cyclobenzaprine (FLEXERIL) 10 MG tablet Take 1 tablet (10 mg total) by mouth 3 (three) times daily as needed for muscle spasms. 90 tablet 2  . diclofenac sodium (VOLTAREN) 1 % GEL Apply 2 g topically 4 (four) times daily. 3 Tube 2  . DULoxetine (CYMBALTA) 30 MG capsule Take 30 mg by mouth 2 (two) times daily.     Marland Kitchen HYDROcodone-acetaminophen (NORCO) 7.5-325 MG per tablet Take 1 tablet by mouth every 4 (four) hours as needed. Max 6 per day 180 tablet 0  . Melatonin 10 MG CAPS Take 5 mg by mouth at bedtime.     . nortriptyline (PAMELOR) 50 MG capsule TAKE 2 CAPSULES BY MOUTH AT BEDTIME 180 capsule 0  . omeprazole (PRILOSEC) 20 MG capsule Take 20 mg by mouth daily.     . predniSONE (DELTASONE) 5 MG tablet Take 1 tablet (5 mg total) by mouth daily with breakfast. 90 tablet 0  . pregabalin (LYRICA) 200 MG capsule TAKE 1 CAPSULE BY MOUTH TWICE DAILY  *90 day supply* 180 capsule 0  . Probiotic Product (PROBIOTIC FORMULA) CAPS Take 1 capsule by mouth daily.      . vitamin B-12 (CYANOCOBALAMIN) 1000 MCG tablet Take 1,000 mcg by mouth every other day.     . zinc gluconate 50 MG tablet Take 100 mg by mouth daily.     No current facility-administered medications for this visit.     REVIEW OF SYSTEMS:   Constitutional: Denies fevers, chills or night sweats Eyes: Denies blurriness of vision Ears, nose, mouth, throat, and face:  Denies mucositis or sore throat Respiratory: Denies cough, dyspnea or wheezes Cardiovascular: Denies palpitation, chest discomfort or lower extremity swelling Gastrointestinal:  Denies nausea, heartburn or change in bowel habits Skin: Denies abnormal skin rashes Lymphatics: Denies new lymphadenopathy or easy bruising Neurological:Denies numbness, tingling or new weaknesses Behavioral/Psych: Mood is stable, no new changes  All other systems were reviewed with the patient and are negative.  PHYSICAL EXAMINATION: ECOG  PERFORMANCE STATUS: 0 - Asymptomatic  Filed Vitals:   11/16/15 1005  BP: 103/72  Pulse: 87  Temp: 98.3 F (36.8 C)  Resp: 18   Filed Weights   11/16/15 1005  Weight: 183 lb 4.8 oz (83.144 kg)    GENERAL:alert, no distress and comfortable. She looks mildly cushingoid SKIN: skin color, texture, turgor are normal, no rashes or significant lesions EYES: normal, Conjunctiva are pink and non-injected, sclera clear Musculoskeletal:no cyanosis of digits and no clubbing. 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 NEURO: alert & oriented x 3 with fluent speech, no focal motor/sensory deficits  LABORATORY DATA:  I have reviewed the data as listed     Component Value Date/Time   NA 145 11/03/2014 1048   NA 140 10/16/2011 1338   K 3.5 11/03/2014 1048   K 3.8 10/16/2011 1338   CL 99 10/19/2012 1051   CL 105 10/16/2011 1338   CO2 23 11/03/2014 1048   CO2 26 10/16/2011 1338   GLUCOSE 158* 11/03/2014 1048   GLUCOSE 143* 10/19/2012 1051   GLUCOSE 140* 10/16/2011 1338   BUN 8.1 11/03/2014 1048   BUN 10 10/16/2011 1338   CREATININE 0.8 11/03/2014 1048   CREATININE 0.82 10/16/2011 1338   CALCIUM 8.6 11/03/2014 1048   CALCIUM 9.3 10/16/2011 1338   PROT 7.0 11/03/2014 1048   PROT 7.1 10/16/2011 1338   ALBUMIN 3.7 11/03/2014 1048   ALBUMIN 4.0 10/16/2011 1338   AST 16 11/03/2014 1048   AST 17 10/16/2011 1338   ALT 14 11/03/2014 1048   ALT 19 10/16/2011 1338   ALKPHOS 59 11/03/2014 1048   ALKPHOS 44 10/16/2011 1338   BILITOT 0.41 11/03/2014 1048   BILITOT 0.5 10/16/2011 1338   GFRNONAA >60 03/01/2011 1112   GFRAA >60 03/01/2011 1112    No results found for: SPEP, UPEP  Lab Results  Component Value Date   WBC 4.4 11/16/2015   NEUTROABS 3.2 11/16/2015   HGB 14.6 11/16/2015   HCT 42.7 11/16/2015   MCV 90.7 11/16/2015   PLT 157 11/16/2015      Chemistry      Component Value Date/Time   NA 145 11/03/2014 1048   NA 140 10/16/2011 1338   K 3.5 11/03/2014 1048   K  3.8 10/16/2011 1338   CL 99 10/19/2012 1051   CL 105 10/16/2011 1338   CO2 23 11/03/2014 1048   CO2 26 10/16/2011 1338   BUN 8.1 11/03/2014 1048   BUN 10 10/16/2011 1338   CREATININE 0.8 11/03/2014 1048   CREATININE 0.82 10/16/2011 1338      Component Value Date/Time   CALCIUM 8.6 11/03/2014 1048   CALCIUM 9.3 10/16/2011 1338   ALKPHOS 59 11/03/2014 1048   ALKPHOS 44 10/16/2011 1338   AST 16 11/03/2014 1048   AST 17 10/16/2011 1338   ALT 14 11/03/2014 1048   ALT 19 10/16/2011 1338   BILITOT 0.41 11/03/2014 1048   BILITOT 0.5 10/16/2011 1338       ASSESSMENT & PLAN:  Neutropenia associated with autoimmune disease She is not  symptomatic.  Neutropenia has resolved since we began maintenance treatment. I recommend we start rituximab after today's dose and recheck in 4 months and she agreed with the plan of care   Rheumatoid arthritis, seropositive ([place]) She is doing well with rituximab therapy and that is able to control her arthritis and her neutropenia. In the meantime, I would recommend she continue prednisone at 5 mg daily Since she is doing well, I recommend stopping rituximab as above and she will continue on chronic prednisone therapy. She is on calcium and vitamin D supplement     All questions were answered. The patient knows to call the clinic with any problems, questions or concerns. No barriers to learning was detected.  I spent 15 minutes counseling the patient face to face. The total time spent in the appointment was 20 minutes and more than 50% was on counseling.     [facility], Ashanty Coltrane, MD 6/9/201710:22 AM

## 2015-11-16 NOTE — Assessment & Plan Note (Signed)
She is not symptomatic.  Neutropenia has resolved since we began maintenance treatment. I recommend we start rituximab after today's dose and recheck in 4 months and she agreed with the plan of care

## 2015-11-16 NOTE — Telephone Encounter (Signed)
Gave and printed appt sched and avs fo rpt for OCT °

## 2015-11-16 NOTE — Assessment & Plan Note (Signed)
She is doing well with rituximab therapy and that is able to control her arthritis and her neutropenia. In the meantime, I would recommend she continue prednisone at 5 mg daily Since she is doing well, I recommend stopping rituximab as above and she will continue on chronic prednisone therapy. She is on calcium and vitamin D supplement

## 2015-11-16 NOTE — Patient Instructions (Signed)
[facility] Discharge Instructions for Patients Receiving Chemotherapy  Today you received the following chemotherapy agents: Rituxan  To help prevent nausea and vomiting after your treatment, we encourage you to take your nausea medication as prescribed by your physician.   If you develop nausea and vomiting that is not controlled by your nausea medication, call the clinic.   BELOW ARE SYMPTOMS THAT SHOULD BE REPORTED IMMEDIATELY:  *FEVER GREATER THAN 100.5 F  *CHILLS WITH OR WITHOUT FEVER  NAUSEA AND VOMITING THAT IS NOT CONTROLLED WITH YOUR NAUSEA MEDICATION  *UNUSUAL SHORTNESS OF BREATH  *UNUSUAL BRUISING OR BLEEDING  TENDERNESS IN MOUTH AND THROAT WITH OR WITHOUT PRESENCE OF ULCERS  *URINARY PROBLEMS  *BOWEL PROBLEMS  UNUSUAL RASH Items with * indicate a potential emergency and should be followed up as soon as possible.  Feel free to call the clinic you have any questions or concerns. The clinic phone number is [phone].  Please show the CHEMO ALERT CARD at check-in to the Emergency Department and triage nurse.   

## 2015-11-19 ENCOUNTER — Other Ambulatory Visit: Payer: Self-pay | Admitting: Hematology and Oncology

## 2015-11-26 DIAGNOSIS — Z79899 Other long term (current) drug therapy: Secondary | ICD-10-CM | POA: Diagnosis not present

## 2015-11-26 DIAGNOSIS — M069 Rheumatoid arthritis, unspecified: Secondary | ICD-10-CM | POA: Diagnosis not present

## 2015-11-26 DIAGNOSIS — M0500 Felty's syndrome, unspecified site: Secondary | ICD-10-CM | POA: Diagnosis not present

## 2015-11-26 DIAGNOSIS — Z7952 Long term (current) use of systemic steroids: Secondary | ICD-10-CM | POA: Diagnosis not present

## 2015-11-26 DIAGNOSIS — M797 Fibromyalgia: Secondary | ICD-10-CM | POA: Diagnosis not present

## 2015-12-27 DIAGNOSIS — M0500 Felty's syndrome, unspecified site: Secondary | ICD-10-CM | POA: Diagnosis not present

## 2015-12-27 DIAGNOSIS — R1032 Left lower quadrant pain: Secondary | ICD-10-CM | POA: Diagnosis not present

## 2015-12-27 DIAGNOSIS — M069 Rheumatoid arthritis, unspecified: Secondary | ICD-10-CM | POA: Diagnosis not present

## 2015-12-27 DIAGNOSIS — K5732 Diverticulitis of large intestine without perforation or abscess without bleeding: Secondary | ICD-10-CM | POA: Diagnosis not present

## 2016-01-08 ENCOUNTER — Other Ambulatory Visit: Payer: Self-pay | Admitting: Hematology and Oncology

## 2016-01-29 DIAGNOSIS — Z0000 Encounter for general adult medical examination without abnormal findings: Secondary | ICD-10-CM | POA: Diagnosis not present

## 2016-03-14 ENCOUNTER — Ambulatory Visit: Payer: Medicare Other | Admitting: Hematology and Oncology

## 2016-03-14 ENCOUNTER — Other Ambulatory Visit: Payer: Medicare Other

## 2016-03-14 NOTE — Assessment & Plan Note (Deleted)
She is doing well withrituximab therapy for 2 years In the meantime, I would recommend she continue prednisone at 5 mg daily

## 2016-03-14 NOTE — Assessment & Plan Note (Deleted)
She is doing well with rituximab therapy and that is able to control her arthritis and her neutropenia. In the meantime, I would recommend she continue prednisone at 5 mg daily Since she is doing well, I recommend stopping rituximab as above and she will continue on chronic prednisone therapy. She is on calcium and vitamin D supplement

## 2016-03-28 ENCOUNTER — Telehealth: Payer: Self-pay | Admitting: Hematology and Oncology

## 2016-03-28 NOTE — Telephone Encounter (Signed)
10/06 Appointment rescheduled to 11/17 per patient request. The patient is available on Fridays per work schedule.

## 2016-04-01 DIAGNOSIS — L97222 Non-pressure chronic ulcer of left calf with fat layer exposed: Secondary | ICD-10-CM | POA: Diagnosis not present

## 2016-04-08 DIAGNOSIS — L97222 Non-pressure chronic ulcer of left calf with fat layer exposed: Secondary | ICD-10-CM | POA: Diagnosis not present

## 2016-04-10 ENCOUNTER — Other Ambulatory Visit: Payer: Self-pay | Admitting: Hematology and Oncology

## 2016-04-11 DIAGNOSIS — Z23 Encounter for immunization: Secondary | ICD-10-CM | POA: Diagnosis not present

## 2016-04-25 ENCOUNTER — Encounter: Payer: Self-pay | Admitting: Hematology and Oncology

## 2016-04-25 ENCOUNTER — Other Ambulatory Visit (HOSPITAL_BASED_OUTPATIENT_CLINIC_OR_DEPARTMENT_OTHER): Payer: Medicare Other

## 2016-04-25 ENCOUNTER — Ambulatory Visit (HOSPITAL_BASED_OUTPATIENT_CLINIC_OR_DEPARTMENT_OTHER): Payer: Medicare Other | Admitting: Hematology and Oncology

## 2016-04-25 ENCOUNTER — Telehealth: Payer: Self-pay | Admitting: Hematology and Oncology

## 2016-04-25 DIAGNOSIS — D708 Other neutropenia: Secondary | ICD-10-CM

## 2016-04-25 DIAGNOSIS — D704 Cyclic neutropenia: Principal | ICD-10-CM

## 2016-04-25 DIAGNOSIS — M059 Rheumatoid arthritis with rheumatoid factor, unspecified: Secondary | ICD-10-CM

## 2016-04-25 DIAGNOSIS — M359 Systemic involvement of connective tissue, unspecified: Secondary | ICD-10-CM

## 2016-04-25 DIAGNOSIS — M0500 Felty's syndrome, unspecified site: Secondary | ICD-10-CM | POA: Diagnosis not present

## 2016-04-25 DIAGNOSIS — M0509 Felty's syndrome, multiple sites: Secondary | ICD-10-CM

## 2016-04-25 LAB — CBC WITH DIFFERENTIAL/PLATELET
BASO%: 0.4 % (ref 0.0–2.0)
BASOS ABS: 0 10*3/uL (ref 0.0–0.1)
EOS ABS: 0 10*3/uL (ref 0.0–0.5)
EOS%: 0.2 % (ref 0.0–7.0)
HEMATOCRIT: 41.1 % (ref 34.8–46.6)
HEMOGLOBIN: 14.5 g/dL (ref 11.6–15.9)
LYMPH#: 1.1 10*3/uL (ref 0.9–3.3)
LYMPH%: 22.4 % (ref 14.0–49.7)
MCH: 31 pg (ref 25.1–34.0)
MCHC: 35.3 g/dL (ref 31.5–36.0)
MCV: 88 fL (ref 79.5–101.0)
MONO#: 0.3 10*3/uL (ref 0.1–0.9)
MONO%: 5.1 % (ref 0.0–14.0)
NEUT#: 3.6 10*3/uL (ref 1.5–6.5)
NEUT%: 71.9 % (ref 38.4–76.8)
PLATELETS: 159 10*3/uL (ref 145–400)
RBC: 4.67 10*6/uL (ref 3.70–5.45)
RDW: 13.1 % (ref 11.2–14.5)
WBC: 5.1 10*3/uL (ref 3.9–10.3)

## 2016-04-25 LAB — COMPREHENSIVE METABOLIC PANEL
ALBUMIN: 4 g/dL (ref 3.5–5.0)
ALK PHOS: 59 U/L (ref 40–150)
ALT: 23 U/L (ref 0–55)
ANION GAP: 12 meq/L — AB (ref 3–11)
AST: 20 U/L (ref 5–34)
BUN: 13 mg/dL (ref 7.0–26.0)
CALCIUM: 9.9 mg/dL (ref 8.4–10.4)
CHLORIDE: 103 meq/L (ref 98–109)
CO2: 24 mEq/L (ref 22–29)
Creatinine: 0.7 mg/dL (ref 0.6–1.1)
EGFR: 85 mL/min/{1.73_m2} — ABNORMAL LOW (ref >90)
Glucose: 125 mg/dl (ref 70–140)
POTASSIUM: 4 meq/L (ref 3.5–5.1)
Sodium: 139 mEq/L (ref 136–145)
Total Bilirubin: 0.63 mg/dL (ref 0.20–1.20)
Total Protein: 7.6 g/dL (ref 6.4–8.3)

## 2016-04-25 NOTE — Assessment & Plan Note (Signed)
She is not symptomatic.  Neutropenia has resolved since we began maintenance treatment. I recommend return visit in 4 months. She agreed

## 2016-04-25 NOTE — Assessment & Plan Note (Signed)
She is doing well with rituximab therapy and that is able to control her arthritis and her neutropenia. In the meantime, I would recommend she continue prednisone at 5 mg daily She is on calcium and vitamin D supplement

## 2016-04-25 NOTE — Progress Notes (Signed)
[facility] OFFICE PROGRESS NOTE  No primary care provider on file. SUMMARY OF HEMATOLOGIC HISTORY:  This patient had been followed here for many years. 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 Bone marrow aspirate and biopsy was performed in 2002 and 2004 with no abnormalities found. The patient was treated with G-CSF with good response. She was subsequently started on methotrexate weekly along with prednisone.  On 09/09/2013, methotrexate was discontinued due to severe neutropenia. She was started on G-CSF injection daily. From 10/14/2013 to 11/04/13, she was started on weekly rituximab. In October 2015, her neutropenia has relapsed. From 04/07/2014 to 04/28/14, she received rituximab again. From 05/31/14, Rituximab is restarted and is given on a monthly basis From 2016 to 2017, the rituximab dose was spaced out further.  11/16/2015, decision was made to stop rituximab  INTERVAL HISTORY: [name] 65 y.o. female returns for follow-up. She feels well. Her arthritis appears to be under control with current dose of prednisone. She denies recent infection  I have reviewed the past medical history, past surgical history, social history and family history with the patient and they are unchanged from previous note.  ALLERGIES:  is allergic to penicillins.  MEDICATIONS:  Current Outpatient Prescriptions  Medication Sig Dispense Refill  . Alum & Mag Hydroxide-Simeth (MAGIC MOUTHWASH W/LIDOCAINE) SOLN Take 5 mLs by mouth 3 (three) times daily as needed. 240 mL 0  . Ascorbic Acid (VITAMIN C) 1000 MG tablet Take 1,000 mg by mouth daily.    . calcium carbonate (CALCIUM 600) 600 MG TABS Take 600 mg by mouth 2 (two) times daily with a meal.      . Cholecalciferol (VITAMIN D) 1000 UNITS capsule Take 1,000 Units by mouth daily.     . cyclobenzaprine (FLEXERIL) 10 MG tablet Take 1 tablet (10 mg total) by mouth 3 (three)  times daily as needed for muscle spasms. 90 tablet 2  . diclofenac sodium (VOLTAREN) 1 % GEL Apply 2 g topically 4 (four) times daily. 3 Tube 2  . DULoxetine (CYMBALTA) 30 MG capsule Take 30 mg by mouth 2 (two) times daily.     Marland Kitchen HYDROcodone-acetaminophen (NORCO) 7.5-325 MG per tablet Take 1 tablet by mouth every 4 (four) hours as needed. Max 6 per day 180 tablet 0  . Melatonin 10 MG CAPS Take 5 mg by mouth at bedtime.     . nortriptyline (PAMELOR) 50 MG capsule TAKE 2 CAPSULES BY MOUTH AT BEDTIME 180 capsule 0  . omeprazole (PRILOSEC) 20 MG capsule Take 20 mg by mouth daily.     . predniSONE (DELTASONE) 5 MG tablet TAKE 1 TABLET BY MOUTH DAILY WITH BREAKFAST 90 tablet 0  . pregabalin (LYRICA) 200 MG capsule TAKE 1 CAPSULE BY MOUTH TWICE DAILY  *90 day supply* 180 capsule 0  . Probiotic Product (PROBIOTIC FORMULA) CAPS Take 1 capsule by mouth daily.      . vitamin B-12 (CYANOCOBALAMIN) 1000 MCG tablet Take 1,000 mcg by mouth every other day.     . zinc gluconate 50 MG tablet Take 100 mg by mouth daily.     No current facility-administered medications for this visit.      REVIEW OF SYSTEMS:   Constitutional: Denies fevers, chills or night sweats Eyes: Denies blurriness of vision Ears, nose, mouth, throat, and face: Denies mucositis or sore throat Respiratory: Denies cough, dyspnea or wheezes Cardiovascular: Denies palpitation, chest discomfort or lower extremity swelling Gastrointestinal:  Denies nausea, heartburn or  change in bowel habits Skin: Denies abnormal skin rashes Lymphatics: Denies new lymphadenopathy or easy bruising Neurological:Denies numbness, tingling or new weaknesses Behavioral/Psych: Mood is stable, no new changes  All other systems were reviewed with the patient and are negative.  PHYSICAL EXAMINATION: ECOG PERFORMANCE STATUS: 0 - Asymptomatic  Vitals:   04/25/16 1203  BP: 123/76  Pulse: 88  Resp: 18  Temp: 98 F (36.7 C)   Filed Weights   04/25/16 1203   Weight: 179 lb 8 oz (81.4 kg)    GENERAL:alert, no distress and comfortable. She appears mildly cushingoid SKIN: skin color, texture, turgor are normal, no rashes or significant lesions EYES: normal, Conjunctiva are pink and non-injected, sclera clear Musculoskeletal:no cyanosis of digits and no clubbing  NEURO: alert & oriented x 3 with fluent speech, no focal motor/sensory deficits  LABORATORY DATA:  I have reviewed the data as listed     Component Value Date/Time   NA 139 04/25/2016 1147   K 4.0 04/25/2016 1147   CL 99 10/19/2012 1051   CO2 24 04/25/2016 1147   GLUCOSE 125 04/25/2016 1147   GLUCOSE 143 (H) 10/19/2012 1051   BUN 13.0 04/25/2016 1147   CREATININE 0.7 04/25/2016 1147   CALCIUM 9.9 04/25/2016 1147   PROT 7.6 04/25/2016 1147   ALBUMIN 4.0 04/25/2016 1147   AST 20 04/25/2016 1147   ALT 23 04/25/2016 1147   ALKPHOS 59 04/25/2016 1147   BILITOT 0.63 04/25/2016 1147   GFRNONAA >60 03/01/2011 1112   GFRAA >60 03/01/2011 1112    No results found for: SPEP, UPEP  Lab Results  Component Value Date   WBC 5.1 04/25/2016   NEUTROABS 3.6 04/25/2016   HGB 14.5 04/25/2016   HCT 41.1 04/25/2016   MCV 88.0 04/25/2016   PLT 159 04/25/2016      Chemistry      Component Value Date/Time   NA 139 04/25/2016 1147   K 4.0 04/25/2016 1147   CL 99 10/19/2012 1051   CO2 24 04/25/2016 1147   BUN 13.0 04/25/2016 1147   CREATININE 0.7 04/25/2016 1147      Component Value Date/Time   CALCIUM 9.9 04/25/2016 1147   ALKPHOS 59 04/25/2016 1147   AST 20 04/25/2016 1147   ALT 23 04/25/2016 1147   BILITOT 0.63 04/25/2016 1147      ASSESSMENT & PLAN:  Neutropenia associated with autoimmune disease She is not symptomatic.  Neutropenia has resolved since we began maintenance treatment. I recommend return visit in 4 months. She agreed   Rheumatoid arthritis, seropositive ([place]) She is doing well with rituximab therapy and that is able to control her arthritis and her  neutropenia. In the meantime, I would recommend she continue prednisone at 5 mg daily She is on calcium and vitamin D supplement     No orders of the defined types were placed in this encounter.   All questions were answered. The patient knows to call the clinic with any problems, questions or concerns. No barriers to learning was detected.  I spent 10 minutes counseling the patient face to face. The total time spent in the appointment was 15 minutes and more than 50% was on counseling.     Heath Lark, MD 11/17/201712:39 PM

## 2016-04-25 NOTE — Telephone Encounter (Signed)
Appointments scheduled per 11/17 LOS. Patient given AVS report and calendars with future scheduled appointments. °

## 2016-05-06 DIAGNOSIS — M797 Fibromyalgia: Secondary | ICD-10-CM | POA: Diagnosis not present

## 2016-05-06 DIAGNOSIS — M0589 Other rheumatoid arthritis with rheumatoid factor of multiple sites: Secondary | ICD-10-CM | POA: Diagnosis not present

## 2016-05-06 DIAGNOSIS — M5136 Other intervertebral disc degeneration, lumbar region: Secondary | ICD-10-CM | POA: Diagnosis not present

## 2016-05-19 DIAGNOSIS — R509 Fever, unspecified: Secondary | ICD-10-CM | POA: Diagnosis not present

## 2016-05-19 DIAGNOSIS — R05 Cough: Secondary | ICD-10-CM | POA: Diagnosis not present

## 2016-05-27 DIAGNOSIS — J209 Acute bronchitis, unspecified: Secondary | ICD-10-CM | POA: Diagnosis not present

## 2016-05-27 DIAGNOSIS — R062 Wheezing: Secondary | ICD-10-CM | POA: Diagnosis not present

## 2016-06-24 DIAGNOSIS — Z79899 Other long term (current) drug therapy: Secondary | ICD-10-CM | POA: Diagnosis not present

## 2016-06-24 DIAGNOSIS — K219 Gastro-esophageal reflux disease without esophagitis: Secondary | ICD-10-CM | POA: Diagnosis not present

## 2016-06-24 DIAGNOSIS — M8580 Other specified disorders of bone density and structure, unspecified site: Secondary | ICD-10-CM | POA: Diagnosis not present

## 2016-06-24 DIAGNOSIS — M797 Fibromyalgia: Secondary | ICD-10-CM | POA: Diagnosis not present

## 2016-06-24 DIAGNOSIS — Z7952 Long term (current) use of systemic steroids: Secondary | ICD-10-CM | POA: Diagnosis not present

## 2016-07-01 DIAGNOSIS — M0589 Other rheumatoid arthritis with rheumatoid factor of multiple sites: Secondary | ICD-10-CM | POA: Diagnosis not present

## 2016-08-01 ENCOUNTER — Other Ambulatory Visit: Payer: Self-pay | Admitting: Hematology and Oncology

## 2016-08-21 ENCOUNTER — Telehealth: Payer: Self-pay | Admitting: Hematology and Oncology

## 2016-08-21 NOTE — Telephone Encounter (Signed)
I will place scheduling msg 

## 2016-08-21 NOTE — Telephone Encounter (Signed)
Patient called and needs to change her appointment for tomorrow 3/16 she needs either Tuesday or Friday in the afternoon  Call back # [phone]

## 2016-08-22 ENCOUNTER — Other Ambulatory Visit: Payer: Medicare Other

## 2016-08-22 ENCOUNTER — Ambulatory Visit: Payer: Medicare Other | Admitting: Hematology and Oncology

## 2016-08-23 ENCOUNTER — Telehealth: Payer: Self-pay | Admitting: Hematology and Oncology

## 2016-08-23 NOTE — Telephone Encounter (Signed)
Per 3/15 schedule message scheduled patient for lab/fu in April on Tuesday or Friday afternoon per patient preference. Left message for patient re appointments for 4/20.

## 2016-08-26 NOTE — Telephone Encounter (Signed)
Schedule mailed.  °

## 2016-09-26 ENCOUNTER — Other Ambulatory Visit (HOSPITAL_BASED_OUTPATIENT_CLINIC_OR_DEPARTMENT_OTHER): Payer: Medicare Other

## 2016-09-26 ENCOUNTER — Ambulatory Visit (HOSPITAL_BASED_OUTPATIENT_CLINIC_OR_DEPARTMENT_OTHER): Payer: Medicare Other | Admitting: Hematology and Oncology

## 2016-09-26 DIAGNOSIS — M069 Rheumatoid arthritis, unspecified: Secondary | ICD-10-CM

## 2016-09-26 DIAGNOSIS — M359 Systemic involvement of connective tissue, unspecified: Secondary | ICD-10-CM | POA: Diagnosis not present

## 2016-09-26 DIAGNOSIS — D708 Other neutropenia: Secondary | ICD-10-CM

## 2016-09-26 DIAGNOSIS — D704 Cyclic neutropenia: Principal | ICD-10-CM

## 2016-09-26 LAB — CBC WITH DIFFERENTIAL/PLATELET
BASO%: 0.5 % (ref 0.0–2.0)
Basophils Absolute: 0 10*3/uL (ref 0.0–0.1)
EOS%: 0.3 % (ref 0.0–7.0)
Eosinophils Absolute: 0 10*3/uL (ref 0.0–0.5)
HCT: 42.2 % (ref 34.8–46.6)
HGB: 14.6 g/dL (ref 11.6–15.9)
LYMPH%: 20.2 % (ref 14.0–49.7)
MCH: 31.5 pg (ref 25.1–34.0)
MCHC: 34.6 g/dL (ref 31.5–36.0)
MCV: 91 fL (ref 79.5–101.0)
MONO#: 0.4 10*3/uL (ref 0.1–0.9)
MONO%: 6.1 % (ref 0.0–14.0)
NEUT#: 4.8 10*3/uL (ref 1.5–6.5)
NEUT%: 72.9 % (ref 38.4–76.8)
PLATELETS: 185 10*3/uL (ref 145–400)
RBC: 4.63 10*6/uL (ref 3.70–5.45)
RDW: 13.2 % (ref 11.2–14.5)
WBC: 6.6 10*3/uL (ref 3.9–10.3)
lymph#: 1.3 10*3/uL (ref 0.9–3.3)

## 2016-09-27 ENCOUNTER — Encounter: Payer: Self-pay | Admitting: Hematology and Oncology

## 2016-09-27 NOTE — Progress Notes (Signed)
[facility] NOTE  Erika Greenhouse, MD SUMMARY OF HEMATOLOGIC HISTORY:  This patient had been followed here for many years. 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 Bone marrow aspirate and biopsy was performed in 2002 and 2004 with no abnormalities found. The patient was treated with G-CSF with good response. She was subsequently started on methotrexate weekly along with prednisone.  On 09/09/2013, methotrexate was discontinued due to severe neutropenia. She was started on G-CSF injection daily. From 10/14/2013 to 11/04/13, she was started on weekly rituximab. In October 2015, her neutropenia has relapsed. From 04/07/2014 to 04/28/14, she received rituximab again. From 05/31/14, Rituximab is restarted and is given on a monthly basis From 2016 to 2017, the rituximab dose was spaced out further.  11/16/2015, decision was made to stop rituximab  INTERVAL HISTORY: [name] 66 y.o. female returns for follow-up. Her rheumatologist have resume rituximab for treatment of rheumatoid arthritis Her joint pain is stable.  She denies recent infection  I have reviewed the past medical history, past surgical history, social history and family history with the patient and they are unchanged from previous note.  ALLERGIES:  is allergic to penicillins.  MEDICATIONS:  Current Outpatient Prescriptions  Medication Sig Dispense Refill  . Ascorbic Acid (VITAMIN C) 1000 MG tablet Take 1,000 mg by mouth daily.    . calcium carbonate (CALCIUM 600) 600 MG TABS Take 600 mg by mouth 2 (two) times daily with a meal.      . Cholecalciferol (VITAMIN D) 1000 UNITS capsule Take 1,000 Units by mouth daily.     . cyclobenzaprine (FLEXERIL) 10 MG tablet Take 1 tablet (10 mg total) by mouth 3 (three) times daily as needed for muscle spasms. 90 tablet 2  . diclofenac sodium (VOLTAREN) 1 % GEL Apply 2 g topically 4 (four) times  daily. 3 Tube 2  . DULoxetine (CYMBALTA) 30 MG capsule Take 30 mg by mouth 2 (two) times daily.     Marland Kitchen HYDROcodone-acetaminophen (NORCO) 7.5-325 MG per tablet Take 1 tablet by mouth every 4 (four) hours as needed. Max 6 per day 180 tablet 0  . Melatonin 10 MG CAPS Take 5 mg by mouth at bedtime.     . nortriptyline (PAMELOR) 50 MG capsule TAKE 2 CAPSULES BY MOUTH AT BEDTIME 180 capsule 0  . omeprazole (PRILOSEC) 20 MG capsule Take 20 mg by mouth daily.     . predniSONE (DELTASONE) 5 MG tablet TAKE 1 TABLET BY MOUTH DAILY WITH BREAKFAST 90 tablet 0  . pregabalin (LYRICA) 200 MG capsule TAKE 1 CAPSULE BY MOUTH TWICE DAILY  *90 day supply* 180 capsule 0  . Probiotic Product (PROBIOTIC FORMULA) CAPS Take 1 capsule by mouth daily.      . vitamin B-12 (CYANOCOBALAMIN) 1000 MCG tablet Take 1,000 mcg by mouth every other day.     . Alum & Mag Hydroxide-Simeth (MAGIC MOUTHWASH W/LIDOCAINE) SOLN Take 5 mLs by mouth 3 (three) times daily as needed. (Patient not taking: Reported on 09/26/2016) 240 mL 0  . zinc gluconate 50 MG tablet Take 100 mg by mouth daily.     No current facility-administered medications for this visit.      REVIEW OF SYSTEMS:   Constitutional: Denies fevers, chills or night sweats Eyes: Denies blurriness of vision Ears, nose, mouth, throat, and face: Denies mucositis or sore throat Respiratory: Denies cough, dyspnea or wheezes Cardiovascular: Denies palpitation, chest discomfort or lower extremity swelling Gastrointestinal:  Denies nausea, heartburn or change in bowel habits Skin: Denies abnormal skin rashes Lymphatics: Denies new lymphadenopathy or easy bruising Neurological:Denies numbness, tingling or new weaknesses Behavioral/Psych: Mood is stable, no new changes  All other systems were reviewed with the patient and are negative.  PHYSICAL EXAMINATION: ECOG PERFORMANCE STATUS: 0 - Asymptomatic  Vitals:   09/26/16 1331  BP: 128/82  Pulse: 85  Resp: 18  Temp: 97.8 F  (36.6 C)   Filed Weights   09/26/16 1331  Weight: 183 lb (83 kg)    GENERAL:alert, no distress and comfortable SKIN: skin color, texture, turgor are normal, no rashes or significant lesions EYES: normal, Conjunctiva are pink and non-injected, sclera clear OROPHARYNX:no exudate, no erythema and lips, buccal mucosa, and tongue normal  NECK: supple, thyroid normal size, non-tender, without nodularity LYMPH:  no palpable lymphadenopathy in the cervical, axillary or inguinal LUNGS: clear to auscultation and percussion with normal breathing effort HEART: regular rate & rhythm and no murmurs and no lower extremity edema ABDOMEN:abdomen soft, non-tender and normal bowel sounds Musculoskeletal:no cyanosis of digits and no clubbing  NEURO: alert & oriented x 3 with fluent speech, no focal motor/sensory deficits  LABORATORY DATA:  I have reviewed the data as listed     Component Value Date/Time   NA 139 04/25/2016 1147   K 4.0 04/25/2016 1147   CL 99 10/19/2012 1051   CO2 24 04/25/2016 1147   GLUCOSE 125 04/25/2016 1147   GLUCOSE 143 (H) 10/19/2012 1051   BUN 13.0 04/25/2016 1147   CREATININE 0.7 04/25/2016 1147   CALCIUM 9.9 04/25/2016 1147   PROT 7.6 04/25/2016 1147   ALBUMIN 4.0 04/25/2016 1147   AST 20 04/25/2016 1147   ALT 23 04/25/2016 1147   ALKPHOS 59 04/25/2016 1147   BILITOT 0.63 04/25/2016 1147   GFRNONAA >60 03/01/2011 1112   GFRAA >60 03/01/2011 1112    No results found for: SPEP, UPEP  Lab Results  Component Value Date   WBC 6.6 09/26/2016   NEUTROABS 4.8 09/26/2016   HGB 14.6 09/26/2016   HCT 42.2 09/26/2016   MCV 91.0 09/26/2016   PLT 185 09/26/2016      Chemistry      Component Value Date/Time   NA 139 04/25/2016 1147   K 4.0 04/25/2016 1147   CL 99 10/19/2012 1051   CO2 24 04/25/2016 1147   BUN 13.0 04/25/2016 1147   CREATININE 0.7 04/25/2016 1147      Component Value Date/Time   CALCIUM 9.9 04/25/2016 1147   ALKPHOS 59 04/25/2016 1147   AST  20 04/25/2016 1147   ALT 23 04/25/2016 1147   BILITOT 0.63 04/25/2016 1147      ASSESSMENT & PLAN:  Neutropenia associated with autoimmune disease She is not symptomatic.  Neutropenia has resolved  The last dose of rituximab given here was a year ago.  She has resumption of rituximab by her rheumatologist for treatment of rheumatoid arthritis I recommend close follow-up with monitoring of blood count through her rheumatologist.  I have not made for the appointment for the patient to come back   No orders of the defined types were placed in this encounter.   All questions were answered. The patient knows to call the clinic with any problems, questions or concerns. No barriers to learning was detected.  I spent 5 minutes counseling the patient face to face. The total time spent in the appointment was 10 minutes and more than 50% was on counseling.  Heath Lark, MD 4/21/20187:13 AM

## 2016-09-27 NOTE — Assessment & Plan Note (Signed)
She is not symptomatic.  Neutropenia has resolved  The last dose of rituximab given here was a year ago.  She has resumption of rituximab by her rheumatologist for treatment of rheumatoid arthritis I recommend close follow-up with monitoring of blood count through her rheumatologist.  I have not made for the appointment for the patient to come back

## 2016-10-31 ENCOUNTER — Other Ambulatory Visit: Payer: Self-pay | Admitting: Hematology and Oncology

## 2016-12-14 ENCOUNTER — Other Ambulatory Visit: Payer: Self-pay | Admitting: Hematology and Oncology

## 2017-03-24 ENCOUNTER — Other Ambulatory Visit: Payer: Self-pay | Admitting: Hematology and Oncology

## 2017-03-24 NOTE — Telephone Encounter (Signed)
Please remind patient future refills through her primary doctor

## 2017-06-24 ENCOUNTER — Other Ambulatory Visit: Payer: Self-pay | Admitting: Hematology and Oncology

## 2018-01-05 NOTE — H&P (Signed)
TOTAL HIP ADMISSION H&P  Patient is admitted for left total hip arthroplasty.  Subjective:  Chief Complaint: left hip pain  HPI: [name], 67 y.o. female, has a history of pain and functional disability in the left hip(s) due to arthritis and patient has failed non-surgical conservative treatments for greater than 12 weeks to include corticosteriod injections, use of assistive devices, activity modification and pain management.  Onset of symptoms was abrupt starting approximately 6 months ago with gradually worsening course since that time.The patient noted no past surgery on the left hip(s).  Patient currently rates pain in the left hip at 9 out of 10 with activity. Patient has night pain, worsening of pain with activity and weight bearing, pain that interfers with activities of daily living and instability. Patient has evidence of joint space narrowing by imaging studies. This condition presents safety issues increasing the risk of falls. There is no current active infection.  Patient Active Problem List   Diagnosis Date Noted  . Thrombocytopenia ([place]) 12/06/2013  . nonhealing ulcer of leg 09/06/2013  . Rheumatoid arthritis, seropositive ([place]) 10/14/2011  . Myalgia and myositis, unspecified 10/14/2011  . Rotator cuff arthropathy 10/14/2011  . 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 Lung mass 01/16/2011   Past Medical History:  Diagnosis Date  . Anemia   . Anxiety   . Blood transfusion   . Calcifying tendinitis of shoulder   . Depression   . Diverticulosis   . Fibromyalgia   . GERD (gastroesophageal reflux disease)   . Joint effusion   . Myalgia   . Myalgia and myositis   . Rheumatoid arthritis(714.0)   . Rheumatoid arthritis(714.0)     Past Surgical History:  Procedure Laterality Date  . APPENDECTOMY    . FOOT FRACTURE SURGERY    . fusion of R wrist    . REPLACEMENT TOTAL KNEE     left  . ROTATOR CUFF REPAIR    . synovectomy of L thumb    . TOTAL ABDOMINAL HYSTERECTOMY    . TOTAL KNEE ARTHROPLASTY     left  . TUBAL LIGATION    . upper palate surgery for TMJ syndrome      No current facility-administered medications for this encounter.    Current Outpatient Medications  Medication Sig Dispense Refill Last Dose  . Alum & Mag Hydroxide-Simeth (MAGIC MOUTHWASH W/LIDOCAINE) SOLN Take 5 mLs by mouth 3 (three) times daily as needed. (Patient not taking: Reported on 09/26/2016) 240 mL 0 Not Taking  . Ascorbic Acid (VITAMIN C) 1000 MG tablet Take 1,000 mg by mouth daily.   Taking  . calcium carbonate (CALCIUM 600) 600 MG TABS Take 600 mg by mouth 2 (two) times daily with a meal.     Taking  . Cholecalciferol (VITAMIN D) 1000 UNITS capsule Take 1,000 Units by mouth daily.    Taking  . cyclobenzaprine (FLEXERIL) 10 MG tablet Take 1 tablet (10 mg total) by mouth 3 (three) times daily as needed for muscle spasms. 90 tablet 2 Taking  . diclofenac sodium (VOLTAREN) 1 % GEL Apply 2 g topically 4 (four) times daily. 3 Tube 2 Taking  . DULoxetine (CYMBALTA) 30 MG capsule Take 30 mg by mouth 2 (two) times daily.    Taking  . HYDROcodone-acetaminophen (NORCO) 7.5-325 MG per tablet Take 1 tablet by mouth every 4 (four) hours as needed. Max 6 per day 180 tablet 0 Taking  . Melatonin 10  MG CAPS Take 5 mg by mouth at bedtime.    Taking  . nortriptyline (PAMELOR) 50 MG capsule TAKE 2 CAPSULES BY MOUTH AT BEDTIME 180 capsule 0 Taking  . omeprazole (PRILOSEC) 20 MG capsule Take 20 mg by mouth daily.    Taking  . predniSONE (DELTASONE) 5 MG tablet TAKE 1 TABLET BY MOUTH DAILY WITH BREAKFAST 90 tablet 0   . pregabalin (LYRICA) 200 MG capsule TAKE 1 CAPSULE BY MOUTH TWICE DAILY  *90 day supply* 180 capsule 0 Taking  . Probiotic Product (PROBIOTIC FORMULA) CAPS Take 1 capsule by mouth daily.     Taking  . vitamin B-12 (CYANOCOBALAMIN) 1000 MCG tablet Take 1,000 mcg by mouth every other day.     Taking  . zinc gluconate 50 MG tablet Take 100 mg by mouth daily.   Taking   Allergies  Allergen Reactions  . Penicillins Itching    Social History   Tobacco Use  . Smoking status: Former Smoker    Packs/day: 0.20    Years: 40.00    Pack years: 8.00    Types: Cigarettes    Last attempt to quit: 12/16/2011    Years since quitting: 6.0  . Smokeless tobacco: Never Used  Substance Use Topics  . Alcohol use: Yes    Comment: social    Family History  Problem Relation Age of Onset  . Clotting disorder Mother        Coumadin  . Allergies Mother   . Heart disease Mother   . Stroke Father   . Heart disease Father   . Alzheimer's disease Father   . Cancer Sister        carcinoid  . Cancer Daughter        breast ca     Review of Systems  Constitutional: Negative for chills and fever.  HENT: Negative for congestion, sore throat and tinnitus.   Eyes: Negative for double vision, photophobia and pain.  Respiratory: Negative for cough, shortness of breath and wheezing.   Cardiovascular: Negative for chest pain, palpitations and orthopnea.  Gastrointestinal: Negative for heartburn, nausea and vomiting.  Genitourinary: Negative for dysuria, frequency and urgency.  Musculoskeletal: Positive for joint pain.  Neurological: Negative for dizziness, weakness and headaches.  Psychiatric/Behavioral: Negative for depression.    Objective:  Physical Exam  Overweight and well developed. General: Alert and oriented x3, cooperative and pleasant, no acute distress. Head: normocephalic, atraumatic, neck supple. Eyes: EOMI. Respiratory: breath sounds clear in all fields, no wheezing, rales, or rhonchi. Cardiovascular: Regular rate and rhythm, no murmurs, gallops or rubs.  Abdomen: non-tender to palpation and soft, normoactive bowel sounds. Musculoskeletal: Right Hip Exam: ROM: Normal without discomfort. There is no tenderness over the greater trochanter. There is no pain on provocative  testing of the hip. Left Hip Exam: ROM: Flexion to 90, Internal Rotation is minimal, External Rotation 20, and Abduction 20 with pain. There is no tenderness over the greater trochanter.  Left Knee Exam: No swelling. Range of motion is 0-125 degrees. No crepitus on range of motion of the knee. No medial or lateral joint line tenderness. Stable knee. Calves soft and nontender. Motor function intact in LE. Strength 5/5 LE bilaterally. Neuro: Distal pulses 2+. Sensation to light touch intact in LE.   Vital signs in last 24 hours: Blood pressure: 118/76 mmHg Pulse: 88 bpm  Labs:   Estimated body mass index is 33.47 kg/m as calculated from the following:   Height as of 09/26/16: 5\' 2"  (1.575  m).   Weight as of 09/26/16: 83 kg (183 lb).   Imaging Review Plain radiographs demonstrate moderate to severe degenerative joint disease of the left hip(s). The bone quality appears to be adequate for age and reported activity level.    Preoperative templating of the joint replacement has been completed, documented, and submitted to the Operating Room personnel in order to optimize intra-operative equipment management.     Assessment/Plan:  End stage arthritis, left hip(s)  The patient history, physical examination, clinical judgement of the provider and imaging studies are consistent with end stage degenerative joint disease of the left hip(s) and total hip arthroplasty is deemed medically necessary. The treatment options including medical management, injection therapy, arthroscopy and arthroplasty were discussed at length. The risks and benefits of total hip arthroplasty were presented and reviewed. The risks due to aseptic loosening, infection, stiffness, dislocation/subluxation,  thromboembolic complications and other imponderables were discussed.  The patient acknowledged the explanation, agreed to proceed with the plan and consent was signed. Patient is being admitted for inpatient treatment for  surgery, pain control, PT, OT, prophylactic antibiotics, VTE prophylaxis, progressive ambulation and ADL's and discharge planning.The patient is planning to be discharged home with home exercise program.    Therapy Plans: HEP Disposition: Home with sister Planned DVT Prophylaxis: aspirin 325 mg BID DME needed: None PCP: Dr. Albertine Patricia (medical clearance provided) TXA: IV Allergies: PCN Other:Pt is on chronic opioid medication for rheumatoid arthritis, complicating post-operative pain management.  - Patient was instructed on what medications to stop prior to surgery. - Follow-up visit in 2 weeks with Dr. Wynelle Link - Begin physical therapy following surgery - Pre-operative lab work as pre-surgical testing - Prescriptions will be provided in hospital at time of discharge  Theresa Duty, PA-C Orthopedic Surgery [facility] [place]

## 2018-01-26 NOTE — Patient Instructions (Signed)
[name]  01/26/2018   Your procedure is scheduled on: 02-03-18   Report to [facility] Main  Entrance    Report to admitting at 11:45AM    Call this number if you have problems the morning of surgery [phone]     Remember: Do not eat food After Midnight. YOU MAY HAVE CLEAR LIQUIDS FROM MIDNIGHT UNTIL 8:15AM. NOTHING BY MOUTH AFTER 8:15AM!   CLEAR LIQUID DIET   Foods Allowed                                                                     Foods Excluded  Coffee and tea, regular and decaf                             liquids that you cannot  Plain Jell-O in any flavor                                             see through such as: Fruit ices (not with fruit pulp)                                     milk, soups, orange juice  Iced Popsicles                                    All solid food Carbonated beverages, regular and diet                                    Cranberry, grape and apple juices Sports drinks like Gatorade Lightly seasoned clear broth or consume(fat free) Sugar, honey syrup  Sample Menu Breakfast                                Lunch                                     Supper Cranberry juice                    Beef broth                            Chicken broth Jell-O                                     Grape juice                           Apple juice Coffee or tea  Jell-O                                      Popsicle                                                Coffee or tea                        Coffee or tea  _____________________________________________________________________       Take these medicines the morning of surgery with A SIP OF WATER: DULOXETINE, OMEPRAZOLE, PREDNISONE                                 You may not have any metal on your body including hair pins and              piercings  Do not wear jewelry, make-up, lotions, powders or perfumes, deodorant             Do not  wear nail polish.  Do not shave  48 hours prior to surgery.                 Do not bring valuables to the hospital. [place].  Contacts, dentures or bridgework may not be worn into surgery.  Leave suitcase in the car. After surgery it may be brought to your room.                 Please read over the following fact sheets you were given: _____________________________________________________________________             [facility] - Preparing for Surgery Before surgery, you can play an important role.  Because skin is not sterile, your skin needs to be as free of germs as possible.  You can reduce the number of germs on your skin by washing with CHG (chlorahexidine gluconate) soap before surgery.  CHG is an antiseptic cleaner which kills germs and bonds with the skin to continue killing germs even after washing. Please DO NOT use if you have an allergy to CHG or antibacterial soaps.  If your skin becomes reddened/irritated stop using the CHG and inform your nurse when you arrive at Short Stay. Do not shave (including legs and underarms) for at least 48 hours prior to the first CHG shower.  You may shave your face/neck. Please follow these instructions carefully:  1.  Shower with CHG Soap the night before surgery and the  morning of Surgery.  2.  If you choose to wash your hair, wash your hair first as usual with your  normal  shampoo.  3.  After you shampoo, rinse your hair and body thoroughly to remove the  shampoo.                           4.  Use CHG as you would any other liquid soap.  You can apply chg directly  to the skin and wash  Gently with a scrungie or clean washcloth.  5.  Apply the CHG Soap to your body ONLY FROM THE NECK DOWN.   Do not use on face/ open                           Wound or open sores. Avoid contact with eyes, ears mouth and genitals (private parts).                       Wash face,  Genitals  (private parts) with your normal soap.             6.  Wash thoroughly, paying special attention to the area where your surgery  will be performed.  7.  Thoroughly rinse your body with warm water from the neck down.  8.  DO NOT shower/wash with your normal soap after using and rinsing off  the CHG Soap.                9.  Pat yourself dry with a clean towel.            10.  Wear clean pajamas.            11.  Place clean sheets on your bed the night of your first shower and do not  sleep with pets. Day of Surgery : Do not apply any lotions/deodorants the morning of surgery.  Please wear clean clothes to the hospital/surgery center.  FAILURE TO FOLLOW THESE INSTRUCTIONS MAY RESULT IN THE CANCELLATION OF YOUR SURGERY PATIENT SIGNATURE_________________________________  NURSE SIGNATURE__________________________________  ________________________________________________________________________   Adam Phenix  An incentive spirometer is a tool that can help keep your lungs clear and active. This tool measures how well you are filling your lungs with each breath. Taking long deep breaths may help reverse or decrease the chance of developing breathing (pulmonary) problems (especially infection) following:  A long period of time when you are unable to move or be active. BEFORE THE PROCEDURE   If the spirometer includes an indicator to show your best effort, your nurse or respiratory therapist will set it to a desired goal.  If possible, sit up straight or lean slightly forward. Try not to slouch.  Hold the incentive spirometer in an upright position. INSTRUCTIONS FOR USE  1. Sit on the edge of your bed if possible, or sit up as far as you can in bed or on a chair. 2. Hold the incentive spirometer in an upright position. 3. Breathe out normally. 4. Place the mouthpiece in your mouth and seal your lips tightly around it. 5. Breathe in slowly and as deeply as possible, raising the  piston or the ball toward the top of the column. 6. Hold your breath for 3-5 seconds or for as long as possible. Allow the piston or ball to fall to the bottom of the column. 7. Remove the mouthpiece from your mouth and breathe out normally. 8. Rest for a few seconds and repeat Steps 1 through 7 at least 10 times every 1-2 hours when you are awake. Take your time and take a few normal breaths between deep breaths. 9. The spirometer may include an indicator to show your best effort. Use the indicator as a goal to work toward during each repetition. 10. After each set of 10 deep breaths, practice coughing to be sure your lungs are clear. If you have an incision (the cut made at the time of  surgery), support your incision when coughing by placing a pillow or rolled up towels firmly against it. Once you are able to get out of bed, walk around indoors and cough well. You may stop using the incentive spirometer when instructed by your caregiver.  RISKS AND COMPLICATIONS  Take your time so you do not get dizzy or light-headed.  If you are in pain, you may need to take or ask for pain medication before doing incentive spirometry. It is harder to take a deep breath if you are having pain. AFTER USE  Rest and breathe slowly and easily.  It can be helpful to keep track of a log of your progress. Your caregiver can provide you with a simple table to help with this. If you are using the spirometer at home, follow these instructions: [place] IF:   You are having difficultly using the spirometer.  You have trouble using the spirometer as often as instructed.  Your pain medication is not giving enough relief while using the spirometer.  You develop fever of 100.5 F (38.1 C) or higher. SEEK IMMEDIATE MEDICAL CARE IF:   You cough up bloody sputum that had not been present before.  You develop fever of 102 F (38.9 C) or greater.  You develop worsening pain at or near the incision  site. MAKE SURE YOU:   Understand these instructions.  Will watch your condition.  Will get help right away if you are not doing well or get worse. Document Released: 10/06/2006 Document Revised: 08/18/2011 Document Reviewed: 12/07/2006 Exit[employer] Patient Information 2014 Exit[employer], [place].   ________________________________________________________________________  WHAT IS A BLOOD TRANSFUSION? Blood Transfusion Information  A transfusion is the replacement of blood or some of its parts. Blood is made up of multiple cells which provide different functions.  Red blood cells carry oxygen and are used for blood loss replacement.  White blood cells fight against infection.  Platelets control bleeding.  Plasma helps clot blood.  Other blood products are available for specialized needs, such as hemophilia or other clotting disorders. BEFORE THE TRANSFUSION  Who gives blood for transfusions?   Healthy volunteers who are fully evaluated to make sure their blood is safe. This is blood bank blood. Transfusion therapy is the safest it has ever been in the practice of medicine. Before blood is taken from a donor, a complete history is taken to make sure that person has no history of diseases nor engages in risky social behavior (examples are intravenous drug use or sexual activity with multiple partners). The donor's travel history is screened to minimize risk of transmitting infections, such as malaria. The donated blood is tested for signs of infectious diseases, such as HIV and hepatitis. The blood is then tested to be sure it is compatible with you in order to minimize the chance of a transfusion reaction. If you or a relative donates blood, this is often done in anticipation of surgery and is not appropriate for emergency situations. It takes many days to process the donated blood. RISKS AND COMPLICATIONS Although transfusion therapy is very safe and saves many lives, the main dangers of  transfusion include:   Getting an infectious disease.  Developing a transfusion reaction. This is an allergic reaction to something in the blood you were given. Every precaution is taken to prevent this. The decision to have a blood transfusion has been considered carefully by your caregiver before blood is given. Blood is not given unless the benefits outweigh the risks. AFTER THE  TRANSFUSION  Right after receiving a blood transfusion, you will usually feel much better and more energetic. This is especially true if your red blood cells have gotten low (anemic). The transfusion raises the level of the red blood cells which carry oxygen, and this usually causes an energy increase.  The nurse administering the transfusion will monitor you carefully for complications. HOME CARE INSTRUCTIONS  No special instructions are needed after a transfusion. You may find your energy is better. Speak with your caregiver about any limitations on activity for underlying diseases you may have. SEEK MEDICAL CARE IF:   Your condition is not improving after your transfusion.  You develop redness or irritation at the intravenous (IV) site. SEEK IMMEDIATE MEDICAL CARE IF:  Any of the following symptoms occur over the next 12 hours:  Shaking chills.  You have a temperature by mouth above 102 F (38.9 C), not controlled by medicine.  Chest, back, or muscle pain.  People around you feel you are not acting correctly or are confused.  Shortness of breath or difficulty breathing.  Dizziness and fainting.  You get a rash or develop hives.  You have a decrease in urine output.  Your urine turns a dark color or changes to pink, red, or brown. Any of the following symptoms occur over the next 10 days:  You have a temperature by mouth above 102 F (38.9 C), not controlled by medicine.  Shortness of breath.  Weakness after normal activity.  The white part of the eye turns yellow (jaundice).  You have a  decrease in the amount of urine or are urinating less often.  Your urine turns a dark color or changes to pink, red, or brown. Document Released: 05/23/2000 Document Revised: 08/18/2011 Document Reviewed: 01/10/2008 [facility] Patient Information 2014 [place], [place].  _______________________________________________________________________

## 2018-01-26 NOTE — Progress Notes (Signed)
LOV DR [name] 12-07-17 ON CHART   SURGICAL CLEARANCE; DR Stanton Kidney [name] 12-08-17 ON CHART  EKG 12-08-17 ON CHART FROM [facility]

## 2018-01-27 ENCOUNTER — Other Ambulatory Visit: Payer: Self-pay

## 2018-01-27 ENCOUNTER — Encounter (HOSPITAL_COMMUNITY)
Admission: RE | Admit: 2018-01-27 | Discharge: 2018-01-27 | Disposition: A | Discharge status: Home / Self Care | Payer: Medicare Other | Source: Ambulatory Visit | Attending: Orthopedic Surgery | Admitting: Orthopedic Surgery

## 2018-01-27 ENCOUNTER — Encounter (HOSPITAL_COMMUNITY): Payer: Self-pay

## 2018-01-27 DIAGNOSIS — Z01812 Encounter for preprocedural laboratory examination: Secondary | ICD-10-CM | POA: Diagnosis present

## 2018-01-27 HISTORY — DX: Unspecified cataract: H26.9

## 2018-01-27 LAB — CBC
HCT: 40.3 % (ref 36.0–46.0)
HEMOGLOBIN: 13.9 g/dL (ref 12.0–15.0)
MCH: 31.5 pg (ref 26.0–34.0)
MCHC: 34.5 g/dL (ref 30.0–36.0)
MCV: 91.4 fL (ref 78.0–100.0)
PLATELETS: 189 10*3/uL (ref 150–400)
RBC: 4.41 MIL/uL (ref 3.87–5.11)
RDW: 12.7 % (ref 11.5–15.5)
WBC: 5.5 10*3/uL (ref 4.0–10.5)

## 2018-01-27 LAB — COMPREHENSIVE METABOLIC PANEL
ALK PHOS: 45 U/L (ref 38–126)
ALT: 12 U/L (ref 0–44)
AST: 17 U/L (ref 15–41)
Albumin: 4.1 g/dL (ref 3.5–5.0)
Anion gap: 8 (ref 5–15)
BUN: 10 mg/dL (ref 8–23)
CO2: 29 mmol/L (ref 22–32)
CREATININE: 0.64 mg/dL (ref 0.44–1.00)
Calcium: 9.6 mg/dL (ref 8.9–10.3)
Chloride: 107 mmol/L (ref 98–111)
GFR calc non Af Amer: >60 mL/min (ref >60)
Glucose, Bld: 125 mg/dL — ABNORMAL HIGH (ref 70–99)
Potassium: 4 mmol/L (ref 3.5–5.1)
Sodium: 144 mmol/L (ref 135–145)
Total Bilirubin: 0.7 mg/dL (ref 0.3–1.2)
Total Protein: 7.2 g/dL (ref 6.5–8.1)

## 2018-01-27 LAB — APTT: aPTT: 29 seconds (ref 24–36)

## 2018-01-27 LAB — PROTIME-INR
INR: 1.07
PROTHROMBIN TIME: 13.8 s (ref 11.4–15.2)

## 2018-01-28 LAB — SURGICAL PCR SCREEN
MRSA, PCR: POSITIVE — AB
Staphylococcus aureus: POSITIVE — AB

## 2018-02-03 ENCOUNTER — Other Ambulatory Visit: Payer: Self-pay

## 2018-02-03 ENCOUNTER — Inpatient Hospital Stay (HOSPITAL_COMMUNITY): Payer: Medicare Other | Admitting: Certified Registered Nurse Anesthetist

## 2018-02-03 ENCOUNTER — Encounter (HOSPITAL_COMMUNITY)
Admission: RE | Disposition: A | Discharge status: Home / Self Care | Payer: Self-pay | Source: Ambulatory Visit | Attending: Orthopedic Surgery

## 2018-02-03 ENCOUNTER — Encounter (HOSPITAL_COMMUNITY): Payer: Self-pay | Admitting: Certified Registered Nurse Anesthetist

## 2018-02-03 ENCOUNTER — Inpatient Hospital Stay (HOSPITAL_COMMUNITY): Payer: Medicare Other

## 2018-02-03 ENCOUNTER — Inpatient Hospital Stay (HOSPITAL_COMMUNITY)
Admission: RE | Admit: 2018-02-03 | Discharge: 2018-02-05 | DRG: 470 | Disposition: A | Discharge status: Home / Self Care | Payer: Medicare Other | Attending: Orthopedic Surgery | Admitting: Orthopedic Surgery

## 2018-02-03 DIAGNOSIS — Z79890 Hormone replacement therapy (postmenopausal): Secondary | ICD-10-CM

## 2018-02-03 DIAGNOSIS — Z419 Encounter for procedure for purposes other than remedying health state, unspecified: Secondary | ICD-10-CM

## 2018-02-03 DIAGNOSIS — Z880 Allergy status to penicillin: Secondary | ICD-10-CM | POA: Diagnosis not present

## 2018-02-03 DIAGNOSIS — Z87891 Personal history of nicotine dependence: Secondary | ICD-10-CM

## 2018-02-03 DIAGNOSIS — M797 Fibromyalgia: Secondary | ICD-10-CM | POA: Diagnosis present

## 2018-02-03 DIAGNOSIS — Z90710 Acquired absence of both cervix and uterus: Secondary | ICD-10-CM | POA: Diagnosis not present

## 2018-02-03 DIAGNOSIS — Z6833 Body mass index (BMI) 33.0-33.9, adult: Secondary | ICD-10-CM | POA: Diagnosis not present

## 2018-02-03 DIAGNOSIS — E663 Overweight: Secondary | ICD-10-CM | POA: Diagnosis present

## 2018-02-03 DIAGNOSIS — M069 Rheumatoid arthritis, unspecified: Secondary | ICD-10-CM | POA: Diagnosis present

## 2018-02-03 DIAGNOSIS — Z79899 Other long term (current) drug therapy: Secondary | ICD-10-CM

## 2018-02-03 DIAGNOSIS — K219 Gastro-esophageal reflux disease without esophagitis: Secondary | ICD-10-CM | POA: Diagnosis present

## 2018-02-03 DIAGNOSIS — M169 Osteoarthritis of hip, unspecified: Secondary | ICD-10-CM | POA: Diagnosis present

## 2018-02-03 DIAGNOSIS — Z96652 Presence of left artificial knee joint: Secondary | ICD-10-CM | POA: Diagnosis present

## 2018-02-03 DIAGNOSIS — Z96649 Presence of unspecified artificial hip joint: Secondary | ICD-10-CM

## 2018-02-03 DIAGNOSIS — M1612 Unilateral primary osteoarthritis, left hip: Secondary | ICD-10-CM | POA: Diagnosis present

## 2018-02-03 HISTORY — PX: TOTAL HIP ARTHROPLASTY: SHX124

## 2018-02-03 LAB — TYPE AND SCREEN
ABO/RH(D): A POS
Antibody Screen: NEGATIVE

## 2018-02-03 SURGERY — ARTHROPLASTY, HIP, TOTAL, ANTERIOR APPROACH
Anesthesia: Spinal | Site: Hip | Laterality: Left

## 2018-02-03 MED ORDER — DULOXETINE HCL 30 MG PO CPEP
30.0000 mg | ORAL_CAPSULE | Freq: Two times a day (BID) | ORAL | Status: DC
  Administered 2018-02-03 – 2018-02-05 (×4): 30 mg via ORAL
  Filled 2018-02-03 (×4): qty 1

## 2018-02-03 MED ORDER — PROMETHAZINE HCL 25 MG/ML IJ SOLN: 6.2500 mg | INTRAMUSCULAR | Status: DC | PRN

## 2018-02-03 MED ORDER — ONDANSETRON HCL 4 MG PO TABS: 4.0000 mg | ORAL_TABLET | Freq: Four times a day (QID) | ORAL | Status: DC | PRN

## 2018-02-03 MED ORDER — PREDNISONE 5 MG PO TABS: 5.0000 mg | ORAL_TABLET | Freq: Every day | ORAL | Status: DC

## 2018-02-03 MED ORDER — PREGABALIN 100 MG PO CAPS
200.0000 mg | ORAL_CAPSULE | Freq: Two times a day (BID) | ORAL | Status: DC
  Administered 2018-02-03 – 2018-02-05 (×4): 200 mg via ORAL
  Filled 2018-02-03 (×4): qty 2

## 2018-02-03 MED ORDER — DEXAMETHASONE SODIUM PHOSPHATE 10 MG/ML IJ SOLN
10.0000 mg | Freq: Once | INTRAMUSCULAR | Status: AC
  Administered 2018-02-04: 10 mg via INTRAVENOUS
  Filled 2018-02-03: qty 1

## 2018-02-03 MED ORDER — TRANEXAMIC ACID 1000 MG/10ML IV SOLN
1000.0000 mg | INTRAVENOUS | Status: AC
  Administered 2018-02-03: 1000 mg via INTRAVENOUS
  Filled 2018-02-03: qty 10

## 2018-02-03 MED ORDER — ACETAMINOPHEN 500 MG PO TABS
500.0000 mg | ORAL_TABLET | Freq: Four times a day (QID) | ORAL | Status: AC
  Administered 2018-02-03 – 2018-02-04 (×4): 500 mg via ORAL
  Filled 2018-02-03 (×4): qty 1

## 2018-02-03 MED ORDER — METOCLOPRAMIDE HCL 5 MG PO TABS: 5.0000 mg | ORAL_TABLET | Freq: Three times a day (TID) | ORAL | Status: DC | PRN

## 2018-02-03 MED ORDER — HYDROCODONE-ACETAMINOPHEN 7.5-325 MG PO TABS: 1.0000 | ORAL_TABLET | ORAL | Status: DC | PRN

## 2018-02-03 MED ORDER — BUPIVACAINE IN DEXTROSE 0.75-8.25 % IT SOLN
INTRATHECAL | Status: DC | PRN
  Administered 2018-02-03: 2 mL via INTRATHECAL

## 2018-02-03 MED ORDER — PANTOPRAZOLE SODIUM 40 MG PO TBEC
40.0000 mg | DELAYED_RELEASE_TABLET | Freq: Every day | ORAL | Status: DC
  Administered 2018-02-04 – 2018-02-05 (×2): 40 mg via ORAL
  Filled 2018-02-03 (×2): qty 1

## 2018-02-03 MED ORDER — FENTANYL CITRATE (PF) 100 MCG/2ML IJ SOLN
INTRAMUSCULAR | Status: AC
  Filled 2018-02-03: qty 2

## 2018-02-03 MED ORDER — ACETAMINOPHEN 10 MG/ML IV SOLN
1000.0000 mg | Freq: Four times a day (QID) | INTRAVENOUS | Status: DC
  Administered 2018-02-03: 1000 mg via INTRAVENOUS
  Filled 2018-02-03: qty 100

## 2018-02-03 MED ORDER — BUPIVACAINE-EPINEPHRINE (PF) 0.25% -1:200000 IJ SOLN
INTRAMUSCULAR | Status: AC
  Filled 2018-02-03: qty 30

## 2018-02-03 MED ORDER — BUPIVACAINE-EPINEPHRINE (PF) 0.25% -1:200000 IJ SOLN
INTRAMUSCULAR | Status: DC | PRN
  Administered 2018-02-03: 30 mL via PERINEURAL

## 2018-02-03 MED ORDER — TRANEXAMIC ACID 1000 MG/10ML IV SOLN
1000.0000 mg | Freq: Once | INTRAVENOUS | Status: AC
  Administered 2018-02-03: 1000 mg via INTRAVENOUS
  Filled 2018-02-03: qty 1000

## 2018-02-03 MED ORDER — PREDNISONE 5 MG PO TABS
10.0000 mg | ORAL_TABLET | Freq: Two times a day (BID) | ORAL | Status: DC
  Administered 2018-02-05: 10 mg via ORAL
  Filled 2018-02-03: qty 2

## 2018-02-03 MED ORDER — POLYETHYLENE GLYCOL 3350 17 G PO PACK: 17.0000 g | PACK | Freq: Every day | ORAL | Status: DC | PRN

## 2018-02-03 MED ORDER — DIPHENHYDRAMINE HCL 12.5 MG/5ML PO ELIX: 12.5000 mg | ORAL_SOLUTION | ORAL | Status: DC | PRN

## 2018-02-03 MED ORDER — FENTANYL CITRATE (PF) 100 MCG/2ML IJ SOLN
INTRAMUSCULAR | Status: DC | PRN
  Administered 2018-02-03: 100 ug via INTRAVENOUS

## 2018-02-03 MED ORDER — PREDNISONE 5 MG PO TABS: 5.0000 mg | ORAL_TABLET | Freq: Two times a day (BID) | ORAL | Status: DC

## 2018-02-03 MED ORDER — BISACODYL 10 MG RE SUPP: 10.0000 mg | Freq: Every day | RECTAL | Status: DC | PRN

## 2018-02-03 MED ORDER — METHOCARBAMOL 500 MG PO TABS
500.0000 mg | ORAL_TABLET | Freq: Four times a day (QID) | ORAL | Status: DC | PRN
  Administered 2018-02-04 – 2018-02-05 (×2): 500 mg via ORAL
  Filled 2018-02-03 (×3): qty 1

## 2018-02-03 MED ORDER — VANCOMYCIN HCL IN DEXTROSE 1-5 GM/200ML-% IV SOLN: 1000.0000 mg | Freq: Once | INTRAVENOUS | Status: DC

## 2018-02-03 MED ORDER — LIDOCAINE HCL (CARDIAC) PF 100 MG/5ML IV SOSY
PREFILLED_SYRINGE | INTRAVENOUS | Status: DC | PRN
  Administered 2018-02-03: 60 mg via INTRAVENOUS

## 2018-02-03 MED ORDER — ONDANSETRON HCL 4 MG/2ML IJ SOLN: 4.0000 mg | Freq: Four times a day (QID) | INTRAMUSCULAR | Status: DC | PRN

## 2018-02-03 MED ORDER — HYDROCODONE-ACETAMINOPHEN 5-325 MG PO TABS
1.0000 | ORAL_TABLET | ORAL | Status: DC | PRN
  Administered 2018-02-04: 2 via ORAL
  Administered 2018-02-04: 1 via ORAL
  Administered 2018-02-04 – 2018-02-05 (×2): 2 via ORAL
  Filled 2018-02-03 (×2): qty 1
  Filled 2018-02-03: qty 2
  Filled 2018-02-03: qty 1
  Filled 2018-02-03: qty 2

## 2018-02-03 MED ORDER — DOCUSATE SODIUM 100 MG PO CAPS
100.0000 mg | ORAL_CAPSULE | Freq: Two times a day (BID) | ORAL | Status: DC
  Administered 2018-02-03 – 2018-02-05 (×4): 100 mg via ORAL
  Filled 2018-02-03 (×4): qty 1

## 2018-02-03 MED ORDER — PROPOFOL 500 MG/50ML IV EMUL
INTRAVENOUS | Status: DC | PRN
  Administered 2018-02-03: 100 ug/kg/min via INTRAVENOUS

## 2018-02-03 MED ORDER — PREDNISONE 20 MG PO TABS
20.0000 mg | ORAL_TABLET | Freq: Two times a day (BID) | ORAL | Status: AC
  Administered 2018-02-04: 20 mg via ORAL
  Filled 2018-02-03 (×2): qty 1

## 2018-02-03 MED ORDER — SODIUM CHLORIDE 0.9 % IV SOLN
INTRAVENOUS | Status: DC
  Administered 2018-02-03 (×2): via INTRAVENOUS

## 2018-02-03 MED ORDER — VANCOMYCIN HCL IN DEXTROSE 1-5 GM/200ML-% IV SOLN
1000.0000 mg | INTRAVENOUS | Status: AC
  Administered 2018-02-03: 1000 mg via INTRAVENOUS
  Filled 2018-02-03: qty 200

## 2018-02-03 MED ORDER — PHENYLEPHRINE 40 MCG/ML (10ML) SYRINGE FOR IV PUSH (FOR BLOOD PRESSURE SUPPORT)
PREFILLED_SYRINGE | INTRAVENOUS | Status: DC | PRN
  Administered 2018-02-03 (×2): 160 ug via INTRAVENOUS
  Administered 2018-02-03 (×9): 80 ug via INTRAVENOUS

## 2018-02-03 MED ORDER — PROPOFOL 10 MG/ML IV BOLUS
INTRAVENOUS | Status: DC | PRN
  Administered 2018-02-03: 40 mg via INTRAVENOUS

## 2018-02-03 MED ORDER — HYDROMORPHONE HCL 1 MG/ML IJ SOLN: 0.2500 mg | INTRAMUSCULAR | Status: DC | PRN

## 2018-02-03 MED ORDER — POTASSIUM CHLORIDE CRYS ER 10 MEQ PO TBCR
10.0000 meq | EXTENDED_RELEASE_TABLET | Freq: Every day | ORAL | Status: DC
  Administered 2018-02-04 – 2018-02-05 (×2): 10 meq via ORAL
  Filled 2018-02-03 (×4): qty 1

## 2018-02-03 MED ORDER — CEFAZOLIN SODIUM-DEXTROSE 2-4 GM/100ML-% IV SOLN
2.0000 g | INTRAVENOUS | Status: AC
  Administered 2018-02-03: 2 g via INTRAVENOUS
  Filled 2018-02-03: qty 100

## 2018-02-03 MED ORDER — VANCOMYCIN HCL IN DEXTROSE 1-5 GM/200ML-% IV SOLN
1000.0000 mg | Freq: Two times a day (BID) | INTRAVENOUS | Status: AC
  Administered 2018-02-04: 1000 mg via INTRAVENOUS
  Filled 2018-02-03: qty 200

## 2018-02-03 MED ORDER — CYCLOBENZAPRINE HCL 10 MG PO TABS
10.0000 mg | ORAL_TABLET | Freq: Three times a day (TID) | ORAL | Status: DC | PRN
  Administered 2018-02-04 (×2): 10 mg via ORAL
  Filled 2018-02-03 (×2): qty 1

## 2018-02-03 MED ORDER — NORTRIPTYLINE HCL 25 MG PO CAPS
50.0000 mg | ORAL_CAPSULE | Freq: Every day | ORAL | Status: DC
  Administered 2018-02-03 – 2018-02-04 (×2): 50 mg via ORAL
  Filled 2018-02-03 (×2): qty 2

## 2018-02-03 MED ORDER — MORPHINE SULFATE (PF) 2 MG/ML IV SOLN
0.5000 mg | INTRAVENOUS | Status: DC | PRN
  Administered 2018-02-03: 0.5 mg via INTRAVENOUS
  Administered 2018-02-03: 1 mg via INTRAVENOUS
  Filled 2018-02-03 (×2): qty 1

## 2018-02-03 MED ORDER — METOCLOPRAMIDE HCL 5 MG/ML IJ SOLN: 5.0000 mg | Freq: Three times a day (TID) | INTRAMUSCULAR | Status: DC | PRN

## 2018-02-03 MED ORDER — METHOCARBAMOL 500 MG IVPB - SIMPLE MED
500.0000 mg | Freq: Four times a day (QID) | INTRAVENOUS | Status: DC | PRN
  Administered 2018-02-03: 500 mg via INTRAVENOUS
  Filled 2018-02-03: qty 50

## 2018-02-03 MED ORDER — PHENYLEPHRINE 40 MCG/ML (10ML) SYRINGE FOR IV PUSH (FOR BLOOD PRESSURE SUPPORT)
PREFILLED_SYRINGE | INTRAVENOUS | Status: AC
  Filled 2018-02-03: qty 20

## 2018-02-03 MED ORDER — MENTHOL 3 MG MT LOZG: 1.0000 | LOZENGE | OROMUCOSAL | Status: DC | PRN

## 2018-02-03 MED ORDER — PHENOL 1.4 % MT LIQD: 1.0000 | OROMUCOSAL | Status: DC | PRN

## 2018-02-03 MED ORDER — EPHEDRINE SULFATE-NACL 50-0.9 MG/10ML-% IV SOSY
PREFILLED_SYRINGE | INTRAVENOUS | Status: DC | PRN
  Administered 2018-02-03: 5 mg via INTRAVENOUS
  Administered 2018-02-03: 10 mg via INTRAVENOUS
  Administered 2018-02-03: 5 mg via INTRAVENOUS

## 2018-02-03 MED ORDER — PHENYLEPHRINE HCL 10 MG/ML IJ SOLN
INTRAMUSCULAR | Status: AC
  Filled 2018-02-03: qty 1

## 2018-02-03 MED ORDER — FLEET ENEMA 7-19 GM/118ML RE ENEM: 1.0000 | ENEMA | Freq: Once | RECTAL | Status: DC | PRN

## 2018-02-03 MED ORDER — SODIUM CHLORIDE 0.9 % IR SOLN
Status: DC | PRN
  Administered 2018-02-03: 1000 mL

## 2018-02-03 MED ORDER — METHOCARBAMOL 500 MG IVPB - SIMPLE MED
INTRAVENOUS | Status: AC
  Filled 2018-02-03: qty 50

## 2018-02-03 MED ORDER — CHLORHEXIDINE GLUCONATE 4 % EX LIQD: 60.0000 mL | Freq: Once | CUTANEOUS | Status: DC

## 2018-02-03 MED ORDER — ASPIRIN EC 325 MG PO TBEC
325.0000 mg | DELAYED_RELEASE_TABLET | Freq: Two times a day (BID) | ORAL | Status: DC
  Administered 2018-02-04 – 2018-02-05 (×3): 325 mg via ORAL
  Filled 2018-02-03 (×3): qty 1

## 2018-02-03 MED ORDER — STERILE WATER FOR IRRIGATION IR SOLN
Status: DC | PRN
  Administered 2018-02-03: 2000 mL

## 2018-02-03 MED ORDER — PREDNISONE 20 MG PO TABS
20.0000 mg | ORAL_TABLET | Freq: Once | ORAL | Status: AC
  Administered 2018-02-03: 20 mg via ORAL
  Filled 2018-02-03: qty 1

## 2018-02-03 MED ORDER — PROPOFOL 10 MG/ML IV BOLUS
INTRAVENOUS | Status: AC
  Filled 2018-02-03: qty 80

## 2018-02-03 MED ORDER — DEXAMETHASONE SODIUM PHOSPHATE 10 MG/ML IJ SOLN
8.0000 mg | Freq: Once | INTRAMUSCULAR | Status: AC
  Administered 2018-02-03: 10 mg via INTRAVENOUS

## 2018-02-03 MED ORDER — LACTATED RINGERS IV SOLN
INTRAVENOUS | Status: DC
  Administered 2018-02-03 (×2): via INTRAVENOUS

## 2018-02-03 SURGICAL SUPPLY — 44 items
BAG DECANTER FOR FLEXI CONT (MISCELLANEOUS) IMPLANT
BAG ZIPLOCK 12X15 (MISCELLANEOUS) IMPLANT
BALL HIP CERAMIC (Hips) IMPLANT
BLADE SAG 18X100X1.27 (BLADE) ×3 IMPLANT
CLOSURE WOUND 1/2 X4 (GAUZE/BANDAGES/DRESSINGS) ×1
COVER PERINEAL POST (MISCELLANEOUS) ×3 IMPLANT
COVER SURGICAL LIGHT HANDLE (MISCELLANEOUS) ×3 IMPLANT
CUP ACETBLR 48 OD SECTOR II (Hips) ×3 IMPLANT
DECANTER SPIKE VIAL GLASS SM (MISCELLANEOUS) ×3 IMPLANT
DRAPE STERI IOBAN 125X83 (DRAPES) ×3 IMPLANT
DRAPE U-SHAPE 47X51 STRL (DRAPES) ×6 IMPLANT
DRSG ADAPTIC 3X8 NADH LF (GAUZE/BANDAGES/DRESSINGS) ×3 IMPLANT
DRSG EMULSION OIL 3X16 NADH (GAUZE/BANDAGES/DRESSINGS) ×3 IMPLANT
DRSG MEPILEX BORDER 4X4 (GAUZE/BANDAGES/DRESSINGS) ×3 IMPLANT
DRSG MEPILEX BORDER 4X8 (GAUZE/BANDAGES/DRESSINGS) ×3 IMPLANT
DURAPREP 26ML APPLICATOR (WOUND CARE) ×3 IMPLANT
ELECT REM PT RETURN 15FT ADLT (MISCELLANEOUS) ×3 IMPLANT
EVACUATOR 1/8 PVC DRAIN (DRAIN) ×3 IMPLANT
GLOVE BIO SURGEON STRL SZ7 (GLOVE) ×3 IMPLANT
GLOVE BIO SURGEON STRL SZ8 (GLOVE) ×3 IMPLANT
GLOVE BIOGEL PI IND STRL 7.0 (GLOVE) ×1 IMPLANT
GLOVE BIOGEL PI IND STRL 8 (GLOVE) ×1 IMPLANT
GLOVE BIOGEL PI INDICATOR 7.0 (GLOVE) ×2
GLOVE BIOGEL PI INDICATOR 8 (GLOVE) ×2
GOWN STRL REUS W/TWL LRG LVL3 (GOWN DISPOSABLE) ×3 IMPLANT
GOWN STRL REUS W/TWL XL LVL3 (GOWN DISPOSABLE) ×3 IMPLANT
HEAD CERAMIC DELTA 28M 12/14P5 (Head) ×3 IMPLANT
HIP BALL CERAMIC (Hips) IMPLANT
HOLDER FOLEY CATH W/STRAP (MISCELLANEOUS) ×3 IMPLANT
LINER MARATHON 28 48 (Hips) ×3 IMPLANT
MANIFOLD NEPTUNE II (INSTRUMENTS) ×3 IMPLANT
PACK ANTERIOR HIP CUSTOM (KITS) ×3 IMPLANT
STEM CORAIL KLA11 (Stem) ×3 IMPLANT
STRIP CLOSURE SKIN 1/2X4 (GAUZE/BANDAGES/DRESSINGS) ×2 IMPLANT
SUT ETHIBOND NAB CT1 #1 30IN (SUTURE) ×3 IMPLANT
SUT MNCRL AB 4-0 PS2 18 (SUTURE) ×3 IMPLANT
SUT STRATAFIX 0 PDS 27 VIOLET (SUTURE) ×3
SUT VIC AB 2-0 CT1 27 (SUTURE) ×4
SUT VIC AB 2-0 CT1 TAPERPNT 27 (SUTURE) ×2 IMPLANT
SUTURE STRATFX 0 PDS 27 VIOLET (SUTURE) ×1 IMPLANT
SYR 50ML LL SCALE MARK (SYRINGE) IMPLANT
TRAY FOLEY MTR SLVR 14FR STAT (SET/KITS/TRAYS/PACK) ×3 IMPLANT
TRAY FOLEY MTR SLVR 16FR STAT (SET/KITS/TRAYS/PACK) IMPLANT
YANKAUER SUCT BULB TIP 10FT TU (MISCELLANEOUS) ×3 IMPLANT

## 2018-02-03 NOTE — Transfer of Care (Signed)
Immediate Anesthesia Transfer of Care Note  Patient: [name]  Procedure(s) Performed: LEFT TOTAL HIP ARTHROPLASTY ANTERIOR APPROACH (Left Hip)  Patient Location: PACU  Anesthesia Type:Spinal  Level of Consciousness: awake, alert , oriented and patient cooperative  Airway & Oxygen Therapy: Patient Spontanous Breathing and Patient connected to face mask oxygen  Post-op Assessment: Report given to RN, Post -op Vital signs reviewed and stable and Patient moving all extremities  Post vital signs: Reviewed and stable  Last Vitals:  Vitals Value Taken Time  BP 133/75 02/03/2018  4:51 PM  Temp    Pulse 71 02/03/2018  4:55 PM  Resp 12 02/03/2018  4:55 PM  SpO2 100 % 02/03/2018  4:55 PM  Vitals shown include unvalidated device data.  Last Pain:  Vitals:   02/03/18 1214  PainSc: 0-No pain         Complications: No apparent anesthesia complications

## 2018-02-03 NOTE — Discharge Instructions (Signed)
Dr. Gaynelle Arabian Total Joint Specialist [facility] [address] [place], [place] [address] [phone]  ANTERIOR APPROACH TOTAL HIP REPLACEMENT POSTOPERATIVE DIRECTIONS   Hip Rehabilitation, Guidelines Following Surgery  The results of a hip operation are greatly improved after range of motion and muscle strengthening exercises. Follow all safety measures which are given to protect your hip. If any of these exercises cause increased pain or swelling in your joint, decrease the amount until you are comfortable again. Then slowly increase the exercises. Call your caregiver if you have problems or questions.   HOME CARE INSTRUCTIONS  Remove items at home which could result in a fall. This includes throw rugs or furniture in walking pathways.   ICE to the affected hip every three hours for 30 minutes at a time and then as needed for pain and swelling.  Continue to use ice on the hip for pain and swelling from surgery. You may notice swelling that will progress down to the foot and ankle.  This is normal after surgery.  Elevate the leg when you are not up walking on it.    Continue to use the breathing machine which will help keep your temperature down.  It is common for your temperature to cycle up and down following surgery, especially at night when you are not up moving around and exerting yourself.  The breathing machine keeps your lungs expanded and your temperature down.   DIET You may resume your previous home diet once your are discharged from the hospital.  DRESSING / WOUND CARE / SHOWERING You may change your dressing every day with sterile gauze.  Please use good hand washing techniques before changing the dressing.  Do not use any lotions or creams on the incision until instructed by your surgeon. You may start showering once you are discharged home but do not submerge the incision under water. Just pat the incision dry and apply a dry gauze dressing on  daily. Change the surgical dressing daily and reapply a dry dressing each time.  ACTIVITY Walk with your walker as instructed. Use walker as long as suggested by your caregivers. Avoid periods of inactivity such as sitting longer than an hour when not asleep. This helps prevent blood clots.  You may resume a sexual relationship in one month or when given the OK by your doctor.  You may return to work once you are cleared by your doctor.  Do not drive a car for 6 weeks or until released by you surgeon.  Do not drive while taking narcotics.  WEIGHT BEARING Weight bearing as tolerated with assist device (walker, cane, etc) as directed, use it as long as suggested by your surgeon or therapist, typically at least 4-6 weeks.  POSTOPERATIVE CONSTIPATION PROTOCOL Constipation - defined medically as fewer than three stools per week and severe constipation as less than one stool per week.  One of the most common issues patients have following surgery is constipation.  Even if you have a regular bowel pattern at home, your normal regimen is likely to be disrupted due to multiple reasons following surgery.  Combination of anesthesia, postoperative narcotics, change in appetite and fluid intake all can affect your bowels.  In order to avoid complications following surgery, here are some recommendations in order to help you during your recovery period.  Colace (docusate) - Pick up an over-the-counter form of Colace or another stool softener and take twice a day as long as you are requiring postoperative pain  medications.  Take with a full glass of water daily.  If you experience loose stools or diarrhea, hold the colace until you stool forms back up.  If your symptoms do not get better within 1 week or if they get worse, check with your doctor.  Dulcolax (bisacodyl) - Pick up over-the-counter and take as directed by the product packaging as needed to assist with the movement of your bowels.  Take with a full  glass of water.  Use this product as needed if not relieved by Colace only.   MiraLax (polyethylene glycol) - Pick up over-the-counter to have on hand.  MiraLax is a solution that will increase the amount of water in your bowels to assist with bowel movements.  Take as directed and can mix with a glass of water, juice, soda, coffee, or tea.  Take if you go more than two days without a movement. Do not use MiraLax more than once per day. Call your doctor if you are still constipated or irregular after using this medication for 7 days in a row.  If you continue to have problems with postoperative constipation, please contact the office for further assistance and recommendations.  If you experience "the worst abdominal pain ever" or develop nausea or vomiting, please contact the office immediatly for further recommendations for treatment.  ITCHING  If you experience itching with your medications, try taking only a single pain pill, or even half a pain pill at a time.  You can also use Benadryl over the counter for itching or also to help with sleep.   TED HOSE STOCKINGS Wear the elastic stockings on both legs for three weeks following surgery during the day but you may remove then at night for sleeping.  MEDICATIONS See your medication summary on the After Visit Summary that the nursing staff will review with you prior to discharge.  You may have some home medications which will be placed on hold until you complete the course of blood thinner medication.  It is important for you to complete the blood thinner medication as prescribed by your surgeon.  Continue your approved medications as instructed at time of discharge.  PRECAUTIONS If you experience chest pain or shortness of breath - call 911 immediately for transfer to the hospital emergency department.  If you develop a fever greater that 101 F, purulent drainage from wound, increased redness or drainage from wound, foul odor from the  wound/dressing, or calf pain - CONTACT YOUR SURGEON.                                                   FOLLOW-UP APPOINTMENTS Make sure you keep all of your appointments after your operation with your surgeon and caregivers. You should call the office at the above phone number and make an appointment for approximately two weeks after the date of your surgery or on the date instructed by your surgeon outlined in the "After Visit Summary".  RANGE OF MOTION AND STRENGTHENING EXERCISES  These exercises are designed to help you keep full movement of your hip joint. Follow your caregiver's or physical therapist's instructions. Perform all exercises about fifteen times, three times per day or as directed. Exercise both hips, even if you have had only one joint replacement. These exercises can be done on a training (exercise) mat, on the floor, on  a table or on a bed. Use whatever works the best and is most comfortable for you. Use music or television while you are exercising so that the exercises are a pleasant break in your day. This will make your life better with the exercises acting as a break in routine you can look forward to.  Lying on your back, slowly slide your foot toward your buttocks, raising your knee up off the floor. Then slowly slide your foot back down until your leg is straight again.  Lying on your back spread your legs as far apart as you can without causing discomfort.  Lying on your side, raise your upper leg and foot straight up from the floor as far as is comfortable. Slowly lower the leg and repeat.  Lying on your back, tighten up the muscle in the front of your thigh (quadriceps muscles). You can do this by keeping your leg straight and trying to raise your heel off the floor. This helps strengthen the largest muscle supporting your knee.  Lying on your back, tighten up the muscles of your buttocks both with the legs straight and with the knee bent at a comfortable angle while keeping  your heel on the floor.   IF YOU ARE TRANSFERRED TO A SKILLED REHAB FACILITY If the patient is transferred to a skilled rehab facility following release from the hospital, a list of the current medications will be sent to the facility for the patient to continue.  When discharged from the skilled rehab facility, please have the facility set up the patient's [place] prior to being released. Also, the skilled facility will be responsible for providing the patient with their medications at time of release from the facility to include their pain medication, the muscle relaxants, and their blood thinner medication. If the patient is still at the rehab facility at time of the two week follow up appointment, the skilled rehab facility will also need to assist the patient in arranging follow up appointment in our office and any transportation needs.  MAKE SURE YOU:  Understand these instructions.  Get help right away if you are not doing well or get worse.    Pick up stool softner and laxative for home use following surgery while on pain medications. Do not submerge incision under water. Please use good hand washing techniques while changing dressing each day. May shower starting three days after surgery. Please use a clean towel to pat the incision dry following showers. Continue to use ice for pain and swelling after surgery. Do not use any lotions or creams on the incision until instructed by your surgeon.

## 2018-02-03 NOTE — Anesthesia Postprocedure Evaluation (Signed)
Anesthesia Post Note  Patient: Carita Pian  Procedure(s) Performed: LEFT TOTAL HIP ARTHROPLASTY ANTERIOR APPROACH (Left Hip)     Patient location during evaluation: PACU Anesthesia Type: Spinal Level of consciousness: oriented and awake and alert Pain management: pain level controlled Vital Signs Assessment: post-procedure vital signs reviewed and stable Respiratory status: spontaneous breathing, respiratory function stable and patient connected to nasal cannula oxygen Cardiovascular status: blood pressure returned to baseline and stable Postop Assessment: no headache, no backache and no apparent nausea or vomiting Anesthetic complications: no    Last Vitals:  Vitals:   02/03/18 1730 02/03/18 1803  BP: 116/69 118/69  Pulse: 78 79  Resp: (!) 23 16  Temp: 36.9 C 36.8 C  SpO2: 100% 99%    Last Pain:  Vitals:   02/03/18 1803  TempSrc: Oral  PainSc:                  Kaylei Frink S

## 2018-02-03 NOTE — Op Note (Signed)
OPERATIVE REPORT- TOTAL HIP ARTHROPLASTY   PREOPERATIVE DIAGNOSIS: Osteoarthritis of the Left hip.   POSTOPERATIVE DIAGNOSIS: Osteoarthritis of the Left  hip.   PROCEDURE: Left total hip arthroplasty, anterior approach.   SURGEON: Gaynelle Arabian, MD   ASSISTANT: Theresa Duty, PA-C  ANESTHESIA:  Spinal  ESTIMATED BLOOD LOSS:-400 mL    DRAINS: Hemovac x1.   COMPLICATIONS: None   CONDITION: PACU - hemodynamically stable.   BRIEF CLINICAL NOTE: [name] is a 67 y.o. female who has advanced end-  stage arthritis of their Left  hip with progressively worsening pain and  dysfunction.The patient has failed nonoperative management and presents for  total hip arthroplasty.   PROCEDURE IN DETAIL: After successful administration of spinal  anesthetic, the traction boots for the [facility] bed were placed on both  feet and the patient was placed onto the [facility] bed, boots placed into the leg  holders. The Left hip was then isolated from the perineum with plastic  drapes and prepped and draped in the usual sterile fashion. ASIS and  greater trochanter were marked and a oblique incision was made, starting  at about 1 cm lateral and 2 cm distal to the ASIS and coursing towards  the anterior cortex of the femur. The skin was cut with a 10 blade  through subcutaneous tissue to the level of the fascia overlying the  tensor fascia lata muscle. The fascia was then incised in line with the  incision at the junction of the anterior third and posterior 2/3rd. The  muscle was teased off the fascia and then the interval between the TFL  and the rectus was developed. The Hohmann retractor was then placed at  the top of the femoral neck over the capsule. The vessels overlying the  capsule were cauterized and the fat on top of the capsule was removed.  A Hohmann retractor was then placed anterior underneath the rectus  femoris to give exposure to the entire anterior capsule. A T-shaped   capsulotomy was performed. The edges were tagged and the femoral head  was identified.       Osteophytes are removed off the superior acetabulum.  The femoral neck was then cut in situ with an oscillating saw. Traction  was then applied to the left lower extremity utilizing the [facility]  traction. The femoral head was then removed. Retractors were placed  around the acetabulum and then circumferential removal of the labrum was  performed. Osteophytes were also removed. Reaming starts at 45 mm to  medialize and  Increased in 2 mm increments to 47 mm. We reamed in  approximately 40 degrees of abduction, 20 degrees anteversion. A 48 mm  pinnacle acetabular shell was then impacted in anatomic position under  fluoroscopic guidance with excellent purchase. We did not need to place  any additional dome screws. A 28 MM neutral + 4 marathon liner was then  placed into the acetabular shell.       The femoral lift was then placed along the lateral aspect of the femur  just distal to the vastus ridge. The leg was  externally rotated and capsule  was stripped off the inferior aspect of the femoral neck down to the  level of the lesser trochanter, this was done with electrocautery. The femur was lifted after this was performed. The  leg was then placed in an extended and adducted position essentially delivering the femur. We also removed the capsule superiorly and the piriformis from the piriformis fossa to gain excellent  exposure of the  proximal femur. Rongeur was used to remove some cancellous bone to get  into the lateral portion of the proximal femur for placement of the  initial starter reamer. The starter broaches was placed  the starter broach  and was shown to go down the center of the canal. Broaching  with the Corail system was then performed starting at size 8  coursing  Up to size 11. A size 11 had excellent torsional and rotational  and axial stability. The trial high offset neck was then  placed  with a 28 + 5 trial head. The hip was then reduced. We confirmed that  the stem was in the canal both on AP and lateral x-rays. It also has excellent sizing. The hip was reduced with outstanding stability through full extension and full external rotation.. AP pelvis was taken and the leg lengths were measured and found to be equal. Hip was then dislocated again and the femoral head and neck removed. The  femoral broach was removed. Size 11 Corail stem with a high offset  neck was then impacted into the femur following native anteversion. Has  excellent purchase in the canal. Excellent torsional and rotational and  axial stability. It is confirmed to be in the canal on AP and lateral  fluoroscopic views. The 28 + 5 ceramic head was placed and the hip  reduced with outstanding stability. Again AP pelvis was taken and it  confirmed that the leg lengths were equal. The wound was then copiously  irrigated with saline solution and the capsule reattached and repaired  with Ethibond suture. 30 ml of .25% Bupivicaine was  injected into the capsule and into the edge of the tensor fascia lata as well as subcutaneous tissue. The fascia overlying the tensor fascia lata was then closed with a running #1 V-Loc. Subcu was closed with interrupted 2-0 Vicryl and subcuticular running 4-0 Monocryl. Incision was cleaned  and dried. Steri-Strips and a bulky sterile dressing applied. Hemovac  drain was hooked to suction and then the patient was awakened and transported to  recovery in stable condition.        Please note that a surgical assistant was a medical necessity for this procedure to perform it in a safe and expeditious manner. Assistant was necessary to provide appropriate retraction of vital neurovascular structures and to prevent femoral fracture and allow for anatomic placement of the prosthesis.  Gaynelle Arabian, M.D.

## 2018-02-03 NOTE — Anesthesia Preprocedure Evaluation (Signed)
Anesthesia Evaluation  Patient identified by MRN, date of birth, ID band Patient awake    Reviewed: Allergy & Precautions, NPO status , Patient's Chart, lab work & pertinent test results  Airway Mallampati: II  TM Distance: >3 FB Neck ROM: Full    Dental no notable dental hx.    Pulmonary neg pulmonary ROS, former smoker,    Pulmonary exam normal breath sounds clear to auscultation       Cardiovascular negative cardio ROS Normal cardiovascular exam Rhythm:Regular Rate:Normal     Neuro/Psych negative neurological ROS  negative psych ROS   GI/Hepatic Neg liver ROS, GERD  Medicated,  Endo/Other  negative endocrine ROS  Renal/GU negative Renal ROS  negative genitourinary   Musculoskeletal  (+) Arthritis , Rheumatoid disorders and steroids,    Abdominal   Peds negative pediatric ROS (+)  Hematology negative hematology ROS (+)   Anesthesia Other Findings   Reproductive/Obstetrics negative OB ROS                             Anesthesia Physical Anesthesia Plan  ASA: II  Anesthesia Plan: Spinal   Post-op Pain Management:    Induction: Intravenous  PONV Risk Score and Plan: 2 and Ondansetron and Dexamethasone  Airway Management Planned: Simple Face Mask  Additional Equipment:   Intra-op Plan:   Post-operative Plan:   Informed Consent: I have reviewed the patients History and Physical, chart, labs and discussed the procedure including the risks, benefits and alternatives for the proposed anesthesia with the patient or authorized representative who has indicated his/her understanding and acceptance.   Dental advisory given  Plan Discussed with: CRNA and Surgeon  Anesthesia Plan Comments:         Anesthesia Quick Evaluation

## 2018-02-03 NOTE — Anesthesia Procedure Notes (Signed)
Spinal  Patient location during procedure: OR End time: 02/03/2018 3:00 PM Staffing Resident/CRNA: Caryl Pina T, CRNA Performed: resident/CRNA  Preanesthetic Checklist Completed: patient identified, site marked, surgical consent, pre-op evaluation, timeout performed, IV checked, risks and benefits discussed and monitors and equipment checked Spinal Block Patient position: sitting Prep: DuraPrep Patient monitoring: heart rate, cardiac monitor, blood pressure and continuous pulse ox Approach: midline Location: L3-4 Injection technique: single-shot Needle Needle type: Pencan  Needle gauge: 24 G Needle length: 9 cm Assessment Sensory level: T6 Additional Notes Expiration date of kit checked and confirmed. Patient tolerated procedure well, without complications.

## 2018-02-03 NOTE — Interval H&P Note (Signed)
History and Physical Interval Note:  02/03/2018 12:52 PM  [name]  has presented today for surgery, with the diagnosis of left hip osteoarthritis  The various methods of treatment have been discussed with the patient and family. After consideration of risks, benefits and other options for treatment, the patient has consented to  Procedure(s): LEFT TOTAL HIP ARTHROPLASTY ANTERIOR APPROACH (Left) as a surgical intervention .  The patient's history has been reviewed, patient examined, no change in status, stable for surgery.  I have reviewed the patient's chart and labs.  Questions were answered to the patient's satisfaction.     Pilar Plate Yulanda Diggs

## 2018-02-04 ENCOUNTER — Encounter (HOSPITAL_COMMUNITY): Payer: Self-pay | Admitting: Orthopedic Surgery

## 2018-02-04 LAB — BASIC METABOLIC PANEL
Anion gap: 7 (ref 5–15)
BUN: 8 mg/dL (ref 8–23)
CALCIUM: 9.2 mg/dL (ref 8.9–10.3)
CO2: 27 mmol/L (ref 22–32)
CREATININE: 0.57 mg/dL (ref 0.44–1.00)
Chloride: 108 mmol/L (ref 98–111)
GFR calc Af Amer: >60 mL/min (ref >60)
Glucose, Bld: 185 mg/dL — ABNORMAL HIGH (ref 70–99)
POTASSIUM: 3.5 mmol/L (ref 3.5–5.1)
SODIUM: 142 mmol/L (ref 135–145)

## 2018-02-04 LAB — CBC
HCT: 34.4 % — ABNORMAL LOW (ref 36.0–46.0)
HEMOGLOBIN: 12 g/dL (ref 12.0–15.0)
MCH: 31.4 pg (ref 26.0–34.0)
MCHC: 34.9 g/dL (ref 30.0–36.0)
MCV: 90.1 fL (ref 78.0–100.0)
Platelets: 212 10*3/uL (ref 150–400)
RBC: 3.82 MIL/uL — AB (ref 3.87–5.11)
RDW: 12.7 % (ref 11.5–15.5)
WBC: 6.4 10*3/uL (ref 4.0–10.5)

## 2018-02-04 MED ORDER — ASPIRIN 325 MG PO TBEC: 325.0000 mg | DELAYED_RELEASE_TABLET | Freq: Two times a day (BID) | ORAL | 0 refills | Status: AC

## 2018-02-04 MED ORDER — METHOCARBAMOL 500 MG PO TABS: 500.0000 mg | ORAL_TABLET | Freq: Four times a day (QID) | ORAL | 0 refills | Status: AC | PRN

## 2018-02-04 MED ORDER — HYDROCODONE-ACETAMINOPHEN 5-325 MG PO TABS: 1.0000 | ORAL_TABLET | Freq: Four times a day (QID) | ORAL | 0 refills | Status: AC | PRN

## 2018-02-04 NOTE — Progress Notes (Signed)
   Subjective: 1 Day Post-Op Procedure(s) (LRB): LEFT TOTAL HIP ARTHROPLASTY ANTERIOR APPROACH (Left) Patient reports pain as mild.   Patient seen in rounds with Dr. Wynelle Link. Patient is well, and has had no acute complaints or problems other than soreness in the left hip. Denies chest pain or SOB. States she is ready to go home pending progress with therapy. Foley catheter removed this AM. We will start therapy today.   Objective: Vital signs in last 24 hours: Temp:  [97.7 F (36.5 C)-98.6 F (37 C)] 98.6 F (37 C) (08/29 0538) Pulse Rate:  [69-88] 88 (08/29 0538) Resp:  [12-23] 16 (08/28 2058) BP: (99-138)/(63-77) 131/75 (08/29 0538) SpO2:  [94 %-100 %] 98 % (08/29 0538) Weight:  [83.9 kg] 83.9 kg (08/28 1214)  Intake/Output from previous day:  Intake/Output Summary (Last 24 hours) at 02/04/2018 0726 Last data filed at 02/04/2018 0601 Gross per 24 hour  Intake 3529.78 ml  Output 2985 ml  Net 544.78 ml     Labs: Recent Labs    02/04/18 0452  HGB 12.0   Recent Labs    02/04/18 0452  WBC 6.4  RBC 3.82*  HCT 34.4*  PLT 212   Recent Labs    02/04/18 0452  NA 142  K 3.5  CL 108  CO2 27  BUN 8  CREATININE 0.57  GLUCOSE 185*  CALCIUM 9.2   Exam: General - Patient is Alert and Oriented Extremity - Neurologically intact Neurovascular intact Sensation intact distally Dorsiflexion/Plantar flexion intact Dressing - dressing C/D/I Motor Function - intact, moving foot and toes well on exam.   Past Medical History:  Diagnosis Date  . Anemia   . Anxiety   . Blood transfusion   . Calcifying tendinitis of shoulder   . Cataracts, bilateral   . Depression   . Diverticulosis   . Fibromyalgia   . GERD (gastroesophageal reflux disease)   . Joint effusion   . Myalgia   . Myalgia and myositis   . Rheumatoid arthritis(714.0)   . Rheumatoid arthritis(714.0)     Assessment/Plan: 1 Day Post-Op Procedure(s) (LRB): LEFT TOTAL HIP ARTHROPLASTY ANTERIOR APPROACH  (Left) Principal Problem:   OA (osteoarthritis) of hip  Estimated body mass index is 33.84 kg/m as calculated from the following:   Height as of this encounter: 5\' 2"  (1.575 m).   Weight as of this encounter: 83.9 kg. Advance diet Up with therapy D/C IV fluids  DVT Prophylaxis - Aspirin Weight bearing as tolerated. D/C O2 and pulse ox and try on room air. Hemovac pulled without difficulty, will begin therapy.  Plan is to go Home after hospital stay. Plan for discharge later today after therapy if meeting goals with home exercise program. Follow-up in the office in 2 weeks with Dr. Wynelle Link.  Theresa Duty, PA-C Orthopedic Surgery 02/04/2018, 7:26 AM

## 2018-02-04 NOTE — Plan of Care (Signed)
Plan of care discussed with patient 

## 2018-02-04 NOTE — Progress Notes (Signed)
Physical Therapy Treatment Patient Details Name: [name] MRN: [id number] DOB: 1950/11/15 Today's Date: 02/04/2018    History of Present Illness Pt s/p L THR and with hx of L TKR, R wrist fusion, RA and Fibromyalgia    PT Comments    Pt continues cooperative but reports increased pain with WB but not as severe as this am.   Follow Up Recommendations  Follow surgeon's recommendation for DC plan and follow-up therapies     Equipment Recommendations  None recommended by PT    Recommendations for Other Services       Precautions / Restrictions Precautions Precautions: Fall Restrictions Weight Bearing Restrictions: No Other Position/Activity Restrictions: WBAT    Mobility  Bed Mobility Overal bed mobility: Needs Assistance Bed Mobility: Supine to Sit;Sit to Supine     Supine to sit: Min assist Sit to supine: Min guard   General bed mobility comments: cues for sequence and use of R LE to self assist  Transfers Overall transfer level: Needs assistance Equipment used: Rolling walker (2 wheeled) Transfers: Sit to/from Stand Sit to Stand: Min guard         General transfer comment: cues for LE management and use of UEs to self assist  Ambulation/Gait Ambulation/Gait assistance: Min assist;Min guard Gait Distance (Feet): 45 Feet Assistive device: Rolling walker (2 wheeled) Gait Pattern/deviations: Step-to pattern;Decreased step length - right;Decreased step length - left;Shuffle Gait velocity: dec   General Gait Details: cues for sequence, posture and position from RW - distance ltd by increased pain   Stairs             Wheelchair Mobility    Modified Rankin (Stroke Patients Only)       Balance Overall balance assessment: Mild deficits observed, not formally tested                                          Cognition Arousal/Alertness: Awake/alert Behavior During Therapy: WFL for tasks assessed/performed Overall  Cognitive Status: Within Functional Limits for tasks assessed                                        Exercises Total Joint Exercises Ankle Circles/Pumps: AROM;Both;15 reps;Supine Quad Sets: AROM;Both;10 reps;Supine Heel Slides: AAROM;Left;20 reps;Supine Hip ABduction/ADduction: AAROM;Left;15 reps;Supine    General Comments        Pertinent Vitals/Pain Pain Assessment: 0-10 Pain Score: 5  Pain Location: L hip with WB Pain Descriptors / Indicators: Aching;Sore;Spasm Pain Intervention(s): Limited activity within patient's tolerance;Monitored during session;Premedicated before session;Ice applied    Home Living                      Prior Function            PT Goals (current goals can now be found in the care plan section) Acute Rehab PT Goals Patient Stated Goal: Regain IND PT Goal Formulation: With patient Time For Goal Achievement: 02/11/18 Potential to Achieve Goals: Good Progress towards PT goals: Progressing toward goals    Frequency    7X/week      PT Plan Current plan remains appropriate    Co-evaluation              AM-PAC PT "6 Clicks" Daily Activity  Outcome Measure  Difficulty turning over  in bed (including adjusting bedclothes, sheets and blankets)?: A Lot Difficulty moving from lying on back to sitting on the side of the bed? : A Lot Difficulty sitting down on and standing up from a chair with arms (e.g., wheelchair, bedside commode, etc,.)?: A Lot Help needed moving to and from a bed to chair (including a wheelchair)?: A Little Help needed walking in hospital room?: A Little Help needed climbing 3-5 steps with a railing? : A Lot 6 Click Score: 14    End of Session Equipment Utilized During Treatment: Gait belt Activity Tolerance: Patient limited by pain Patient left: in bed;with call bell/phone within reach;with nursing/sitter in room Nurse Communication: Mobility status PT Visit Diagnosis: Difficulty in  walking, not elsewhere classified (R26.2);Pain Pain - Right/Left: Left Pain - part of body: Hip     Time: 8110-3159 PT Time Calculation (min) (ACUTE ONLY): 30 min  Charges:  $Gait Training: 8-22 mins $Therapeutic Exercise: 8-22 mins                     Pg [phone]    Andrik Sandt 02/04/2018, 5:08 PM

## 2018-02-04 NOTE — Evaluation (Signed)
Physical Therapy Evaluation Patient Details Name: [name] MRN: [id number] DOB: 1951-05-15 Today's Date: 02/04/2018   History of Present Illness  Pt s/p L THR and with hx of L TKR, R wrist fusion, RA and Fibromyalgia  Clinical Impression  Pt s/p L THR and presents with decreased L LE strength/ROM and post op pain limiting functional mobility.  Pt should progress to dc home with family assist.    Follow Up Recommendations Follow surgeon's recommendation for DC plan and follow-up therapies    Equipment Recommendations  None recommended by PT    Recommendations for Other Services       Precautions / Restrictions Precautions Precautions: Fall Restrictions Weight Bearing Restrictions: No Other Position/Activity Restrictions: WBAT      Mobility  Bed Mobility Overal bed mobility: Needs Assistance Bed Mobility: Supine to Sit;Sit to Supine     Supine to sit: Min assist Sit to supine: Min assist   General bed mobility comments: cues for sequence and use of R LE to self assist  Transfers Overall transfer level: Needs assistance Equipment used: Rolling walker (2 wheeled) Transfers: Sit to/from Stand Sit to Stand: Min assist         General transfer comment: cues for LE management and use of UEs to self assist  Ambulation/Gait Ambulation/Gait assistance: Min assist Gait Distance (Feet): 6 Feet Assistive device: Rolling walker (2 wheeled) Gait Pattern/deviations: Step-to pattern;Decreased step length - right;Decreased step length - left;Shuffle Gait velocity: dec   General Gait Details: cues for sequence, posture and position from RW - distance ltd by increased pain  Stairs            Wheelchair Mobility    Modified Rankin (Stroke Patients Only)       Balance Overall balance assessment: Mild deficits observed, not formally tested                                           Pertinent Vitals/Pain Pain Assessment: 0-10 Pain  Score: 7  Pain Location: L hip with WB Pain Descriptors / Indicators: Aching;Sore;Spasm Pain Intervention(s): Limited activity within patient's tolerance;Monitored during session;Premedicated before session;Ice applied    Home Living Family/patient expects to be discharged to:: Private residence Living Arrangements: Alone Available Help at Discharge: Available 24 hours/day;Family Type of Home: House Home Access: Level entry     Home Layout: One level Home Equipment: Environmental consultant - 2 wheels;Cane - single point;Bedside commode Additional Comments: Pt will be staying at mothers house with her sister and her mother    Prior Function Level of Independence: Independent;Independent with assistive device(s)               Hand Dominance        Extremity/Trunk Assessment   Upper Extremity Assessment Upper Extremity Assessment: Overall WFL for tasks assessed    Lower Extremity Assessment Lower Extremity Assessment: LLE deficits/detail LLE Deficits / Details: Strength at hip 2+/5 with AAROM at hip to 90 flex and 20 abd       Communication   Communication: No difficulties  Cognition Arousal/Alertness: Awake/alert Behavior During Therapy: WFL for tasks assessed/performed Overall Cognitive Status: Within Functional Limits for tasks assessed                                        General  Comments      Exercises Total Joint Exercises Ankle Circles/Pumps: AROM;Both;15 reps;Supine Quad Sets: AROM;Both;10 reps;Supine Heel Slides: AAROM;Left;20 reps;Supine Hip ABduction/ADduction: AAROM;Left;15 reps;Supine   Assessment/Plan    PT Assessment Patient needs continued PT services  PT Problem List Decreased strength;Decreased range of motion;Decreased activity tolerance;Decreased mobility;Decreased knowledge of use of DME;Pain       PT Treatment Interventions DME instruction;Gait training;Functional mobility training;Therapeutic activities;Therapeutic  exercise;Patient/family education    PT Goals (Current goals can be found in the Care Plan section)  Acute Rehab PT Goals Patient Stated Goal: Regain IND PT Goal Formulation: With patient Time For Goal Achievement: 02/11/18 Potential to Achieve Goals: Good    Frequency 7X/week   Barriers to discharge        Co-evaluation               AM-PAC PT "6 Clicks" Daily Activity  Outcome Measure Difficulty turning over in bed (including adjusting bedclothes, sheets and blankets)?: Unable Difficulty moving from lying on back to sitting on the side of the bed? : Unable Difficulty sitting down on and standing up from a chair with arms (e.g., wheelchair, bedside commode, etc,.)?: Unable Help needed moving to and from a bed to chair (including a wheelchair)?: A Little Help needed walking in hospital room?: A Little Help needed climbing 3-5 steps with a railing? : A Lot 6 Click Score: 11    End of Session Equipment Utilized During Treatment: Gait belt Activity Tolerance: Patient limited by pain Patient left: in bed;with call bell/phone within reach;with nursing/sitter in room Nurse Communication: Mobility status PT Visit Diagnosis: Difficulty in walking, not elsewhere classified (R26.2);Pain Pain - Right/Left: Left Pain - part of body: Hip    Time: 8206-0156 PT Time Calculation (min) (ACUTE ONLY): 32 min   Charges:   PT Evaluation $PT Eval Low Complexity: 1 Low PT Treatments $Therapeutic Exercise: 8-22 mins        Pg [phone]   [name] 02/04/2018, 11:58 AM

## 2018-02-05 LAB — CBC
HCT: 33.4 % — ABNORMAL LOW (ref 36.0–46.0)
Hemoglobin: 11.5 g/dL — ABNORMAL LOW (ref 12.0–15.0)
MCH: 31.2 pg (ref 26.0–34.0)
MCHC: 34.4 g/dL (ref 30.0–36.0)
MCV: 90.5 fL (ref 78.0–100.0)
PLATELETS: 196 10*3/uL (ref 150–400)
RBC: 3.69 MIL/uL — AB (ref 3.87–5.11)
RDW: 12.8 % (ref 11.5–15.5)
WBC: 8.2 10*3/uL (ref 4.0–10.5)

## 2018-02-05 LAB — BASIC METABOLIC PANEL
ANION GAP: 9 (ref 5–15)
BUN: 10 mg/dL (ref 8–23)
CO2: 27 mmol/L (ref 22–32)
Calcium: 9 mg/dL (ref 8.9–10.3)
Chloride: 106 mmol/L (ref 98–111)
Creatinine, Ser: 0.45 mg/dL (ref 0.44–1.00)
Glucose, Bld: 114 mg/dL — ABNORMAL HIGH (ref 70–99)
POTASSIUM: 3.6 mmol/L (ref 3.5–5.1)
SODIUM: 142 mmol/L (ref 135–145)

## 2018-02-05 NOTE — Plan of Care (Signed)
Patient waiting on ride home. All discharge instructions given to the patient, she verbalized understanding. IV removed. Rx given. Patient in stable condition and ready to go home.

## 2018-02-05 NOTE — Progress Notes (Signed)
Physical Therapy Treatment Patient Details Name: [name] MRN: [id number] DOB: [birth year] Today's Date: 02/05/2018    History of Present Illness Pt s/p L THR and with hx of L TKR, R wrist fusion, RA and Fibromyalgia    PT Comments    Pt continues cooperative and progressing steadily with mobility but continues ltd by increased pain with WB.  Reviewed car transfers and home therex program with progression and written instruction provided.     Follow Up Recommendations  Follow surgeon's recommendation for DC plan and follow-up therapies     Equipment Recommendations  None recommended by PT    Recommendations for Other Services       Precautions / Restrictions Precautions Precautions: Fall Restrictions Weight Bearing Restrictions: No Other Position/Activity Restrictions: WBAT    Mobility  Bed Mobility Overal bed mobility: Needs Assistance Bed Mobility: Supine to Sit;Sit to Supine     Supine to sit: Supervision Sit to supine: Supervision   General bed mobility comments: cues for sequence and use of R LE to self assist  Transfers Overall transfer level: Needs assistance Equipment used: Rolling walker (2 wheeled) Transfers: Sit to/from Stand Sit to Stand: Supervision         General transfer comment: cues for LE management and use of UEs to self assist  Ambulation/Gait Ambulation/Gait assistance: Min guard;Supervision Gait Distance (Feet): 52 Feet Assistive device: Rolling walker (2 wheeled) Gait Pattern/deviations: Step-to pattern;Decreased step length - right;Decreased step length - left;Shuffle Gait velocity: dec   General Gait Details: cues for sequence, posture and position from RW - distance ltd by increased pain   Stairs             Wheelchair Mobility    Modified Rankin (Stroke Patients Only)       Balance Overall balance assessment: Mild deficits observed, not formally tested                                          Cognition Arousal/Alertness: Awake/alert Behavior During Therapy: WFL for tasks assessed/performed Overall Cognitive Status: Within Functional Limits for tasks assessed                                        Exercises Total Joint Exercises Ankle Circles/Pumps: AROM;Both;15 reps;Supine Quad Sets: AROM;Both;10 reps;Supine Heel Slides: AAROM;Left;20 reps;Supine Hip ABduction/ADduction: AAROM;Left;15 reps;Supine Long Arc Quad: AROM;Left;10 reps;Seated    General Comments        Pertinent Vitals/Pain Pain Assessment: 0-10 Pain Score: 5  Pain Location: L hip with WB Pain Descriptors / Indicators: Aching;Sore;Spasm Pain Intervention(s): Limited activity within patient's tolerance;Monitored during session;Premedicated before session;Ice applied    Home Living                      Prior Function            PT Goals (current goals can now be found in the care plan section) Acute Rehab PT Goals Patient Stated Goal: Regain IND PT Goal Formulation: With patient Time For Goal Achievement: 02/11/18 Potential to Achieve Goals: Good Progress towards PT goals: Progressing toward goals    Frequency    7X/week      PT Plan Current plan remains appropriate    Co-evaluation  AM-PAC PT "6 Clicks" Daily Activity  Outcome Measure  Difficulty turning over in bed (including adjusting bedclothes, sheets and blankets)?: A Lot Difficulty moving from lying on back to sitting on the side of the bed? : A Lot Difficulty sitting down on and standing up from a chair with arms (e.g., wheelchair, bedside commode, etc,.)?: A Lot Help needed moving to and from a bed to chair (including a wheelchair)?: A Little Help needed walking in hospital room?: A Little Help needed climbing 3-5 steps with a railing? : A Lot 6 Click Score: 14    End of Session Equipment Utilized During Treatment: Gait belt Activity Tolerance: Patient limited by  pain;Patient tolerated treatment well Patient left: in bed;with call bell/phone within reach Nurse Communication: Mobility status PT Visit Diagnosis: Difficulty in walking, not elsewhere classified (R26.2);Pain Pain - Right/Left: Left Pain - part of body: Hip     Time: 5183-3582 PT Time Calculation (min) (ACUTE ONLY): 39 min  Charges:  $Gait Training: 8-22 mins $Therapeutic Exercise: 8-22 mins                     Pg [phone]    Shiann Kam 02/05/2018, 3:14 PM

## 2018-02-05 NOTE — Progress Notes (Signed)
   Subjective: 2 Days Post-Op Procedure(s) (LRB): LEFT TOTAL HIP ARTHROPLASTY ANTERIOR APPROACH (Left) Patient reports pain as mild.   Patient seen in rounds for Dr. Wynelle Link. Patient is well, and has had no acute complaints or problems. No SOB or chest pain. Voiding well. Positive flatus. Feeling better this morning. Anxious to go home.  We will resume therapy today.  Plan is to go Home after hospital stay.  Objective: Vital signs in last 24 hours: Temp:  [98 F (36.7 C)-98.4 F (36.9 C)] 98 F (36.7 C) (08/30 0531) Pulse Rate:  [80-96] 80 (08/30 0531) Resp:  [16-17] 16 (08/30 0531) BP: (124-137)/(70-77) 124/77 (08/30 0531) SpO2:  [97 %-99 %] 99 % (08/30 0531)  Intake/Output from previous day:  Intake/Output Summary (Last 24 hours) at 02/05/2018 0751 Last data filed at 02/05/2018 0534 Gross per 24 hour  Intake 1132.18 ml  Output 350 ml  Net 782.18 ml     Labs: Recent Labs    02/04/18 0452 02/05/18 0457  HGB 12.0 11.5*   Recent Labs    02/04/18 0452 02/05/18 0457  WBC 6.4 8.2  RBC 3.82* 3.69*  HCT 34.4* 33.4*  PLT 212 196   Recent Labs    02/04/18 0452  NA 142  K 3.5  CL 108  CO2 27  BUN 8  CREATININE 0.57  GLUCOSE 185*  CALCIUM 9.2    EXAM General - Patient is Alert and Oriented Extremity - Neurologically intact Intact pulses distally Dorsiflexion/Plantar flexion intact No cellulitis present Compartment soft Dressing - no drainage Motor Function - intact, moving foot and toes well on exam.   Past Medical History:  Diagnosis Date  . Anemia   . Anxiety   . Blood transfusion   . Calcifying tendinitis of shoulder   . Cataracts, bilateral   . Depression   . Diverticulosis   . Fibromyalgia   . GERD (gastroesophageal reflux disease)   . Joint effusion   . Myalgia   . Myalgia and myositis   . Rheumatoid arthritis(714.0)   . Rheumatoid arthritis(714.0)     Assessment/Plan: 2 Days Post-Op Procedure(s) (LRB): LEFT TOTAL HIP ARTHROPLASTY  ANTERIOR APPROACH (Left) Principal Problem:   OA (osteoarthritis) of hip  Estimated body mass index is 33.84 kg/m as calculated from the following:   Height as of this encounter: 5\' 2"  (1.575 m).   Weight as of this encounter: 83.9 kg. Advance diet Up with therapy Discharge home   DVT Prophylaxis - Aspirin Weight Bearing As Tolerated    Work with therapy today. Plan for DC home with HEP if she progresses. Follow up in office in 2 weeks.   Ardeen Jourdain, PA-C Orthopaedic Surgery 02/05/2018, 7:51 AM

## 2018-02-09 NOTE — Discharge Summary (Signed)
Physician Discharge Summary   Patient ID: [name] MRN: [id number] DOB/AGE: 11-Aug-1950 67 y.o.  Admit date: 02/03/2018 Discharge date: 02/05/2018  Primary Diagnosis: Primary osteoarthritis left hip  Admission Diagnoses:  Past Medical History:  Diagnosis Date  . Anemia   . Anxiety   . Blood transfusion   . Calcifying tendinitis of shoulder   . Cataracts, bilateral   . Depression   . Diverticulosis   . Fibromyalgia   . GERD (gastroesophageal reflux disease)   . Joint effusion   . Myalgia   . Myalgia and myositis   . Rheumatoid arthritis(714.0)   . Rheumatoid arthritis(714.0)    Discharge Diagnoses:   Principal Problem:   OA (osteoarthritis) of hip  Estimated body mass index is 33.84 kg/m as calculated from the following:   Height as of this encounter: 5' 2"  (1.575 m).   Weight as of this encounter: 83.9 kg.  Procedure(s) (LRB): LEFT TOTAL HIP ARTHROPLASTY ANTERIOR APPROACH (Left)   Consults: None  HPI: [place], 67 y.o. female, has a history of pain and functional disability in the left hip(s) due to arthritis and patient has failed non-surgical conservative treatments for greater than 12 weeks to include corticosteriod injections, use of assistive devices, activity modification and pain management.  Onset of symptoms was abrupt starting approximately 6 months ago with gradually worsening course since that time.The patient noted no past surgery on the left hip(s).  Patient currently rates pain in the left hip at 9 out of 10 with activity. Patient has night pain, worsening of pain with activity and weight bearing, pain that interfers with activities of daily living and instability. Patient has evidence of joint space narrowing by imaging studies. This condition presents safety issues increasing the risk of falls. There is no current active infection. Laboratory Data: Admission on 02/03/2018, Discharged on 02/05/2018  Component Date Value Ref Range Status    . WBC 02/04/2018 6.4  4.0 - 10.5 K/uL Final  . RBC 02/04/2018 3.82* 3.87 - 5.11 MIL/uL Final  . Hemoglobin 02/04/2018 12.0  12.0 - 15.0 g/dL Final  . HCT 02/04/2018 34.4* 36.0 - 46.0 % Final  . MCV 02/04/2018 90.1  78.0 - 100.0 fL Final  . MCH 02/04/2018 31.4  26.0 - 34.0 pg Final  . MCHC 02/04/2018 34.9  30.0 - 36.0 g/dL Final  . RDW 02/04/2018 12.7  11.5 - 15.5 % Final  . Platelets 02/04/2018 212  150 - 400 K/uL Final   Performed at [facility], [place] [address] [place], [place] [address]  . Sodium 02/04/2018 142  135 - 145 mmol/L Final  . Potassium 02/04/2018 3.5  3.5 - 5.1 mmol/L Final  . Chloride 02/04/2018 108  98 - 111 mmol/L Final  . CO2 02/04/2018 27  22 - 32 mmol/L Final  . Glucose, Bld 02/04/2018 185* 70 - 99 mg/dL Final  . BUN 02/04/2018 8  8 - 23 mg/dL Final  . Creatinine, Ser 02/04/2018 0.57  0.44 - 1.00 mg/dL Final  . Calcium 02/04/2018 9.2  8.9 - 10.3 mg/dL Final  . GFR calc non Af Amer 02/04/2018 >60  >60 mL/min Final  . GFR calc Af Amer 02/04/2018 >60  >60 mL/min Final   Comment: (NOTE) The eGFR has been calculated using the CKD EPI equation. This calculation has not been validated in all clinical situations. eGFR's persistently <60 mL/min signify possible Chronic Kidney Disease.   . Anion gap 02/04/2018 7  5 - 15 Final   Performed  at [facility], [place] [address] [place], [place] [address]  . WBC 02/05/2018 8.2  4.0 - 10.5 K/uL Final  . RBC 02/05/2018 3.69* 3.87 - 5.11 MIL/uL Final  . Hemoglobin 02/05/2018 11.5* 12.0 - 15.0 g/dL Final  . HCT 02/05/2018 33.4* 36.0 - 46.0 % Final  . MCV 02/05/2018 90.5  78.0 - 100.0 fL Final  . MCH 02/05/2018 31.2  26.0 - 34.0 pg Final  . MCHC 02/05/2018 34.4  30.0 - 36.0 g/dL Final  . RDW 02/05/2018 12.8  11.5 - 15.5 % Final  . Platelets 02/05/2018 196  150 - 400 K/uL Final   Performed at [facility], [place] [address] [place], [place] [address]  . Sodium 02/05/2018 142   135 - 145 mmol/L Final  . Potassium 02/05/2018 3.6  3.5 - 5.1 mmol/L Final  . Chloride 02/05/2018 106  98 - 111 mmol/L Final  . CO2 02/05/2018 27  22 - 32 mmol/L Final  . Glucose, Bld 02/05/2018 114* 70 - 99 mg/dL Final  . BUN 02/05/2018 10  8 - 23 mg/dL Final  . Creatinine, Ser 02/05/2018 0.45  0.44 - 1.00 mg/dL Final  . Calcium 02/05/2018 9.0  8.9 - 10.3 mg/dL Final  . GFR calc non Af Amer 02/05/2018 >60  >60 mL/min Final  . GFR calc Af Amer 02/05/2018 >60  >60 mL/min Final   Comment: (NOTE) The eGFR has been calculated using the CKD EPI equation. This calculation has not been validated in all clinical situations. eGFR's persistently <60 mL/min signify possible Chronic Kidney Disease.   Georgiann Hahn gap 02/05/2018 9  5 - 15 Final   Performed at [facility], [place] [address] [place], [place] [address]  Hospital Outpatient Visit on 01/27/2018  Component Date Value Ref Range Status  . aPTT 01/27/2018 29  24 - 36 seconds Final   Performed at [facility], [place] [address] [place], [place] [address]  . WBC 01/27/2018 5.5  4.0 - 10.5 K/uL Final  . RBC 01/27/2018 4.41  3.87 - 5.11 MIL/uL Final  . Hemoglobin 01/27/2018 13.9  12.0 - 15.0 g/dL Final  . HCT 01/27/2018 40.3  36.0 - 46.0 % Final  . MCV 01/27/2018 91.4  78.0 - 100.0 fL Final  . MCH 01/27/2018 31.5  26.0 - 34.0 pg Final  . MCHC 01/27/2018 34.5  30.0 - 36.0 g/dL Final  . RDW 01/27/2018 12.7  11.5 - 15.5 % Final  . Platelets 01/27/2018 189  150 - 400 K/uL Final   Performed at [facility], [place] [address] [place], [place] [address]  . Sodium 01/27/2018 144  135 - 145 mmol/L Final  . Potassium 01/27/2018 4.0  3.5 - 5.1 mmol/L Final  . Chloride 01/27/2018 107  98 - 111 mmol/L Final  . CO2 01/27/2018 29  22 - 32 mmol/L Final  . Glucose, Bld 01/27/2018 125* 70 - 99 mg/dL Final  . BUN 01/27/2018 10  8 - 23 mg/dL Final  . Creatinine, Ser 01/27/2018 0.64  0.44 - 1.00 mg/dL Final  .  Calcium 01/27/2018 9.6  8.9 - 10.3 mg/dL Final  . Total Protein 01/27/2018 7.2  6.5 - 8.1 g/dL Final  . Albumin 01/27/2018 4.1  3.5 - 5.0 g/dL Final  . AST 01/27/2018 17  15 - 41 U/L Final  . ALT 01/27/2018 12  0 - 44 U/L Final  . Alkaline Phosphatase 01/27/2018 45  38 - 126 U/L Final  .  Total Bilirubin 01/27/2018 0.7  0.3 - 1.2 mg/dL Final  . GFR calc non Af Amer 01/27/2018 >60  >60 mL/min Final  . GFR calc Af Amer 01/27/2018 >60  >60 mL/min Final   Comment: (NOTE) The eGFR has been calculated using the CKD EPI equation. This calculation has not been validated in all clinical situations. eGFR's persistently <60 mL/min signify possible Chronic Kidney Disease.   Georgiann Hahn gap 01/27/2018 8  5 - 15 Final   Performed at [facility], [place] [address], [place], [place] [address]  . Prothrombin Time 01/27/2018 13.8  11.4 - 15.2 seconds Final  . INR 01/27/2018 1.07   Final   Performed at [facility], [place] [address], [place], [place] [address]  . ABO/RH(D) 01/27/2018 A POS   Final  . Antibody Screen 01/27/2018 NEG   Final  . Sample Expiration 01/27/2018 02/06/2018   Final  . Extend sample reason 01/27/2018    Final                   Value:NO TRANSFUSIONS OR PREGNANCY IN THE PAST 3 MONTHS Performed at [facility], [place] [address], [place], [place] [address]   . MRSA, PCR 01/27/2018 POSITIVE* NEGATIVE Final   Comment: RESULT CALLED TO, READ BACK BY AND VERIFIED WITH: EWURUM,I @ 0800 ON [id number] POTEAT,S   . Staphylococcus aureus 01/27/2018 POSITIVE* NEGATIVE Final   Comment: (NOTE) The Xpert SA Assay (FDA approved for NASAL specimens in patients 69 years of age and older), is one component of a comprehensive surveillance program. It is not intended to diagnose infection nor to guide or monitor treatment. Performed at [facility], [place] [address] [place],  [address]      X-Rays:Dg Pelvis Portable  Result  Date: 02/03/2018 CLINICAL DATA:  67 year old female status post left hip arthroplasty. EXAM: PORTABLE PELVIS 1-2 VIEWS COMPARISON:  Intraoperative images 1607 hours today. Pelvis series 02/11/2011. FINDINGS: Portable AP view at 1652 hours. Left bipolar type hip arthroplasty is in place. The hardware appears intact and normally aligned in the AP projection. Postoperative drain is in place with surrounding postoperative soft tissue changes. No unexpected osseous changes. Chronic sacral stimulator device. IMPRESSION: Left bipolar hip arthroplasty with no adverse features. Electronically Signed   By: Genevie Ann M.D.   On: 02/03/2018 17:59   Dg C-arm 1-60 Min-no Report  Result Date: 02/03/2018 Fluoroscopy was utilized by the requesting physician.  No radiographic interpretation.    EKG: Orders placed or performed during the hospital encounter of 02/28/11  . EKG  . EKG     Hospital Course: Patient was admitted to [facility] and taken to the OR and underwent the above state procedure without complications.  Patient tolerated the procedure well and was later transferred to the recovery room and then to the orthopaedic floor for postoperative care.  They were given PO and IV analgesics for pain control following their surgery.  They were given 24 hours of postoperative antibiotics of  Anti-infectives (From admission, onward)   Start     Dose/Rate Route Frequency Ordered Stop   02/04/18 0300  vancomycin (VANCOCIN) IVPB 1000 mg/200 mL premix     1,000 mg 200 mL/hr over 60 Minutes Intravenous Every 12 hours 02/03/18 1804 02/04/18 0431   02/03/18 1145  ceFAZolin (ANCEF) IVPB 2g/100 mL premix     2 g 200 mL/hr over 30 Minutes Intravenous On call to O.R. 02/03/18 1144 02/03/18 1501  02/03/18 1145  vancomycin (VANCOCIN) IVPB 1000 mg/200 mL premix  Status:  Discontinued     1,000 mg 200 mL/hr over 60 Minutes Intravenous  Once 02/03/18 1143 02/03/18 1148   02/03/18 1143  vancomycin (VANCOCIN) IVPB  1000 mg/200 mL premix     1,000 mg 200 mL/hr over 60 Minutes Intravenous 60 min pre-op 02/03/18 1143 02/03/18 1447     and started on DVT prophylaxis in the form of Aspirin.   PT and OT were ordered for total hip protocol.  The patient was allowed to be WBAT with therapy. Discharge planning was consulted to help with postop disposition and equipment needs.  Patient had a fair night on the evening of surgery.  They started to get up OOB with therapy on day one.  Hemovac drain was pulled without difficulty.  Continued to work with therapy into day two.  Dressing was changed on day two and the incision was clean and dry.  The patient had progressed with therapy and meeting their goals.  Incision was healing well.  Patient was seen in rounds and was ready to go home.  Diet: Cardiac diet Activity:WBAT Follow-up:in 2 weeks Disposition - Home Discharged Condition: stable   Discharge Instructions    Call MD / Call 911   Complete by:  As directed    If you experience chest pain or shortness of breath, CALL 911 and be transported to the hospital emergency room.  If you develope a fever above 101 F, pus (white drainage) or increased drainage or redness at the wound, or calf pain, call your surgeon's office.   Change dressing   Complete by:  As directed    You may change your dressing on Friday (02/05/2018), then change the dressing daily with sterile 4 x 4 inch gauze dressing and paper tape.   Constipation Prevention   Complete by:  As directed    Drink plenty of fluids.  Prune juice may be helpful.  You may use a stool softener, such as Colace (over the counter) 100 mg twice a day.  Use MiraLax (over the counter) for constipation as needed.   Diet - low sodium heart healthy   Complete by:  As directed    Discharge instructions   Complete by:  As directed    Dr. Gaynelle Arabian Total Joint Specialist [facility] [address] [place], [place] [address] [phone]  ANTERIOR  APPROACH TOTAL HIP REPLACEMENT POSTOPERATIVE DIRECTIONS   Hip Rehabilitation, Guidelines Following Surgery  The results of a hip operation are greatly improved after range of motion and muscle strengthening exercises. Follow all safety measures which are given to protect your hip. If any of these exercises cause increased pain or swelling in your joint, decrease the amount until you are comfortable again. Then slowly increase the exercises. Call your caregiver if you have problems or questions.   HOME CARE INSTRUCTIONS  Remove items at home which could result in a fall. This includes throw rugs or furniture in walking pathways.  ICE to the affected hip every three hours for 30 minutes at a time and then as needed for pain and swelling.  Continue to use ice on the hip for pain and swelling from surgery. You may notice swelling that will progress down to the foot and ankle.  This is normal after surgery.  Elevate the leg when you are not up walking on it.   Continue to use the breathing machine which will help keep your temperature down.  It is common for your temperature to cycle up and down following surgery, especially at night when you are not up moving around and exerting yourself.  The breathing machine keeps your lungs expanded and your temperature down.  DIET You may resume your previous home diet once your are discharged from the hospital.  DRESSING / WOUND CARE / SHOWERING You may shower 3 days after surgery, but keep the wounds dry during showering.  You may use an occlusive plastic wrap (Press'n Seal for example), NO SOAKING/SUBMERGING IN THE BATHTUB.  If the bandage gets wet, change with a clean dry gauze.  If the incision gets wet, pat the wound dry with a clean towel. You may start showering once you are discharged home but do not submerge the incision under water. Just pat the incision dry and apply a dry gauze dressing on daily. Change the surgical dressing daily and reapply a dry  dressing each time.  ACTIVITY Walk with your walker as instructed. Use walker as long as suggested by your caregivers. Avoid periods of inactivity such as sitting longer than an hour when not asleep. This helps prevent blood clots.  You may resume a sexual relationship in one month or when given the OK by your doctor.  You may return to work once you are cleared by your doctor.  Do not drive a car for 6 weeks or until released by you surgeon.  Do not drive while taking narcotics.  WEIGHT BEARING Weight bearing as tolerated with assist device (walker, cane, etc) as directed, use it as long as suggested by your surgeon or therapist, typically at least 4-6 weeks.  POSTOPERATIVE CONSTIPATION PROTOCOL Constipation - defined medically as fewer than three stools per week and severe constipation as less than one stool per week.  One of the most common issues patients have following surgery is constipation.  Even if you have a regular bowel pattern at home, your normal regimen is likely to be disrupted due to multiple reasons following surgery.  Combination of anesthesia, postoperative narcotics, change in appetite and fluid intake all can affect your bowels.  In order to avoid complications following surgery, here are some recommendations in order to help you during your recovery period.  Colace (docusate) - Pick up an over-the-counter form of Colace or another stool softener and take twice a day as long as you are requiring postoperative pain medications.  Take with a full glass of water daily.  If you experience loose stools or diarrhea, hold the colace until you stool forms back up.  If your symptoms do not get better within 1 week or if they get worse, check with your doctor.  Dulcolax (bisacodyl) - Pick up over-the-counter and take as directed by the product packaging as needed to assist with the movement of your bowels.  Take with a full glass of water.  Use this product as needed if not relieved  by Colace only.   MiraLax (polyethylene glycol) - Pick up over-the-counter to have on hand.  MiraLax is a solution that will increase the amount of water in your bowels to assist with bowel movements.  Take as directed and can mix with a glass of water, juice, soda, coffee, or tea.  Take if you go more than two days without a movement. Do not use MiraLax more than once per day. Call your doctor if you are still constipated or irregular after using this medication for 7 days in a row.  If you continue to have  problems with postoperative constipation, please contact the office for further assistance and recommendations.  If you experience "the worst abdominal pain ever" or develop nausea or vomiting, please contact the office immediatly for further recommendations for treatment.  ITCHING  If you experience itching with your medications, try taking only a single pain pill, or even half a pain pill at a time.  You can also use Benadryl over the counter for itching or also to help with sleep.   TED HOSE STOCKINGS Wear the elastic stockings on both legs for three weeks following surgery during the day but you may remove then at night for sleeping.  MEDICATIONS See your medication summary on the "After Visit Summary" that the nursing staff will review with you prior to discharge.  You may have some home medications which will be placed on hold until you complete the course of blood thinner medication.  It is important for you to complete the blood thinner medication as prescribed by your surgeon.  Continue your approved medications as instructed at time of discharge.  PRECAUTIONS If you experience chest pain or shortness of breath - call 911 immediately for transfer to the hospital emergency department.  If you develop a fever greater that 101 F, purulent drainage from wound, increased redness or drainage from wound, foul odor from the wound/dressing, or calf pain - CONTACT YOUR SURGEON.                                                    FOLLOW-UP APPOINTMENTS Make sure you keep all of your appointments after your operation with your surgeon and caregivers. You should call the office at the above phone number and make an appointment for approximately two weeks after the date of your surgery or on the date instructed by your surgeon outlined in the "After Visit Summary".  RANGE OF MOTION AND STRENGTHENING EXERCISES  These exercises are designed to help you keep full movement of your hip joint. Follow your caregiver's or physical therapist's instructions. Perform all exercises about fifteen times, three times per day or as directed. Exercise both hips, even if you have had only one joint replacement. These exercises can be done on a training (exercise) mat, on the floor, on a table or on a bed. Use whatever works the best and is most comfortable for you. Use music or television while you are exercising so that the exercises are a pleasant break in your day. This will make your life better with the exercises acting as a break in routine you can look forward to.  Lying on your back, slowly slide your foot toward your buttocks, raising your knee up off the floor. Then slowly slide your foot back down until your leg is straight again.  Lying on your back spread your legs as far apart as you can without causing discomfort.  Lying on your side, raise your upper leg and foot straight up from the floor as far as is comfortable. Slowly lower the leg and repeat.  Lying on your back, tighten up the muscle in the front of your thigh (quadriceps muscles). You can do this by keeping your leg straight and trying to raise your heel off the floor. This helps strengthen the largest muscle supporting your knee.  Lying on your back, tighten up the muscles of your buttocks both with  the legs straight and with the knee bent at a comfortable angle while keeping your heel on the floor.   IF YOU ARE TRANSFERRED TO A SKILLED REHAB  FACILITY If the patient is transferred to a skilled rehab facility following release from the hospital, a list of the current medications will be sent to the facility for the patient to continue.  When discharged from the skilled rehab facility, please have the facility set up the patient's [place] prior to being released. Also, the skilled facility will be responsible for providing the patient with their medications at time of release from the facility to include their pain medication, the muscle relaxants, and their blood thinner medication. If the patient is still at the rehab facility at time of the two week follow up appointment, the skilled rehab facility will also need to assist the patient in arranging follow up appointment in our office and any transportation needs.  MAKE SURE YOU:  Understand these instructions.  Get help right away if you are not doing well or get worse.    Pick up stool softner and laxative for home use following surgery while on pain medications. Do not submerge incision under water. Please use good hand washing techniques while changing dressing each day. May shower starting three days after surgery. Please use a clean towel to pat the incision dry following showers. Continue to use ice for pain and swelling after surgery. Do not use any lotions or creams on the incision until instructed by your surgeon.   Do not sit on low chairs, stoools or toilet seats, as it may be difficult to get up from low surfaces   Complete by:  As directed    Driving restrictions   Complete by:  As directed    No driving for two weeks   TED hose   Complete by:  As directed    Use stockings (TED hose) for three weeks on both leg(s).  You may remove them at night for sleeping.   Weight bearing as tolerated   Complete by:  As directed      Allergies as of 02/05/2018      Reactions   Penicillins Itching   Has patient had a PCN reaction causing immediate rash,  facial/tongue/throat swelling, SOB or lightheadedness with hypotension: No itching only Has patient had a PCN reaction causing severe rash involving mucus membranes or skin necrosis: No Has patient had a PCN reaction that required hospitalization: No Has patient had a PCN reaction occurring within the last 10 years: No If all of the above answers are "NO", then may proceed with Cephalosporin use.      Medication List    STOP taking these medications   diclofenac sodium 1 % Gel Commonly known as:  VOLTAREN   HYDROcodone-acetaminophen 7.5-325 MG tablet Commonly known as:  NORCO Replaced by:  HYDROcodone-acetaminophen 5-325 MG tablet     TAKE these medications   aspirin 325 MG EC tablet Take 1 tablet (325 mg total) by mouth 2 (two) times daily for 20 days. Take one tablet (325 mg) Aspirin two times a day for three weeks following surgery. Then take one baby Aspirin (81 mg) once a day for three weeks. Then discontinue aspirin.   CALCIUM + VITAMIN D3 PO Take 1 tablet by mouth 2 (two) times daily.   cyclobenzaprine 10 MG tablet Commonly known as:  FLEXERIL Take 1 tablet (10 mg total) by mouth 3 (three) times daily as needed for muscle  spasms.   DULoxetine 30 MG capsule Commonly known as:  CYMBALTA Take 30 mg by mouth 2 (two) times daily.   folic acid 1 MG tablet Commonly known as:  FOLVITE Take 1 mg by mouth daily.   HYDROcodone-acetaminophen 5-325 MG tablet Commonly known as:  NORCO/VICODIN Take 1-2 tablets by mouth every 6 (six) hours as needed for moderate pain (pain score 4-6). Replaces:  HYDROcodone-acetaminophen 7.5-325 MG tablet   magic mouthwash w/lidocaine Soln Take 5 mLs by mouth 3 (three) times daily as needed.   Melatonin 5 MG Tabs Take 5 mg by mouth at bedtime.   methocarbamol 500 MG tablet Commonly known as:  ROBAXIN Take 1 tablet (500 mg total) by mouth every 6 (six) hours as needed for muscle spasms.   nortriptyline 50 MG capsule Commonly known as:   PAMELOR TAKE 2 CAPSULES BY MOUTH AT BEDTIME What changed:  how much to take   omeprazole 20 MG capsule Commonly known as:  PRILOSEC Take 20 mg by mouth daily.   potassium chloride 8 MEQ tablet Commonly known as:  KLOR-CON Take 8 mEq by mouth daily.   predniSONE 5 MG tablet Commonly known as:  DELTASONE TAKE 1 TABLET BY MOUTH DAILY WITH BREAKFAST   pregabalin 200 MG capsule Commonly known as:  LYRICA TAKE 1 CAPSULE BY MOUTH TWICE DAILY  *90 day supply* What changed:    how much to take  how to take this  when to take this  additional instructions   PROBIOTIC FORMULA Caps Take 1 capsule by mouth daily.   TURMERIC PO Take 1 capsule by mouth daily.   vitamin B-12 1000 MCG tablet Commonly known as:  CYANOCOBALAMIN Take 1,000 mcg by mouth every other day.   vitamin C 1000 MG tablet Take 1,000 mg by mouth daily.   zinc gluconate 50 MG tablet Take 50 mg by mouth daily.            Discharge Care Instructions  (From admission, onward)         Start     Ordered   02/04/18 0000  Weight bearing as tolerated     02/04/18 0731   02/04/18 0000  Change dressing    Comments:  You may change your dressing on Friday (02/05/2018), then change the dressing daily with sterile 4 x 4 inch gauze dressing and paper tape.   02/04/18 0731         Follow-up Information    Gaynelle Arabian, MD. Schedule an appointment as soon as possible for a visit on 02/18/2018.   Specialty:  Orthopedic Surgery Contact information: [address] [place] [address] [phone]           Signed: Ardeen Jourdain, PA-C Orthopaedic Surgery 02/09/2018, 11:29 AM

## 2020-11-13 ENCOUNTER — Other Ambulatory Visit: Payer: Self-pay | Admitting: Adult Health

## 2020-11-13 DIAGNOSIS — M359 Systemic involvement of connective tissue, unspecified: Secondary | ICD-10-CM

## 2020-11-13 NOTE — Progress Notes (Signed)
I connected by phone with [name] on 11/13/2020, 1:38 PM to discuss the potential use of a new treatment, tixagevimab/cilgavimab, for pre-exposure prophylaxis for prevention of coronavirus disease 2019 (COVID-19) caused by the SARS-CoV-2 virus.  This patient is a 70 y.o. female that meets the FDA criteria for Emergency Use Authorization of tixagevimab/cilgavimab for pre-exposure prophylaxis of COVID-19 disease. Pt meets following criteria:  Age >12 yr and weight > 40kg  Not currently infected with SARS-CoV-2 and has no known recent exposure to an individual infected with SARS-CoV-2 AND o Who has moderate to severe immune compromise due to a medical condition or receipt of immunosuppressive medications or treatments and may not mount an adequate immune response to COVID-19 vaccination or  o Vaccination with any available COVID-19 vaccine, according to the approved or authorized schedule, is not recommended due to a history of severe adverse reaction (e.g., severe allergic reaction) to a COVID-19 vaccine(s) and/or COVID-19 vaccine component(s).  o Patient meets the following definition of mod-severe immune compromised status: 8. 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 autoimmune neutropenia  I have spoken and communicated the following to the patient or parent/caregiver regarding COVID monoclonal antibody treatment:  1. FDA has authorized the emergency use of tixagevimab/cilgavimab for the pre-exposure prophylaxis of COVID-19 in patients with moderate-severe immunocompromised status, who meet above EUA criteria.  2. The significant known and potential risks and benefits of COVID monoclonal antibody, and the extent to which such potential risks and benefits are unknown.  3. Information on available alternative treatments and the risks and benefits of those alternatives, including clinical trials.  4. The patient or parent/caregiver has the option to accept or refuse COVID monoclonal  antibody treatment.  After reviewing this information with the patient, agree to receive tixagevimab/cilgavimab.  Schedule message sent.   Scot Dock, NP, 11/13/2020, 1:38 PM

## 2020-11-15 ENCOUNTER — Ambulatory Visit: Payer: Medicare Other
